# Patient Record
Sex: Male | Born: 1945
Health system: Southern US, Community
[De-identification: ages and names within clinical notes are randomized; demographics above are authoritative.]

## PROBLEM LIST (undated history)

## (undated) DIAGNOSIS — J449 Chronic obstructive pulmonary disease, unspecified: Secondary | ICD-10-CM

## (undated) DIAGNOSIS — E119 Type 2 diabetes mellitus without complications: Secondary | ICD-10-CM

## (undated) DIAGNOSIS — J439 Emphysema, unspecified: Secondary | ICD-10-CM

## (undated) DIAGNOSIS — N400 Enlarged prostate without lower urinary tract symptoms: Secondary | ICD-10-CM

## (undated) DIAGNOSIS — L570 Actinic keratosis: Secondary | ICD-10-CM

## (undated) DIAGNOSIS — D509 Iron deficiency anemia, unspecified: Secondary | ICD-10-CM

## (undated) DIAGNOSIS — E1121 Type 2 diabetes mellitus with diabetic nephropathy: Secondary | ICD-10-CM

## (undated) DIAGNOSIS — J9611 Chronic respiratory failure with hypoxia: Secondary | ICD-10-CM

## (undated) DIAGNOSIS — F419 Anxiety disorder, unspecified: Secondary | ICD-10-CM

## (undated) DIAGNOSIS — Z8489 Family history of other specified conditions: Secondary | ICD-10-CM

## (undated) DIAGNOSIS — J42 Unspecified chronic bronchitis: Secondary | ICD-10-CM

## (undated) DIAGNOSIS — I10 Essential (primary) hypertension: Secondary | ICD-10-CM

## (undated) DIAGNOSIS — K219 Gastro-esophageal reflux disease without esophagitis: Secondary | ICD-10-CM

## (undated) DIAGNOSIS — G4700 Insomnia, unspecified: Secondary | ICD-10-CM

## (undated) DIAGNOSIS — R0902 Hypoxemia: Secondary | ICD-10-CM

## (undated) DIAGNOSIS — E782 Mixed hyperlipidemia: Secondary | ICD-10-CM

## (undated) HISTORY — DX: Hypoxemia: R09.02

## (undated) HISTORY — PX: CHOLECYSTECTOMY: SHX55

## (undated) HISTORY — PX: CARDIAC CATHETERIZATION: SHX172

## (undated) HISTORY — PX: HERNIA REPAIR: SHX51

---

## 2014-03-25 ENCOUNTER — Emergency Department: Payer: Self-pay | Admitting: Emergency Medicine

## 2014-03-25 LAB — COMPREHENSIVE METABOLIC PANEL
ALK PHOS: 87 U/L
ANION GAP: 7 (ref 7–16)
AST: 39 U/L — AB (ref 15–37)
Albumin: 3.7 g/dL (ref 3.4–5.0)
BUN: 10 mg/dL (ref 7–18)
Bilirubin,Total: 0.6 mg/dL (ref 0.2–1.0)
CREATININE: 1.24 mg/dL (ref 0.60–1.30)
Calcium, Total: 9.1 mg/dL (ref 8.5–10.1)
Chloride: 98 mmol/L (ref 98–107)
Co2: 26 mmol/L (ref 21–32)
Glucose: 173 mg/dL — ABNORMAL HIGH (ref 65–99)
Osmolality: 266 (ref 275–301)
POTASSIUM: 4.3 mmol/L (ref 3.5–5.1)
SGPT (ALT): 33 U/L (ref 12–78)
SODIUM: 131 mmol/L — AB (ref 136–145)
Total Protein: 8.1 g/dL (ref 6.4–8.2)

## 2014-03-25 LAB — CBC WITH DIFFERENTIAL/PLATELET
Basophil #: 0 10*3/uL (ref 0.0–0.1)
Basophil %: 0.4 %
EOS ABS: 0 10*3/uL (ref 0.0–0.7)
Eosinophil %: 0.3 %
HCT: 47.1 % (ref 40.0–52.0)
HGB: 15.6 g/dL (ref 13.0–18.0)
LYMPHS ABS: 0.8 10*3/uL — AB (ref 1.0–3.6)
Lymphocyte %: 9.2 %
MCH: 27.8 pg (ref 26.0–34.0)
MCHC: 33 g/dL (ref 32.0–36.0)
MCV: 84 fL (ref 80–100)
MONOS PCT: 10.5 %
Monocyte #: 1 x10 3/mm (ref 0.2–1.0)
NEUTROS ABS: 7.3 10*3/uL — AB (ref 1.4–6.5)
NEUTROS PCT: 79.6 %
PLATELETS: 202 10*3/uL (ref 150–440)
RBC: 5.59 10*6/uL (ref 4.40–5.90)
RDW: 14.4 % (ref 11.5–14.5)
WBC: 9.2 10*3/uL (ref 3.8–10.6)

## 2014-03-25 LAB — TROPONIN I: Troponin-I: <0.02 ng/mL

## 2014-03-26 LAB — URINALYSIS, COMPLETE
BACTERIA: NONE SEEN
Bilirubin,UR: NEGATIVE
Blood: NEGATIVE
Glucose,UR: NEGATIVE mg/dL (ref 0–75)
KETONE: NEGATIVE
Leukocyte Esterase: NEGATIVE
NITRITE: NEGATIVE
PH: 6 (ref 4.5–8.0)
Protein: 100
RBC,UR: <1 /HPF (ref 0–5)
Specific Gravity: 1.017 (ref 1.003–1.030)
Squamous Epithelial: NONE SEEN
WBC UR: <1 /HPF (ref 0–5)

## 2014-03-29 ENCOUNTER — Emergency Department: Payer: Self-pay | Admitting: Emergency Medicine

## 2014-03-29 LAB — CBC WITH DIFFERENTIAL/PLATELET
BASOS ABS: 0 10*3/uL (ref 0.0–0.1)
BASOS PCT: 1 %
EOS ABS: 0.1 10*3/uL (ref 0.0–0.7)
Eosinophil %: 1.3 %
HCT: 45.9 % (ref 40.0–52.0)
HGB: 15.7 g/dL (ref 13.0–18.0)
LYMPHS ABS: 1.2 10*3/uL (ref 1.0–3.6)
Lymphocyte %: 23.6 %
MCH: 28.6 pg (ref 26.0–34.0)
MCHC: 34.1 g/dL (ref 32.0–36.0)
MCV: 84 fL (ref 80–100)
Monocyte #: 0.6 x10 3/mm (ref 0.2–1.0)
Monocyte %: 11.6 %
NEUTROS ABS: 3.2 10*3/uL (ref 1.4–6.5)
Neutrophil %: 62.5 %
PLATELETS: 182 10*3/uL (ref 150–440)
RBC: 5.48 10*6/uL (ref 4.40–5.90)
RDW: 14.2 % (ref 11.5–14.5)
WBC: 5.1 10*3/uL (ref 3.8–10.6)

## 2014-03-29 LAB — COMPREHENSIVE METABOLIC PANEL
Albumin: 3.4 g/dL (ref 3.4–5.0)
Alkaline Phosphatase: 77 U/L
Anion Gap: 6 — ABNORMAL LOW (ref 7–16)
BILIRUBIN TOTAL: 0.3 mg/dL (ref 0.2–1.0)
BUN: 23 mg/dL — ABNORMAL HIGH (ref 7–18)
CHLORIDE: 95 mmol/L — AB (ref 98–107)
CREATININE: 2.2 mg/dL — AB (ref 0.60–1.30)
Calcium, Total: 9.2 mg/dL (ref 8.5–10.1)
Co2: 27 mmol/L (ref 21–32)
EGFR (African American): 35 — ABNORMAL LOW
GFR CALC NON AF AMER: 30 — AB
GLUCOSE: 92 mg/dL (ref 65–99)
OSMOLALITY: 260 (ref 275–301)
Potassium: 3.5 mmol/L (ref 3.5–5.1)
SGOT(AST): 40 U/L — ABNORMAL HIGH (ref 15–37)
SGPT (ALT): 32 U/L (ref 12–78)
SODIUM: 128 mmol/L — AB (ref 136–145)
TOTAL PROTEIN: 8.5 g/dL — AB (ref 6.4–8.2)

## 2014-03-29 LAB — URINALYSIS, COMPLETE
Bilirubin,UR: NEGATIVE
Blood: NEGATIVE
GLUCOSE, UR: NEGATIVE mg/dL (ref 0–75)
Hyaline Cast: 2
KETONE: NEGATIVE
Leukocyte Esterase: NEGATIVE
Nitrite: NEGATIVE
Ph: 5 (ref 4.5–8.0)
SPECIFIC GRAVITY: 1.028 (ref 1.003–1.030)
SQUAMOUS EPITHELIAL: NONE SEEN
WBC UR: 4 /HPF (ref 0–5)

## 2014-03-29 LAB — TROPONIN I: Troponin-I: <0.02 ng/mL

## 2014-04-24 DIAGNOSIS — N181 Chronic kidney disease, stage 1: Secondary | ICD-10-CM | POA: Insufficient documentation

## 2014-08-30 ENCOUNTER — Ambulatory Visit: Payer: Self-pay

## 2014-09-18 ENCOUNTER — Ambulatory Visit: Payer: Self-pay

## 2014-10-19 ENCOUNTER — Ambulatory Visit: Payer: Self-pay

## 2014-11-19 ENCOUNTER — Ambulatory Visit: Payer: Self-pay

## 2014-12-18 ENCOUNTER — Ambulatory Visit: Admit: 2014-12-18 | Disposition: A | Discharge status: Home / Self Care | Payer: Self-pay

## 2015-07-26 ENCOUNTER — Encounter: Payer: Self-pay | Admitting: *Deleted

## 2015-12-16 DIAGNOSIS — N401 Enlarged prostate with lower urinary tract symptoms: Secondary | ICD-10-CM

## 2015-12-16 DIAGNOSIS — N138 Other obstructive and reflux uropathy: Secondary | ICD-10-CM | POA: Insufficient documentation

## 2016-03-01 ENCOUNTER — Emergency Department
Admission: EM | Admit: 2016-03-01 | Discharge: 2016-03-01 | Disposition: A | Discharge status: Home / Self Care | Payer: Medicare Other | Attending: Emergency Medicine | Admitting: Emergency Medicine

## 2016-03-01 ENCOUNTER — Encounter: Payer: Self-pay | Admitting: Emergency Medicine

## 2016-03-01 ENCOUNTER — Emergency Department: Payer: Medicare Other

## 2016-03-01 DIAGNOSIS — R05 Cough: Secondary | ICD-10-CM | POA: Diagnosis present

## 2016-03-01 DIAGNOSIS — J449 Chronic obstructive pulmonary disease, unspecified: Secondary | ICD-10-CM | POA: Diagnosis not present

## 2016-03-01 DIAGNOSIS — J189 Pneumonia, unspecified organism: Secondary | ICD-10-CM | POA: Diagnosis not present

## 2016-03-01 DIAGNOSIS — E119 Type 2 diabetes mellitus without complications: Secondary | ICD-10-CM | POA: Insufficient documentation

## 2016-03-01 HISTORY — DX: Type 2 diabetes mellitus without complications: E11.9

## 2016-03-01 HISTORY — DX: Chronic obstructive pulmonary disease, unspecified: J44.9

## 2016-03-01 LAB — BASIC METABOLIC PANEL
Anion gap: 11 (ref 5–15)
BUN: 11 mg/dL (ref 6–20)
CALCIUM: 8.9 mg/dL (ref 8.9–10.3)
CO2: 26 mmol/L (ref 22–32)
CREATININE: 1.03 mg/dL (ref 0.61–1.24)
Chloride: 92 mmol/L — ABNORMAL LOW (ref 101–111)
GFR calc Af Amer: >60 mL/min (ref >60)
GFR calc non Af Amer: >60 mL/min (ref >60)
GLUCOSE: 235 mg/dL — AB (ref 65–99)
Potassium: 4 mmol/L (ref 3.5–5.1)
Sodium: 129 mmol/L — ABNORMAL LOW (ref 135–145)

## 2016-03-01 LAB — TROPONIN I

## 2016-03-01 LAB — CBC
HCT: 43.2 % (ref 40.0–52.0)
HEMOGLOBIN: 14.6 g/dL (ref 13.0–18.0)
MCH: 26.9 pg (ref 26.0–34.0)
MCHC: 33.8 g/dL (ref 32.0–36.0)
MCV: 79.5 fL — ABNORMAL LOW (ref 80.0–100.0)
PLATELETS: 204 10*3/uL (ref 150–440)
RBC: 5.43 MIL/uL (ref 4.40–5.90)
RDW: 14.4 % (ref 11.5–14.5)
WBC: 11.4 10*3/uL — ABNORMAL HIGH (ref 3.8–10.6)

## 2016-03-01 MED ORDER — AZITHROMYCIN 250 MG PO TABS: ORAL_TABLET | ORAL | Status: AC

## 2016-03-01 MED ORDER — AZITHROMYCIN 500 MG PO TABS
500.0000 mg | ORAL_TABLET | Freq: Once | ORAL | Status: AC
  Administered 2016-03-01: 500 mg via ORAL
  Filled 2016-03-01: qty 1

## 2016-03-01 MED ORDER — ALBUTEROL SULFATE (2.5 MG/3ML) 0.083% IN NEBU: 2.5000 mg | INHALATION_SOLUTION | RESPIRATORY_TRACT | Status: DC | PRN

## 2016-03-01 NOTE — ED Notes (Signed)
Patient arrives to [facility] ED with complaint of chest pain/pressure with shortness of breath. Patient c/o nasal congestion with productive green sputum cough. Patient actively expectorating in triage

## 2016-03-01 NOTE — ED Notes (Signed)
Pt to xray

## 2016-03-01 NOTE — Discharge Instructions (Signed)

## 2016-03-01 NOTE — ED Provider Notes (Signed)
[facility] Emergency Department Provider Note   ____________________________________________  Time seen: Approximately 445 PM  I have reviewed the triage vital signs and the nursing notes.   HISTORY  Chief Complaint Chest Pain; Cough; and Nasal Congestion   HPI Billy Wise is a 70 y.o. male with a history of COPD and diabetes who is presenting to the emergency department today with 2 days of worsening cough, nasal congestion and a fever yesterday. He says he has not had any fevers today. Denies any shortness of breath. Says that he has no longer smoking. Has had a pneumonia in the past which she took in about [facility] which resolved. He says that his wife is also sick with upper respiratory infection about one week ago. He says he is also having central chest pain which feels like is a pressure type pain and is moderate and constant. He does not report any radiation of the pain. Says that he has also had several episodes of posttussive emesis.Denies being admitted to the hospital over the past 3 months.   Past Medical History  Diagnosis Date  . COPD (chronic obstructive pulmonary disease) (HCC)   . Diabetes mellitus without complication (HCC)     There are no active problems to display for this patient.   History reviewed. No pertinent past surgical history.  No current outpatient prescriptions on file.  Allergies Review of patient's allergies indicates no known allergies.  History reviewed. No pertinent family history.  Social History Social History  Substance Use Topics  . Smoking status: Never Smoker   . Smokeless tobacco: None  . Alcohol Use: None    Review of Systems Constitutional: Fever yesterday Eyes: No visual changes. ENT: No sore throat. Cardiovascular: Denies chest pain. Respiratory: Cough and nasal congestion Gastrointestinal: No diarrhea.  No constipation. Genitourinary: Negative for dysuria. Musculoskeletal:  Negative for back pain. Skin: Negative for rash. Neurological: Negative for headaches, focal weakness or numbness.  10-point ROS otherwise negative.  ____________________________________________   PHYSICAL EXAM:  VITAL SIGNS: ED Triage Vitals  Enc Vitals Group     BP 03/01/16 1553 118/72 mmHg     Pulse Rate 03/01/16 1553 94     Resp --      Temp 03/01/16 1553 98.9 F (37.2 C)     Temp Source 03/01/16 1553 Oral     SpO2 03/01/16 1553 97 %     Weight 03/01/16 1553 180 lb (81.647 kg)     Height 03/01/16 1553 5\' 6"  (1.676 m)     Head Cir --      Peak Flow --      Pain Score 03/01/16 1554 9     Pain Loc --      Pain Edu? --      Excl. in GC? --     Constitutional: Alert and oriented. Well appearing and in no acute distress. Eyes: Conjunctivae are normal. PERRL. EOMI. Head: Atraumatic. Nose: No congestion/rhinnorhea. Mouth/Throat: Mucous membranes are moist.  Oropharynx non-erythematous. Neck: No stridor.   Cardiovascular: Normal rate, regular rhythm. Grossly normal heart sounds.  Good peripheral circulation.Chest pain is reproducible over the sternum. Respiratory: Normal respiratory effort.  No retractions. Mild rales to the left mid field. Gastrointestinal: Soft with mild tenderness diffusely. no CVA tenderness. Musculoskeletal: No lower extremity tenderness nor edema.  No joint effusions. Neurologic:  Normal speech and language. No gross focal neurologic deficits are appreciated.  Skin:  Skin is warm, dry and intact. No rash noted.  Psychiatric: Mood and affect are normal. Speech and behavior are normal.  ____________________________________________   LABS (all labs ordered are listed, but only abnormal results are displayed)  Labs Reviewed  BASIC METABOLIC PANEL - Abnormal; Notable for the following:    Sodium 129 (*)    Chloride 92 (*)    Glucose, Bld 235 (*)    All other components within normal limits  CBC - Abnormal; Notable for the following:    WBC 11.4 (*)     MCV 79.5 (*)    All other components within normal limits  TROPONIN I   ____________________________________________  EKG  ED ECG REPORT I, Arelia Longest, the attending physician, personally viewed and interpreted this ECG.   Date: 03/01/2016  EKG Time: 1545  Rate: 94  Rhythm: normal sinus rhythm  Axis: Left axis deviation  Intervals:none  ST&T Change: No ST segment elevation or depression. No abnormal T-wave inversion.  ____________________________________________  RADIOLOGY  DG Chest 2 View (Final result) Result time: 03/01/16 16:16:40   Final result by Rad Results In Interface (03/01/16 16:16:40)   Narrative:   CLINICAL DATA: Productive cough, nonsmoker, fever  EXAM: CHEST 2 VIEW  COMPARISON: None.  FINDINGS: There is hazy lingular airspace disease which may reflect atelectasis versus pneumonia. There is no focal parenchymal opacity. There is no pleural effusion or pneumothorax. The heart and mediastinal contours are unremarkable.  The osseous structures are unremarkable.  IMPRESSION: Hazy lingular airspace disease which may reflect atelectasis versus pneumonia.   Electronically Signed By: Elige Ko    ____________________________________________   PROCEDURES   ____________________________________________   INITIAL IMPRESSION / ASSESSMENT AND PLAN / ED COURSE  Pertinent labs & imaging results that were available during my care of the patient were reviewed by me and considered in my medical decision making (see chart for details).  We'll give patient antibiotics for his pneumonia. Patient says he has nebulizer machine at home but he says his Nebules may be out of date. We'll prescribe albuterol Nebules for home use as needed. Also discussed return precautions including any worsening symptoms such as return of fever or shortness of breath. The patient understands these return precautions and is willing to comply. Will be discharged  home. ____________________________________________   FINAL CLINICAL IMPRESSION(S) / ED DIAGNOSES  Community acquired pneumonia.    NEW MEDICATIONS STARTED DURING THIS VISIT:  New Prescriptions   No medications on file     Note:  This document was prepared using Dragon voice recognition software and may include unintentional dictation errors.    Myrna Blazer, MD 03/01/16 [phone]

## 2016-03-23 LAB — HM HEPATITIS C SCREENING LAB: HM HEPATITIS C SCREENING: NEGATIVE

## 2016-09-03 DIAGNOSIS — G4700 Insomnia, unspecified: Secondary | ICD-10-CM | POA: Insufficient documentation

## 2017-10-28 ENCOUNTER — Other Ambulatory Visit: Payer: Self-pay

## 2017-10-28 ENCOUNTER — Encounter: Payer: Self-pay | Admitting: Emergency Medicine

## 2017-10-28 ENCOUNTER — Inpatient Hospital Stay
Admission: EM | Admit: 2017-10-28 | Discharge: 2017-11-01 | DRG: 871 | Disposition: A | Discharge status: Home / Self Care | Payer: Medicare Other | Attending: Internal Medicine | Admitting: Internal Medicine

## 2017-10-28 ENCOUNTER — Emergency Department: Payer: Medicare Other

## 2017-10-28 DIAGNOSIS — N181 Chronic kidney disease, stage 1: Secondary | ICD-10-CM | POA: Diagnosis present

## 2017-10-28 DIAGNOSIS — G4733 Obstructive sleep apnea (adult) (pediatric): Secondary | ICD-10-CM | POA: Diagnosis present

## 2017-10-28 DIAGNOSIS — J101 Influenza due to other identified influenza virus with other respiratory manifestations: Secondary | ICD-10-CM

## 2017-10-28 DIAGNOSIS — I152 Hypertension secondary to endocrine disorders: Secondary | ICD-10-CM | POA: Diagnosis present

## 2017-10-28 DIAGNOSIS — J1008 Influenza due to other identified influenza virus with other specified pneumonia: Secondary | ICD-10-CM | POA: Diagnosis present

## 2017-10-28 DIAGNOSIS — Z8249 Family history of ischemic heart disease and other diseases of the circulatory system: Secondary | ICD-10-CM | POA: Diagnosis not present

## 2017-10-28 DIAGNOSIS — E871 Hypo-osmolality and hyponatremia: Secondary | ICD-10-CM | POA: Diagnosis present

## 2017-10-28 DIAGNOSIS — Z7951 Long term (current) use of inhaled steroids: Secondary | ICD-10-CM

## 2017-10-28 DIAGNOSIS — J441 Chronic obstructive pulmonary disease with (acute) exacerbation: Secondary | ICD-10-CM | POA: Diagnosis present

## 2017-10-28 DIAGNOSIS — I129 Hypertensive chronic kidney disease with stage 1 through stage 4 chronic kidney disease, or unspecified chronic kidney disease: Secondary | ICD-10-CM | POA: Diagnosis present

## 2017-10-28 DIAGNOSIS — J159 Unspecified bacterial pneumonia: Secondary | ICD-10-CM | POA: Diagnosis present

## 2017-10-28 DIAGNOSIS — E1159 Type 2 diabetes mellitus with other circulatory complications: Secondary | ICD-10-CM | POA: Diagnosis present

## 2017-10-28 DIAGNOSIS — R0902 Hypoxemia: Secondary | ICD-10-CM

## 2017-10-28 DIAGNOSIS — A4189 Other specified sepsis: Principal | ICD-10-CM | POA: Diagnosis present

## 2017-10-28 DIAGNOSIS — K219 Gastro-esophageal reflux disease without esophagitis: Secondary | ICD-10-CM | POA: Diagnosis present

## 2017-10-28 DIAGNOSIS — N400 Enlarged prostate without lower urinary tract symptoms: Secondary | ICD-10-CM | POA: Diagnosis present

## 2017-10-28 DIAGNOSIS — I10 Essential (primary) hypertension: Secondary | ICD-10-CM | POA: Diagnosis present

## 2017-10-28 DIAGNOSIS — Z794 Long term (current) use of insulin: Secondary | ICD-10-CM | POA: Diagnosis not present

## 2017-10-28 DIAGNOSIS — Z79899 Other long term (current) drug therapy: Secondary | ICD-10-CM | POA: Diagnosis not present

## 2017-10-28 DIAGNOSIS — E1165 Type 2 diabetes mellitus with hyperglycemia: Secondary | ICD-10-CM | POA: Diagnosis present

## 2017-10-28 DIAGNOSIS — J129 Viral pneumonia, unspecified: Secondary | ICD-10-CM | POA: Diagnosis present

## 2017-10-28 DIAGNOSIS — E1122 Type 2 diabetes mellitus with diabetic chronic kidney disease: Secondary | ICD-10-CM | POA: Diagnosis present

## 2017-10-28 DIAGNOSIS — J440 Chronic obstructive pulmonary disease with acute lower respiratory infection: Secondary | ICD-10-CM | POA: Diagnosis present

## 2017-10-28 DIAGNOSIS — A419 Sepsis, unspecified organism: Secondary | ICD-10-CM | POA: Diagnosis present

## 2017-10-28 DIAGNOSIS — E782 Mixed hyperlipidemia: Secondary | ICD-10-CM | POA: Diagnosis present

## 2017-10-28 DIAGNOSIS — J449 Chronic obstructive pulmonary disease, unspecified: Secondary | ICD-10-CM

## 2017-10-28 DIAGNOSIS — E119 Type 2 diabetes mellitus without complications: Secondary | ICD-10-CM

## 2017-10-28 DIAGNOSIS — J9601 Acute respiratory failure with hypoxia: Secondary | ICD-10-CM | POA: Diagnosis present

## 2017-10-28 DIAGNOSIS — R05 Cough: Secondary | ICD-10-CM

## 2017-10-28 DIAGNOSIS — R059 Cough, unspecified: Secondary | ICD-10-CM

## 2017-10-28 DIAGNOSIS — G4700 Insomnia, unspecified: Secondary | ICD-10-CM | POA: Diagnosis present

## 2017-10-28 DIAGNOSIS — D509 Iron deficiency anemia, unspecified: Secondary | ICD-10-CM | POA: Diagnosis present

## 2017-10-28 HISTORY — DX: Actinic keratosis: L57.0

## 2017-10-28 HISTORY — DX: Benign prostatic hyperplasia without lower urinary tract symptoms: N40.0

## 2017-10-28 HISTORY — DX: Essential (primary) hypertension: I10

## 2017-10-28 HISTORY — DX: Gastro-esophageal reflux disease without esophagitis: K21.9

## 2017-10-28 HISTORY — DX: Mixed hyperlipidemia: E78.2

## 2017-10-28 HISTORY — DX: Iron deficiency anemia, unspecified: D50.9

## 2017-10-28 HISTORY — DX: Insomnia, unspecified: G47.00

## 2017-10-28 LAB — CBC WITH DIFFERENTIAL/PLATELET
BASOS ABS: 0.1 10*3/uL (ref 0–0.1)
BASOS PCT: 1 %
Eosinophils Absolute: 0 10*3/uL (ref 0–0.7)
Eosinophils Relative: 0 %
HCT: 36.7 % — ABNORMAL LOW (ref 40.0–52.0)
HEMOGLOBIN: 11.8 g/dL — AB (ref 13.0–18.0)
LYMPHS PCT: 5 %
Lymphs Abs: 0.6 10*3/uL — ABNORMAL LOW (ref 1.0–3.6)
MCH: 25.1 pg — ABNORMAL LOW (ref 26.0–34.0)
MCHC: 32.2 g/dL (ref 32.0–36.0)
MCV: 78.1 fL — ABNORMAL LOW (ref 80.0–100.0)
Monocytes Absolute: 0.4 10*3/uL (ref 0.2–1.0)
Monocytes Relative: 3 %
Neutro Abs: 11 10*3/uL — ABNORMAL HIGH (ref 1.4–6.5)
Neutrophils Relative %: 91 %
Platelets: 338 10*3/uL (ref 150–440)
RBC: 4.7 MIL/uL (ref 4.40–5.90)
RDW: 15.8 % — ABNORMAL HIGH (ref 11.5–14.5)
WBC: 12.2 10*3/uL — AB (ref 3.8–10.6)

## 2017-10-28 LAB — LACTIC ACID, PLASMA: Lactic Acid, Venous: 1.8 mmol/L (ref 0.5–1.9)

## 2017-10-28 LAB — COMPREHENSIVE METABOLIC PANEL
ALBUMIN: 3.3 g/dL — AB (ref 3.5–5.0)
ALK PHOS: 68 U/L (ref 38–126)
ALT: 14 U/L — AB (ref 17–63)
AST: 23 U/L (ref 15–41)
Anion gap: 10 (ref 5–15)
BILIRUBIN TOTAL: 0.6 mg/dL (ref 0.3–1.2)
BUN: 15 mg/dL (ref 6–20)
CO2: 22 mmol/L (ref 22–32)
CREATININE: 0.96 mg/dL (ref 0.61–1.24)
Calcium: 8.1 mg/dL — ABNORMAL LOW (ref 8.9–10.3)
Chloride: 96 mmol/L — ABNORMAL LOW (ref 101–111)
GFR calc Af Amer: >60 mL/min (ref >60)
GFR calc non Af Amer: >60 mL/min (ref >60)
GLUCOSE: 301 mg/dL — AB (ref 65–99)
POTASSIUM: 5.1 mmol/L (ref 3.5–5.1)
Sodium: 128 mmol/L — ABNORMAL LOW (ref 135–145)
TOTAL PROTEIN: 7.9 g/dL (ref 6.5–8.1)

## 2017-10-28 LAB — INFLUENZA PANEL BY PCR (TYPE A & B)
Influenza A By PCR: POSITIVE — AB
Influenza B By PCR: NEGATIVE

## 2017-10-28 LAB — GLUCOSE, CAPILLARY
GLUCOSE-CAPILLARY: 523 mg/dL — AB (ref 65–99)
Glucose-Capillary: 533 mg/dL (ref 65–99)

## 2017-10-28 LAB — TROPONIN I: Troponin I: 0.03 ng/mL (ref <0.03)

## 2017-10-28 MED ORDER — SODIUM CHLORIDE 0.9 % IV BOLUS (SEPSIS)
1000.0000 mL | Freq: Once | INTRAVENOUS | Status: AC
  Administered 2017-10-28: 1000 mL via INTRAVENOUS

## 2017-10-28 MED ORDER — INSULIN DETEMIR 100 UNIT/ML FLEXPEN: 77.0000 [IU] | PEN_INJECTOR | Freq: Every day | SUBCUTANEOUS | Status: DC

## 2017-10-28 MED ORDER — IPRATROPIUM-ALBUTEROL 0.5-2.5 (3) MG/3ML IN SOLN
RESPIRATORY_TRACT | Status: AC
  Administered 2017-10-28: 3 mL via RESPIRATORY_TRACT
  Filled 2017-10-28: qty 9

## 2017-10-28 MED ORDER — INSULIN REGULAR HUMAN 100 UNIT/ML IJ SOLN
10.0000 [IU] | Freq: Once | INTRAMUSCULAR | Status: AC
  Administered 2017-10-29: 10 [IU] via INTRAVENOUS
  Filled 2017-10-28: qty 0.1

## 2017-10-28 MED ORDER — IPRATROPIUM-ALBUTEROL 0.5-2.5 (3) MG/3ML IN SOLN
3.0000 mL | Freq: Once | RESPIRATORY_TRACT | Status: AC
  Administered 2017-10-28: 3 mL via RESPIRATORY_TRACT

## 2017-10-28 MED ORDER — AMLODIPINE BESYLATE 10 MG PO TABS
10.0000 mg | ORAL_TABLET | Freq: Every day | ORAL | Status: DC
  Administered 2017-10-29 – 2017-11-01 (×4): 10 mg via ORAL
  Filled 2017-10-28 (×4): qty 1

## 2017-10-28 MED ORDER — DEXTROSE 5 % IV SOLN
500.0000 mg | INTRAVENOUS | Status: DC
  Filled 2017-10-28: qty 500

## 2017-10-28 MED ORDER — ONDANSETRON HCL 4 MG/2ML IJ SOLN: 4.0000 mg | Freq: Four times a day (QID) | INTRAMUSCULAR | Status: DC | PRN

## 2017-10-28 MED ORDER — IPRATROPIUM-ALBUTEROL 0.5-2.5 (3) MG/3ML IN SOLN
3.0000 mL | Freq: Four times a day (QID) | RESPIRATORY_TRACT | Status: DC | PRN
  Administered 2017-10-28: 3 mL via RESPIRATORY_TRACT
  Filled 2017-10-28: qty 3

## 2017-10-28 MED ORDER — SIMVASTATIN 20 MG PO TABS
20.0000 mg | ORAL_TABLET | Freq: Every evening | ORAL | Status: DC
  Administered 2017-10-29 – 2017-10-31 (×3): 20 mg via ORAL
  Filled 2017-10-28 (×4): qty 1

## 2017-10-28 MED ORDER — IPRATROPIUM-ALBUTEROL 0.5-2.5 (3) MG/3ML IN SOLN
3.0000 mL | Freq: Four times a day (QID) | RESPIRATORY_TRACT | Status: DC
  Administered 2017-10-29 – 2017-11-01 (×13): 3 mL via RESPIRATORY_TRACT
  Filled 2017-10-28 (×14): qty 3

## 2017-10-28 MED ORDER — TAMSULOSIN HCL 0.4 MG PO CAPS
0.4000 mg | ORAL_CAPSULE | Freq: Every day | ORAL | Status: DC
  Administered 2017-10-29 – 2017-11-01 (×4): 0.4 mg via ORAL
  Filled 2017-10-28 (×4): qty 1

## 2017-10-28 MED ORDER — ENOXAPARIN SODIUM 40 MG/0.4ML ~~LOC~~ SOLN
40.0000 mg | SUBCUTANEOUS | Status: DC
  Administered 2017-10-29 – 2017-10-31 (×3): 40 mg via SUBCUTANEOUS
  Filled 2017-10-28 (×3): qty 0.4

## 2017-10-28 MED ORDER — DEXTROSE 5 % IV SOLN
1.0000 g | Freq: Once | INTRAVENOUS | Status: AC
  Administered 2017-10-29: 1 g via INTRAVENOUS
  Filled 2017-10-28: qty 10

## 2017-10-28 MED ORDER — METHYLPREDNISOLONE SODIUM SUCC 125 MG IJ SOLR
60.0000 mg | Freq: Four times a day (QID) | INTRAMUSCULAR | Status: DC
  Administered 2017-10-29 (×2): 60 mg via INTRAVENOUS
  Filled 2017-10-28 (×2): qty 2

## 2017-10-28 MED ORDER — INSULIN ASPART 100 UNIT/ML ~~LOC~~ SOLN
0.0000 [IU] | Freq: Three times a day (TID) | SUBCUTANEOUS | Status: DC
  Administered 2017-10-29 (×3): 7 [IU] via SUBCUTANEOUS
  Administered 2017-10-29: 5 [IU] via SUBCUTANEOUS
  Administered 2017-10-30: 9 [IU] via SUBCUTANEOUS
  Administered 2017-10-30: 3 [IU] via SUBCUTANEOUS
  Administered 2017-10-30: 9 [IU] via SUBCUTANEOUS
  Administered 2017-10-30: 3 [IU] via SUBCUTANEOUS
  Administered 2017-10-31: 9 [IU] via SUBCUTANEOUS
  Administered 2017-10-31: 7 [IU] via SUBCUTANEOUS
  Administered 2017-10-31: 9 [IU] via SUBCUTANEOUS
  Filled 2017-10-28 (×10): qty 1

## 2017-10-28 MED ORDER — METHYLPREDNISOLONE SODIUM SUCC 125 MG IJ SOLR
INTRAMUSCULAR | Status: AC
  Administered 2017-10-28: 125 mg via INTRAVENOUS
  Filled 2017-10-28: qty 2

## 2017-10-28 MED ORDER — MOMETASONE FURO-FORMOTEROL FUM 100-5 MCG/ACT IN AERO
2.0000 | INHALATION_SPRAY | Freq: Two times a day (BID) | RESPIRATORY_TRACT | Status: DC
  Administered 2017-10-29 – 2017-10-30 (×4): 2 via RESPIRATORY_TRACT
  Filled 2017-10-28: qty 8.8

## 2017-10-28 MED ORDER — INSULIN DETEMIR 100 UNIT/ML ~~LOC~~ SOLN
77.0000 [IU] | Freq: Every day | SUBCUTANEOUS | Status: DC
  Administered 2017-10-29 (×2): 77 [IU] via SUBCUTANEOUS
  Filled 2017-10-28 (×3): qty 0.77

## 2017-10-28 MED ORDER — AZITHROMYCIN 500 MG IV SOLR
500.0000 mg | Freq: Once | INTRAVENOUS | Status: AC
  Administered 2017-10-28: 500 mg via INTRAVENOUS

## 2017-10-28 MED ORDER — DEXTROSE 5 % IV SOLN
1.0000 g | INTRAVENOUS | Status: DC
  Filled 2017-10-28: qty 10

## 2017-10-28 MED ORDER — FINASTERIDE 5 MG PO TABS
5.0000 mg | ORAL_TABLET | Freq: Every day | ORAL | Status: DC
  Administered 2017-10-29 – 2017-11-01 (×4): 5 mg via ORAL
  Filled 2017-10-28 (×4): qty 1

## 2017-10-28 MED ORDER — ACETAMINOPHEN 325 MG PO TABS: 650.0000 mg | ORAL_TABLET | Freq: Four times a day (QID) | ORAL | Status: DC | PRN

## 2017-10-28 MED ORDER — DEXTROSE 5 % IV SOLN
INTRAVENOUS | Status: AC
  Administered 2017-10-28: 500 mg via INTRAVENOUS
  Filled 2017-10-28: qty 500

## 2017-10-28 MED ORDER — ACETAMINOPHEN 650 MG RE SUPP: 650.0000 mg | Freq: Four times a day (QID) | RECTAL | Status: DC | PRN

## 2017-10-28 MED ORDER — QUINAPRIL HCL 10 MG PO TABS: 40.0000 mg | ORAL_TABLET | Freq: Every day | ORAL | Status: DC

## 2017-10-28 MED ORDER — ONDANSETRON HCL 4 MG PO TABS: 4.0000 mg | ORAL_TABLET | Freq: Four times a day (QID) | ORAL | Status: DC | PRN

## 2017-10-28 MED ORDER — LISINOPRIL 20 MG PO TABS
40.0000 mg | ORAL_TABLET | Freq: Every day | ORAL | Status: DC
  Administered 2017-10-29 – 2017-11-01 (×4): 40 mg via ORAL
  Filled 2017-10-28 (×4): qty 2

## 2017-10-28 MED ORDER — ACETAMINOPHEN 500 MG PO TABS
1000.0000 mg | ORAL_TABLET | Freq: Once | ORAL | Status: AC
  Administered 2017-10-28: 1000 mg via ORAL

## 2017-10-28 MED ORDER — TRAZODONE HCL 50 MG PO TABS
50.0000 mg | ORAL_TABLET | Freq: Every day | ORAL | Status: DC
  Administered 2017-10-29 – 2017-10-31 (×4): 50 mg via ORAL
  Filled 2017-10-28 (×4): qty 1

## 2017-10-28 MED ORDER — PANTOPRAZOLE SODIUM 40 MG PO TBEC
40.0000 mg | DELAYED_RELEASE_TABLET | Freq: Every day | ORAL | Status: DC
  Administered 2017-10-29 – 2017-11-01 (×4): 40 mg via ORAL
  Filled 2017-10-28 (×4): qty 1

## 2017-10-28 MED ORDER — GUAIFENESIN-DM 100-10 MG/5ML PO SYRP
5.0000 mL | ORAL_SOLUTION | ORAL | Status: DC | PRN
  Administered 2017-10-29 (×2): 5 mL via ORAL
  Filled 2017-10-28 (×2): qty 5

## 2017-10-28 MED ORDER — TIOTROPIUM BROMIDE MONOHYDRATE 18 MCG IN CAPS
18.0000 ug | ORAL_CAPSULE | Freq: Every day | RESPIRATORY_TRACT | Status: DC
  Administered 2017-10-29 – 2017-11-01 (×4): 18 ug via RESPIRATORY_TRACT
  Filled 2017-10-28: qty 5

## 2017-10-28 MED ORDER — SODIUM CHLORIDE 0.9 % IV SOLN
INTRAVENOUS | Status: AC
  Administered 2017-10-29: via INTRAVENOUS

## 2017-10-28 MED ORDER — ACETAMINOPHEN 500 MG PO TABS
ORAL_TABLET | ORAL | Status: AC
  Administered 2017-10-28: 1000 mg via ORAL
  Filled 2017-10-28: qty 2

## 2017-10-28 MED ORDER — OXYBUTYNIN CHLORIDE ER 5 MG PO TB24
10.0000 mg | ORAL_TABLET | Freq: Every day | ORAL | Status: DC
  Administered 2017-10-29 – 2017-11-01 (×4): 10 mg via ORAL
  Filled 2017-10-28 (×4): qty 2

## 2017-10-28 MED ORDER — METHYLPREDNISOLONE SODIUM SUCC 125 MG IJ SOLR
125.0000 mg | Freq: Once | INTRAMUSCULAR | Status: AC
  Administered 2017-10-28: 125 mg via INTRAVENOUS

## 2017-10-28 NOTE — ED Notes (Signed)
Pt requesting food. Admitting doctor ordering Carb modified diet for pt. Daughter is currently getting pt food

## 2017-10-28 NOTE — ED Provider Notes (Signed)
[facility] Emergency Department Provider Note  ____________________________________________   I have reviewed the triage vital signs and the nursing notes.   HISTORY  Chief Complaint Cough   History limited by: Not Limited   HPI Billy Wise is a 72 y.o. male who presents to the emergency department today because of continued cough and shortness of breath.  DURATION:roughly 10 days TIMING: constant, worsening SEVERITY: severe QUALITY: intermittently productive cough CONTEXT: patient states he was diagnosed with influenza a roughly 8 days ago. States that he has not felt great since then. For the past four days he has been feeling increasingly worse. He has had frequent cough. Has not been sleeping. MODIFYING FACTORS: symptoms worse with cough ASSOCIATED SYMPTOMS: chest pain with the cough  Per medical record review patient has a history of COPD.  Past Medical History:  Diagnosis Date  . Actinic keratoses   . BPH (benign prostatic hyperplasia)   . CKD stage 1 due to type 2 diabetes mellitus (HCC)   . COPD (chronic obstructive pulmonary disease) (HCC)   . Diabetes mellitus without complication (HCC)   . GERD (gastroesophageal reflux disease)   . Hypertension   . Insomnia   . Iron deficiency anemia   . Mixed hyperlipidemia   . OSA (obstructive sleep apnea)     There are no active problems to display for this patient.   Past Surgical History:  Procedure Laterality Date  . CARDIAC CATHETERIZATION      Prior to Admission medications   Medication Sig Start Date End Date Taking? Authorizing Provider  albuterol (PROVENTIL) (2.5 MG/3ML) 0.083% nebulizer solution Take 3 mLs (2.5 mg total) by nebulization every 4 (four) hours as needed for wheezing or shortness of breath. 03/01/16   Schaevitz, Myra Rude, MD    Allergies Patient has no known allergies.  History reviewed. No pertinent family history.  Social History Social History    Tobacco Use  . Smoking status: Never Smoker  . Smokeless tobacco: Never Used  Substance Use Topics  . Alcohol use: No    Frequency: Never  . Drug use: No    Review of Systems Constitutional: Positive for fever. Eyes: No visual changes. ENT: No sore throat. Cardiovascular: Positive for chest pain. Respiratory: Positive for shortness of breath and cough. Gastrointestinal: No abdominal pain.  No nausea, no vomiting.  No diarrhea.   Genitourinary: Negative for dysuria. Musculoskeletal: Negative for back pain. Skin: Negative for rash. Neurological: Negative for headaches, focal weakness or numbness.  ____________________________________________   PHYSICAL EXAM:  VITAL SIGNS: ED Triage Vitals  Enc Vitals Group     BP 10/28/17 1608 (!) 155/75     Pulse Rate 10/28/17 1608 (!) 109     Resp 10/28/17 1608 (!) 24     Temp 10/28/17 1608 98.9 F (37.2 C)     Temp Source 10/28/17 1608 Oral     SpO2 10/28/17 1608 90 %     Weight 10/28/17 1608 175 lb (79.4 kg)     Height 10/28/17 1608 5\' 5"  (1.651 m)     Head Circumference --      Peak Flow --      Pain Score 10/28/17 1717 0   Constitutional: Alert and oriented. Well appearing and in no distress. Eyes: Conjunctivae are normal.  ENT   Head: Normocephalic and atraumatic.   Nose: No congestion/rhinnorhea.   Mouth/Throat: Mucous membranes are moist.   Neck: No stridor. Hematological/Lymphatic/Immunilogical: No cervical lymphadenopathy. Cardiovascular: Tachycardic, regular rhythm.  No murmurs,  rubs, or gallops.  Respiratory: Increased respiratory effort. Diffuse expiratory wheezing. Gastrointestinal: Soft and non tender. No rebound. No guarding.  Genitourinary: Deferred Musculoskeletal: Normal range of motion in all extremities. No lower extremity edema. Neurologic:  Normal speech and language. No gross focal neurologic deficits are appreciated.  Skin:  Skin is warm, dry and intact. No rash noted. Psychiatric: Mood  and affect are normal. Speech and behavior are normal. Patient exhibits appropriate insight and judgment.  ____________________________________________    LABS (pertinent positives/negatives)  CBC wbc 12.2, hgb 11.8, plt 338 CMP na 128, glu 301, cr 0.96 Trop 0.03 Influenza A positive  ____________________________________________   EKG  I, Marybeth Dandy GoodmPhineas Semenan, attending physician, personally viewed and interpreted this EKG  EKG Time: 1601 Rate: 117 Rhythm: sinus tachycardia Axis: left axis deviation Intervals: qtc 398 QRS: narrow ST changes: no st elevation Impression: abnormal ekg   ____________________________________________    RADIOLOGY  CXR Bilateral lower lung bronchial thickening  ____________________________________________   PROCEDURES  Procedures  ____________________________________________   INITIAL IMPRESSION / ASSESSMENT AND PLAN / ED COURSE  Pertinent labs & imaging results that were available during my care of the patient were reviewed by me and considered in my medical decision making (see chart for details).  Patient presented to the emergency department today with concerns for continued cough, shortness of breath.  Patient was recently diagnosed with influenza.  Did have somewhat diffuse wheezing on initial auscultation.  Do have concerns for a COPD exacerbation in the setting of influenza.  After breathing treatments patient continued to have difficulty and respiratory distress.  Will plan on admission.  Discussed findings and plan with patient. _________________________________________   FINAL CLINICAL IMPRESSION(S) / ED DIAGNOSES  Final diagnoses:  Cough  Chronic obstructive pulmonary disease, unspecified COPD type (HCC)  Influenza A  Hypoxia     Note: This dictation was prepared with Dragon dictation. Any transcriptional errors that result from this process are unintentional     Phineas SemenGoodman, Takari Duncombe, MD 10/28/17 2126

## 2017-10-28 NOTE — Progress Notes (Signed)
Pharmacy Antibiotic Note  Billy Wise is a 72 y.o. male admitted on 10/28/2017 with pneumonia.  Pharmacy has been consulted for ceftriaxone dosing.  Plan: Ceftriaxone 1 gram q 24 hours ordered.  Height: 5\' 5"  (165.1 cm) Weight: 175 lb (79.4 kg) IBW/kg (Calculated) : 61.5  Temp (24hrs), Avg:99 F (37.2 C), Min:97.4 F (36.3 C), Max:101.7 F (38.7 C)  Recent Labs  Lab 10/28/17 1610 10/28/17 1700  WBC 12.2*  --   CREATININE 0.96  --   LATICACIDVEN  --  1.8    Estimated Creatinine Clearance: 70.6 mL/min (by C-G formula based on SCr of 0.96 mg/dL).    No Known Allergies  Antimicrobials this admission: Azithromycin, ceftriaxone 1/10  >>   >>   Dose adjustments this admission:   Microbiology results: 1/10 BCx: pending    Thank you for allowing pharmacy to be a part of this patient's care.  Naoma Boxell S 10/28/2017 11:32 PM

## 2017-10-28 NOTE — H&P (Signed)
[facility] [place] at [facility]   PATIENT NAME: Billy Wise    MR#:  [id number]  DATE OF BIRTH:  July 24, 1946  DATE OF ADMISSION:  10/28/2017  PRIMARY CARE PHYSICIAN: Rosalio Macadamia, MD   REQUESTING/REFERRING PHYSICIAN: Derrill Kay, MD  CHIEF COMPLAINT:   Chief Complaint  Patient presents with  . Cough    HISTORY OF PRESENT ILLNESS:  Billy Wise  is a 72 y.o. male who presents with cough, fever, shortness of breath.  Patient states that he was diagnosed with influenza about a week ago.  He had already had symptoms for greater than 48 hours and so was not given Tamiflu.  He was given some p.o. steroids given that he has COPD.  He finished his course of steroids and felt better for a day or 2, and then began feeling worse again.  Over the last 3 days his shortness of breath has increased significantly, and his cough is become much more productive and intense.  Here in the ED today he is febrile, meet sepsis criteria, has an x-ray which looks consistent with an interstitial or bronchiolar pneumonia.  Hospitalist were called for admission  PAST MEDICAL HISTORY:   Past Medical History:  Diagnosis Date  . Actinic keratoses   . BPH (benign prostatic hyperplasia)   . CKD stage 1 due to type 2 diabetes mellitus (HCC)   . COPD (chronic obstructive pulmonary disease) (HCC)   . Diabetes mellitus without complication (HCC)   . GERD (gastroesophageal reflux disease)   . Hypertension   . Insomnia   . Iron deficiency anemia   . Mixed hyperlipidemia   . OSA (obstructive sleep apnea)     PAST SURGICAL HISTORY:   Past Surgical History:  Procedure Laterality Date  . CARDIAC CATHETERIZATION      SOCIAL HISTORY:   Social History   Tobacco Use  . Smoking status: Never Smoker  . Smokeless tobacco: Never Used  Substance Use Topics  . Alcohol use: No    Frequency: Never    FAMILY HISTORY:   Family History  Problem Relation Age of Onset  . Heart attack  Sister   . Hypertension Sister     DRUG ALLERGIES:  No Known Allergies  MEDICATIONS AT HOME:   Prior to Admission medications   Medication Sig Start Date End Date Taking? Authorizing Provider  albuterol (PROVENTIL) (2.5 MG/3ML) 0.083% nebulizer solution Take 3 mLs (2.5 mg total) by nebulization every 4 (four) hours as needed for wheezing or shortness of breath. 03/01/16   Myrna Blazer, MD  amLODipine (NORVASC) 10 MG tablet Take 10 mg by mouth daily. 09/28/17   [provider]  BYETTA 10 MCG PEN 10 MCG/0.04ML SOPN injection Inject 10 mcg into the skin once a week. 09/29/17   [provider]  finasteride (PROSCAR) 5 MG tablet Take 5 mg by mouth daily. 08/06/17   [provider]  glimepiride (AMARYL) 4 MG tablet Take 4 mg by mouth 2 (two) times daily. 09/28/17   [provider]  LEVEMIR FLEXTOUCH 100 UNIT/ML Pen Inject 80 Units into the skin daily. 09/29/17   [provider]  metFORMIN (GLUCOPHAGE) 1000 MG tablet Take 1 tablet by mouth 2 (two) times daily. 09/24/17   [provider]  omeprazole (PRILOSEC) 20 MG capsule Take 20 mg by mouth daily. 09/24/17   [provider]  oxybutynin (DITROPAN-XL) 10 MG 24 hr tablet Take 10 mg by mouth daily. 09/28/17   [provider]  Rennis Golden  HFA 108 (90 Base) MCG/ACT inhaler Take 2 puffs by mouth every 4 (four) hours as needed. 10/15/17   [provider]  quinapril (ACCUPRIL) 40 MG tablet Take 40 mg by mouth daily. 09/28/17   [provider]  simvastatin (ZOCOR) 20 MG tablet Take 20 mg by mouth every evening. 09/28/17   [provider]  SPIRIVA HANDIHALER 18 MCG inhalation capsule Take 1 Dose by mouth daily. 10/07/17   [provider]  SYMBICORT 80-4.5 MCG/ACT inhaler Take 2 puffs by mouth 2 (two) times daily. 09/29/17   [provider]  tamsulosin (FLOMAX) 0.4 MG CAPS capsule Take 1 capsule by mouth daily. 08/06/17   [provider]  traZODone (DESYREL) 50 MG tablet Take 50 mg by mouth at bedtime. 10/06/17   [provider]    REVIEW OF SYSTEMS:  Review of Systems  Constitutional: Positive for fever and malaise/fatigue. Negative for chills and weight loss.  HENT: Negative for ear pain, hearing loss and tinnitus.   Eyes: Negative for blurred vision, double vision, pain and redness.  Respiratory: Positive for cough, shortness of breath and wheezing. Negative for hemoptysis.   Cardiovascular: Negative for chest pain, palpitations, orthopnea and leg swelling.  Gastrointestinal: Negative for abdominal pain, constipation, diarrhea, nausea and vomiting.  Genitourinary: Negative for dysuria, frequency and hematuria.  Musculoskeletal: Negative for back pain, joint pain and neck pain.  Skin:       No acne, rash, or lesions  Neurological: Positive for weakness. Negative for dizziness, tremors and focal weakness.  Endo/Heme/Allergies: Negative for polydipsia. Does not bruise/bleed easily.  Psychiatric/Behavioral: Negative for depression. The patient is not nervous/anxious and does not have insomnia.      VITAL SIGNS:   Vitals:   10/28/17 1608 10/28/17 1715 10/28/17 1800  BP: (!) 155/75    Pulse: (!) 109 (!) 110 (!) 107  Resp: (!) 24 (!) 23 (!) 22  Temp: 98.9 F (37.2 C) (!) 101.7 F (38.7 C)   TempSrc: Oral Oral   SpO2: 90% 97% 96%  Weight: 79.4 kg (175 lb)    Height: 5\' 5"  (1.651 m)     Wt Readings from Last 3 Encounters:  10/28/17 79.4 kg (175 lb)  03/01/16 81.6 kg (180 lb)    PHYSICAL EXAMINATION:  Physical Exam  Vitals reviewed. Constitutional: He is oriented to person, place, and time. He appears well-developed and well-nourished. No distress.  HENT:  Head: Normocephalic and atraumatic.  Mouth/Throat: Oropharynx is clear and moist.  Eyes: Conjunctivae and EOM are normal. Pupils are equal, round, and reactive to light. No scleral icterus.  Neck: Normal range of motion. Neck  supple. No JVD present. No thyromegaly present.  Cardiovascular: Normal rate, regular rhythm and intact distal pulses. Exam reveals no gallop and no friction rub.  No murmur heard. Respiratory: Effort normal. No respiratory distress. He has wheezes. He has no rales.  Rhonchi  GI: Soft. Bowel sounds are normal. He exhibits no distension. There is no tenderness.  Musculoskeletal: Normal range of motion. He exhibits no edema.  No arthritis, no gout  Lymphadenopathy:    He has no cervical adenopathy.  Neurological: He is alert and oriented to person, place, and time. No cranial nerve deficit.  No dysarthria, no aphasia  Skin: Skin is warm and dry. No rash noted. No erythema.  Psychiatric: He has a normal mood and affect. His behavior is normal. Judgment and thought content normal.    LABORATORY PANEL:   CBC Recent Labs  Lab 10/28/17  1610  WBC 12.2*  HGB 11.8*  HCT 36.7*  PLT 338   ------------------------------------------------------------------------------------------------------------------  Chemistries  Recent Labs  Lab 10/28/17 1610  NA 128*  K 5.1  CL 96*  CO2 22  GLUCOSE 301*  BUN 15  CREATININE 0.96  CALCIUM 8.1*  AST 23  ALT 14*  ALKPHOS 68  BILITOT 0.6   ------------------------------------------------------------------------------------------------------------------  Cardiac Enzymes Recent Labs  Lab 10/28/17 1610  TROPONINI 0.03*   ------------------------------------------------------------------------------------------------------------------  RADIOLOGY:  Dg Chest 2 View  Result Date: 10/28/2017 CLINICAL DATA:  Cough, shortness of breath and fever. EXAM: CHEST  2 VIEW COMPARISON:  03/01/2016 FINDINGS: The heart size and mediastinal contours are within normal limits. Bronchial thickening present, especially in the lower lung zones bilaterally. This may be consistent with acute bronchitis. No edema, focal airspace consolidation, pneumothorax, nodule  or pleural fluid identified. The visualized skeletal structures are unremarkable. IMPRESSION: Bilateral lower lung zone bronchial thickening which may be consistent with acute bronchitis. Electronically Signed   By: Irish Lack M.D.   On: 10/28/2017 16:48    EKG:   Orders placed or performed during the hospital encounter of 10/28/17  . EKG 12-Lead  . EKG 12-Lead  . ED EKG  . ED EKG    IMPRESSION AND PLAN:  Principal Problem:   Sepsis (HCC) -IV antibiotics, lactic acid within normal limits, blood pressure stable, secondary bacterial pneumonia is the cause of the sepsis, cultures sent Active Problems:   Secondary bacterial pneumonia -IV antibiotics, nebs and other COPD treatment and supportive treatment as below   Influenza A -much too far out to start Tamiflu course with any expected efficacy, supportive treatment   COPD with acute exacerbation (HCC) -IV steroids, antibiotics as above, scheduled and PRN duo nebs, PRN antitussive   Diabetes (HCC) -sliding scale insulin with corresponding glucose checks, continue home dose long-acting insulin   HTN (hypertension) -continue home meds  All the records are reviewed and case discussed with ED provider. Management plans discussed with the patient and/or family.  DVT PROPHYLAXIS: SubQ lovenox  GI PROPHYLAXIS: PPI at home dose  ADMISSION STATUS: Inpatient  CODE STATUS: Full Code Status History    This patient does not have a recorded code status. Please follow your organizational policy for patients in this situation.      TOTAL TIME TAKING CARE OF THIS PATIENT: 45 minutes.   Destyne Goodreau FIELDING 10/28/2017, 8:59 PM  [employer]  [phone]  CC: Primary care physician; Rosalio Macadamia, MD  Note:  This document was prepared using Dragon voice recognition software and may include unintentional dictation errors.

## 2017-10-28 NOTE — ED Triage Notes (Signed)
Pt states was dx with the flu on on 10/20/2017. Pt states then has had increased coughing and shortness of breath and fever. Pt reports fever of 101 at home, no fever on arrival. Pt noted to be dyspneic on exertion. RA sats 90% on arrival to hospital.

## 2017-10-28 NOTE — ED Notes (Signed)
Pt given sandwich tray at this time  

## 2017-10-29 LAB — CBC
HCT: 33.3 % — ABNORMAL LOW (ref 40.0–52.0)
Hemoglobin: 11 g/dL — ABNORMAL LOW (ref 13.0–18.0)
MCH: 25.4 pg — AB (ref 26.0–34.0)
MCHC: 33 g/dL (ref 32.0–36.0)
MCV: 77 fL — ABNORMAL LOW (ref 80.0–100.0)
PLATELETS: 291 10*3/uL (ref 150–440)
RBC: 4.33 MIL/uL — AB (ref 4.40–5.90)
RDW: 15.7 % — ABNORMAL HIGH (ref 11.5–14.5)
WBC: 10.3 10*3/uL (ref 3.8–10.6)

## 2017-10-29 LAB — BASIC METABOLIC PANEL
Anion gap: 10 (ref 5–15)
BUN: 19 mg/dL (ref 6–20)
CALCIUM: 7.5 mg/dL — AB (ref 8.9–10.3)
CO2: 20 mmol/L — AB (ref 22–32)
CREATININE: 1.07 mg/dL (ref 0.61–1.24)
Chloride: 97 mmol/L — ABNORMAL LOW (ref 101–111)
GFR calc Af Amer: >60 mL/min (ref >60)
Glucose, Bld: 464 mg/dL — ABNORMAL HIGH (ref 65–99)
Potassium: 4.8 mmol/L (ref 3.5–5.1)
Sodium: 127 mmol/L — ABNORMAL LOW (ref 135–145)

## 2017-10-29 LAB — PROCALCITONIN: Procalcitonin: <0.1 ng/mL

## 2017-10-29 LAB — GLUCOSE, CAPILLARY
GLUCOSE-CAPILLARY: 468 mg/dL — AB (ref 65–99)
GLUCOSE-CAPILLARY: 300 mg/dL — AB (ref 65–99)
GLUCOSE-CAPILLARY: 269 mg/dL — AB (ref 65–99)
GLUCOSE-CAPILLARY: 297 mg/dL — AB (ref 65–99)
GLUCOSE-CAPILLARY: 312 mg/dL — AB (ref 65–99)
Glucose-Capillary: 468 mg/dL — ABNORMAL HIGH (ref 65–99)
Glucose-Capillary: 487 mg/dL — ABNORMAL HIGH (ref 65–99)
Glucose-Capillary: 334 mg/dL — ABNORMAL HIGH (ref 65–99)
Glucose-Capillary: 338 mg/dL — ABNORMAL HIGH (ref 65–99)

## 2017-10-29 LAB — HEMOGLOBIN A1C
Hgb A1c MFr Bld: 7.2 % — ABNORMAL HIGH (ref 4.8–5.6)
Mean Plasma Glucose: 159.94 mg/dL

## 2017-10-29 MED ORDER — PREDNISONE 50 MG PO TABS
50.0000 mg | ORAL_TABLET | Freq: Every day | ORAL | Status: DC
  Administered 2017-10-30: 50 mg via ORAL
  Filled 2017-10-29: qty 1

## 2017-10-29 MED ORDER — BENZONATATE 100 MG PO CAPS
200.0000 mg | ORAL_CAPSULE | Freq: Three times a day (TID) | ORAL | Status: DC | PRN
  Administered 2017-10-29 – 2017-10-31 (×5): 200 mg via ORAL
  Filled 2017-10-29 (×5): qty 2

## 2017-10-29 MED ORDER — HYDROCOD POLST-CPM POLST ER 10-8 MG/5ML PO SUER
5.0000 mL | Freq: Two times a day (BID) | ORAL | Status: DC | PRN
  Administered 2017-10-29: 5 mL via ORAL
  Filled 2017-10-29: qty 5

## 2017-10-29 MED ORDER — INSULIN ASPART 100 UNIT/ML ~~LOC~~ SOLN
12.0000 [IU] | Freq: Once | SUBCUTANEOUS | Status: AC
  Administered 2017-10-29: 12 [IU] via SUBCUTANEOUS
  Filled 2017-10-29: qty 1

## 2017-10-29 MED ORDER — INSULIN REGULAR HUMAN 100 UNIT/ML IJ SOLN
15.0000 [IU] | Freq: Once | INTRAMUSCULAR | Status: AC
  Administered 2017-10-29: 15 [IU] via INTRAVENOUS
  Filled 2017-10-29: qty 0.15

## 2017-10-29 MED ORDER — SODIUM CHLORIDE 0.9 % IV SOLN
INTRAVENOUS | Status: AC
  Administered 2017-10-29 (×2): via INTRAVENOUS

## 2017-10-29 MED ORDER — KETOROLAC TROMETHAMINE 15 MG/ML IJ SOLN
15.0000 mg | Freq: Once | INTRAMUSCULAR | Status: AC
  Administered 2017-10-29: 15 mg via INTRAVENOUS
  Filled 2017-10-29: qty 1

## 2017-10-29 MED ORDER — METHYLPREDNISOLONE SODIUM SUCC 125 MG IJ SOLR: 60.0000 mg | Freq: Two times a day (BID) | INTRAMUSCULAR | Status: DC

## 2017-10-29 MED ORDER — TRAMADOL HCL 50 MG PO TABS
50.0000 mg | ORAL_TABLET | Freq: Four times a day (QID) | ORAL | Status: DC | PRN
  Administered 2017-10-29: 50 mg via ORAL
  Filled 2017-10-29: qty 1

## 2017-10-29 MED ORDER — INSULIN ASPART 100 UNIT/ML ~~LOC~~ SOLN
5.0000 [IU] | Freq: Three times a day (TID) | SUBCUTANEOUS | Status: DC
  Administered 2017-10-29 – 2017-10-30 (×4): 5 [IU] via SUBCUTANEOUS
  Filled 2017-10-29 (×4): qty 1

## 2017-10-29 NOTE — Progress Notes (Addendum)
[facility] - [place] at [facility]   PATIENT NAME: Billy Wise    MR#:  [id number]  DATE OF BIRTH:  08/31/1946  SUBJECTIVE:   Patient feeling much better this morning. Concern about blood sugars being high on steroids  REVIEW OF SYSTEMS:    Review of Systems  Constitutional: Negative for fever, chills weight loss HENT: Negative for ear pain, nosebleeds, congestion, facial swelling, rhinorrhea, neck pain, neck stiffness and ear discharge.   Respiratory: ++for cough, shortness of breath, NO wheezing  Cardiovascular: Negative for chest pain, palpitations and leg swelling.  Gastrointestinal: Negative for heartburn, abdominal pain, vomiting, diarrhea or consitpation Genitourinary: Negative for dysuria, urgency, frequency, hematuria Musculoskeletal: Negative for back pain or joint pain Neurological: Negative for dizziness, seizures, syncope, focal weakness,  numbness and headaches.  Hematological: Does not bruise/bleed easily.  Psychiatric/Behavioral: Negative for hallucinations, confusion, dysphoric mood    Tolerating Diet: yes      DRUG ALLERGIES:  No Known Allergies  VITALS:  Blood pressure 125/67, pulse 81, temperature (!) 97.4 F (36.3 C), temperature source Oral, resp. rate 18, height 5\' 5"  (1.651 m), weight 79.4 kg (175 lb), SpO2 93 %.  PHYSICAL EXAMINATION:  Constitutional: Appears well-developed and well-nourished. No distress. HENT: Normocephalic. Marland Kitchen Oropharynx is clear and moist.  Eyes: Conjunctivae and EOM are normal. PERRLA, no scleral icterus.  Neck: Normal ROM. Neck supple. No JVD. No tracheal deviation. CVS: RRR, S1/S2 +, no murmurs, no gallops, no carotid bruit.  Pulmonary: RLL crackles no wheezing  Abdominal: Soft. BS +,  no distension, tenderness, rebound or guarding.  Musculoskeletal: Normal range of motion. No edema and no tenderness.  Neuro: Alert. CN 2-12 grossly intact. No focal deficits. Skin: Skin is warm and dry. No rash  noted. Psychiatric: Normal mood and affect.      LABORATORY PANEL:   CBC Recent Labs  Lab 10/29/17 0330  WBC 10.3  HGB 11.0*  HCT 33.3*  PLT 291   ------------------------------------------------------------------------------------------------------------------  Chemistries  Recent Labs  Lab 10/28/17 1610 10/29/17 0330  NA 128* 127*  K 5.1 4.8  CL 96* 97*  CO2 22 20*  GLUCOSE 301* 464*  BUN 15 19  CREATININE 0.96 1.07  CALCIUM 8.1* 7.5*  AST 23  --   ALT 14*  --   ALKPHOS 68  --   BILITOT 0.6  --    ------------------------------------------------------------------------------------------------------------------  Cardiac Enzymes Recent Labs  Lab 10/28/17 1610  TROPONINI 0.03*   ------------------------------------------------------------------------------------------------------------------  RADIOLOGY:  Dg Chest 2 View  Result Date: 10/28/2017 CLINICAL DATA:  Cough, shortness of breath and fever. EXAM: CHEST  2 VIEW COMPARISON:  03/01/2016 FINDINGS: The heart size and mediastinal contours are within normal limits. Bronchial thickening present, especially in the lower lung zones bilaterally. This may be consistent with acute bronchitis. No edema, focal airspace consolidation, pneumothorax, nodule or pleural fluid identified. The visualized skeletal structures are unremarkable. IMPRESSION: Bilateral lower lung zone bronchial thickening which may be consistent with acute bronchitis. Electronically Signed   By: Irish Lack M.D.   On: 10/28/2017 16:48     ASSESSMENT AND PLAN:   72 year old male with diagnosis of influenza about a week ago who presents with cough and shortness of breath.  1.Sepsis with fever, tachycardia and tachypnea with Acute hypoxic respiratory failure due to pneumonia. With normal pro calcitonin level I suspect this is viral pneumonia Will discontinue antibiotics Wean oxygen 2. Influenza A: Patient is out of timeline for Tamiflu. His  symptoms started about 2 weeks ago  3. Hyponatremia from poor by mouth intake and pneumonia as well as elevated blood sugar Continue IV fluids and repeat in a.m.   4. Mild COPD acute exacerbation: Patient has no wheezing on exam. I will discontinue IV steroids. Patient will need prednisone 50 mg for 3 days.  5. Diabetes: Continue management as per recommendations by diabetes consultant.  6. Essential hypertension: Continue lisinopril and Norvasc.  7. Hyperlipidemia: Continue statin  8. BPH: Continue tamsulosin Management plans discussed with the patient and he is in agreement.  CODE STATUS: full  TOTAL TIME TAKING CARE OF THIS PATIENT: 30 minutes.     POSSIBLE D/C tomorrow, DEPENDING ON CLINICAL CONDITION.   Cana Mignano M.D on 10/29/2017 at 10:22 AM  Between 7am to 6pm - Pager - [phone] After 6pm go to [email or web address] - password [employer] [email or web address]  [facility] [place] [facility]  Office  [phone]  CC: Primary care physician; Rosalio MacadamiaHelman, Joaquim, MD  Note: This dictation was prepared with Dragon dictation along with smaller phrase technology. Any transcriptional errors that result from this process are unintentional.

## 2017-10-29 NOTE — Progress Notes (Signed)
Pt FSBS 523. Primary nurse paged and spoke to Dr. Anne HahnWillis. Orders received for Insulin. MD to place order. Primary nurse to continue to monitor.

## 2017-10-29 NOTE — Progress Notes (Signed)
Pt FSBS 487 on recheck. Primary nurse paged and spoke to Dr. Sheryle Hailiamond. Dr. Sheryle Hailiamond to place orders for additional insulin. Primary nurse to continue to monitor.

## 2017-10-29 NOTE — Care Management Important Message (Signed)
Important Message  Patient Details  Name: Billy Wise MRN: [id number] Date of Birth: Feb 20, 1946   Medicare Important Message Given:  Yes    Chapman FitchBOWEN, Dorthula Bier T, RN 10/29/2017, 1:59 PM

## 2017-10-29 NOTE — Plan of Care (Signed)
Pt with improving SOB. Persistent cough that causes SOB and pain. Maintained on 2L via [place]. Multiple PRn medications given for cough.

## 2017-10-29 NOTE — Progress Notes (Signed)
Pt complaining of chest and abdominal pain from coughing. Primary nurse paged and spoke to DR. Willis. Orders received for pain and cough medication. MD to place orders. Primary nurse to continue to monitor.

## 2017-10-29 NOTE — Progress Notes (Signed)
Pt FSBS 468. Prime nurse paged and spoke to HooppoleWillis. Orders received for additional 15 units IV insulin. Primary nurse to continue to monitor.

## 2017-10-29 NOTE — Progress Notes (Signed)
Inpatient Diabetes Program Recommendations  AACE/ADA: New Consensus Statement on Inpatient Glycemic Control (2015)  Target Ranges:  Prepandial:   less than 140 mg/dL      Peak postprandial:   less than 180 mg/dL (1-2 hours)      Critically ill patients:  140 - 180 mg/dL   Lab Results  Component Value Date   GLUCAP 338 (H) 10/29/2017     Spoke to patient today regarding home diabetes medications.  He confirms he has been taking insulin as ordered. I have instructed the patient to give his Levemir 77 units in a divided dose of 35 and 42units for better absorption.  He should rotate insulin around his entire abdomen to improve absorption- patient admits he has some solid lumps under the skin where he gives insulin often.  Encouraged him to visualize his abdomen like a dart board and to rotate insulin completely around it (avoiding the belly button) to improve absorption.   Susette RacerJulie Loren Sawaya, RN, BA, MHA, CDE Diabetes Coordinator Inpatient Diabetes Program  [phone] (Team Pager) [phone] [facility] Office) 10/29/2017 1:15 PM

## 2017-10-29 NOTE — Progress Notes (Signed)
Inpatient Diabetes Program Recommendations  AACE/ADA: New Consensus Statement on Inpatient Glycemic Control (2015)  Target Ranges:  Prepandial:   less than 140 mg/dL      Peak postprandial:   less than 180 mg/dL (1-2 hours)      Critically ill patients:  140 - 180 mg/dL   Lab Results  Component Value Date   GLUCAP 269 (H) 10/29/2017    Review of Glycemic Control  Results for Billy Wise, [name] (MRN [id number]) as of 10/29/2017 08:50  Ref. Range 10/29/2017 01:15 10/29/2017 03:09 10/29/2017 03:10 10/29/2017 06:00 10/29/2017 07:34  Glucose-Capillary Latest Ref Range: 65 - 99 mg/dL 782468 (H) 956468 (H) 213487 (H) 300 (H) 269 (H)    Diabetes history: Type 2- a1C pending Outpatient Diabetes medications: Byetta 10mcg bid, Amaryl 4mg  qam, Levemir 77 units qhs, Metformin 1000mg  bid  Current orders for Inpatient glycemic control: Levemir 77 units qhs, Novolog 0-9 units tid and hs  Inpatient Diabetes Program Recommendations: Consider adding Novolog 5 units tid with meals (hold if he eats less than 50%) .  Consider increasing Novolog correction to 0-20 units tid while on steroids.   Susette RacerJulie Azrael Maddix, RN, BA, MHA, CDE Diabetes Coordinator Inpatient Diabetes Program  [phone] (Team Pager) [phone] [facility] Office) 10/29/2017 8:56 AM

## 2017-10-30 LAB — PROCALCITONIN: Procalcitonin: <0.1 ng/mL

## 2017-10-30 LAB — GLUCOSE, CAPILLARY
GLUCOSE-CAPILLARY: 221 mg/dL — AB (ref 65–99)
GLUCOSE-CAPILLARY: 400 mg/dL — AB (ref 65–99)
GLUCOSE-CAPILLARY: 358 mg/dL — AB (ref 65–99)
Glucose-Capillary: 237 mg/dL — ABNORMAL HIGH (ref 65–99)

## 2017-10-30 LAB — BASIC METABOLIC PANEL
Anion gap: 7 (ref 5–15)
BUN: 22 mg/dL — AB (ref 6–20)
CALCIUM: 7.9 mg/dL — AB (ref 8.9–10.3)
CO2: 23 mmol/L (ref 22–32)
CREATININE: 0.88 mg/dL (ref 0.61–1.24)
Chloride: 103 mmol/L (ref 101–111)
GFR calc Af Amer: >60 mL/min (ref >60)
GFR calc non Af Amer: >60 mL/min (ref >60)
GLUCOSE: 285 mg/dL — AB (ref 65–99)
POTASSIUM: 4.7 mmol/L (ref 3.5–5.1)
Sodium: 133 mmol/L — ABNORMAL LOW (ref 135–145)

## 2017-10-30 MED ORDER — INSULIN DETEMIR 100 UNIT/ML ~~LOC~~ SOLN
82.0000 [IU] | Freq: Every day | SUBCUTANEOUS | Status: DC
  Administered 2017-10-30 – 2017-10-31 (×2): 82 [IU] via SUBCUTANEOUS
  Filled 2017-10-30 (×3): qty 0.82

## 2017-10-30 MED ORDER — GUAIFENESIN ER 600 MG PO TB12
600.0000 mg | ORAL_TABLET | Freq: Two times a day (BID) | ORAL | Status: DC
  Administered 2017-10-30 – 2017-11-01 (×5): 600 mg via ORAL
  Filled 2017-10-30 (×5): qty 1

## 2017-10-30 MED ORDER — BUDESONIDE 0.25 MG/2ML IN SUSP
0.2500 mg | Freq: Two times a day (BID) | RESPIRATORY_TRACT | Status: DC
  Administered 2017-10-30 – 2017-11-01 (×4): 0.25 mg via RESPIRATORY_TRACT
  Filled 2017-10-30 (×4): qty 2

## 2017-10-30 MED ORDER — INSULIN ASPART 100 UNIT/ML ~~LOC~~ SOLN
8.0000 [IU] | Freq: Three times a day (TID) | SUBCUTANEOUS | Status: DC
  Administered 2017-10-30 – 2017-11-01 (×6): 8 [IU] via SUBCUTANEOUS
  Filled 2017-10-30 (×6): qty 1

## 2017-10-30 MED ORDER — LEVOFLOXACIN 500 MG PO TABS
500.0000 mg | ORAL_TABLET | Freq: Every day | ORAL | Status: DC
  Administered 2017-10-30 – 2017-11-01 (×3): 500 mg via ORAL
  Filled 2017-10-30 (×3): qty 1

## 2017-10-30 MED ORDER — GUAIFENESIN-CODEINE 100-10 MG/5ML PO SOLN
15.0000 mL | ORAL | Status: DC | PRN
  Administered 2017-10-31: 15 mL via ORAL
  Filled 2017-10-30: qty 15

## 2017-10-30 MED ORDER — METHYLPREDNISOLONE SODIUM SUCC 125 MG IJ SOLR
60.0000 mg | Freq: Three times a day (TID) | INTRAMUSCULAR | Status: DC
  Administered 2017-10-30 – 2017-11-01 (×7): 60 mg via INTRAVENOUS
  Filled 2017-10-30 (×7): qty 2

## 2017-10-30 NOTE — Progress Notes (Signed)
[facility] - [place] at [facility]   PATIENT NAME: Billy GrillSteven Wise    MR#:  [id number]  DATE OF BIRTH:  Jun 16, 1946  SUBJECTIVE:   Patient still complains of shortness of breath and cough with any activity   REVIEW OF SYSTEMS:    Review of Systems  Constitutional: Negative for fever, chills weight loss HENT: Negative for ear pain, nosebleeds, congestion, facial swelling, rhinorrhea, neck pain, neck stiffness and ear discharge.   Respiratory: ++for cough, shortness of breath, NO wheezing  Cardiovascular: Negative for chest pain, palpitations and leg swelling.  Gastrointestinal: Negative for heartburn, abdominal pain, vomiting, diarrhea or consitpation Genitourinary: Negative for dysuria, urgency, frequency, hematuria Musculoskeletal: Negative for back pain or joint pain Neurological: Negative for dizziness, seizures, syncope, focal weakness,  numbness and headaches.  Hematological: Does not bruise/bleed easily.  Psychiatric/Behavioral: Negative for hallucinations, confusion, dysphoric mood    Tolerating Diet: yes      DRUG ALLERGIES:  No Known Allergies  VITALS:  Blood pressure 140/88, pulse (!) 123, temperature 97.8 F (36.6 C), temperature source Oral, resp. rate 20, height 5\' 5"  (1.651 m), weight 175 lb (79.4 kg), SpO2 96 %.  PHYSICAL EXAMINATION:  Constitutional: Appears well-developed and well-nourished. No distress. HENT: Normocephalic. Marland Kitchen. Oropharynx is clear and moist.  Eyes: Conjunctivae and EOM are normal. PERRLA, no scleral icterus.  Neck: Normal ROM. Neck supple. No JVD. No tracheal deviation. CVS: RRR, S1/S2 +, no murmurs, no gallops, no carotid bruit.  Pulmonary: RLL crackles positive wheezing Abdominal: Soft. BS +,  no distension, tenderness, rebound or guarding.  Musculoskeletal: Normal range of motion. No edema and no tenderness.  Neuro: Alert. CN 2-12 grossly intact. No focal deficits. Skin: Skin is warm and dry. No rash  noted. Psychiatric: Normal mood and affect.      LABORATORY PANEL:   CBC Recent Labs  Lab 10/29/17 0330  WBC 10.3  HGB 11.0*  HCT 33.3*  PLT 291   ------------------------------------------------------------------------------------------------------------------  Chemistries  Recent Labs  Lab 10/28/17 1610  10/30/17 0648  NA 128*   < > 133*  K 5.1   < > 4.7  CL 96*   < > 103  CO2 22   < > 23  GLUCOSE 301*   < > 285*  BUN 15   < > 22*  CREATININE 0.96   < > 0.88  CALCIUM 8.1*   < > 7.9*  AST 23  --   --   ALT 14*  --   --   ALKPHOS 68  --   --   BILITOT 0.6  --   --    < > = values in this interval not displayed.   ------------------------------------------------------------------------------------------------------------------  Cardiac Enzymes Recent Labs  Lab 10/28/17 1610  TROPONINI 0.03*   ------------------------------------------------------------------------------------------------------------------  RADIOLOGY:  Dg Chest 2 View  Result Date: 10/28/2017 CLINICAL DATA:  Cough, shortness of breath and fever. EXAM: CHEST  2 VIEW COMPARISON:  03/01/2016 FINDINGS: The heart size and mediastinal contours are within normal limits. Bronchial thickening present, especially in the lower lung zones bilaterally. This may be consistent with acute bronchitis. No edema, focal airspace consolidation, pneumothorax, nodule or pleural fluid identified. The visualized skeletal structures are unremarkable. IMPRESSION: Bilateral lower lung zone bronchial thickening which may be consistent with acute bronchitis. Electronically Signed   By: Irish LackGlenn  Yamagata M.D.   On: 10/28/2017 16:48     ASSESSMENT AND PLAN:   72 year old male with diagnosis of influenza about a week ago who presents with  cough and shortness of breath.  1.Sepsis with fever, tachycardia and tachypnea with Acute hypoxic respiratory failure due to pneumonia. With normal pro calcitonin level, concern for possible  viral pneumonia Patient still very symptomatic I will place him on oral Levaquin Wean oxygen  2. Acute on chronic COPD acute exacerbation:  We will treat patient with IV Solu-Medrol discontinue prednisone Place him on Pulmicort neb Add Mucinex   3. Influenza A: Patient is out of timeline for Tamiflu. His symptoms started about 2 weeks ago  4. Hyponatremia from poor by mouth intake and pneumonia as well as elevated blood sugar Improved with IV hydration  5. Diabetes: Continue management as per recommendations by diabetes consultant.  6. Essential hypertension: Continue lisinopril and Norvasc.  7. Hyperlipidemia: Continue statin  8. BPH: Continue tamsulosin Management plans discussed with the patient and he is in agreement.  CODE STATUS: full  TOTAL TIME TAKING CARE OF THIS PATIENT: 30 minutes.     POSSIBLE D/C tomorrow, DEPENDING ON CLINICAL CONDITION.   [name] M.D on 10/30/2017 at 3:29 PM  Between 7am to 6pm - Pager - [phone] After 6pm go to [email or web address] - password [employer]  [facility] [place] [facility]  Office  [phone]  CC: Primary care physician; Rosalio Macadamia, MD  Note: This dictation was prepared with Dragon dictation along with smaller phrase technology. Any transcriptional errors that result from this process are unintentional.

## 2017-10-31 LAB — GLUCOSE, CAPILLARY
GLUCOSE-CAPILLARY: 255 mg/dL — AB (ref 65–99)
GLUCOSE-CAPILLARY: 340 mg/dL — AB (ref 65–99)
GLUCOSE-CAPILLARY: 403 mg/dL — AB (ref 65–99)
Glucose-Capillary: 325 mg/dL — ABNORMAL HIGH (ref 65–99)
Glucose-Capillary: 433 mg/dL — ABNORMAL HIGH (ref 65–99)
Glucose-Capillary: 390 mg/dL — ABNORMAL HIGH (ref 65–99)

## 2017-10-31 LAB — HEMOGLOBIN A1C
Hgb A1c MFr Bld: 7.2 % — ABNORMAL HIGH (ref 4.8–5.6)
Mean Plasma Glucose: 160 mg/dL

## 2017-10-31 LAB — PROCALCITONIN

## 2017-10-31 MED ORDER — INSULIN ASPART 100 UNIT/ML ~~LOC~~ SOLN
0.0000 [IU] | Freq: Three times a day (TID) | SUBCUTANEOUS | Status: DC
  Administered 2017-11-01: 8 [IU] via SUBCUTANEOUS
  Administered 2017-11-01: 5 [IU] via SUBCUTANEOUS
  Filled 2017-10-31 (×2): qty 1

## 2017-10-31 MED ORDER — INSULIN REGULAR HUMAN 100 UNIT/ML IJ SOLN
15.0000 [IU] | Freq: Once | INTRAMUSCULAR | Status: AC
  Administered 2017-10-31: 15 [IU] via INTRAVENOUS
  Filled 2017-10-31: qty 0.15

## 2017-10-31 NOTE — Progress Notes (Signed)
Inpatient Diabetes Program Recommendations  AACE/ADA: New Consensus Statement on Inpatient Glycemic Control (2015)  Target Ranges:  Prepandial:   less than 140 mg/dL      Peak postprandial:   less than 180 mg/dL (1-2 hours)      Critically ill patients:  140 - 180 mg/dL   Lab Results  Component Value Date   GLUCAP 390 (H) 10/31/2017   HGBA1C 7.2 (H) 10/29/2017    Review of Glycemic Control  CBGs still elevated, likely d/t steroids.  Inpatient Diabetes Program Recommendations:    Increase Novolog to 0-20 units tidwc and hs while on steroids.  Will continue to follow.  Thank you. Ailene Ardshonda Rhetta Cleek, RD, LDN, CDE Inpatient Diabetes Coordinator [phone]

## 2017-10-31 NOTE — Progress Notes (Signed)
Med twice this shift four non-prod. Cough.  SOB on exertion.  Blood sugars high today (solumeddrol)-see labs. Says he feels a lot better today than yesterday.

## 2017-10-31 NOTE — Progress Notes (Signed)
[facility] - [place] at [facility]   PATIENT NAME: Billy Wise    MR#:  [id number]  DATE OF BIRTH:  1946/08/05  SUBJECTIVE:   Patient still has cough and shortness of breath denies any chest pains Feeling better  REVIEW OF SYSTEMS:    Review of Systems  Constitutional: Negative for fever, chills weight loss HENT: Negative for ear pain, nosebleeds, congestion, facial swelling, rhinorrhea, neck pain, neck stiffness and ear discharge.   Respiratory: ++for cough, shortness of breath, NO wheezing  Cardiovascular: Negative for chest pain, palpitations and leg swelling.  Gastrointestinal: Negative for heartburn, abdominal pain, vomiting, diarrhea or consitpation Genitourinary: Negative for dysuria, urgency, frequency, hematuria Musculoskeletal: Negative for back pain or joint pain Neurological: Negative for dizziness, seizures, syncope, focal weakness,  numbness and headaches.  Hematological: Does not bruise/bleed easily.  Psychiatric/Behavioral: Negative for hallucinations, confusion, dysphoric mood    Tolerating Diet: yes      DRUG ALLERGIES:  No Known Allergies  VITALS:  Blood pressure 126/72, pulse 81, temperature 97.8 F (36.6 C), temperature source Oral, resp. rate 20, height 5\' 5"  (1.651 m), weight 175 lb (79.4 kg), SpO2 94 %.  PHYSICAL EXAMINATION:  Constitutional: Appears well-developed and well-nourished. No distress. HENT: Normocephalic. Marland Kitchen Oropharynx is clear and moist.  Eyes: Conjunctivae and EOM are normal. PERRLA, no scleral icterus.  Neck: Normal ROM. Neck supple. No JVD. No tracheal deviation. CVS: RRR, S1/S2 +, no murmurs, no gallops, no carotid bruit.  Pulmonary: RLL crackles positive wheezing Abdominal: Soft. BS +,  no distension, tenderness, rebound or guarding.  Musculoskeletal: Normal range of motion. No edema and no tenderness.  Neuro: Alert. CN 2-12 grossly intact. No focal deficits. Skin: Skin is warm and dry. No rash  noted. Psychiatric: Normal mood and affect.      LABORATORY PANEL:   CBC Recent Labs  Lab 10/29/17 0330  WBC 10.3  HGB 11.0*  HCT 33.3*  PLT 291   ------------------------------------------------------------------------------------------------------------------  Chemistries  Recent Labs  Lab 10/28/17 1610  10/30/17 0648  NA 128*   < > 133*  K 5.1   < > 4.7  CL 96*   < > 103  CO2 22   < > 23  GLUCOSE 301*   < > 285*  BUN 15   < > 22*  CREATININE 0.96   < > 0.88  CALCIUM 8.1*   < > 7.9*  AST 23  --   --   ALT 14*  --   --   ALKPHOS 68  --   --   BILITOT 0.6  --   --    < > = values in this interval not displayed.   ------------------------------------------------------------------------------------------------------------------  Cardiac Enzymes Recent Labs  Lab 10/28/17 1610  TROPONINI 0.03*   ------------------------------------------------------------------------------------------------------------------  RADIOLOGY:  No results found.   ASSESSMENT AND PLAN:   72 year old male with diagnosis of influenza about a week ago who presents with cough and shortness of breath.  1.Sepsis with fever, tachycardia and tachypnea with Acute hypoxic respiratory failure due to pneumonia. With normal pro calcitonin level, concern for possible viral pneumonia Continue Levaquin Wean oxygen  2. Acute on chronic COPD acute exacerbation:  Continue IV Solu-Medrol  Continue Pulmicort neb Add Mucinex   3. Influenza A: Patient is out of timeline for Tamiflu. His symptoms started about 2 weeks ago  4. Hyponatremia from poor by mouth intake and pneumonia as well as elevated blood sugar Improved with IV hydration  5. Diabetes: Continue management as  per recommendations by diabetes consultant.  6. Essential hypertension: Continue lisinopril and Norvasc.  7. Hyperlipidemia: Continue statin  8. BPH: Continue tamsulosin Management plans discussed with the patient and he  is in agreement.  CODE STATUS: full  TOTAL TIME TAKING CARE OF THIS PATIENT: 30 minutes.     POSSIBLE D/C tomorrow, DEPENDING ON CLINICAL CONDITION.   Auburn BilberryPATEL, [name] M.D on 10/31/2017 at 12:53 PM  Between 7am to 6pm - Pager - [phone] After 6pm go to [email or web address] - password [employer] [facility]  [facility] [place] [facility]  Office  [phone]  CC: Primary care physician; Rosalio MacadamiaHelman, Niklaus, MD  Note: This dictation was prepared with Dragon dictation along with smaller phrase technology. Any transcriptional errors that result from this process are unintentional.

## 2017-11-01 LAB — GLUCOSE, CAPILLARY
Glucose-Capillary: 216 mg/dL — ABNORMAL HIGH (ref 65–99)
Glucose-Capillary: 216 mg/dL — ABNORMAL HIGH (ref 65–99)
Glucose-Capillary: 296 mg/dL — ABNORMAL HIGH (ref 65–99)

## 2017-11-01 LAB — BASIC METABOLIC PANEL
Anion gap: 12 (ref 5–15)
BUN: 25 mg/dL — ABNORMAL HIGH (ref 6–20)
CALCIUM: 8.3 mg/dL — AB (ref 8.9–10.3)
CO2: 21 mmol/L — AB (ref 22–32)
CREATININE: 0.92 mg/dL (ref 0.61–1.24)
Chloride: 100 mmol/L — ABNORMAL LOW (ref 101–111)
GFR calc Af Amer: >60 mL/min (ref >60)
GFR calc non Af Amer: >60 mL/min (ref >60)
GLUCOSE: 243 mg/dL — AB (ref 65–99)
Potassium: 3.9 mmol/L (ref 3.5–5.1)
Sodium: 133 mmol/L — ABNORMAL LOW (ref 135–145)

## 2017-11-01 MED ORDER — LEVOFLOXACIN 500 MG PO TABS: 500.0000 mg | ORAL_TABLET | Freq: Every day | ORAL | 0 refills | Status: AC

## 2017-11-01 MED ORDER — PREDNISONE 10 MG (21) PO TBPK: ORAL_TABLET | ORAL | 0 refills | Status: DC

## 2017-11-01 MED ORDER — INSULIN REGULAR HUMAN 100 UNIT/ML IJ SOLN
15.0000 [IU] | Freq: Once | INTRAMUSCULAR | Status: AC
  Administered 2017-11-01: 15 [IU] via INTRAVENOUS
  Filled 2017-11-01: qty 0.15

## 2017-11-01 MED ORDER — GUAIFENESIN-CODEINE 100-10 MG/5ML PO SOLN: 15.0000 mL | ORAL | 0 refills | Status: DC | PRN

## 2017-11-01 MED ORDER — IPRATROPIUM-ALBUTEROL 0.5-2.5 (3) MG/3ML IN SOLN: 3.0000 mL | Freq: Four times a day (QID) | RESPIRATORY_TRACT | 2 refills | Status: DC

## 2017-11-01 MED ORDER — GUAIFENESIN ER 600 MG PO TB12: 600.0000 mg | ORAL_TABLET | Freq: Two times a day (BID) | ORAL | 0 refills | Status: AC

## 2017-11-01 NOTE — Progress Notes (Signed)
Inpatient Diabetes Program Recommendations  AACE/ADA: New Consensus Statement on Inpatient Glycemic Control (2015)  Target Ranges:  Prepandial:   less than 140 mg/dL      Peak postprandial:   less than 180 mg/dL (1-2 hours)      Critically ill patients:  140 - 180 mg/dL   Results for Billy Wise, Billy Wise (MRN [id number]) as of 11/01/2017 08:18  Ref. Range 10/31/2017 07:42 10/31/2017 11:36 10/31/2017 16:25 10/31/2017 21:26 10/31/2017 23:49 11/01/2017 02:52 11/01/2017 07:34  Glucose-Capillary Latest Ref Range: 65 - 99 mg/dL 409325 (H) 811433 (H) 914390 (H) 403 (H) 340 (H) 216 (H) 216 (H)   Review of Glycemic Control  Diabetes history: DM2 Outpatient Diabetes medications: Levemir 77 units QHS, Metformin 1000 mg BID, Amaryl 4 mg QAM, Byetta 10 mcg BID Current orders for Inpatient glycemic control: Levemir 82 units QHS, Novolog 0-15 units ACHS, Novolog 8 units TID with meals; Solumedrol 60 mg Q8H  Inpatient Diabetes Program Recommendations:  Insulin - Basal: If steroids continued as ordered, please consider increasing Levemir to 85 units QHS. Insulin - Meal Coverage: If steroids continued as ordered, please consider increasing meal coverage to Novolog 14 units TID with meals.  Thanks, Orlando PennerMarie Bennetta Rudden, RN, MSN, CDE Diabetes Coordinator Inpatient Diabetes Program [phone] (Team Pager from 8am to 5pm)

## 2017-11-01 NOTE — Discharge Summary (Signed)
[facility] [place] [facility]  Billy DeisSteven J Wise, 72 y.o., DOB [birth year], MRN [id number]. Admission date: 10/28/2017 Discharge Date 11/01/2017 Primary MD Rosalio MacadamiaHelman, Veronica, MD Admitting Physician Oralia Manisavid Willis, MD  Admission Diagnosis  Cough [R05] Influenza A [J10.1] Hypoxia [R09.02] Chronic obstructive pulmonary disease, unspecified COPD type [facility](HCC) [J44.9]  Discharge Diagnosis   Principal Problem:   Sepsis (HCC) likely due to viral pneumonia    Influenza A   Acute bronchitis   Acute on chronic COPD exasperation   Diabetes (HCC)   HTN (hypertension)   OSA (obstructive sleep apnea)          Hospital Course  Patient 72 year old with history of COPD presented with complaint of cough shortness of breath.  Patient was admitted for sepsis.  His chest x-ray suggestive of pneumonia.  However his pro calcitonin level was normal therefore he was thought to have viral pneumonia.  Patient continued to be very symptomatic therefore I started him on Levaquin.  He was also treated for acute on chronic COPD exasperation with nebulizer steroids with significant improvement in his symptoms.  Patient will need outpatient pulmonary follow-up for PFTs.  Patient is oxygen remains low with activity so home oxygen has been started.         Consults  pulmonary/intensive care  Significant Tests:  See full reports for all details     Dg Chest 2 View  Result Date: 10/28/2017 CLINICAL DATA:  Cough, shortness of breath and fever. EXAM: CHEST  2 VIEW COMPARISON:  03/01/2016 FINDINGS: The heart size and mediastinal contours are within normal limits. Bronchial thickening present, especially in the lower lung zones bilaterally. This may be consistent with acute bronchitis. No edema, focal airspace consolidation, pneumothorax, nodule or pleural fluid identified. The visualized skeletal structures are unremarkable. IMPRESSION: Bilateral lower lung zone bronchial thickening which may be  consistent with acute bronchitis. Electronically Signed   By: Irish LackGlenn  Yamagata M.D.   On: 10/28/2017 16:48       Today   Subjective:   Billy GrillSteven Wise   patient's breathing is much improved  Objective:   Blood pressure (!) 146/82, pulse 91, temperature 97.8 F (36.6 C), temperature source Oral, resp. rate 18, height 5\' 5"  (1.651 m), weight 175 lb (79.4 kg), SpO2 94 %.  .  Intake/Output Summary (Last 24 hours) at 11/01/2017 1401 Last data filed at 11/01/2017 1313 Gross per 24 hour  Intake 550 ml  Output 2326 ml  Net -1776 ml    Exam VITAL SIGNS: Blood pressure (!) 146/82, pulse 91, temperature 97.8 F (36.6 C), temperature source Oral, resp. rate 18, height 5\' 5"  (1.651 m), weight 175 lb (79.4 kg), SpO2 94 %.  GENERAL:  72 y.o.-year-old patient lying in the bed with no acute distress.  EYES: Pupils equal, round, reactive to light and accommodation. No scleral icterus. Extraocular muscles intact.  HEENT: Head atraumatic, normocephalic. Oropharynx and nasopharynx clear.  NECK:  Supple, no jugular venous distention. No thyroid enlargement, no tenderness.  LUNGS: Normal breath sounds bilaterally, no wheezing, rales,rhonchi or crepitation. No use of accessory muscles of respiration.  CARDIOVASCULAR: S1, S2 normal. No murmurs, rubs, or gallops.  ABDOMEN: Soft, nontender, nondistended. Bowel sounds present. No organomegaly or mass.  EXTREMITIES: No pedal edema, cyanosis, or clubbing.  NEUROLOGIC: Cranial nerves II through XII are intact. Muscle strength 5/5 in all extremities. Sensation intact. Gait not checked.  PSYCHIATRIC: The patient is alert and oriented x 3.  SKIN: No obvious rash, lesion, or ulcer.  Data Review     CBC w Diff:  Lab Results  Component Value Date   WBC 10.3 10/29/2017   HGB 11.0 (L) 10/29/2017   HGB 15.7 03/29/2014   HCT 33.3 (L) 10/29/2017   HCT 45.9 03/29/2014   PLT 291 10/29/2017   PLT 182 03/29/2014   LYMPHOPCT 5 10/28/2017   LYMPHOPCT 23.6  03/29/2014   MONOPCT 3 10/28/2017   MONOPCT 11.6 03/29/2014   EOSPCT 0 10/28/2017   EOSPCT 1.3 03/29/2014   BASOPCT 1 10/28/2017   BASOPCT 1.0 03/29/2014   CMP:  Lab Results  Component Value Date   NA 133 (L) 11/01/2017   NA 128 (L) 03/29/2014   K 3.9 11/01/2017   K 3.5 03/29/2014   CL 100 (L) 11/01/2017   CL 95 (L) 03/29/2014   CO2 21 (L) 11/01/2017   CO2 27 03/29/2014   BUN 25 (H) 11/01/2017   BUN 23 (H) 03/29/2014   CREATININE 0.92 11/01/2017   CREATININE 2.20 (H) 03/29/2014   PROT 7.9 10/28/2017   PROT 8.5 (H) 03/29/2014   ALBUMIN 3.3 (L) 10/28/2017   ALBUMIN 3.4 03/29/2014   BILITOT 0.6 10/28/2017   BILITOT 0.3 03/29/2014   ALKPHOS 68 10/28/2017   ALKPHOS 77 03/29/2014   AST 23 10/28/2017   AST 40 (H) 03/29/2014   ALT 14 (L) 10/28/2017   ALT 32 03/29/2014  .  Micro Results Recent Results (from the past 240 hour(s))  Blood culture (routine x 2)     Status: None (Preliminary result)   Collection Time: 10/28/17  5:13 PM  Result Value Ref Range Status   Specimen Description BLOOD RIGHT ANTECUBITAL  Final   Special Requests   Final    BOTTLES DRAWN AEROBIC AND ANAEROBIC Blood Culture adequate volume   Culture   Final    NO GROWTH 4 DAYS Performed at [facility], [address], [place], [place] [address]    Report Status PENDING  Incomplete  Blood culture (routine x 2)     Status: None (Preliminary result)   Collection Time: 10/28/17  5:15 PM  Result Value Ref Range Status   Specimen Description BLOOD RIGHT ANTECUBITAL  Final   Special Requests   Final    BOTTLES DRAWN AEROBIC AND ANAEROBIC Blood Culture results may not be optimal due to an excessive volume of blood received in culture bottles   Culture   Final    NO GROWTH 4 DAYS Performed at [facility], [address], [place], [place] [address]    Report Status PENDING  Incomplete        Code Status Orders  (From admission, onward)        Start     Ordered   10/28/17  2305  Full code  Continuous     10/28/17 2304    Code Status History    Date Active Date Inactive Code Status Order ID Comments User Context   This patient has a current code status but no historical code status.          Follow-up Information    Rosalio Macadamia, MD Follow up in 1 week(s).   Specialty:  Internal Medicine Why:  hosp f/u Contact information: [address] [facility] [address] [place] [address] [phone]        Shane Crutch, MD Follow up in 2 week(s).   Specialty:  Pulmonary Disease Why:  copd Contact information: [address] [place] [address] [phone]  Discharge Medications   Allergies as of 11/01/2017   No Known Allergies     Medication List    TAKE these medications   acidophilus Caps capsule Take 1 capsule by mouth daily.   amLODipine 10 MG tablet Commonly known as:  NORVASC Take 10 mg by mouth daily.   aspirin EC 81 MG tablet Take 81 mg by mouth daily.   BYETTA 10 MCG PEN 10 MCG/0.04ML Sopn injection Generic drug:  exenatide Inject 10 mcg into the skin 2 (two) times daily with a meal.   finasteride 5 MG tablet Commonly known as:  PROSCAR Take 5 mg by mouth daily.   glimepiride 4 MG tablet Commonly known as:  AMARYL Take 4 mg by mouth daily with breakfast.   guaiFENesin 600 MG 12 hr tablet Commonly known as:  MUCINEX Take 1 tablet (600 mg total) by mouth 2 (two) times daily for 5 days.   guaiFENesin-codeine 100-10 MG/5ML syrup Take 15 mLs by mouth every 4 (four) hours as needed for cough.   ipratropium-albuterol 0.5-2.5 (3) MG/3ML Soln Commonly known as:  DUONEB Take 3 mLs by nebulization every 6 (six) hours.   LEVEMIR FLEXTOUCH 100 UNIT/ML Pen Generic drug:  Insulin Detemir Inject 77 Units into the skin at bedtime.   levofloxacin 500 MG tablet Commonly known as:  LEVAQUIN Take 1 tablet (500 mg total) by mouth daily for 3 days. Start taking on:  11/02/2017    metFORMIN 1000 MG tablet Commonly known as:  GLUCOPHAGE Take 1 tablet by mouth 2 (two) times daily.   naproxen 500 MG tablet Commonly known as:  NAPROSYN Take 500 mg by mouth 2 (two) times daily with a meal.   omeprazole 20 MG capsule Commonly known as:  PRILOSEC Take 20 mg by mouth daily.   oxybutynin 10 MG 24 hr tablet Commonly known as:  DITROPAN-XL Take 10 mg by mouth daily.   polycarbophil 625 MG tablet Commonly known as:  FIBERCON Take 625 mg by mouth every evening.   predniSONE 10 MG (21) Tbpk tablet Commonly known as:  STERAPRED UNI-PAK 21 TAB Start at 60mg  taper by 10mg  until complete   PROAIR HFA 108 (90 Base) MCG/ACT inhaler Generic drug:  albuterol Take 2 puffs by mouth every 4 (four) hours as needed for wheezing.   quinapril 40 MG tablet Commonly known as:  ACCUPRIL Take 40 mg by mouth daily.   simvastatin 20 MG tablet Commonly known as:  ZOCOR Take 20 mg by mouth every evening.   SPIRIVA HANDIHALER 18 MCG inhalation capsule Generic drug:  tiotropium Take 18 mcg by mouth daily.   SYMBICORT 80-4.5 MCG/ACT inhaler Generic drug:  budesonide-formoterol Take 2 puffs by mouth 2 (two) times daily.   tamsulosin 0.4 MG Caps capsule Commonly known as:  FLOMAX Take 1 capsule by mouth at bedtime.   traZODone 50 MG tablet Commonly known as:  DESYREL Take 50 mg by mouth at bedtime.            Durable Medical Equipment  (From admission, onward)        Start     Ordered   11/01/17 1033  For home use only DME Nebulizer machine  Once    Question:  Patient needs a nebulizer to treat with the following condition  Answer:  COPD (chronic obstructive pulmonary disease) (HCC)   11/01/17 1032   11/01/17 1033  For home use only DME oxygen  Once    Question Answer Comment  Mode or (Route) Nasal cannula  Liters per Minute 2   Frequency Continuous (stationary and portable oxygen unit needed)   Oxygen delivery system Gas      11/01/17 1032          Total Time in preparing paper work, data evaluation and todays exam - 35 minutes  [name] M.D on 11/01/2017 at 2:01 [facility]  [facility]   Office  [phone]

## 2017-11-01 NOTE — Care Management Important Message (Signed)
Important Message  Patient Details  Name: Billy Wise MRN: [id number] Date of Birth: 1945/12/26   Medicare Important Message Given:  Yes    Chapman FitchBOWEN, Houda Brau T, RN 11/01/2017, 1:50 PM

## 2017-11-01 NOTE — Progress Notes (Signed)
Spoke with Dr.Willis regarding elevated blood sugar of 403. Orders placed for IV insulin. Ordered to recheck blood glucose at midnight and call MD if greater than 300. Billy Wise,[name] M

## 2017-11-01 NOTE — Discharge Instructions (Signed)
[facility] [place] [facility] ° °DIET:  °Diabetic diet, cardiac diet ° °DISCHARGE CONDITION:  °Stable ° °ACTIVITY:  °Activity as tolerated ° °OXYGEN:  °Home Oxygen: Yes.   °  °Oxygen Delivery: 2 liters/min via Patient connected to nasal cannula oxygen ° °DISCHARGE LOCATION:  °home  ° ° °ADDITIONAL DISCHARGE INSTRUCTION: ° ° °If you experience worsening of your admission symptoms, develop shortness of breath, life threatening emergency, suicidal or homicidal thoughts you must seek medical attention immediately by calling 911 or calling your MD immediately  if symptoms less severe. ° °You Must read complete instructions/literature along with all the possible adverse reactions/side effects for all the Medicines you take and that have been prescribed to you. Take any new Medicines after you have completely understood and accpet all the possible adverse reactions/side effects.  ° °Please note ° °You were cared for by a hospitalist during your hospital stay. If you have any questions about your discharge medications or the care you received while you were in the hospital after you are discharged, you can call the unit and asked to speak with the hospitalist on call if the hospitalist that took care of you is not available. Once you are discharged, your primary care physician will handle any further medical issues. Please note that NO REFILLS for any discharge medications will be authorized once you are discharged, as it is imperative that you return to your primary care physician (or establish a relationship with a primary care physician if you do not have one) for your aftercare needs so that they can reassess your need for medications and monitor your lab values. ° ° °

## 2017-11-01 NOTE — Progress Notes (Signed)
SATURATION QUALIFICATIONS: (This note is used to comply with regulatory documentation for home oxygen)  Patient Saturations on Room Air at Rest = 94%  Patient Saturations on Room Air while Ambulating = 87%  Patient Saturations on 2 Liters of oxygen while Ambulating =90 %

## 2017-11-01 NOTE — Progress Notes (Signed)
Spoke with Dr.Willis about midnight blood glucose check of 340. New orders placed for IV insulin and to recheck blood glucose at 3am. Anselm Junglingonyers,Kateryna Grantham M

## 2017-11-01 NOTE — Care Management (Signed)
Patient admitted with flu, PNA, and COPD exacerbation.  Patient to discharge home today.  Patient with qualifying O2 sats for home O2. Order placed for O2 and nebulizer.  Delivered to room [facility]owerJason [facility]. PCP HELMAN, Kyran .  Per MD patient does not meet home bound criteria for home health.  Patient is independent of ADL, drives, and is still employed.  RNCM signing off.

## 2017-11-01 NOTE — Progress Notes (Signed)
IV was removed. Discharge instructions, follow-up appointments, and prescriptions were provided to the pt. All questions answered. The pt was taken downstairs via wheelchair by RN.  

## 2017-11-02 LAB — CULTURE, BLOOD (ROUTINE X 2)
CULTURE: NO GROWTH
Culture: NO GROWTH
SPECIAL REQUESTS: ADEQUATE

## 2017-11-12 NOTE — Progress Notes (Signed)
[facility] Pulmonary Medicine Consultation      Assessment and Plan:  COPD with dyspnea on exertion and chronic hypoxic respiratory failure -Symptoms of COPD/emphysema. -We will send for pulmonary function test, will refer to pulmonary rehab.  ILD.  -Bibasilar crackles on auscultation today.  No interstitial changes seen on chest x-ray, will send for CT high-resolution.  OSA. -Known obstructive sleep apnea, intolerant of CPAP due to full size mask over nose and mouth. - Discussed the possible ways of sleep apnea can affect his health, will send for new sleep study and treat as indicated.  Orders Placed This Encounter  Procedures  . CT Chest High Resolution  . AMB referral to pulmonary rehabilitation  . Pulmonary Function Test [facility] Only  . Split night study   No Follow-up on file.   Date: 11/15/2017  MRN# [id number] Billy Wise November 05, 1945  Referring Physician: hospitalist doctor.   Billy Wise is a 72 y.o. old male seen in consultation for chief complaint of:    Chief Complaint  Patient presents with  . Hospitalization Follow-up    Pt was admitted to hospital 1/10-1/14/2018. He c/o sob with exertion.    HPI:   The patient is a 72 year old male, he was recently admitted to the hospital on 10/28/17 for 4 days for COPD exacerbation, with influenza A.  He was discharged on home oxygen.  He was diagnosed with COPD about 10 years ago, he lived in [place] and had pulmonary doc there, and then moved here. He noticed that he was having dyspnea with mild or moderate exertion. He underwent a cardiac eval and cath which was negative. His dyspnea has been progressive since that time, and then admitted to the hospital. He was sent home with 2L.   He last smoked about 8 years ago, he smoked about a ppd or more. He is currently on symbicort 2 puff bid and rinses mouth, and spiriva once per day. He does not feel that they are helping much. He uses the oxygen at night not during  the day.  He notes that he was very active, he has trouble walking to his detached garage.   He was started on CPAP for OSA, but could not get used to the mask. He used a full facial mask.   CBC 10/28/17; eosinophils equals 0. Imaging personally reviewed, chest x-ray 10/28/17; lungs are normal  **Desat walk 11/15/17; baseline sat on RA at rest was 91% and HR 89. Walked 360 feet at a very brisk pace, was conversational. Sat was 88% and HR 112.  Moderate dyspnea.   PMHX:   Past Medical History:  Diagnosis Date  . Actinic keratoses   . BPH (benign prostatic hyperplasia)   . CKD stage 1 due to type 2 diabetes mellitus (HCC)   . COPD (chronic obstructive pulmonary disease) (HCC)   . Diabetes mellitus without complication (HCC)   . GERD (gastroesophageal reflux disease)   . Hypertension   . Insomnia   . Iron deficiency anemia   . Mixed hyperlipidemia   . OSA (obstructive sleep apnea)    Surgical Hx:  Past Surgical History:  Procedure Laterality Date  . CARDIAC CATHETERIZATION     Family Hx:  Family History  Problem Relation Age of Onset  . Heart attack Sister   . Hypertension Sister    Social Hx:   Social History   Tobacco Use  . Smoking status: Never Smoker  . Smokeless tobacco: Never Used  Substance Use Topics  .  Alcohol use: No    Frequency: Never  . Drug use: No   Medication:    Current Outpatient Medications:  .  acidophilus (RISAQUAD) CAPS capsule, Take 1 capsule by mouth daily., Disp: , Rfl:  .  amLODipine (NORVASC) 10 MG tablet, Take 10 mg by mouth daily., Disp: , Rfl: 0 .  aspirin EC 81 MG tablet, Take 81 mg by mouth daily., Disp: , Rfl:  .  BYETTA 10 MCG PEN 10 MCG/0.04ML SOPN injection, Inject 10 mcg into the skin 2 (two) times daily with a meal. , Disp: , Rfl: 0 .  finasteride (PROSCAR) 5 MG tablet, Take 5 mg by mouth daily., Disp: , Rfl: 0 .  glimepiride (AMARYL) 4 MG tablet, Take 4 mg by mouth daily with breakfast. , Disp: , Rfl: 0 .  guaiFENesin-codeine  100-10 MG/5ML syrup, Take 15 mLs by mouth every 4 (four) hours as needed for cough., Disp: 120 mL, Rfl: 0 .  ipratropium-albuterol (DUONEB) 0.5-2.5 (3) MG/3ML SOLN, Take 3 mLs by nebulization every 6 (six) hours., Disp: 360 mL, Rfl: 2 .  LEVEMIR FLEXTOUCH 100 UNIT/ML Pen, Inject 77 Units into the skin at bedtime. , Disp: , Rfl: 0 .  metFORMIN (GLUCOPHAGE) 1000 MG tablet, Take 1 tablet by mouth 2 (two) times daily., Disp: , Rfl: 0 .  naproxen (NAPROSYN) 500 MG tablet, Take 500 mg by mouth 2 (two) times daily with a meal., Disp: , Rfl:  .  omeprazole (PRILOSEC) 20 MG capsule, Take 20 mg by mouth daily., Disp: , Rfl: 0 .  oxybutynin (DITROPAN-XL) 10 MG 24 hr tablet, Take 10 mg by mouth daily., Disp: , Rfl: 0 .  polycarbophil (FIBERCON) 625 MG tablet, Take 625 mg by mouth every evening., Disp: , Rfl:  .  PROAIR HFA 108 (90 Base) MCG/ACT inhaler, Take 2 puffs by mouth every 4 (four) hours as needed for wheezing. , Disp: , Rfl: 0 .  quinapril (ACCUPRIL) 40 MG tablet, Take 40 mg by mouth daily., Disp: , Rfl: 0 .  simvastatin (ZOCOR) 20 MG tablet, Take 20 mg by mouth every evening., Disp: , Rfl: 0 .  SPIRIVA HANDIHALER 18 MCG inhalation capsule, Take 18 mcg by mouth daily. , Disp: , Rfl: 0 .  SYMBICORT 80-4.5 MCG/ACT inhaler, Take 2 puffs by mouth 2 (two) times daily., Disp: , Rfl: 0 .  tamsulosin (FLOMAX) 0.4 MG CAPS capsule, Take 1 capsule by mouth at bedtime. , Disp: , Rfl: 0 .  traZODone (DESYREL) 50 MG tablet, Take 50 mg by mouth at bedtime., Disp: , Rfl: 0   Allergies:  Patient has no known allergies.  Review of Systems: Gen:  Denies  fever, sweats, chills HEENT: Denies blurred vision, double vision. bleeds, sore throat Cvc:  No dizziness, chest pain. Resp:   Denies cough or sputum production, shortness of breath Gi: Denies swallowing difficulty, stomach pain. Gu:  Denies bladder incontinence, burning urine Ext:   No Joint pain, stiffness. Skin: No skin rash,  hives  Endoc:  No polyuria,  polydipsia. Psych: No depression, insomnia. Other:  All other systems were reviewed with the patient and were negative other that what is mentioned in the HPI.   Physical Examination:   VS: BP (!) 98/54 (BP Location: Left Arm, Cuff Size: Normal)   Pulse 82   SpO2 94%   General Appearance: No distress  Neuro:without focal findings,  speech normal,  HEENT: PERRLA, EOM intact.  Mallampati 3 Pulmonary: normal breath sounds, No wheezing.  Bibasilar fine inspiratory  crackles. CardiovascularNormal S1,S2.  No m/r/g.   Abdomen: Benign, Soft, non-tender. Renal:  No costovertebral tenderness  GU:  No performed at this time. Endoc: No evident thyromegaly, no signs of acromegaly. Skin:   warm, no rashes, no ecchymosis  Extremities: normal, no cyanosis, clubbing.  Other findings:    LABORATORY PANEL:   CBC No results for input(s): WBC, HGB, HCT, PLT in the last 168 hours. ------------------------------------------------------------------------------------------------------------------  Chemistries  No results for input(s): NA, K, CL, CO2, GLUCOSE, BUN, CREATININE, CALCIUM, MG, AST, ALT, ALKPHOS, BILITOT in the last 168 hours.  Invalid input(s): GFRCGP ------------------------------------------------------------------------------------------------------------------  Cardiac Enzymes No results for input(s): TROPONINI in the last 168 hours. ------------------------------------------------------------  RADIOLOGY:  No results found.     Thank  you for the consultation and for allowing [facility] to assist in the care of your patient. Our recommendations are noted above.  Please contact us if we can be of further service.   Wells Guiles, MD.  Board Certified in Internal Medicine, Pulmonary Medicine, Critical Care Medicine, and Sleep Medicine.  [facility] Office Number: [phone]  Santiago Glad, M.D.  Billy Fischer,  M.D  11/15/2017

## 2017-11-15 ENCOUNTER — Ambulatory Visit: Payer: Medicare Other | Admitting: Internal Medicine

## 2017-11-15 ENCOUNTER — Encounter: Payer: Self-pay | Admitting: Internal Medicine

## 2017-11-15 VITALS — BP 98/54 | HR 82

## 2017-11-15 DIAGNOSIS — J849 Interstitial pulmonary disease, unspecified: Secondary | ICD-10-CM | POA: Diagnosis not present

## 2017-11-15 DIAGNOSIS — J449 Chronic obstructive pulmonary disease, unspecified: Secondary | ICD-10-CM

## 2017-11-15 DIAGNOSIS — G4719 Other hypersomnia: Secondary | ICD-10-CM | POA: Diagnosis not present

## 2017-11-15 NOTE — Patient Instructions (Addendum)
Will send for CT scan and pulmonary function test of your lungs. Will refer you to pulmonary rehab.   Continue inhalers.   Try to increase your physical activity level.   Will send for sleep study.

## 2017-11-16 NOTE — Addendum Note (Signed)
Addended by: Shane CrutchAMACHANDRAN, Dmarco Baldus on: 11/16/2017 10:44 AM   Modules accepted: Orders

## 2017-11-24 ENCOUNTER — Ambulatory Visit: Payer: Medicare Other | Attending: Internal Medicine

## 2017-11-24 DIAGNOSIS — G4761 Periodic limb movement disorder: Secondary | ICD-10-CM | POA: Diagnosis not present

## 2017-11-24 DIAGNOSIS — G4736 Sleep related hypoventilation in conditions classified elsewhere: Secondary | ICD-10-CM | POA: Insufficient documentation

## 2017-11-24 DIAGNOSIS — G4710 Hypersomnia, unspecified: Secondary | ICD-10-CM | POA: Diagnosis present

## 2017-11-26 DIAGNOSIS — G4733 Obstructive sleep apnea (adult) (pediatric): Secondary | ICD-10-CM | POA: Diagnosis not present

## 2017-11-29 ENCOUNTER — Telehealth: Payer: Self-pay | Admitting: *Deleted

## 2017-11-29 ENCOUNTER — Telehealth: Payer: Self-pay | Admitting: Internal Medicine

## 2017-11-29 DIAGNOSIS — IMO0002 Reserved for concepts with insufficient information to code with codable children: Secondary | ICD-10-CM

## 2017-11-29 DIAGNOSIS — G4736 Sleep related hypoventilation in conditions classified elsewhere: Secondary | ICD-10-CM

## 2017-11-29 NOTE — Telephone Encounter (Signed)
Patient aware sleep study neg. Patient aware he need 2L 02 qhs. Orders placed for 02.

## 2017-11-29 NOTE — Telephone Encounter (Signed)
Patient calling to talk about breathing test he was supposed to have prior to starting pulm rehab please call

## 2017-11-29 NOTE — Telephone Encounter (Signed)
LMOAM for pt to return my call.  There is an opening tomorrow 11/30/17 at 7:30 or an 8:30 on 2/19. Waiting on patient to return call. Rhonda J Cobb

## 2017-11-29 NOTE — Telephone Encounter (Signed)
Pt returned call and scheduled [facility], [facility].  Pt given instruction sheet and is aware of appointment date, time, location and instructions. Nothing else needed at this time. Rhonda J Cobb

## 2017-11-30 ENCOUNTER — Ambulatory Visit: Payer: Medicare Other | Attending: Internal Medicine

## 2017-11-30 ENCOUNTER — Encounter: Payer: Medicare Other | Attending: Internal Medicine

## 2017-11-30 ENCOUNTER — Other Ambulatory Visit: Payer: Self-pay

## 2017-11-30 VITALS — Ht 65.25 in | Wt 179.4 lb

## 2017-11-30 DIAGNOSIS — J449 Chronic obstructive pulmonary disease, unspecified: Secondary | ICD-10-CM | POA: Diagnosis not present

## 2017-11-30 DIAGNOSIS — J849 Interstitial pulmonary disease, unspecified: Secondary | ICD-10-CM | POA: Insufficient documentation

## 2017-11-30 MED ORDER — ALBUTEROL SULFATE (2.5 MG/3ML) 0.083% IN NEBU
2.5000 mg | INHALATION_SOLUTION | Freq: Once | RESPIRATORY_TRACT | Status: AC
  Administered 2017-11-30: 2.5 mg via RESPIRATORY_TRACT
  Filled 2017-11-30: qty 3

## 2017-11-30 NOTE — Patient Instructions (Signed)
Patient Instructions  Patient Details  Name: Billy Wise MRN: [id number] Date of Birth: August 10, 1946 Referring Provider:  Shane Crutch, *  Below are your personal goals for exercise, nutrition, and risk factors. Our goal is to help you stay on track towards obtaining and maintaining these goals. We will be discussing your progress on these goals with you throughout the program.  Initial Exercise Prescription: Initial Exercise Prescription - 11/30/17 1100      Date of Initial Exercise RX and Referring Provider   Date  11/30/17    Referring Provider  Nicholos Johns      Treadmill   MPH  2.5    Grade  0.5    Minutes  15    METs  3      Recumbant Bike   Level  3    RPM  60    Watts  28    Minutes  15    METs  3      Arm Ergometer   Level  2    Watts  44    RPM  40    Minutes  15    METs  3      Recumbant Elliptical   Level  2    RPM  60    Minutes  15    METs  3      REL-XR   Level  2    Speed  50    Minutes  15    METs  3      Prescription Details   Frequency (times per week)  3    Duration  Progress to 45 minutes of aerobic exercise without signs/symptoms of physical distress      Intensity   THRR 40-80% of Max Heartrate  118-140    Ratings of Perceived Exertion  11-15    Perceived Dyspnea  0-4      Resistance Training   Training Prescription  Yes    Weight  3 lb    Reps  10-15       Exercise Goals: Frequency: Be able to perform aerobic exercise two to three times per week in program working toward 2-5 days per week of home exercise.  Intensity: Work with a perceived exertion of 11 (fairly light) - 15 (hard) while following your exercise prescription.  We will make changes to your prescription with you as you progress through the program.   Duration: Be able to do 30 to 45 minutes of continuous aerobic exercise in addition to a 5 minute warm-up and a 5 minute cool-down routine.   Nutrition Goals: Your personal nutrition goals will be  established when you do your nutrition analysis with the dietician.  The following are general nutrition guidelines to follow: Cholesterol < /day Sodium < /day Fiber: Men over 50 yrs - 30 grams per day  Personal Goals: Personal Goals and Risk Factors at Admission - 11/30/17 1047      Core Components/Risk Factors/Patient Goals on Admission    Weight Management  Yes;Weight Loss    Intervention  Weight Management: Develop a combined nutrition and exercise program designed to reach desired caloric intake, while maintaining appropriate intake of nutrient and fiber, sodium and fats, and appropriate energy expenditure required for the weight goal.;Weight Management: Provide education and appropriate resources to help participant work on and attain dietary goals.;Weight Management/Obesity: Establish reasonable short term and long term weight goals.    Admit Weight  179 lb 6.4 oz (81.4 kg)  Goal Weight: Short Term  174 lb (78.9 kg)    Goal Weight: Long Term  170 lb (77.1 kg)    Expected Outcomes  Short Term: Continue to assess and modify interventions until short term weight is achieved;Long Term: Adherence to nutrition and physical activity/exercise program aimed toward attainment of established weight goal;Weight Maintenance: Understanding of the daily nutrition guidelines, which includes 25-35% calories from fat, 7% or less cal from saturated fats, less than  cholesterol, less than 1.5gm of sodium, & 5 or more servings of fruits and vegetables daily;Weight Loss: Understanding of general recommendations for a balanced deficit meal plan, which promotes 1-2 lb weight loss per week and includes a negative energy balance of [phone] kcal/d;Understanding recommendations for meals to include 15-35% energy as protein, 25-35% energy from fat, 35-60% energy from carbohydrates, less than  of dietary cholesterol, 20-35 gm of total fiber daily;Understanding of distribution of calorie intake  throughout the day with the consumption of 4-5 meals/snacks    Improve shortness of breath with ADL's  Yes    Intervention  Provide education, individualized exercise plan and daily activity instruction to help decrease symptoms of SOB with activities of daily living.    Expected Outcomes  Short Term: Improve cardiorespiratory fitness to achieve a reduction of symptoms when performing ADLs;Long Term: Be able to perform more ADLs without symptoms or delay the onset of symptoms    Diabetes  Yes    Intervention  Provide education about signs/symptoms and action to take for hypo/hyperglycemia.;Provide education about proper nutrition, including hydration, and aerobic/resistive exercise prescription along with prescribed medications to achieve blood glucose in normal ranges: Fasting glucose 65-99 mg/dL    Expected Outcomes  Short Term: Participant verbalizes understanding of the signs/symptoms and immediate care of hyper/hypoglycemia, proper foot care and importance of medication, aerobic/resistive exercise and nutrition plan for blood glucose control.;Long Term: Attainment of HbA1C < 7%.    Hypertension  Yes    Intervention  Provide education on lifestyle modifcations including regular physical activity/exercise, weight management, moderate sodium restriction and increased consumption of fresh fruit, vegetables, and low fat dairy, alcohol moderation, and smoking cessation.;Monitor prescription use compliance.    Expected Outcomes  Long Term: Maintenance of blood pressure at goal levels.;Short Term: Continued assessment and intervention until BP is < 140/83mm HG in hypertensive participants. < 130/32mm HG in hypertensive participants with diabetes, heart failure or chronic kidney disease.    Lipids  Yes    Intervention  Provide education and support for participant on nutrition & aerobic/resistive exercise along with prescribed medications to achieve LDL 70mg , HDL >40mg .    Expected Outcomes  Short Term:  Participant states understanding of desired cholesterol values and is compliant with medications prescribed. Participant is following exercise prescription and nutrition guidelines.;Long Term: Cholesterol controlled with medications as prescribed, with individualized exercise RX and with personalized nutrition plan. Value goals: LDL < , HDL > 40 mg.       Tobacco Use Initial Evaluation: Social History   Tobacco Use  Smoking Status Former Smoker  . Packs/day: 1.50  . Years: 50.00  . Pack years: 75.00  . Types: Cigarettes  . Last attempt to quit: 09/29/2008  . Years since quitting: 9.1  Smokeless Tobacco Never Used    Exercise Goals and Review: Exercise Goals    Row Name 11/30/17 1158             Exercise Goals   Increase Physical Activity  Yes       Intervention  Provide advice, education, support and counseling about physical activity/exercise needs.;Develop an individualized exercise prescription for aerobic and resistive training based on initial evaluation findings, risk stratification, comorbidities and participant's personal goals.       Expected Outcomes  Short Term: Attend rehab on a regular basis to increase amount of physical activity.;Long Term: Add in home exercise to make exercise part of routine and to increase amount of physical activity.;Long Term: Exercising regularly at least 3-5 days a week.       Increase Strength and Stamina  Yes       Intervention  Provide advice, education, support and counseling about physical activity/exercise needs.;Develop an individualized exercise prescription for aerobic and resistive training based on initial evaluation findings, risk stratification, comorbidities and participant's personal goals.       Expected Outcomes  Short Term: Increase workloads from initial exercise prescription for resistance, speed, and METs.;Short Term: Perform resistance training exercises routinely during rehab and add in resistance training at home;Long  Term: Improve cardiorespiratory fitness, muscular endurance and strength as measured by increased METs and functional capacity ( )       Able to understand and use rate of perceived exertion (RPE) scale  Yes       Intervention  Provide education and explanation on how to use RPE scale       Expected Outcomes  Short Term: Able to use RPE daily in rehab to express subjective intensity level;Long Term:  Able to use RPE to guide intensity level when exercising independently       Able to understand and use Dyspnea scale  Yes       Intervention  Provide education and explanation on how to use Dyspnea scale       Expected Outcomes  Short Term: Able to use Dyspnea scale daily in rehab to express subjective sense of shortness of breath during exertion;Long Term: Able to use Dyspnea scale to guide intensity level when exercising independently       Knowledge and understanding of Target Heart Rate Range (THRR)  Yes       Intervention  Provide education and explanation of THRR including how the numbers were predicted and where they are located for reference       Expected Outcomes  Short Term: Able to state/look up THRR;Long Term: Able to use THRR to govern intensity when exercising independently;Short Term: Able to use daily as guideline for intensity in rehab       Able to check pulse independently  Yes       Intervention  Provide education and demonstration on how to check pulse in carotid and radial arteries.;Review the importance of being able to check your own pulse for safety during independent exercise       Expected Outcomes  Short Term: Able to explain why pulse checking is important during independent exercise;Long Term: Able to check pulse independently and accurately       Understanding of Exercise Prescription  Yes       Intervention  Provide education, explanation, and written materials on patient's individual exercise prescription       Expected Outcomes  Short Term: Able to explain program  exercise prescription;Long Term: Able to explain home exercise prescription to exercise independently          Copy of goals given to participant.

## 2017-11-30 NOTE — Progress Notes (Signed)
Pulmonary Individual Treatment Plan  Patient Details  Name: Billy Wise MRN: [id number] Date of Birth: 06-Aug-1946 Referring Provider:     Pulmonary Rehab from 11/30/2017 in [facility] Cardiac and Pulmonary Rehab  Referring Provider  Ramachandran      Initial Encounter Date:    Pulmonary Rehab from 11/30/2017 in [facility] Cardiac and Pulmonary Rehab  Date  11/30/17  Referring Provider  Ashby Dawes      Visit Diagnosis: COPD with chronic bronchitis and emphysema ([place])  Patient's Home Medications on Admission:  Current Outpatient Medications:  .  acidophilus (RISAQUAD) CAPS capsule, Take 1 capsule by mouth daily., Disp: , Rfl:  .  amLODipine (NORVASC) 10 MG tablet, Take 10 mg by mouth daily., Disp: , Rfl: 0 .  aspirin EC 81 MG tablet, Take 81 mg by mouth daily., Disp: , Rfl:  .  BYETTA 10 MCG PEN 10 MCG/0.04ML SOPN injection, Inject 10 mcg into the skin 2 (two) times daily with a meal. , Disp: , Rfl: 0 .  finasteride (PROSCAR) 5 MG tablet, Take 5 mg by mouth daily., Disp: , Rfl: 0 .  glimepiride (AMARYL) 4 MG tablet, Take 4 mg by mouth daily with breakfast. , Disp: , Rfl: 0 .  guaiFENesin-codeine 100-10 MG/5ML syrup, Take 15 mLs by mouth every 4 (four) hours as needed for cough., Disp: 120 mL, Rfl: 0 .  ipratropium-albuterol (DUONEB) 0.5-2.5 (3) MG/3ML SOLN, Take 3 mLs by nebulization every 6 (six) hours., Disp: 360 mL, Rfl: 2 .  LEVEMIR FLEXTOUCH 100 UNIT/ML Pen, Inject 77 Units into the skin at bedtime. , Disp: , Rfl: 0 .  metFORMIN (GLUCOPHAGE) 1000 MG tablet, Take 1 tablet by mouth 2 (two) times daily., Disp: , Rfl: 0 .  naproxen (NAPROSYN) 500 MG tablet, Take 500 mg by mouth 2 (two) times daily with a meal., Disp: , Rfl:  .  omeprazole (PRILOSEC) 20 MG capsule, Take 20 mg by mouth daily., Disp: , Rfl: 0 .  oxybutynin (DITROPAN-XL) 10 MG 24 hr tablet, Take 10 mg by mouth daily., Disp: , Rfl: 0 .  polycarbophil (FIBERCON) 625 MG tablet, Take 625 mg by mouth every evening., Disp: , Rfl:   .  PROAIR HFA 108 (90 Base) MCG/ACT inhaler, Take 2 puffs by mouth every 4 (four) hours as needed for wheezing. , Disp: , Rfl: 0 .  quinapril (ACCUPRIL) 40 MG tablet, Take 40 mg by mouth daily., Disp: , Rfl: 0 .  simvastatin (ZOCOR) 20 MG tablet, Take 20 mg by mouth every evening., Disp: , Rfl: 0 .  SPIRIVA HANDIHALER 18 MCG inhalation capsule, Take 18 mcg by mouth daily. , Disp: , Rfl: 0 .  SYMBICORT 80-4.5 MCG/ACT inhaler, Take 2 puffs by mouth 2 (two) times daily., Disp: , Rfl: 0 .  tamsulosin (FLOMAX) 0.4 MG CAPS capsule, Take 1 capsule by mouth at bedtime. , Disp: , Rfl: 0 .  traZODone (DESYREL) 50 MG tablet, Take 50 mg by mouth at bedtime., Disp: , Rfl: 0  Past Medical History: Past Medical History:  Diagnosis Date  . Actinic keratoses   . BPH (benign prostatic hyperplasia)   . CKD stage 1 due to type 2 diabetes mellitus ([place])   . COPD (chronic obstructive pulmonary disease) ([place])   . Diabetes mellitus without complication ([place])   . GERD (gastroesophageal reflux disease)   . Hypertension   . Insomnia   . Iron deficiency anemia   . Mixed hyperlipidemia   . OSA (obstructive sleep apnea)     Tobacco  Use: Social History   Tobacco Use  Smoking Status Former Smoker  . Packs/day: 1.50  . Years: 50.00  . Pack years: 75.00  . Types: Cigarettes  . Last attempt to quit: 09/29/2008  . Years since quitting: 9.1  Smokeless Tobacco Never Used    Labs: Recent Review Scientist, physiological    Labs for ITP Cardiac and Pulmonary Rehab Latest Ref Rng & Units 10/29/2017 10/29/2017   Hemoglobin A1c 4.8 - 5.6 % 7.2(H) 7.2(H)       Pulmonary Assessment Scores: Pulmonary Assessment Scores    Row Name 11/30/17 1147         ADL UCSD   ADL Phase  Entry     SOB Score total  79     Rest  1     Walk  1     Stairs  5     Bath  2     Dress  3     Shop  4       CAT Score   CAT Score  26       mMRC Score   mMRC Score  1        Pulmonary Function Assessment: Pulmonary Function  Assessment - 11/30/17 1038      Pulmonary Function Tests   FVC%  88 % Date performed 11/30/17 [facility]    FEV1%  48 %    FEV1/FVC Ratio  43      Breath   Bilateral Breath Sounds  Clear    Shortness of Breath  Yes;Fear of Shortness of Breath;Limiting activity;Panic with Shortness of Breath       Exercise Target Goals: Date: 11/30/17  Exercise Program Goal: Individual exercise prescription set using results from initial 6 min walk test and THRR while considering  patient's activity barriers and safety.    Exercise Prescription Goal: Initial exercise prescription builds to 30-45 minutes a day of aerobic activity, 2-3 days per week.  Home exercise guidelines will be given to patient during program as part of exercise prescription that the participant will acknowledge.  Activity Barriers & Risk Stratification:   6 Minute Walk: 6 Minute Walk    Row Name 11/30/17 1141         6 Minute Walk   Phase  Initial     Distance  1350 feet     Walk Time  6 minutes     # of Rest Breaks  0     MPH  2.55     METS  3.09     RPE  11     Perceived Dyspnea   2     VO2 Peak  10.84     Symptoms  No     Resting HR  96 bpm     Resting BP  100/56     Resting Oxygen Saturation   90 %     Exercise Oxygen Saturation  during 6 min walk  85 %     Max Ex. HR  126 bpm     Max Ex. BP  126/54     2 Minute Post BP  110/58       Interval HR   1 Minute HR  113     2 Minute HR  115     3 Minute HR  117     4 Minute HR  117     5 Minute HR  117     6 Minute HR  126  2 Minute Post HR  97     Interval Heart Rate?  Yes       Interval Oxygen   Interval Oxygen?  Yes     Baseline Oxygen Saturation %  90 %     1 Minute Oxygen Saturation %  87 %     1 Minute Liters of Oxygen  0 L     2 Minute Oxygen Saturation %  85 %     2 Minute Liters of Oxygen  0 L     3 Minute Oxygen Saturation %  85 %     3 Minute Liters of Oxygen  0 L     4 Minute Oxygen Saturation %  86 %     4 Minute Liters of Oxygen  0 L      5 Minute Oxygen Saturation %  87 %     5 Minute Liters of Oxygen  0 L     6 Minute Oxygen Saturation %  86 %     6 Minute Liters of Oxygen  0 L     2 Minute Post Oxygen Saturation %  93 %     2 Minute Post Liters of Oxygen  0 L       Oxygen Initial Assessment: Oxygen Initial Assessment - 11/30/17 1046      Home Oxygen   Home Oxygen Device  Home Concentrator;E-Tanks    Sleep Oxygen Prescription  Continuous    Liters per minute  2    Home Exercise Oxygen Prescription  None    Home at Rest Exercise Oxygen Prescription  None    Compliance with Home Oxygen Use  Yes      Initial 6 min Walk   Oxygen Used  None      Program Oxygen Prescription   Program Oxygen Prescription  None      Intervention   Short Term Goals  To learn and exhibit compliance with exercise, home and travel O2 prescription;To learn and understand importance of maintaining oxygen saturations>88%;To learn and demonstrate proper use of respiratory medications;To learn and understand importance of monitoring SPO2 with pulse oximeter and demonstrate accurate use of the pulse oximeter.;To learn and demonstrate proper pursed lip breathing techniques or other breathing techniques.    Long  Term Goals  Exhibits compliance with exercise, home and travel O2 prescription;Verbalizes importance of monitoring SPO2 with pulse oximeter and return demonstration;Maintenance of O2 saturations>88%;Exhibits proper breathing techniques, such as pursed lip breathing or other method taught during program session;Compliance with respiratory medication;Demonstrates proper use of MDI's       Oxygen Re-Evaluation:   Oxygen Discharge (Final Oxygen Re-Evaluation):   Initial Exercise Prescription: Initial Exercise Prescription - 11/30/17 1100      Date of Initial Exercise RX and Referring Provider   Date  11/30/17    Referring Provider  Ramachandran      Treadmill   MPH  2.5    Grade  0.5    Minutes  15    METs  3      Recumbant  Bike   Level  3    RPM  60    Watts  28    Minutes  15    METs  3      Arm Ergometer   Level  2    Watts  44    RPM  40    Minutes  15    METs  3      Recumbant Elliptical  Level  2    RPM  60    Minutes  15    METs  3      REL-XR   Level  2    Speed  50    Minutes  15    METs  3      Prescription Details   Frequency (times per week)  3    Duration  Progress to 45 minutes of aerobic exercise without signs/symptoms of physical distress      Intensity   THRR 40-80% of Max Heartrate  118-140    Ratings of Perceived Exertion  11-15    Perceived Dyspnea  0-4      Resistance Training   Training Prescription  Yes    Weight  3 lb    Reps  10-15       Perform Capillary Blood Glucose checks as needed.  Exercise Prescription Changes:   Exercise Comments:   Exercise Goals and Review:  Exercise Goals    Row Name 11/30/17 1158             Exercise Goals   Increase Physical Activity  Yes       Intervention  Provide advice, education, support and counseling about physical activity/exercise needs.;Develop an individualized exercise prescription for aerobic and resistive training based on initial evaluation findings, risk stratification, comorbidities and participant's personal goals.       Expected Outcomes  Short Term: Attend rehab on a regular basis to increase amount of physical activity.;Long Term: Add in home exercise to make exercise part of routine and to increase amount of physical activity.;Long Term: Exercising regularly at least 3-5 days a week.       Increase Strength and Stamina  Yes       Intervention  Provide advice, education, support and counseling about physical activity/exercise needs.;Develop an individualized exercise prescription for aerobic and resistive training based on initial evaluation findings, risk stratification, comorbidities and participant's personal goals.       Expected Outcomes  Short Term: Increase workloads from initial exercise  prescription for resistance, speed, and METs.;Short Term: Perform resistance training exercises routinely during rehab and add in resistance training at home;Long Term: Improve cardiorespiratory fitness, muscular endurance and strength as measured by increased METs and functional capacity (6MWT)       Able to understand and use rate of perceived exertion (RPE) scale  Yes       Intervention  Provide education and explanation on how to use RPE scale       Expected Outcomes  Short Term: Able to use RPE daily in rehab to express subjective intensity level;Long Term:  Able to use RPE to guide intensity level when exercising independently       Able to understand and use Dyspnea scale  Yes       Intervention  Provide education and explanation on how to use Dyspnea scale       Expected Outcomes  Short Term: Able to use Dyspnea scale daily in rehab to express subjective sense of shortness of breath during exertion;Long Term: Able to use Dyspnea scale to guide intensity level when exercising independently       Knowledge and understanding of Target Heart Rate Range (THRR)  Yes       Intervention  Provide education and explanation of THRR including how the numbers were predicted and where they are located for reference       Expected Outcomes  Short Term: Able to state/look up THRR;Long  Term: Able to use THRR to govern intensity when exercising independently;Short Term: Able to use daily as guideline for intensity in rehab       Able to check pulse independently  Yes       Intervention  Provide education and demonstration on how to check pulse in carotid and radial arteries.;Review the importance of being able to check your own pulse for safety during independent exercise       Expected Outcomes  Short Term: Able to explain why pulse checking is important during independent exercise;Long Term: Able to check pulse independently and accurately       Understanding of Exercise Prescription  Yes       Intervention   Provide education, explanation, and written materials on patient's individual exercise prescription       Expected Outcomes  Short Term: Able to explain program exercise prescription;Long Term: Able to explain home exercise prescription to exercise independently          Exercise Goals Re-Evaluation :   Discharge Exercise Prescription (Final Exercise Prescription Changes):   Nutrition:  Target Goals: Understanding of nutrition guidelines, daily intake of sodium '1500mg'$ , cholesterol '200mg'$ , calories 30% from fat and 7% or less from saturated fats, daily to have 5 or more servings of fruits and vegetables.  Biometrics: Pre Biometrics - 11/30/17 1144      Pre Biometrics   Height  5' 5.25" (1.657 m)    Weight  179 lb 6.4 oz (81.4 kg)    Waist Circumference  41.5 inches    Hip Circumference  42 inches    Waist to Hip Ratio  0.99 %    BMI (Calculated)  29.64        Nutrition Therapy Plan and Nutrition Goals: Nutrition Therapy & Goals - 11/30/17 1037      Personal Nutrition Goals   Comments  eat healthier and lose some wieght.      Intervention Plan   Intervention  Prescribe, educate and counsel regarding individualized specific dietary modifications aiming towards targeted core components such as weight, hypertension, lipid management, diabetes, heart failure and other comorbidities.;Nutrition handout(s) given to patient.    Expected Outcomes  Short Term Goal: Understand basic principles of dietary content, such as calories, fat, sodium, cholesterol and nutrients.;Short Term Goal: A plan has been developed with personal nutrition goals set during dietitian appointment.;Long Term Goal: Adherence to prescribed nutrition plan.       Nutrition Assessments: Nutrition Assessments - 11/30/17 1035      MEDFICTS Scores   Pre Score  108       Nutrition Goals Re-Evaluation:   Nutrition Goals Discharge (Final Nutrition Goals Re-Evaluation):   Psychosocial: Target Goals:  Acknowledge presence or absence of significant depression and/or stress, maximize coping skills, provide positive support system. Participant is able to verbalize types and ability to use techniques and skills needed for reducing stress and depression.   Initial Review & Psychosocial Screening: Initial Psych Review & Screening - 11/30/17 1032      Initial Review   Current issues with  Current Sleep Concerns;Current Stress Concerns    Source of Stress Concerns  Chronic Illness;Unable to perform yard/household activities;Unable to participate in former interests or hobbies    Comments  His yard work got to be too much for him, he cant swim like he wants to. He lost 10 pounds recently and has not exercised in many years.      Family Dynamics   Good Support System?  Yes  Comments  He looks to his wife and his daughter inlaw for support      Barriers   Psychosocial barriers to participate in program  The patient should benefit from training in stress management and relaxation.      Screening Interventions   Interventions  Program counselor consult;Encouraged to exercise;Provide feedback about the scores to participant;To provide support and resources with identified psychosocial needs    Expected Outcomes  Short Term goal: Utilizing psychosocial counselor, staff and physician to assist with identification of specific Stressors or current issues interfering with healing process. Setting desired goal for each stressor or current issue identified.;Long Term goal: The participant improves quality of Life and PHQ9 Scores as seen by post scores and/or verbalization of changes;Short Term goal: Identification and review with participant of any Quality of Life or Depression concerns found by scoring the questionnaire.;Long Term Goal: Stressors or current issues are controlled or eliminated.       Quality of Life Scores:  Scores of 19 and below usually indicate a poorer quality of life in these areas.  A  difference of  2-3 points is a clinically meaningful difference.  A difference of 2-3 points in the total score of the Quality of Life Index has been associated with significant improvement in overall quality of life, self-image, physical symptoms, and general health in studies assessing change in quality of life.  PHQ-9: Recent Review Flowsheet Data    Depression screen [facility] 2/9 11/30/2017   Decreased Interest 3   Down, Depressed, Hopeless 0   PHQ - 2 Score 3   Altered sleeping 3   Tired, decreased energy 3   Change in appetite 0   Feeling bad or failure about yourself  0   Trouble concentrating 0   Moving slowly or fidgety/restless 0   Suicidal thoughts 0   PHQ-9 Score 9   Difficult doing work/chores Not difficult at all     Interpretation of Total Score  Total Score Depression Severity:  1-4 = Minimal depression, 5-9 = Mild depression, 10-14 = Moderate depression, 15-19 = Moderately severe depression, 20-27 = Severe depression   Psychosocial Evaluation and Intervention:   Psychosocial Re-Evaluation:   Psychosocial Discharge (Final Psychosocial Re-Evaluation):   Education: Education Goals: Education classes will be provided on a weekly basis, covering required topics. Participant will state understanding/return demonstration of topics presented.  Learning Barriers/Preferences: Learning Barriers/Preferences - 11/30/17 1039      Learning Barriers/Preferences   Learning Barriers  Sight wears glasses    Learning Preferences  None       Education Topics:  Initial Evaluation Education: - Verbal, written and demonstration of respiratory meds, oximetry and breathing techniques. Instruction on use of nebulizers and MDIs and importance of monitoring MDI activations.   Pulmonary Rehab from 11/30/2017 in [facility] Cardiac and Pulmonary Rehab  Date  11/30/17  Educator  [facility]  Instruction Review Code  1- Verbalizes Understanding      General Nutrition Guidelines/Fats and  Fiber: -Group instruction provided by verbal, written material, models and posters to present the general guidelines for heart healthy nutrition. Gives an explanation and review of dietary fats and fiber.   Controlling Sodium/Reading Food Labels: -Group verbal and written material supporting the discussion of sodium use in heart healthy nutrition. Review and explanation with models, verbal and written materials for utilization of the food label.   Exercise Physiology & General Exercise Guidelines: - Group verbal and written instruction with models to review the exercise physiology of the cardiovascular  system and associated critical values. Provides general exercise guidelines with specific guidelines to those with heart or lung disease.    Aerobic Exercise & Resistance Training: - Gives group verbal and written instruction on the various components of exercise. Focuses on aerobic and resistive training programs and the benefits of this training and how to safely progress through these programs.   Flexibility, Balance, Mind/Body Relaxation: Provides group verbal/written instruction on the benefits of flexibility and balance training, including mind/body exercise modes such as yoga, pilates and tai chi.  Demonstration and skill practice provided.   Stress and Anxiety: - Provides group verbal and written instruction about the health risks of elevated stress and causes of high stress.  Discuss the correlation between heart/lung disease and anxiety and treatment options. Review healthy ways to manage with stress and anxiety.   Depression: - Provides group verbal and written instruction on the correlation between heart/lung disease and depressed mood, treatment options, and the stigmas associated with seeking treatment.   Exercise & Equipment Safety: - Individual verbal instruction and demonstration of equipment use and safety with use of the equipment.   Pulmonary Rehab from 11/30/2017 in  [facility] Cardiac and Pulmonary Rehab  Date  11/30/17  Educator  [facility]  Instruction Review Code  1- Verbalizes Understanding      Infection Prevention: - Provides verbal and written material to individual with discussion of infection control including proper hand washing and proper equipment cleaning during exercise session.   Pulmonary Rehab from 11/30/2017 in [facility] Cardiac and Pulmonary Rehab  Date  11/30/17  Educator  [facility]  Instruction Review Code  1- Verbalizes Understanding      Falls Prevention: - Provides verbal and written material to individual with discussion of falls prevention and safety.   Pulmonary Rehab from 11/30/2017 in [facility] Cardiac and Pulmonary Rehab  Date  11/30/17  Educator  [facility]  Instruction Review Code  1- Verbalizes Understanding      Diabetes: - Individual verbal and written instruction to review signs/symptoms of diabetes, desired ranges of glucose level fasting, after meals and with exercise. Advice that pre and post exercise glucose checks will be done for 3 sessions at entry of program.   Chronic Lung Diseases: - Group verbal and written instruction to review updates, respiratory medications, advancements in procedures and treatments. Discuss use of supplemental oxygen including available portable oxygen systems, continuous and intermittent flow rates, concentrators, personal use and safety guidelines. Review proper use of inhaler and spacers. Provide informative websites for self-education.    Energy Conservation: - Provide group verbal and written instruction for methods to conserve energy, plan and organize activities. Instruct on pacing techniques, use of adaptive equipment and posture/positioning to relieve shortness of breath.   Triggers and Exacerbations: - Group verbal and written instruction to review types of environmental triggers and ways to prevent exacerbations. Discuss weather changes, air quality and the benefits of nasal washing. Review warning  signs and symptoms to help prevent infections. Discuss techniques for effective airway clearance, coughing, and vibrations.   AED/CPR: - Group verbal and written instruction with the use of models to demonstrate the basic use of the AED with the basic ABC's of resuscitation.   Anatomy and Physiology of the Lungs: - Group verbal and written instruction with the use of models to provide basic lung anatomy and physiology related to function, structure and complications of lung disease.   Anatomy & Physiology of the Heart: - Group verbal and written instruction and models provide basic cardiac anatomy  and physiology, with the coronary electrical and arterial systems. Review of Valvular disease and Heart Failure   Cardiac Medications: - Group verbal and written instruction to review commonly prescribed medications for heart disease. Reviews the medication, class of the drug, and side effects.   Know Your Numbers and Risk Factors: -Group verbal and written instruction about important numbers in your health.  Discussion of what are risk factors and how they play a role in the disease process.  Review of Cholesterol, Blood Pressure, Diabetes, and BMI and the role they play in your overall health.   Sleep Hygiene: -Provides group verbal and written instruction about how sleep can affect your health.  Define sleep hygiene, discuss sleep cycles and impact of sleep habits. Review good sleep hygiene tips.    Other: -Provides group and verbal instruction on various topics (see comments)    Knowledge Questionnaire Score: Knowledge Questionnaire Score - 11/30/17 1039      Knowledge Questionnaire Score   Pre Score  15/18 reviewed with patient        Core Components/Risk Factors/Patient Goals at Admission: Personal Goals and Risk Factors at Admission - 11/30/17 1047      Core Components/Risk Factors/Patient Goals on Admission    Weight Management  Yes;Weight Loss    Intervention  Weight  Management: Develop a combined nutrition and exercise program designed to reach desired caloric intake, while maintaining appropriate intake of nutrient and fiber, sodium and fats, and appropriate energy expenditure required for the weight goal.;Weight Management: Provide education and appropriate resources to help participant work on and attain dietary goals.;Weight Management/Obesity: Establish reasonable short term and long term weight goals.    Admit Weight  179 lb 6.4 oz (81.4 kg)    Goal Weight: Short Term  174 lb (78.9 kg)    Goal Weight: Long Term  170 lb (77.1 kg)    Expected Outcomes  Short Term: Continue to assess and modify interventions until short term weight is achieved;Long Term: Adherence to nutrition and physical activity/exercise program aimed toward attainment of established weight goal;Weight Maintenance: Understanding of the daily nutrition guidelines, which includes 25-35% calories from fat, 7% or less cal from saturated fats, less than '200mg'$  cholesterol, less than 1.5gm of sodium, & 5 or more servings of fruits and vegetables daily;Weight Loss: Understanding of general recommendations for a balanced deficit meal plan, which promotes 1-2 lb weight loss per week and includes a negative energy balance of [phone] kcal/d;Understanding recommendations for meals to include 15-35% energy as protein, 25-35% energy from fat, 35-60% energy from carbohydrates, less than '200mg'$  of dietary cholesterol, 20-35 gm of total fiber daily;Understanding of distribution of calorie intake throughout the day with the consumption of 4-5 meals/snacks    Improve shortness of breath with ADL's  Yes    Intervention  Provide education, individualized exercise plan and daily activity instruction to help decrease symptoms of SOB with activities of daily living.    Expected Outcomes  Short Term: Improve cardiorespiratory fitness to achieve a reduction of symptoms when performing ADLs;Long Term: Be able to perform more  ADLs without symptoms or delay the onset of symptoms    Diabetes  Yes    Intervention  Provide education about signs/symptoms and action to take for hypo/hyperglycemia.;Provide education about proper nutrition, including hydration, and aerobic/resistive exercise prescription along with prescribed medications to achieve blood glucose in normal ranges: Fasting glucose 65-99 mg/dL    Expected Outcomes  Short Term: Participant verbalizes understanding of the signs/symptoms and immediate care of  hyper/hypoglycemia, proper foot care and importance of medication, aerobic/resistive exercise and nutrition plan for blood glucose control.;Long Term: Attainment of HbA1C < 7%.    Hypertension  Yes    Intervention  Provide education on lifestyle modifcations including regular physical activity/exercise, weight management, moderate sodium restriction and increased consumption of fresh fruit, vegetables, and low fat dairy, alcohol moderation, and smoking cessation.;Monitor prescription use compliance.    Expected Outcomes  Long Term: Maintenance of blood pressure at goal levels.;Short Term: Continued assessment and intervention until BP is < 140/27m HG in hypertensive participants. < 130/846mHG in hypertensive participants with diabetes, heart failure or chronic kidney disease.    Lipids  Yes    Intervention  Provide education and support for participant on nutrition & aerobic/resistive exercise along with prescribed medications to achieve LDL '70mg'$ , HDL >'40mg'$ .    Expected Outcomes  Short Term: Participant states understanding of desired cholesterol values and is compliant with medications prescribed. Participant is following exercise prescription and nutrition guidelines.;Long Term: Cholesterol controlled with medications as prescribed, with individualized exercise RX and with personalized nutrition plan. Value goals: LDL < '70mg'$ , HDL > 40 mg.       Core Components/Risk Factors/Patient Goals Review:    Core  Components/Risk Factors/Patient Goals at Discharge (Final Review):    ITP Comments: ITP Comments    Row Name 11/30/17 1138           ITP Comments  Medical Evaluation completed. Chart sent for review and changes to Dr. MaEmily Filbertirector of [facility]          Comments: Initial ITP

## 2017-12-01 ENCOUNTER — Telehealth: Payer: Self-pay | Admitting: Internal Medicine

## 2017-12-01 NOTE — Telephone Encounter (Signed)
Any advise for this pt other than to continue to try and use the oxygen more nights since he just received it.

## 2017-12-01 NOTE — Telephone Encounter (Signed)
Any reason he is having trouble with it? If it is causing dryness he could have humidity added and use saline nasal spray.

## 2017-12-01 NOTE — Telephone Encounter (Signed)
Pt states that he is having difficulty sleeping with his oxygen, and would like to discuss this with a nurse. Please call.

## 2017-12-02 NOTE — Telephone Encounter (Signed)
Pt states he slept better last night and he thinks he has the issue solved. The tubing was too long and it was drying him out. He shortened the hose and is doing better. Informed to call back with any other issues. Nothing further needed.

## 2017-12-10 DIAGNOSIS — J849 Interstitial pulmonary disease, unspecified: Secondary | ICD-10-CM | POA: Diagnosis not present

## 2017-12-10 DIAGNOSIS — J449 Chronic obstructive pulmonary disease, unspecified: Secondary | ICD-10-CM

## 2017-12-10 LAB — GLUCOSE, CAPILLARY
GLUCOSE-CAPILLARY: 159 mg/dL — AB (ref 65–99)
Glucose-Capillary: 117 mg/dL — ABNORMAL HIGH (ref 65–99)

## 2017-12-10 NOTE — Progress Notes (Signed)
Daily Session Note  Patient Details  Name: Billy Wise MRN: [id number] Date of Birth: November 27, 1945 Referring Provider:     Pulmonary Rehab from 11/30/2017 in [facility] Cardiac and Pulmonary Rehab  Referring Provider  Ramachandran      Encounter Date: 12/10/2017  Check In: Session Check In - 12/10/17 1037      Check-In   Location  [facility]-Cardiac & Pulmonary Rehab    Staff Present  Renita Papa, RN BSN;Mandi Pattison, BS, PEC;Demarious Kapur [place]    Supervising physician immediately available to respond to emergencies  [facility] immediately available ER MD    Physician(s)  Dr. Jacqualine Code and [facility]    Medication changes reported      No    Fall or balance concerns reported     No    Warm-up and Cool-down  Performed as group-led instruction    Resistance Training Performed  Yes    VAD Patient?  No      Pain Assessment   Currently in Pain?  No/denies          Social History   Tobacco Use  Smoking Status Former Smoker  . Packs/day: 1.50  . Years: 50.00  . Pack years: 75.00  . Types: Cigarettes  . Last attempt to quit: 09/29/2008  . Years since quitting: 9.2  Smokeless Tobacco Never Used    Goals Met:  Exercise tolerated well Personal goals reviewed Queuing for purse lip breathing No report of cardiac concerns or symptoms Strength training completed today  Goals Unmet:  Not Applicable  Comments: First full day of exercise!  Patient was oriented to gym and equipment including functions, settings, policies, and procedures.  Patient's individual exercise prescription and treatment plan were reviewed.  All starting workloads were established based on the results of the 6 minute walk test done at initial orientation visit.  The plan for exercise progression was also introduced and progression will be customized based on patient's performance and goals. Pt able to follow exercise prescription today without complaint.  Will continue to monitor for progression.   Dr.  Emily Filbert is Medical Director for [place] and [facility].

## 2017-12-13 ENCOUNTER — Encounter: Payer: Medicare Other | Admitting: *Deleted

## 2017-12-13 DIAGNOSIS — J449 Chronic obstructive pulmonary disease, unspecified: Secondary | ICD-10-CM

## 2017-12-13 DIAGNOSIS — J849 Interstitial pulmonary disease, unspecified: Secondary | ICD-10-CM | POA: Diagnosis not present

## 2017-12-13 LAB — GLUCOSE, CAPILLARY
GLUCOSE-CAPILLARY: 104 mg/dL — AB (ref 65–99)
Glucose-Capillary: 111 mg/dL — ABNORMAL HIGH (ref 65–99)

## 2017-12-13 NOTE — Progress Notes (Signed)
Daily Session Note  Patient Details  Name: Billy Wise MRN: [id number] Date of Birth: Jan 11, 1946 Referring Provider:     Pulmonary Rehab from 11/30/2017 in [facility] Cardiac and Pulmonary Rehab  Referring Provider  Ramachandran      Encounter Date: 12/13/2017  Check In: Session Check In - 12/13/17 1112      Check-In   Location  [facility]-Cardiac & Pulmonary Rehab    Staff Present  Nada Maclachlan, BA, ACSM CEP, Exercise Physiologist;Melvena Vink Amedeo Plenty, BS, ACSM CEP, Exercise Physiologist;Joseph Flavia Shipper    Supervising physician immediately available to respond to emergencies  [facility] immediately available ER MD    Physician(s)  Reita Cliche and Mariea Clonts    Medication changes reported      No    Fall or balance concerns reported     No    Warm-up and Cool-down  Performed as group-led Higher education careers adviser Performed  Yes    VAD Patient?  No      Pain Assessment   Currently in Pain?  No/denies    Multiple Pain Sites  No          Social History   Tobacco Use  Smoking Status Former Smoker  . Packs/day: 1.50  . Years: 50.00  . Pack years: 75.00  . Types: Cigarettes  . Last attempt to quit: 09/29/2008  . Years since quitting: 9.2  Smokeless Tobacco Never Used    Goals Met:  Proper associated with RPD/PD & O2 Sat Independence with exercise equipment Exercise tolerated well No report of cardiac concerns or symptoms Strength training completed today  Goals Unmet:  Not Applicable  Comments: Pt able to follow exercise prescription today without complaint.  Will continue to monitor for progression.    Dr. Emily Filbert is Medical Director for [place] and [facility].

## 2017-12-15 DIAGNOSIS — J849 Interstitial pulmonary disease, unspecified: Secondary | ICD-10-CM | POA: Diagnosis not present

## 2017-12-15 DIAGNOSIS — J449 Chronic obstructive pulmonary disease, unspecified: Secondary | ICD-10-CM

## 2017-12-15 LAB — GLUCOSE, CAPILLARY
GLUCOSE-CAPILLARY: 109 mg/dL — AB (ref 65–99)
Glucose-Capillary: 116 mg/dL — ABNORMAL HIGH (ref 65–99)

## 2017-12-15 NOTE — Progress Notes (Signed)
Daily Session Note  Patient Details  Name: Billy Wise MRN: [id number] Date of Birth: [birth year] Referring Provider:     Pulmonary Rehab from 11/30/2017 in [facility] Cardiac and Pulmonary Rehab  Referring Provider  Ramachandran      Encounter Date: 12/15/2017  Check In: Session Check In - 12/15/17 1026      Check-In   Location  [facility]-Cardiac & Pulmonary Rehab    Staff Present  Justin Mend Lorre Nick, MA, RCEP, CCRP, Exercise Physiologist;Amanda Oletta Darter, [place], ACSM CEP, Exercise Physiologist    Supervising physician immediately available to respond to emergencies  [facility] immediately available ER MD    Physician(s)  Reita Cliche and Jimmye Norman    Medication changes reported      No    Fall or balance concerns reported     No    Warm-up and Cool-down  Performed as group-led Higher education careers adviser Performed  Yes    VAD Patient?  No      Pain Assessment   Currently in Pain?  No/denies          Social History   Tobacco Use  Smoking Status Former Smoker  . Packs/day: 1.50  . Years: 50.00  . Pack years: 75.00  . Types: Cigarettes  . Last attempt to quit: 09/29/2008  . Years since quitting: 9.2  Smokeless Tobacco Never Used    Goals Met:  Independence with exercise equipment Exercise tolerated well Personal goals reviewed No report of cardiac concerns or symptoms Strength training completed today  Goals Unmet:  Not Applicable  Comments: Pt able to follow exercise prescription today without complaint.  Will continue to monitor for progression.   Dr. Emily Filbert is Medical Director for [place] and [facility].

## 2017-12-17 ENCOUNTER — Encounter: Payer: Medicare Other | Attending: Internal Medicine | Admitting: *Deleted

## 2017-12-17 DIAGNOSIS — J849 Interstitial pulmonary disease, unspecified: Secondary | ICD-10-CM | POA: Diagnosis present

## 2017-12-17 DIAGNOSIS — J449 Chronic obstructive pulmonary disease, unspecified: Secondary | ICD-10-CM | POA: Insufficient documentation

## 2017-12-17 NOTE — Progress Notes (Signed)
Daily Session Note  Patient Details  Name: Billy Wise MRN: [id number] Date of Birth: 12/22/1945 Referring Provider:     Pulmonary Rehab from 11/30/2017 in [facility] Cardiac and Pulmonary Rehab  Referring Provider  Ramachandran      Encounter Date: 12/17/2017  Check In: Session Check In - 12/17/17 1051      Check-In   Location  [facility]-Cardiac & Pulmonary Rehab    Staff Present  Renita Papa, RN Vickki Hearing, BA, ACSM CEP, Exercise Physiologist;Krista Frederico Hamman, RN BSN    Supervising physician immediately available to respond to emergencies  [facility] immediately available ER MD    Physician(s)  Dr. Kerman Passey and Clearnce Hasten    Medication changes reported      No    Fall or balance concerns reported     No    Warm-up and Cool-down  Performed as group-led instruction    Resistance Training Performed  Yes    VAD Patient?  No      Pain Assessment   Currently in Pain?  No/denies          Social History   Tobacco Use  Smoking Status Former Smoker  . Packs/day: 1.50  . Years: 50.00  . Pack years: 75.00  . Types: Cigarettes  . Last attempt to quit: 09/29/2008  . Years since quitting: 9.2  Smokeless Tobacco Never Used    Goals Met:  Proper associated with RPD/PD & O2 Sat Independence with exercise equipment Using PLB without cueing & demonstrates good technique Exercise tolerated well Strength training completed today  Goals Unmet:  Not Applicable  Comments: Pt able to follow exercise prescription today without complaint.  Will continue to monitor for progression.    Dr. Emily Filbert is Medical Director for [place] and [facility].

## 2017-12-21 ENCOUNTER — Telehealth: Payer: Self-pay | Admitting: Internal Medicine

## 2017-12-21 ENCOUNTER — Other Ambulatory Visit: Payer: Self-pay | Admitting: *Deleted

## 2017-12-21 DIAGNOSIS — J449 Chronic obstructive pulmonary disease, unspecified: Secondary | ICD-10-CM

## 2017-12-21 NOTE — Telephone Encounter (Signed)
Contacted patient and scheduled PFT and CT Chest on 02/01/18 at 7:30 [facility] F/U appointment scheduled for 02/07/18 at 11:45. Pt is aware of appointment date, times and location.  Appointment information mailed to patient. Nothing else needed at this time. Rhonda J Cobb

## 2017-12-21 NOTE — Telephone Encounter (Signed)
Pt would like to schedule PFT. Please call.

## 2017-12-22 DIAGNOSIS — J449 Chronic obstructive pulmonary disease, unspecified: Secondary | ICD-10-CM

## 2017-12-22 DIAGNOSIS — J849 Interstitial pulmonary disease, unspecified: Secondary | ICD-10-CM | POA: Diagnosis not present

## 2017-12-22 NOTE — Progress Notes (Signed)
Daily Session Note  Patient Details  Name: Billy Wise MRN: [id number] Date of Birth: 1946/07/27 Referring Provider:     Pulmonary Rehab from 11/30/2017 in [facility] Cardiac and Pulmonary Rehab  Referring Provider  Ramachandran      Encounter Date: 12/22/2017  Check In: Session Check In - 12/22/17 1018      Check-In   Location  [facility]-Cardiac & Pulmonary Rehab    Staff Present  Nada Maclachlan, BA, ACSM CEP, Exercise Physiologist;Jacquel Redditt Darrin Nipper, [place], RCEP, CCRP, Exercise Physiologist    Supervising physician immediately available to respond to emergencies  [facility] immediately available ER MD    Physician(s)  Dr. Joni Fears and Jimmye Norman    Medication changes reported      No    Fall or balance concerns reported     No    Warm-up and Cool-down  Performed as group-led instruction    Resistance Training Performed  Yes    VAD Patient?  No      Pain Assessment   Currently in Pain?  No/denies          Social History   Tobacco Use  Smoking Status Former Smoker  . Packs/day: 1.50  . Years: 50.00  . Pack years: 75.00  . Types: Cigarettes  . Last attempt to quit: 09/29/2008  . Years since quitting: 9.2  Smokeless Tobacco Never Used    Goals Met:  Independence with exercise equipment Exercise tolerated well No report of cardiac concerns or symptoms Strength training completed today  Goals Unmet:  Not Applicable  Comments: Pt able to follow exercise prescription today without complaint.  Will continue to monitor for progression.   Dr. Emily Filbert is Medical Director for [place] and [facility].

## 2017-12-24 ENCOUNTER — Encounter: Payer: Medicare Other | Admitting: *Deleted

## 2017-12-24 DIAGNOSIS — G4733 Obstructive sleep apnea (adult) (pediatric): Secondary | ICD-10-CM | POA: Diagnosis not present

## 2017-12-24 DIAGNOSIS — J849 Interstitial pulmonary disease, unspecified: Secondary | ICD-10-CM | POA: Diagnosis not present

## 2017-12-24 DIAGNOSIS — J449 Chronic obstructive pulmonary disease, unspecified: Secondary | ICD-10-CM

## 2017-12-24 NOTE — Progress Notes (Signed)
Daily Session Note  Patient Details  Name: Billy Wise MRN: [id number] Date of Birth: [birth year] Referring Provider:     Pulmonary Rehab from 11/30/2017 in [facility] Cardiac and Pulmonary Rehab  Referring Provider  Ramachandran      Encounter Date: 12/24/2017  Check In: Session Check In - 12/24/17 1054      Check-In   Location  [facility]-Cardiac & Pulmonary Rehab    Staff Present  Nyoka Cowden, RN, BSN, MA;Meredith Sherryll Burger, RN Vickki Hearing, BA, ACSM CEP, Exercise Physiologist    Supervising physician immediately available to respond to emergencies  [facility] immediately available ER MD    Physician(s)   Dr. Reita Cliche and Archie Balboa    Medication changes reported      No    Fall or balance concerns reported     No    Warm-up and Cool-down  Performed as group-led instruction    Resistance Training Performed  Yes    VAD Patient?  No      Pain Assessment   Currently in Pain?  No/denies          Social History   Tobacco Use  Smoking Status Former Smoker  . Packs/day: 1.50  . Years: 50.00  . Pack years: 75.00  . Types: Cigarettes  . Last attempt to quit: 09/29/2008  . Years since quitting: 9.2  Smokeless Tobacco Never Used    Goals Met:  Proper associated with RPD/PD & O2 Sat Independence with exercise equipment Using PLB without cueing & demonstrates good technique Exercise tolerated well Strength training completed today  Goals Unmet:  Not Applicable  Comments: Pt able to follow exercise prescription today without complaint.  Will continue to monitor for progression. Home exercise done with patient. Reviewed Target Heart Rate, RPE scale, and safety   Dr. Emily Filbert is Medical Director for [place] and [facility].

## 2017-12-27 DIAGNOSIS — J849 Interstitial pulmonary disease, unspecified: Secondary | ICD-10-CM | POA: Diagnosis not present

## 2017-12-27 DIAGNOSIS — J449 Chronic obstructive pulmonary disease, unspecified: Secondary | ICD-10-CM

## 2017-12-27 NOTE — Progress Notes (Signed)
Pulmonary Individual Treatment Plan  Patient Details  Name: Billy Wise MRN: [id number] Date of Birth: 06-Aug-1946 Referring Provider:     Pulmonary Rehab from 11/30/2017 in [facility] Pulmonary Re[facility]  Referring Provider  Ramachandran      Initial Encounter Date:    Pulmonary Rehab from 11/30/2017 in [facility] Cardiac and Pulmonary Rehab  Date  11/30/17  Referring Provider  Ashby Dawes      Visit Diagnosis: COPD with chronic bronchitis and emphysema ([place])  Patient's Home Medications on Admission:  Current Outpatient Medications:  .  acidophilus (RISAQUAD) CAPS capsule, Take 1 capsule by mouth daily., Disp: , Rfl:  .  amLODipine (NORVASC) 10 MG tablet, Take 10 mg by mouth daily., Disp: , Rfl: 0 .  aspirin EC 81 MG tablet, Take 81 mg by mouth daily., Disp: , Rfl:  .  BYETTA 10 MCG PEN 10 MCG/0.04ML SOPN injection, Inject 10 mcg into the skin 2 (two) times daily with a meal. , Disp: , Rfl: 0 .  finasteride (PROSCAR) 5 MG tablet, Take 5 mg by mouth daily., Disp: , Rfl: 0 .  glimepiride (AMARYL) 4 MG tablet, Take 4 mg by mouth daily with breakfast. , Disp: , Rfl: 0 .  guaiFENesin-codeine 100-10 MG/5ML syrup, Take 15 mLs by mouth every 4 (four) hours as needed for cough., Disp: 120 mL, Rfl: 0 .  ipratropium-albuterol (DUONEB) 0.5-2.5 (3) MG/3ML SOLN, Take 3 mLs by nebulization every 6 (six) hours., Disp: 360 mL, Rfl: 2 .  LEVEMIR FLEXTOUCH 100 UNIT/ML Pen, Inject 77 Units into the skin at bedtime. , Disp: , Rfl: 0 .  metFORMIN (GLUCOPHAGE) 1000 MG tablet, Take 1 tablet by mouth 2 (two) times daily., Disp: , Rfl: 0 .  naproxen (NAPROSYN) 500 MG tablet, Take 500 mg by mouth 2 (two) times daily with a meal., Disp: , Rfl:  .  omeprazole (PRILOSEC) 20 MG capsule, Take 20 mg by mouth daily., Disp: , Rfl: 0 .  oxybutynin (DITROPAN-XL) 10 MG 24 hr tablet, Take 10 mg by mouth daily., Disp: , Rfl: 0 .  polycarbophil (FIBERCON) 625 MG tablet, Take 625 mg by mouth every evening., Disp: , Rfl:   .  PROAIR HFA 108 (90 Base) MCG/ACT inhaler, Take 2 puffs by mouth every 4 (four) hours as needed for wheezing. , Disp: , Rfl: 0 .  quinapril (ACCUPRIL) 40 MG tablet, Take 40 mg by mouth daily., Disp: , Rfl: 0 .  simvastatin (ZOCOR) 20 MG tablet, Take 20 mg by mouth every evening., Disp: , Rfl: 0 .  SPIRIVA HANDIHALER 18 MCG inhalation capsule, Take 18 mcg by mouth daily. , Disp: , Rfl: 0 .  SYMBICORT 80-4.5 MCG/ACT inhaler, Take 2 puffs by mouth 2 (two) times daily., Disp: , Rfl: 0 .  tamsulosin (FLOMAX) 0.4 MG CAPS capsule, Take 1 capsule by mouth at bedtime. , Disp: , Rfl: 0 .  traZODone (DESYREL) 50 MG tablet, Take 50 mg by mouth at bedtime., Disp: , Rfl: 0  Past Medical History: Past Medical History:  Diagnosis Date  . Actinic keratoses   . BPH (benign prostatic hyperplasia)   . CKD stage 1 due to type 2 diabetes mellitus ([place])   . COPD (chronic obstructive pulmonary disease) ([place])   . Diabetes mellitus without complication ([place])   . GERD (gastroesophageal reflux disease)   . Hypertension   . Insomnia   . Iron deficiency anemia   . Mixed hyperlipidemia   . OSA (obstructive sleep apnea)     Tobacco  Use: Social History   Tobacco Use  Smoking Status Former Smoker  . Packs/day: 1.50  . Years: 50.00  . Pack years: 75.00  . Types: Cigarettes  . Last attempt to quit: 09/29/2008  . Years since quitting: 9.2  Smokeless Tobacco Never Used    Labs: Recent Review Scientist, physiological    Labs for ITP Cardiac and Pulmonary Rehab Latest Ref Rng & Units 10/29/2017 10/29/2017   Hemoglobin A1c 4.8 - 5.6 % 7.2(H) 7.2(H)       Pulmonary Assessment Scores: Pulmonary Assessment Scores    Row Name 11/30/17 1147         ADL UCSD   ADL Phase  Entry     SOB Score total  79     Rest  1     Walk  1     Stairs  5     Bath  2     Dress  3     Shop  4       CAT Score   CAT Score  26       mMRC Score   mMRC Score  1        Pulmonary Function Assessment: Pulmonary Function  Assessment - 11/30/17 1038      Pulmonary Function Tests   FVC%  88 % Date performed 11/30/17 [facility]    FEV1%  48 %    FEV1/FVC Ratio  43      Breath   Bilateral Breath Sounds  Clear    Shortness of Breath  Yes;Fear of Shortness of Breath;Limiting activity;Panic with Shortness of Breath       Exercise Target Goals:    Exercise Program Goal: Individual exercise prescription set using results from initial 6 min walk test and THRR while considering  patient's activity barriers and safety.    Exercise Prescription Goal: Initial exercise prescription builds to 30-45 minutes a day of aerobic activity, 2-3 days per week.  Home exercise guidelines will be given to patient during program as part of exercise prescription that the participant will acknowledge.  Activity Barriers & Risk Stratification:   6 Minute Walk: 6 Minute Walk    Row Name 11/30/17 1141         6 Minute Walk   Phase  Initial     Distance  1350 feet     Walk Time  6 minutes     # of Rest Breaks  0     MPH  2.55     METS  3.09     RPE  11     Perceived Dyspnea   2     VO2 Peak  10.84     Symptoms  No     Resting HR  96 bpm     Resting BP  100/56     Resting Oxygen Saturation   90 %     Exercise Oxygen Saturation  during 6 min walk  85 %     Max Ex. HR  126 bpm     Max Ex. BP  126/54     2 Minute Post BP  110/58       Interval HR   1 Minute HR  113     2 Minute HR  115     3 Minute HR  117     4 Minute HR  117     5 Minute HR  117     6 Minute HR  126  2 Minute Post HR  97     Interval Heart Rate?  Yes       Interval Oxygen   Interval Oxygen?  Yes     Baseline Oxygen Saturation %  90 %     1 Minute Oxygen Saturation %  87 %     1 Minute Liters of Oxygen  0 L     2 Minute Oxygen Saturation %  85 %     2 Minute Liters of Oxygen  0 L     3 Minute Oxygen Saturation %  85 %     3 Minute Liters of Oxygen  0 L     4 Minute Oxygen Saturation %  86 %     4 Minute Liters of Oxygen  0 L     5 Minute  Oxygen Saturation %  87 %     5 Minute Liters of Oxygen  0 L     6 Minute Oxygen Saturation %  86 %     6 Minute Liters of Oxygen  0 L     2 Minute Post Oxygen Saturation %  93 %     2 Minute Post Liters of Oxygen  0 L       Oxygen Initial Assessment: Oxygen Initial Assessment - 11/30/17 1046      Home Oxygen   Home Oxygen Device  Home Concentrator;E-Tanks    Sleep Oxygen Prescription  Continuous    Liters per minute  2    Home Exercise Oxygen Prescription  None    Home at Rest Exercise Oxygen Prescription  None    Compliance with Home Oxygen Use  Yes      Initial 6 min Walk   Oxygen Used  None      Program Oxygen Prescription   Program Oxygen Prescription  None      Intervention   Short Term Goals  To learn and exhibit compliance with exercise, home and travel O2 prescription;To learn and understand importance of maintaining oxygen saturations>88%;To learn and demonstrate proper use of respiratory medications;To learn and understand importance of monitoring SPO2 with pulse oximeter and demonstrate accurate use of the pulse oximeter.;To learn and demonstrate proper pursed lip breathing techniques or other breathing techniques.    Long  Term Goals  Exhibits compliance with exercise, home and travel O2 prescription;Verbalizes importance of monitoring SPO2 with pulse oximeter and return demonstration;Maintenance of O2 saturations>88%;Exhibits proper breathing techniques, such as pursed lip breathing or other method taught during program session;Compliance with respiratory medication;Demonstrates proper use of MDI's       Oxygen Re-Evaluation: Oxygen Re-Evaluation    Row Name 12/10/17 1039             Program Oxygen Prescription   Program Oxygen Prescription  None         Home Oxygen   Home Oxygen Device  Home Concentrator;E-Tanks       Sleep Oxygen Prescription  Continuous       Liters per minute  2       Home Exercise Oxygen Prescription  None       Home at Rest  Exercise Oxygen Prescription  None       Compliance with Home Oxygen Use  Yes         Goals/Expected Outcomes   Short Term Goals  To learn and exhibit compliance with exercise, home and travel O2 prescription;To learn and understand importance of maintaining oxygen saturations>88%;To learn and demonstrate proper use of  respiratory medications;To learn and understand importance of monitoring SPO2 with pulse oximeter and demonstrate accurate use of the pulse oximeter.;To learn and demonstrate proper pursed lip breathing techniques or other breathing techniques.       Long  Term Goals  Exhibits compliance with exercise, home and travel O2 prescription;Verbalizes importance of monitoring SPO2 with pulse oximeter and return demonstration;Maintenance of O2 saturations>88%;Exhibits proper breathing techniques, such as pursed lip breathing or other method taught during program session;Compliance with respiratory medication;Demonstrates proper use of MDI's       Comments  Reviewed PLB technique with pt.  Talked about how it work and it's important to maintaining his exercise saturations.         Goals/Expected Outcomes  Short: Become more profiecient at using PLB.   Long: Become independent at using PLB.          Oxygen Discharge (Final Oxygen Re-Evaluation): Oxygen Re-Evaluation - 12/10/17 1039      Program Oxygen Prescription   Program Oxygen Prescription  None      Home Oxygen   Home Oxygen Device  Home Concentrator;E-Tanks    Sleep Oxygen Prescription  Continuous    Liters per minute  2    Home Exercise Oxygen Prescription  None    Home at Rest Exercise Oxygen Prescription  None    Compliance with Home Oxygen Use  Yes      Goals/Expected Outcomes   Short Term Goals  To learn and exhibit compliance with exercise, home and travel O2 prescription;To learn and understand importance of maintaining oxygen saturations>88%;To learn and demonstrate proper use of respiratory medications;To learn and  understand importance of monitoring SPO2 with pulse oximeter and demonstrate accurate use of the pulse oximeter.;To learn and demonstrate proper pursed lip breathing techniques or other breathing techniques.    Long  Term Goals  Exhibits compliance with exercise, home and travel O2 prescription;Verbalizes importance of monitoring SPO2 with pulse oximeter and return demonstration;Maintenance of O2 saturations>88%;Exhibits proper breathing techniques, such as pursed lip breathing or other method taught during program session;Compliance with respiratory medication;Demonstrates proper use of MDI's    Comments  Reviewed PLB technique with pt.  Talked about how it work and it's important to maintaining his exercise saturations.      Goals/Expected Outcomes  Short: Become more profiecient at using PLB.   Long: Become independent at using PLB.       Initial Exercise Prescription: Initial Exercise Prescription - 11/30/17 1100      Date of Initial Exercise RX and Referring Provider   Date  11/30/17    Referring Provider  Ramachandran      Treadmill   MPH  2.5    Grade  0.5    Minutes  15    METs  3      Recumbant Bike   Level  3    RPM  60    Watts  28    Minutes  15    METs  3      Arm Ergometer   Level  2    Watts  44    RPM  40    Minutes  15    METs  3      Recumbant Elliptical   Level  2    RPM  60    Minutes  15    METs  3      REL-XR   Level  2    Speed  50    Minutes  15  METs  3      Prescription Details   Frequency (times per week)  3    Duration  Progress to 45 minutes of aerobic exercise without signs/symptoms of physical distress      Intensity   THRR 40-80% of Max Heartrate  118-140    Ratings of Perceived Exertion  11-15    Perceived Dyspnea  0-4      Resistance Training   Training Prescription  Yes    Weight  3 lb    Reps  10-15       Perform Capillary Blood Glucose checks as needed.  Exercise Prescription Changes: Exercise Prescription  Changes    Row Name 12/15/17 1200 12/24/17 1000           Response to Exercise   Blood Pressure (Admit)  132/64  -      Blood Pressure (Exercise)  134/66  -      Blood Pressure (Exit)  116/58  -      Heart Rate (Admit)  111 bpm  -      Heart Rate (Exercise)  123 bpm  -      Heart Rate (Exit)  100 bpm  -      Oxygen Saturation (Admit)  90 %  -      Oxygen Saturation (Exercise)  90 %  -      Oxygen Saturation (Exit)  89 %  -      Rating of Perceived Exertion (Exercise)  12  -      Perceived Dyspnea (Exercise)  1  -      Symptoms  none  -      Duration  Continue with 45 min of aerobic exercise without signs/symptoms of physical distress.  -      Intensity  THRR unchanged  -        Progression   Progression  Continue to progress workloads to maintain intensity without signs/symptoms of physical distress.  Continue to progress workloads to maintain intensity without signs/symptoms of physical distress.      Average METs  2.9  2.9        Resistance Training   Training Prescription  Yes  Yes      Weight  3 lb  3 lb      Reps  10-15  10-15        Interval Training   Interval Training  No  No        Treadmill   MPH  2.5  2.5      Grade  2  2      Minutes  15  15      METs  3.6  3.6        T5 Nustep   Level  2  2      SPM  80  80      Minutes  15  15      METs  2.2  2.2        Home Exercise Plan   Plans to continue exercise at  -  [employer]      Frequency  -  Add 1 additional day to program exercise sessions. walking at home      Initial Home Exercises Provided  -  12/24/17         Exercise Comments: Exercise Comments    Row Name 12/10/17 1038 12/24/17 1109         Exercise Comments   First full day  of exercise!  Patient was oriented to gym and equipment including functions, settings, policies, and procedures.  Patient's individual exercise prescription and treatment plan were reviewed.  All starting workloads were established based on the results of the 6 minute  walk test done at initial orientation visit.  The plan for exercise progression was also introduced and progression will be customized based on patient's performance and goals  Home exercise reviewed with patient. Handout given and signed. Reviewed target heart rate, RPE, dyspnea scale, and safety. Patient verbalized understanding. He will add an extra day of exercise at home. He is planning on attending [employer]         Exercise Goals and Review: Exercise Goals    Row Name 11/30/17 1158             Exercise Goals   Increase Physical Activity  Yes       Intervention  Provide advice, education, support and counseling about physical activity/exercise needs.;Develop an individualized exercise prescription for aerobic and resistive training based on initial evaluation findings, risk stratification, comorbidities and participant's personal goals.       Expected Outcomes  Short Term: Attend rehab on a regular basis to increase amount of physical activity.;Long Term: Add in home exercise to make exercise part of routine and to increase amount of physical activity.;Long Term: Exercising regularly at least 3-5 days a week.       Increase Strength and Stamina  Yes       Intervention  Provide advice, education, support and counseling about physical activity/exercise needs.;Develop an individualized exercise prescription for aerobic and resistive training based on initial evaluation findings, risk stratification, comorbidities and participant's personal goals.       Expected Outcomes  Short Term: Increase workloads from initial exercise prescription for resistance, speed, and METs.;Short Term: Perform resistance training exercises routinely during rehab and add in resistance training at home;Long Term: Improve cardiorespiratory fitness, muscular endurance and strength as measured by increased METs and functional capacity (6MWT)       Able to understand and use rate of perceived exertion (RPE) scale  Yes        Intervention  Provide education and explanation on how to use RPE scale       Expected Outcomes  Short Term: Able to use RPE daily in rehab to express subjective intensity level;Long Term:  Able to use RPE to guide intensity level when exercising independently       Able to understand and use Dyspnea scale  Yes       Intervention  Provide education and explanation on how to use Dyspnea scale       Expected Outcomes  Short Term: Able to use Dyspnea scale daily in rehab to express subjective sense of shortness of breath during exertion;Long Term: Able to use Dyspnea scale to guide intensity level when exercising independently       Knowledge and understanding of Target Heart Rate Range (THRR)  Yes       Intervention  Provide education and explanation of THRR including how the numbers were predicted and where they are located for reference       Expected Outcomes  Short Term: Able to state/look up THRR;Long Term: Able to use THRR to govern intensity when exercising independently;Short Term: Able to use daily as guideline for intensity in rehab       Able to check pulse independently  Yes       Intervention  Provide education and demonstration on  how to check pulse in carotid and radial arteries.;Review the importance of being able to check your own pulse for safety during independent exercise       Expected Outcomes  Short Term: Able to explain why pulse checking is important during independent exercise;Long Term: Able to check pulse independently and accurately       Understanding of Exercise Prescription  Yes       Intervention  Provide education, explanation, and written materials on patient's individual exercise prescription       Expected Outcomes  Short Term: Able to explain program exercise prescription;Long Term: Able to explain home exercise prescription to exercise independently          Exercise Goals Re-Evaluation : Exercise Goals Re-Evaluation    Row Name 12/10/17 1038 12/15/17 1241  12/24/17 1056         Exercise Goal Re-Evaluation   Exercise Goals Review  Understanding of Exercise Prescription;Able to understand and use Dyspnea scale;Knowledge and understanding of Target Heart Rate Range (THRR);Able to understand and use rate of perceived exertion (RPE) scale  Increase Physical Activity;Increase Strength and Stamina;Able to understand and use Dyspnea scale;Able to understand and use rate of perceived exertion (RPE) scale  Increase Physical Activity;Increase Strength and Stamina;Able to understand and use Dyspnea scale;Able to understand and use rate of perceived exertion (RPE) scale;Understanding of Exercise Prescription;Knowledge and understanding of Target Heart Rate Range (THRR);Able to check pulse independently     Comments  Reviewed RPE scale, THR and program prescription with pt today.  Pt voiced understanding and was given a copy of goals to take home.   Demetric has tolerated exercise well so far.  He is working at a higher incline on [employer] exercise done with patient. Handout given and signed.     Expected Outcomes  Short: Use RPE daily to regulate intensity.  Long: Follow program prescription in THR.  Short - [place] will continue to attend regularly Long - Hanif will improve overall MET level  Short: add one extra day of exercise outside of the program. Long: become indpendent with exercise after graduation        Discharge Exercise Prescription (Final Exercise Prescription Changes): Exercise Prescription Changes - 12/24/17 1000      Progression   Progression  Continue to progress workloads to maintain intensity without signs/symptoms of physical distress.    Average METs  2.9      Resistance Training   Training Prescription  Yes    Weight  3 lb    Reps  10-15      Interval Training   Interval Training  No      Treadmill   MPH  2.5    Grade  2    Minutes  15    METs  3.6      T5 Nustep   Level  2    SPM  80    Minutes  15    METs  2.2      Home  Exercise Plan   Plans to continue exercise at  [employer]    Frequency  Add 1 additional day to program exercise sessions. walking at home    Initial Home Exercises Provided  12/24/17       Nutrition:  Target Goals: Understanding of nutrition guidelines, daily intake of sodium '1500mg'$ , cholesterol '200mg'$ , calories 30% from fat and 7% or less from saturated fats, daily to have 5 or more servings of fruits and vegetables.  Biometrics: Pre Biometrics -  11/30/17 1144      Pre Biometrics   Height  5' 5.25" (1.657 m)    Weight  179 lb 6.4 oz (81.4 kg)    Waist Circumference  41.5 inches    Hip Circumference  42 inches    Waist to Hip Ratio  0.99 %    BMI (Calculated)  29.64        Nutrition Therapy Plan and Nutrition Goals: Nutrition Therapy & Goals - 11/30/17 1037      Personal Nutrition Goals   Comments  eat healthier and lose some wieght.      Intervention Plan   Intervention  Prescribe, educate and counsel regarding individualized specific dietary modifications aiming towards targeted core components such as weight, hypertension, lipid management, diabetes, heart failure and other comorbidities.;Nutrition handout(s) given to patient.    Expected Outcomes  Short Term Goal: Understand basic principles of dietary content, such as calories, fat, sodium, cholesterol and nutrients.;Short Term Goal: A plan has been developed with personal nutrition goals set during dietitian appointment.;Long Term Goal: Adherence to prescribed nutrition plan.       Nutrition Assessments: Nutrition Assessments - 11/30/17 1035      MEDFICTS Scores   Pre Score  108       Nutrition Goals Re-Evaluation: Nutrition Goals Re-Evaluation    Row Name 12/15/17 1028             Goals   Current Weight  176 lb (79.8 kg)       Nutrition Goal  Lose some weight. Eat as healthy as he can.       Comment  He would like to lose some more weight to help him breath. He does not want to meet with the dietician  since he has seen a dietician for his diabetes.       Expected Outcome  Short: lose 5 more pounds. Long: maintain weight Loss.          Nutrition Goals Discharge (Final Nutrition Goals Re-Evaluation): Nutrition Goals Re-Evaluation - 12/15/17 1028      Goals   Current Weight  176 lb (79.8 kg)    Nutrition Goal  Lose some weight. Eat as healthy as he can.    Comment  He would like to lose some more weight to help him breath. He does not want to meet with the dietician since he has seen a dietician for his diabetes.    Expected Outcome  Short: lose 5 more pounds. Long: maintain weight Loss.       Psychosocial: Target Goals: Acknowledge presence or absence of significant depression and/or stress, maximize coping skills, provide positive support system. Participant is able to verbalize types and ability to use techniques and skills needed for reducing stress and depression.   Initial Review & Psychosocial Screening: Initial Psych Review & Screening - 11/30/17 1032      Initial Review   Current issues with  Current Sleep Concerns;Current Stress Concerns    Source of Stress Concerns  Chronic Illness;Unable to perform yard/household activities;Unable to participate in former interests or hobbies    Comments  His yard work got to be too much for him, he cant swim like he wants to. He lost 10 pounds recently and has not exercised in many years.      Family Dynamics   Good Support System?  Yes    Comments  He looks to his wife and his daughter inlaw for support      Barriers   Psychosocial  barriers to participate in program  The patient should benefit from training in stress management and relaxation.      Screening Interventions   Interventions  Program counselor consult;Encouraged to exercise;Provide feedback about the scores to participant;To provide support and resources with identified psychosocial needs    Expected Outcomes  Short Term goal: Utilizing psychosocial counselor, staff and  physician to assist with identification of specific Stressors or current issues interfering with healing process. Setting desired goal for each stressor or current issue identified.;Long Term goal: The participant improves quality of Life and PHQ9 Scores as seen by post scores and/or verbalization of changes;Short Term goal: Identification and review with participant of any Quality of Life or Depression concerns found by scoring the questionnaire.;Long Term Goal: Stressors or current issues are controlled or eliminated.       Quality of Life Scores:  Scores of 19 and below usually indicate a poorer quality of life in these areas.  A difference of  2-3 points is a clinically meaningful difference.  A difference of 2-3 points in the total score of the Quality of Life Index has been associated with significant improvement in overall quality of life, self-image, physical symptoms, and general health in studies assessing change in quality of life.  PHQ-9: Recent Review Flowsheet Data    Depression screen [employer] [facility] 2/9 12/13/2017 11/30/2017   Decreased Interest 3 3   Down, Depressed, Hopeless 0 0   PHQ - 2 Score 3 3   Altered sleeping 1 3   Tired, decreased energy 3 3   Change in appetite 0 0   Feeling bad or failure about yourself  0 0   Trouble concentrating 0 0   Moving slowly or fidgety/restless 0 0   Suicidal thoughts 0 0   PHQ-9 Score 7 9   Difficult doing work/chores Not difficult at all Not difficult at all     Interpretation of Total Score  Total Score Depression Severity:  1-4 = Minimal depression, 5-9 = Mild depression, 10-14 = Moderate depression, 15-19 = Moderately severe depression, 20-27 = Severe depression   Psychosocial Evaluation and Intervention: Psychosocial Evaluation - 12/13/17 1115      Psychosocial Evaluation & Interventions   Interventions  Stress management education;Relaxation education;Encouraged to exercise with the program and follow exercise prescription    Comments   Counselor met with Mr. Pannone today for initial psychosocial evaluation.  He is a 72 year old who has COPD and was hospitalized recently for flu and pneumonia.  He has a strong support system with a spouse; several sons and their families; and active involvement in his local church.  Travian reports his sleep has improved just recently with a change of medication back to Ambien since the Trazodone was not helpful.  He has a good appetite and denies a history of depression or anxiety - or any current symptoms.  Harl states he is typically in a positive mood and has minimal stress in his life - although his spouse is scheduled for surgery this Friday and he is concerned about that.  He has goals for this program to help him breathe better; to reduce the use of Oxygen; to improve his quality of breathing and to exercise consistently.  Staff will follow with Jayshawn throughout the course of this program.      Expected Outcomes  Tawfiq will benefit from consistent exercise to achieve his stated goals.  The educational and psychoeducational components of this program will help him understand; manage and cope  better with life in general.  Staff will follow.     Continue Psychosocial Services   Follow up required by staff       Psychosocial Re-Evaluation: Psychosocial Re-Evaluation    MacArthur Name 12/15/17 1031             Psychosocial Re-Evaluation   Current issues with  Current Sleep Concerns;Current Stress Concerns       Comments  Jacquez has been sleeping better. His oxygen and a wedge pillow has helped him sleep. He has a sleep number bed coming this week. Exercising in rehab has helped him sleep better.       Expected Outcomes  Short: use his pillow and new bed to sleep better. Long: maintain a good sleep pattern and be compliant with equipment.       Interventions  Encouraged to attend Pulmonary Rehabilitation for the exercise       Continue Psychosocial Services   Follow up required by staff           Psychosocial Discharge (Final Psychosocial Re-Evaluation): Psychosocial Re-Evaluation - 12/15/17 1031      Psychosocial Re-Evaluation   Current issues with  Current Sleep Concerns;Current Stress Concerns    Comments  Alegandro has been sleeping better. His oxygen and a wedge pillow has helped him sleep. He has a sleep number bed coming this week. Exercising in rehab has helped him sleep better.    Expected Outcomes  Short: use his pillow and new bed to sleep better. Long: maintain a good sleep pattern and be compliant with equipment.    Interventions  Encouraged to attend Pulmonary Rehabilitation for the exercise    Continue Psychosocial Services   Follow up required by staff       Education: Education Goals: Education classes will be provided on a weekly basis, covering required topics. Participant will state understanding/return demonstration of topics presented.  Learning Barriers/Preferences: Learning Barriers/Preferences - 11/30/17 1039      Learning Barriers/Preferences   Learning Barriers  Sight wears glasses    Learning Preferences  None       Education Topics:  Initial Evaluation Education: - Verbal, written and demonstration of respiratory meds, oximetry and breathing techniques. Instruction on use of nebulizers and MDIs and importance of monitoring MDI activations.   Pulmonary Rehab from 12/22/2017 in [facility] Cardiac and Pulmonary Rehab  Date  11/30/17  Educator  [facility]  Instruction Review Code  1- Verbalizes Understanding      General Nutrition Guidelines/Fats and Fiber: -Group instruction provided by verbal, written material, models and posters to present the general guidelines for heart healthy nutrition. Gives an explanation and review of dietary fats and fiber.   Controlling Sodium/Reading Food Labels: -Group verbal and written material supporting the discussion of sodium use in heart healthy nutrition. Review and explanation with models, verbal and written materials  for utilization of the food label.   Exercise Physiology & General Exercise Guidelines: - Group verbal and written instruction with models to review the exercise physiology of the cardiovascular system and associated critical values. Provides general exercise guidelines with specific guidelines to those with heart or lung disease.    Aerobic Exercise & Resistance Training: - Gives group verbal and written instruction on the various components of exercise. Focuses on aerobic and resistive training programs and the benefits of this training and how to safely progress through these programs.   Flexibility, Balance, Mind/Body Relaxation: Provides group verbal/written instruction on the benefits of flexibility and balance training, including mind/body  exercise modes such as yoga, pilates and tai chi.  Demonstration and skill practice provided.   Stress and Anxiety: - Provides group verbal and written instruction about the health risks of elevated stress and causes of high stress.  Discuss the correlation between heart/lung disease and anxiety and treatment options. Review healthy ways to manage with stress and anxiety.   Pulmonary Rehab from 12/22/2017 in [facility] Cardiac and Pulmonary Rehab  Date  12/22/17  Educator  [facility]  Instruction Review Code  1- Verbalizes Understanding      Depression: - Provides group verbal and written instruction on the correlation between heart/lung disease and depressed mood, treatment options, and the stigmas associated with seeking treatment.   Exercise & Equipment Safety: - Individual verbal instruction and demonstration of equipment use and safety with use of the equipment.   Pulmonary Rehab from 12/22/2017 in [facility] Cardiac and Pulmonary Rehab  Date  11/30/17  Educator  [facility]  Instruction Review Code  1- Verbalizes Understanding      Infection Prevention: - Provides verbal and written material to individual with discussion of infection control including proper hand  washing and proper equipment cleaning during exercise session.   Pulmonary Rehab from 12/22/2017 in [facility] Cardiac and Pulmonary Rehab  Date  11/30/17  Educator  [employer] [facility]  Instruction Review Code  1- Verbalizes Understanding      Falls Prevention: - Provides verbal and written material to individual with discussion of falls prevention and safety.   Pulmonary Rehab from 12/22/2017 in [facility] Cardiac and Pulmonary Rehab  Date  11/30/17  Educator  [facility]  Instruction Review Code  1- Verbalizes Understanding      Diabetes: - Individual verbal and written instruction to review signs/symptoms of diabetes, desired ranges of glucose level fasting, after meals and with exercise. Advice that pre and post exercise glucose checks will be done for 3 sessions at entry of program.   Chronic Lung Diseases: - Group verbal and written instruction to review updates, respiratory medications, advancements in procedures and treatments. Discuss use of supplemental oxygen including available portable oxygen systems, continuous and intermittent flow rates, concentrators, personal use and safety guidelines. Review proper use of inhaler and spacers. Provide informative websites for self-education.    Energy Conservation: - Provide group verbal and written instruction for methods to conserve energy, plan and organize activities. Instruct on pacing techniques, use of adaptive equipment and posture/positioning to relieve shortness of breath.   Pulmonary Rehab from 12/22/2017 in [facility] Cardiac and Pulmonary Rehab  Date  12/15/17  Educator  [facility]  Instruction Review Code  1- Verbalizes Understanding      Triggers and Exacerbations: - Group verbal and written instruction to review types of environmental triggers and ways to prevent exacerbations. Discuss weather changes, air quality and the benefits of nasal washing. Review warning signs and symptoms to help prevent infections. Discuss techniques for effective airway clearance, coughing,  and vibrations.   AED/CPR: - Group verbal and written instruction with the use of models to demonstrate the basic use of the AED with the basic ABC's of resuscitation.   Pulmonary Rehab from 12/22/2017 in [facility] Cardiac and Pulmonary Rehab  Date  12/17/17  Educator  [facility]  Instruction Review Code  1- Actuary and Physiology of the Lungs: - Group verbal and written instruction with the use of models to provide basic lung anatomy and physiology related to function, structure and complications of lung disease.   Anatomy & Physiology of the Heart: -  Group verbal and written instruction and models provide basic cardiac anatomy and physiology, with the coronary electrical and arterial systems. Review of Valvular disease and Heart Failure   Cardiac Medications: - Group verbal and written instruction to review commonly prescribed medications for heart disease. Reviews the medication, class of the drug, and side effects.   Know Your Numbers and Risk Factors: -Group verbal and written instruction about important numbers in your health.  Discussion of what are risk factors and how they play a role in the disease process.  Review of Cholesterol, Blood Pressure, Diabetes, and BMI and the role they play in your overall health.   Sleep Hygiene: -Provides group verbal and written instruction about how sleep can affect your health.  Define sleep hygiene, discuss sleep cycles and impact of sleep habits. Review good sleep hygiene tips.    Other: -Provides group and verbal instruction on various topics (see comments)    Knowledge Questionnaire Score: Knowledge Questionnaire Score - 11/30/17 1039      Knowledge Questionnaire Score   Pre Score  15/18 reviewed with patient        Core Components/Risk Factors/Patient Goals at Admission: Personal Goals and Risk Factors at Admission - 11/30/17 1047      Core Components/Risk Factors/Patient Goals on Admission    Weight  Management  Yes;Weight Loss    Intervention  Weight Management: Develop a combined nutrition and exercise program designed to reach desired caloric intake, while maintaining appropriate intake of nutrient and fiber, sodium and fats, and appropriate energy expenditure required for the weight goal.;Weight Management: Provide education and appropriate resources to help participant work on and attain dietary goals.;Weight Management/Obesity: Establish reasonable short term and long term weight goals.    Admit Weight  179 lb 6.4 oz (81.4 kg)    Goal Weight: Short Term  174 lb (78.9 kg)    Goal Weight: Long Term  170 lb (77.1 kg)    Expected Outcomes  Short Term: Continue to assess and modify interventions until short term weight is achieved;Long Term: Adherence to nutrition and physical activity/exercise program aimed toward attainment of established weight goal;Weight Maintenance: Understanding of the daily nutrition guidelines, which includes 25-35% calories from fat, 7% or less cal from saturated fats, less than '200mg'$  cholesterol, less than 1.5gm of sodium, & 5 or more servings of fruits and vegetables daily;Weight Loss: Understanding of general recommendations for a balanced deficit meal plan, which promotes 1-2 lb weight loss per week and includes a negative energy balance of [phone] kcal/d;Understanding recommendations for meals to include 15-35% energy as protein, 25-35% energy from fat, 35-60% energy from carbohydrates, less than '200mg'$  of dietary cholesterol, 20-35 gm of total fiber daily;Understanding of distribution of calorie intake throughout the day with the consumption of 4-5 meals/snacks    Improve shortness of breath with ADL's  Yes    Intervention  Provide education, individualized exercise plan and daily activity instruction to help decrease symptoms of SOB with activities of daily living.    Expected Outcomes  Short Term: Improve cardiorespiratory fitness to achieve a reduction of symptoms  when performing ADLs;Long Term: Be able to perform more ADLs without symptoms or delay the onset of symptoms    Diabetes  Yes    Intervention  Provide education about signs/symptoms and action to take for hypo/hyperglycemia.;Provide education about proper nutrition, including hydration, and aerobic/resistive exercise prescription along with prescribed medications to achieve blood glucose in normal ranges: Fasting glucose 65-99 mg/dL    Expected Outcomes  Short  Term: Participant verbalizes understanding of the signs/symptoms and immediate care of hyper/hypoglycemia, proper foot care and importance of medication, aerobic/resistive exercise and nutrition plan for blood glucose control.;Long Term: Attainment of HbA1C < 7%.    Hypertension  Yes    Intervention  Provide education on lifestyle modifcations including regular physical activity/exercise, weight management, moderate sodium restriction and increased consumption of fresh fruit, vegetables, and low fat dairy, alcohol moderation, and smoking cessation.;Monitor prescription use compliance.    Expected Outcomes  Long Term: Maintenance of blood pressure at goal levels.;Short Term: Continued assessment and intervention until BP is < 140/58m HG in hypertensive participants. < 130/855mHG in hypertensive participants with diabetes, heart failure or chronic kidney disease.    Lipids  Yes    Intervention  Provide education and support for participant on nutrition & aerobic/resistive exercise along with prescribed medications to achieve LDL '70mg'$ , HDL >'40mg'$ .    Expected Outcomes  Short Term: Participant states understanding of desired cholesterol values and is compliant with medications prescribed. Participant is following exercise prescription and nutrition guidelines.;Long Term: Cholesterol controlled with medications as prescribed, with individualized exercise RX and with personalized nutrition plan. Value goals: LDL < '70mg'$ , HDL > 40 mg.       Core  Components/Risk Factors/Patient Goals Review:    Core Components/Risk Factors/Patient Goals at Discharge (Final Review):    ITP Comments: ITP Comments    Row Name 11/30/17 1138 12/27/17 0829         ITP Comments  Medical Evaluation completed. Chart sent for review and changes to Dr. MaEmily Filbertirector of Lu[facility]vinDiagnosis can be found in [facility] encounter 11/15/17  30 day review completed. ITP sent to Dr. MaEmily Filbertirector of LuLake MadisonContinue with ITP unless changes are made by physician.         Comments: 30 day review

## 2017-12-27 NOTE — Progress Notes (Signed)
Daily Session Note  Patient Details  Name: Billy Wise MRN: [id number] Date of Birth: 08/18/1946 Referring Provider:     Pulmonary Rehab from 11/30/2017 in [facility] Cardiac and Pulmonary Rehab  Referring Provider  Ramachandran      Encounter Date: 12/27/2017  Check In: Session Check In - 12/27/17 1011      Check-In   Location  [facility]-Cardiac & Pulmonary Rehab    Staff Present  Nada Maclachlan, BA, ACSM CEP, Exercise Physiologist;Kelly Amedeo Plenty, BS, ACSM CEP, Exercise Physiologist;Rogen Porte Flavia Shipper    Supervising physician immediately available to respond to emergencies  [facility] immediately available ER MD    Physician(s)  Dr. Corky Downs and Jimmye Norman    Medication changes reported      No    Fall or balance concerns reported     No    Tobacco Cessation  No Change    Warm-up and Cool-down  Performed as group-led instruction    Resistance Training Performed  Yes    VAD Patient?  No      Pain Assessment   Currently in Pain?  No/denies          Social History   Tobacco Use  Smoking Status Former Smoker  . Packs/day: 1.50  . Years: 50.00  . Pack years: 75.00  . Types: Cigarettes  . Last attempt to quit: 09/29/2008  . Years since quitting: 9.2  Smokeless Tobacco Never Used    Goals Met:  Independence with exercise equipment Exercise tolerated well No report of cardiac concerns or symptoms Strength training completed today  Goals Unmet:  Not Applicable  Comments: Pt able to follow exercise prescription today without complaint.  Will continue to monitor for progression.   Dr. Emily Filbert is Medical Director for [place] and [facility].

## 2017-12-29 ENCOUNTER — Encounter: Payer: Medicare Other | Admitting: *Deleted

## 2017-12-29 DIAGNOSIS — J449 Chronic obstructive pulmonary disease, unspecified: Secondary | ICD-10-CM

## 2017-12-29 DIAGNOSIS — J849 Interstitial pulmonary disease, unspecified: Secondary | ICD-10-CM | POA: Diagnosis not present

## 2017-12-29 NOTE — Progress Notes (Signed)
Daily Session Note  Patient Details  Name: Billy Wise MRN: [id number] Date of Birth: 21-May-1946 Referring Provider:     Pulmonary Rehab from 11/30/2017 in [facility] Cardiac and Pulmonary Rehab  Referring Provider  Ramachandran      Encounter Date: 12/29/2017  Check In: Session Check In - 12/29/17 1004      Check-In   Location  [facility]-Cardiac & Pulmonary Rehab    Staff Present  Alberteen Sam, MA, RCEP, CCRP, Exercise Physiologist;Amanda Oletta Darter, BA, ACSM CEP, Exercise Physiologist;Joseph Flavia Shipper    Supervising physician immediately available to respond to emergencies  [facility] immediately available ER MD    Physician(s)  Drs. Lord and [employer]    Medication changes reported      No    Fall or balance concerns reported     No    Warm-up and Cool-down  Performed as group-led Higher education careers adviser Performed  Yes    VAD Patient?  No      Pain Assessment   Currently in Pain?  No/denies          Social History   Tobacco Use  Smoking Status Former Smoker  . Packs/day: 1.50  . Years: 50.00  . Pack years: 75.00  . Types: Cigarettes  . Last attempt to quit: 09/29/2008  . Years since quitting: 9.2  Smokeless Tobacco Never Used    Goals Met:  Proper associated with RPD/PD & O2 Sat Independence with exercise equipment Using PLB without cueing & demonstrates good technique Exercise tolerated well No report of cardiac concerns or symptoms Strength training completed today  Goals Unmet:  Not Applicable  Comments: Pt able to follow exercise prescription today without complaint.  Will continue to monitor for progression.    Dr. Emily Filbert is Medical Director for [place] and [facility].

## 2017-12-31 DIAGNOSIS — J449 Chronic obstructive pulmonary disease, unspecified: Secondary | ICD-10-CM

## 2017-12-31 DIAGNOSIS — J849 Interstitial pulmonary disease, unspecified: Secondary | ICD-10-CM | POA: Diagnosis not present

## 2017-12-31 NOTE — Progress Notes (Signed)
Daily Session Note  Patient Details  Name: Billy Wise MRN: [id number] Date of Birth: [birth year] Referring Provider:     Pulmonary Rehab from 11/30/2017 in [facility] Cardiac and Pulmonary Rehab  Referring Provider  Ramachandran      Encounter Date: 12/31/2017  Check In: Session Check In - 12/31/17 1012      Check-In   Location  [facility]-Cardiac & Pulmonary Rehab    Staff Present  Renita Papa, RN BSN;Mandi Anthem, BS, PEC;Gini Caputo Cool    Supervising physician immediately available to respond to emergencies  [facility] immediately available ER MD    Physician(s)  Dr. Burlene Arnt and Corky Downs    Medication changes reported      No    Fall or balance concerns reported     No    Tobacco Cessation  No Change    Warm-up and Cool-down  Performed as group-led instruction    Resistance Training Performed  Yes    VAD Patient?  No      Pain Assessment   Currently in Pain?  No/denies          Social History   Tobacco Use  Smoking Status Former Smoker  . Packs/day: 1.50  . Years: 50.00  . Pack years: 75.00  . Types: Cigarettes  . Last attempt to quit: 09/29/2008  . Years since quitting: 9.2  Smokeless Tobacco Never Used    Goals Met:  Independence with exercise equipment Exercise tolerated well No report of cardiac concerns or symptoms Strength training completed today  Goals Unmet:  Not Applicable  Comments: Pt able to follow exercise prescription today without complaint.  Will continue to monitor for progression.   Dr. Emily Filbert is Medical Director for [place] and [facility].

## 2018-01-03 DIAGNOSIS — J849 Interstitial pulmonary disease, unspecified: Secondary | ICD-10-CM | POA: Diagnosis not present

## 2018-01-03 DIAGNOSIS — J449 Chronic obstructive pulmonary disease, unspecified: Secondary | ICD-10-CM

## 2018-01-03 NOTE — Progress Notes (Signed)
Daily Session Note  Patient Details  Name: Billy Wise MRN: [id number] Date of Birth: 08-07-1946 Referring Provider:     Pulmonary Rehab from 11/30/2017 in [facility] Cardiac and Pulmonary Rehab  Referring Provider  Ramachandran      Encounter Date: 01/03/2018  Check In: Session Check In - 01/03/18 0958      Check-In   Location  [facility]-Cardiac & Pulmonary Rehab    Staff Present  Earlean Shawl, BS, ACSM CEP, Exercise Physiologist;Amanda Oletta Darter, BA, ACSM CEP, Exercise Physiologist;Toniya Rozar Flavia Shipper    Supervising physician immediately available to respond to emergencies  [facility] immediately available ER MD    Physician(s)  Dr. Burlene Arnt and Corky Downs    Medication changes reported      No    Fall or balance concerns reported     No    Tobacco Cessation  No Change    Warm-up and Cool-down  Performed as group-led instruction    Resistance Training Performed  Yes    VAD Patient?  No      Pain Assessment   Currently in Pain?  No/denies          Social History   Tobacco Use  Smoking Status Former Smoker  . Packs/day: 1.50  . Years: 50.00  . Pack years: 75.00  . Types: Cigarettes  . Last attempt to quit: 09/29/2008  . Years since quitting: 9.2  Smokeless Tobacco Never Used    Goals Met:  Independence with exercise equipment Exercise tolerated well No report of cardiac concerns or symptoms Strength training completed today  Goals Unmet:  Not Applicable  Comments: Pt able to follow exercise prescription today without complaint.  Will continue to monitor for progression.   Dr. Emily Filbert is Medical Director for [place] and [facility].

## 2018-01-05 DIAGNOSIS — J449 Chronic obstructive pulmonary disease, unspecified: Secondary | ICD-10-CM

## 2018-01-05 DIAGNOSIS — J849 Interstitial pulmonary disease, unspecified: Secondary | ICD-10-CM | POA: Diagnosis not present

## 2018-01-05 NOTE — Progress Notes (Signed)
Daily Session Note  Patient Details  Name: Billy Wise MRN: [id number] Date of Birth: [birth year] Referring Provider:     Pulmonary Rehab from 11/30/2017 in [facility] Cardiac and Pulmonary Rehab  Referring Provider  Ramachandran      Encounter Date: 01/05/2018  Check In: Session Check In - 01/05/18 0955      Check-In   Location  [facility]-Cardiac & Pulmonary Rehab    Staff Present  Nada Maclachlan, BA, ACSM CEP, Exercise Physiologist;Lenorris Karger Darrin Nipper, [place], RCEP, [place], Exercise Physiologist    Supervising physician immediately available to respond to emergencies  [facility] immediately available ER MD    Physician(s)  Dr. Joni Fears and Jimmye Norman    Medication changes reported      No    Fall or balance concerns reported     No    Tobacco Cessation  No Change    Warm-up and Cool-down  Performed as group-led instruction    Resistance Training Performed  Yes    VAD Patient?  No      Pain Assessment   Currently in Pain?  No/denies          Social History   Tobacco Use  Smoking Status Former Smoker  . Packs/day: 1.50  . Years: 50.00  . Pack years: 75.00  . Types: Cigarettes  . Last attempt to quit: 09/29/2008  . Years since quitting: 9.2  Smokeless Tobacco Never Used    Goals Met:  Independence with exercise equipment Exercise tolerated well No report of cardiac concerns or symptoms Strength training completed today  Goals Unmet:  Not Applicable  Comments: Pt able to follow exercise prescription today without complaint.  Will continue to monitor for progression.   Dr. Emily Filbert is Medical Director for [place] and [facility].

## 2018-01-07 ENCOUNTER — Encounter: Payer: Medicare Other | Admitting: *Deleted

## 2018-01-07 DIAGNOSIS — J449 Chronic obstructive pulmonary disease, unspecified: Secondary | ICD-10-CM

## 2018-01-07 DIAGNOSIS — J849 Interstitial pulmonary disease, unspecified: Secondary | ICD-10-CM | POA: Diagnosis not present

## 2018-01-07 NOTE — Progress Notes (Signed)
Daily Session Note  Patient Details  Name: Billy Wise MRN: [id number] Date of Birth: 02-26-1946 Referring Provider:     Pulmonary Rehab from 11/30/2017 in [facility] Cardiac and Pulmonary Rehab  Referring Provider  Ramachandran      Encounter Date: 01/07/2018  Check In: Session Check In - 01/07/18 1011      Check-In   Location  [facility]-Cardiac & Pulmonary Rehab    Staff Present  Renita Papa, RN Vickki Hearing, BA, ACSM CEP, Exercise Physiologist;Joseph Flavia Shipper    Supervising physician immediately available to respond to emergencies  [facility] immediately available ER MD    Physician(s)  Drs. Joni Fears and [place]    Medication changes reported      No    Fall or balance concerns reported     No    Warm-up and Cool-down  Performed as group-led Higher education careers adviser Performed  Yes    VAD Patient?  No      Pain Assessment   Currently in Pain?  No/denies          Social History   Tobacco Use  Smoking Status Former Smoker  . Packs/day: 1.50  . Years: 50.00  . Pack years: 75.00  . Types: Cigarettes  . Last attempt to quit: 09/29/2008  . Years since quitting: 9.2  Smokeless Tobacco Never Used    Goals Met:  Proper associated with RPD/PD & O2 Sat Independence with exercise equipment Using PLB without cueing & demonstrates good technique Exercise tolerated well No report of cardiac concerns or symptoms Strength training completed today  Goals Unmet:  Not Applicable  Comments: Pt able to follow exercise prescription today without complaint.  Will continue to monitor for progression.    Dr. Emily Filbert is Medical Director for [place] and [facility].

## 2018-01-10 DIAGNOSIS — J449 Chronic obstructive pulmonary disease, unspecified: Secondary | ICD-10-CM

## 2018-01-10 DIAGNOSIS — J849 Interstitial pulmonary disease, unspecified: Secondary | ICD-10-CM | POA: Diagnosis not present

## 2018-01-10 NOTE — Progress Notes (Signed)
Daily Session Note  Patient Details  Name: Billy Wise MRN: [id number] Date of Birth: July 12, 1946 Referring Provider:     Pulmonary Rehab from 11/30/2017 in [facility] Cardiac and Pulmonary Rehab  Referring Provider  Ramachandran      Encounter Date: 01/10/2018  Check In: Session Check In - 01/10/18 1016      Check-In   Location  [facility]Cardiac [facility] Pulmonary Re[facility]    Staff Present  Earlean Shawl, BS, ACSM CEP, Exercise Physiologist;Amanda Oletta Darter, BA, ACSM CEP, Exercise Physiologist;Bacilio Abascal Flavia Shipper    Supervising physician immediately available to respond to emergencies  [facility] immediately available ER MD    Physician(s)  Dr. Alfred Levins and Siadecki    Medication changes reported      No    Fall or balance concerns reported     No    Tobacco Cessation  No Change    Warm-up and Cool-down  Performed as group-led instruction    Resistance Training Performed  Yes    VAD Patient?  No      Pain Assessment   Currently in Pain?  No/denies          Social History   Tobacco Use  Smoking Status Former Smoker  . Packs/day: 1.50  . Years: 50.00  . Pack years: 75.00  . Types: Cigarettes  . Last attempt to quit: 09/29/2008  . Years since quitting: 9.2  Smokeless Tobacco Never Used    Goals Met:  Independence with exercise equipment Exercise tolerated well No report of cardiac concerns or symptoms Strength training completed today  Goals Unmet:  Not Applicable  Comments: Pt able to follow exercise prescription today without complaint.  Will continue to monitor for progression.   Dr. Emily Filbert is Medical Director for [place] and [facility].

## 2018-01-12 DIAGNOSIS — J449 Chronic obstructive pulmonary disease, unspecified: Secondary | ICD-10-CM

## 2018-01-12 DIAGNOSIS — J849 Interstitial pulmonary disease, unspecified: Secondary | ICD-10-CM | POA: Diagnosis not present

## 2018-01-12 NOTE — Progress Notes (Signed)
Daily Session Note  Patient Details  Name: Billy Wise MRN: [id number] Date of Birth: 1946-02-09 Referring Provider:     Pulmonary Rehab from 11/30/2017 in [facility] Cardiac and Pulmonary Rehab  Referring Provider  Ramachandran      Encounter Date: 01/12/2018  Check In: Session Check In - 01/12/18 1017      Check-In   Location  [facility]Cardiac & Pulmonary Rehab    Staff Present  Alberteen Sam, MA, RCEP, CCRP, Exercise Physiologist;Amanda Oletta Darter, [place], ACSM CEP, Exercise Physiologist;Sadia Belfiore Flavia Shipper    Supervising physician immediately available to respond to emergencies  [facility] immediately available ER MD    Physician(s)  Dr. Mariea Clonts and Jimmye Norman    Medication changes reported      No    Fall or balance concerns reported     No    Tobacco Cessation  No Change    Warm-up and Cool-down  Performed as group-led instruction    Resistance Training Performed  Yes    VAD Patient?  No      Pain Assessment   Currently in Pain?  No/denies          Social History   Tobacco Use  Smoking Status Former Smoker  . Packs/day: 1.50  . Years: 50.00  . Pack years: 75.00  . Types: Cigarettes  . Last attempt to quit: 09/29/2008  . Years since quitting: 9.2  Smokeless Tobacco Never Used    Goals Met:  Independence with exercise equipment Exercise tolerated well No report of cardiac concerns or symptoms Strength training completed today  Goals Unmet:  Not Applicable  Comments: Pt able to follow exercise prescription today without complaint.  Will continue to monitor for progression.   Dr. Emily Filbert is Medical Director for [place] and [facility].

## 2018-01-17 ENCOUNTER — Encounter: Payer: Medicare Other | Attending: Internal Medicine | Admitting: *Deleted

## 2018-01-17 DIAGNOSIS — J449 Chronic obstructive pulmonary disease, unspecified: Secondary | ICD-10-CM | POA: Diagnosis not present

## 2018-01-17 DIAGNOSIS — J849 Interstitial pulmonary disease, unspecified: Secondary | ICD-10-CM | POA: Diagnosis present

## 2018-01-17 NOTE — Progress Notes (Signed)
Daily Session Note  Patient Details  Name: Billy Wise MRN: [id number] Date of Birth: [birth year] Referring Provider:     Pulmonary Rehab from 11/30/2017 in [facility] Cardiac and Pulmonary Rehab  Referring Provider  Ramachandran      Encounter Date: 01/17/2018  Check In: Session Check In - 01/17/18 1027      Check-In   Location  [facility]Cardiac [facility] Pulmonary Re[facility]    Staff Present  Earlean Shawl, BS, ACSM CEP, Exercise Physiologist;Amanda Oletta Darter, BA, ACSM CEP, Exercise Physiologist;Joseph Flavia Shipper    Supervising physician immediately available to respond to emergencies  [facility] immediately available ER MD    Physician(s)  Drs. [place] and [place]     Medication changes reported      No    Fall or balance concerns reported     No    Warm-up and Cool-down  Performed as group-led Higher education careers adviser Performed  Yes    VAD Patient?  No      Pain Assessment   Currently in Pain?  No/denies    Multiple Pain Sites  No          Social History   Tobacco Use  Smoking Status Former Smoker  . Packs/day: 1.50  . Years: 50.00  . Pack years: 75.00  . Types: Cigarettes  . Last attempt to quit: 09/29/2008  . Years since quitting: 9.3  Smokeless Tobacco Never Used    Goals Met:  Proper associated with RPD/PD & O2 Sat Independence with exercise equipment Exercise tolerated well No report of cardiac concerns or symptoms Strength training completed today  Goals Unmet:  Not Applicable  Comments: Pt able to follow exercise prescription today without complaint.  Will continue to monitor for progression.    Dr. Emily Filbert is Medical Director for [place] and [facility].

## 2018-01-19 DIAGNOSIS — J849 Interstitial pulmonary disease, unspecified: Secondary | ICD-10-CM | POA: Diagnosis not present

## 2018-01-19 DIAGNOSIS — J449 Chronic obstructive pulmonary disease, unspecified: Secondary | ICD-10-CM

## 2018-01-19 NOTE — Progress Notes (Signed)
Daily Session Note  Patient Details  Name: Billy Wise MRN: [id number] Date of Birth: [birth year] Referring Provider:     Pulmonary Rehab from 11/30/2017 in [facility] Cardiac and Pulmonary Rehab  Referring Provider  Ramachandran      Encounter Date: 01/19/2018  Check In: Session Check In - 01/19/18 1016      Check-In   Location  [facility]Cardiac & Pulmonary Rehab    Staff Present  Justin Mend RCP,RRT,BSRT;Amanda Oletta Darter, BA, ACSM CEP, Exercise Physiologist;Jessica Luan [place], [place], RCEP, CCRP, Exercise Physiologist    Supervising physician immediately available to respond to emergencies  [facility] immediately available ER MD    Physician(s)  Dr. Mable Paris and Jimmye Norman    Medication changes reported      No    Fall or balance concerns reported     No    Tobacco Cessation  No Change    Warm-up and Cool-down  Performed as group-led instruction    Resistance Training Performed  Yes    VAD Patient?  No      Pain Assessment   Currently in Pain?  No/denies          Social History   Tobacco Use  Smoking Status Former Smoker  . Packs/day: 1.50  . Years: 50.00  . Pack years: 75.00  . Types: Cigarettes  . Last attempt to quit: 09/29/2008  . Years since quitting: 9.3  Smokeless Tobacco Never Used    Goals Met:  Independence with exercise equipment Exercise tolerated well No report of cardiac concerns or symptoms Strength training completed today  Goals Unmet:  Not Applicable  Comments: Pt able to follow exercise prescription today without complaint.  Will continue to monitor for progression.   Dr. Emily Filbert is Medical Director for [place] and [facility].

## 2018-01-21 ENCOUNTER — Encounter: Payer: Medicare Other | Admitting: *Deleted

## 2018-01-21 DIAGNOSIS — J849 Interstitial pulmonary disease, unspecified: Secondary | ICD-10-CM | POA: Diagnosis not present

## 2018-01-21 DIAGNOSIS — J449 Chronic obstructive pulmonary disease, unspecified: Secondary | ICD-10-CM

## 2018-01-21 NOTE — Progress Notes (Signed)
Daily Session Note  Patient Details  Name: Billy Wise MRN: [id number] Date of Birth: [birth year] Referring Provider:     Pulmonary Rehab from 11/30/2017 in [facility] Cardiac and Pulmonary Rehab  Referring Provider  Ramachandran      Encounter Date: 01/21/2018  Check In: Session Check In - 01/21/18 1106      Check-In   Location  [facility]Card[facility] Pulmonary Re[facility]    Staff Present  Nyoka Cowden, RN, BSN, Glori Bickers, BS, Olga Coaster, RN BSN    Supervising physician immediately available to respond to emergencies  [facility] immediately available ER MD    Physician(s)  Drs. Mariea Clonts and [place]    Medication changes reported      No    Fall or balance concerns reported     No    Tobacco Cessation  No Change    Warm-up and Cool-down  Performed as group-led instruction    Resistance Training Performed  Yes    VAD Patient?  No      Pain Assessment   Currently in Pain?  No/denies    Multiple Pain Sites  No          Social History   Tobacco Use  Smoking Status Former Smoker  . Packs/day: 1.50  . Years: 50.00  . Pack years: 75.00  . Types: Cigarettes  . Last attempt to quit: 09/29/2008  . Years since quitting: 9.3  Smokeless Tobacco Never Used    Goals Met:  Proper associated with RPD/PD & O2 Sat Independence with exercise equipment Using PLB without cueing & demonstrates good technique Exercise tolerated well Strength training completed today  Goals Unmet:  Not Applicable  Comments: Pt able to follow exercise prescription today without complaint.  Will continue to monitor for progression.    Dr. Emily Filbert is Medical Director for [place] and [facility].

## 2018-01-24 DIAGNOSIS — J449 Chronic obstructive pulmonary disease, unspecified: Secondary | ICD-10-CM

## 2018-01-24 DIAGNOSIS — J849 Interstitial pulmonary disease, unspecified: Secondary | ICD-10-CM | POA: Diagnosis not present

## 2018-01-24 NOTE — Progress Notes (Signed)
Daily Session Note  Patient Details  Name: Billy Wise MRN: [id number] Date of Birth: 04-Nov-1945 Referring Provider:     Pulmonary Rehab from 11/30/2017 in [facility] Cardiac and Pulmonary Rehab  Referring Provider  Ramachandran      Encounter Date: 01/24/2018  Check In: Session Check In - 01/24/18 1011      Check-In   Location  [facility]Cardiac & Pulmonary Rehab    Staff Present  Earlean Shawl, BS, ACSM CEP, Exercise Physiologist;Joseph Foy [place], BA, ACSM CEP, Exercise Physiologist    Supervising physician immediately available to respond to emergencies  [facility] immediately available ER MD    Physician(s)  Dr. Kerman Passey and Corky Downs    Medication changes reported      No    Fall or balance concerns reported     No    Tobacco Cessation  No Change    Warm-up and Cool-down  Performed as group-led instruction    Resistance Training Performed  Yes    VAD Patient?  No      Pain Assessment   Currently in Pain?  No/denies          Social History   Tobacco Use  Smoking Status Former Smoker  . Packs/day: 1.50  . Years: 50.00  . Pack years: 75.00  . Types: Cigarettes  . Last attempt to quit: 09/29/2008  . Years since quitting: 9.3  Smokeless Tobacco Never Used    Goals Met:  Independence with exercise equipment Exercise tolerated well No report of cardiac concerns or symptoms Strength training completed today  Goals Unmet:  Not Applicable  Comments: Pt able to follow exercise prescription today without complaint.  Will continue to monitor for progression.   Dr. Emily Filbert is Medical Director for [place] and [facility].

## 2018-01-24 NOTE — Progress Notes (Signed)
Pulmonary Individual Treatment Plan  Patient Details  Name: Billy Wise MRN: [id number] Date of Birth: 06-Aug-1946 Referring Provider:     Pulmonary Rehab from 11/30/2017 in [facility]  Referring Provider  Ramachandran      Initial Encounter Date:    Pulmonary Rehab from 11/30/2017 in [facility] Cardiac and Pulmonary Rehab  Date  11/30/17  Referring Provider  Ashby Dawes      Visit Diagnosis: COPD with chronic bronchitis and emphysema ([place])  Patient's Home Medications on Admission:  Current Outpatient Medications:  .  acidophilus (RISAQUAD) CAPS capsule, Take 1 capsule by mouth daily., Disp: , Rfl:  .  amLODipine (NORVASC) 10 MG tablet, Take 10 mg by mouth daily., Disp: , Rfl: 0 .  aspirin EC 81 MG tablet, Take 81 mg by mouth daily., Disp: , Rfl:  .  BYETTA 10 MCG PEN 10 MCG/0.04ML SOPN injection, Inject 10 mcg into the skin 2 (two) times daily with a meal. , Disp: , Rfl: 0 .  finasteride (PROSCAR) 5 MG tablet, Take 5 mg by mouth daily., Disp: , Rfl: 0 .  glimepiride (AMARYL) 4 MG tablet, Take 4 mg by mouth daily with breakfast. , Disp: , Rfl: 0 .  guaiFENesin-codeine 100-10 MG/5ML syrup, Take 15 mLs by mouth every 4 (four) hours as needed for cough., Disp: 120 mL, Rfl: 0 .  ipratropium-albuterol (DUONEB) 0.5-2.5 (3) MG/3ML SOLN, Take 3 mLs by nebulization every 6 (six) hours., Disp: 360 mL, Rfl: 2 .  LEVEMIR FLEXTOUCH 100 UNIT/ML Pen, Inject 77 Units into the skin at bedtime. , Disp: , Rfl: 0 .  metFORMIN (GLUCOPHAGE) 1000 MG tablet, Take 1 tablet by mouth 2 (two) times daily., Disp: , Rfl: 0 .  naproxen (NAPROSYN) 500 MG tablet, Take 500 mg by mouth 2 (two) times daily with a meal., Disp: , Rfl:  .  omeprazole (PRILOSEC) 20 MG capsule, Take 20 mg by mouth daily., Disp: , Rfl: 0 .  oxybutynin (DITROPAN-XL) 10 MG 24 hr tablet, Take 10 mg by mouth daily., Disp: , Rfl: 0 .  polycarbophil (FIBERCON) 625 MG tablet, Take 625 mg by mouth every evening., Disp: , Rfl:   .  PROAIR HFA 108 (90 Base) MCG/ACT inhaler, Take 2 puffs by mouth every 4 (four) hours as needed for wheezing. , Disp: , Rfl: 0 .  quinapril (ACCUPRIL) 40 MG tablet, Take 40 mg by mouth daily., Disp: , Rfl: 0 .  simvastatin (ZOCOR) 20 MG tablet, Take 20 mg by mouth every evening., Disp: , Rfl: 0 .  SPIRIVA HANDIHALER 18 MCG inhalation capsule, Take 18 mcg by mouth daily. , Disp: , Rfl: 0 .  SYMBICORT 80-4.5 MCG/ACT inhaler, Take 2 puffs by mouth 2 (two) times daily., Disp: , Rfl: 0 .  tamsulosin (FLOMAX) 0.4 MG CAPS capsule, Take 1 capsule by mouth at bedtime. , Disp: , Rfl: 0 .  traZODone (DESYREL) 50 MG tablet, Take 50 mg by mouth at bedtime., Disp: , Rfl: 0  Past Medical History: Past Medical History:  Diagnosis Date  . Actinic keratoses   . BPH (benign prostatic hyperplasia)   . CKD stage 1 due to type 2 diabetes mellitus ([place])   . COPD (chronic obstructive pulmonary disease) ([place])   . Diabetes mellitus without complication ([place])   . GERD (gastroesophageal reflux disease)   . Hypertension   . Insomnia   . Iron deficiency anemia   . Mixed hyperlipidemia   . OSA (obstructive sleep apnea)     Tobacco  Use: Social History   Tobacco Use  Smoking Status Former Smoker  . Packs/day: 1.50  . Years: 50.00  . Pack years: 75.00  . Types: Cigarettes  . Last attempt to quit: 09/29/2008  . Years since quitting: 9.3  Smokeless Tobacco Never Used    Labs: Recent Review Scientist, physiological    Labs for ITP Cardiac and Pulmonary Rehab Latest Ref Rng & Units 10/29/2017 10/29/2017   Hemoglobin A1c 4.8 - 5.6 % 7.2(H) 7.2(H)       Pulmonary Assessment Scores: Pulmonary Assessment Scores    Row Name 11/30/17 1147         ADL UCSD   ADL Phase  Entry     SOB Score total  79     Rest  1     Walk  1     Stairs  5     Bath  2     Dress  3     Shop  4       CAT Score   CAT Score  26       mMRC Score   mMRC Score  1        Pulmonary Function Assessment: Pulmonary Function  Assessment - 11/30/17 1038      Pulmonary Function Tests   FVC%  88 % Date performed 11/30/17 [facility]    FEV1%  48 %    FEV1/FVC Ratio  43      Breath   Bilateral Breath Sounds  Clear    Shortness of Breath  Yes;Fear of Shortness of Breath;Limiting activity;Panic with Shortness of Breath       Exercise Target Goals:    Exercise Program Goal: Individual exercise prescription set using results from initial 6 min walk test and THRR while considering  patient's activity barriers and safety.    Exercise Prescription Goal: Initial exercise prescription builds to 30-45 minutes a day of aerobic activity, 2-3 days per week.  Home exercise guidelines will be given to patient during program as part of exercise prescription that the participant will acknowledge.  Activity Barriers & Risk Stratification:   6 Minute Walk: 6 Minute Walk    Row Name 11/30/17 1141         6 Minute Walk   Phase  Initial     Distance  1350 feet     Walk Time  6 minutes     # of Rest Breaks  0     MPH  2.55     METS  3.09     RPE  11     Perceived Dyspnea   2     VO2 Peak  10.84     Symptoms  No     Resting HR  96 bpm     Resting BP  100/56     Resting Oxygen Saturation   90 %     Exercise Oxygen Saturation  during 6 min walk  85 %     Max Ex. HR  126 bpm     Max Ex. BP  126/54     2 Minute Post BP  110/58       Interval HR   1 Minute HR  113     2 Minute HR  115     3 Minute HR  117     4 Minute HR  117     5 Minute HR  117     6 Minute HR  126  2 Minute Post HR  97     Interval Heart Rate?  Yes       Interval Oxygen   Interval Oxygen?  Yes     Baseline Oxygen Saturation %  90 %     1 Minute Oxygen Saturation %  87 %     1 Minute Liters of Oxygen  0 L     2 Minute Oxygen Saturation %  85 %     2 Minute Liters of Oxygen  0 L     3 Minute Oxygen Saturation %  85 %     3 Minute Liters of Oxygen  0 L     4 Minute Oxygen Saturation %  86 %     4 Minute Liters of Oxygen  0 L     5 Minute  Oxygen Saturation %  87 %     5 Minute Liters of Oxygen  0 L     6 Minute Oxygen Saturation %  86 %     6 Minute Liters of Oxygen  0 L     2 Minute Post Oxygen Saturation %  93 %     2 Minute Post Liters of Oxygen  0 L       Oxygen Initial Assessment: Oxygen Initial Assessment - 11/30/17 1046      Home Oxygen   Home Oxygen Device  Home Concentrator;E-Tanks    Sleep Oxygen Prescription  Continuous    Liters per minute  2    Home Exercise Oxygen Prescription  None    Home at Rest Exercise Oxygen Prescription  None    Compliance with Home Oxygen Use  Yes      Initial 6 min Walk   Oxygen Used  None      Program Oxygen Prescription   Program Oxygen Prescription  None      Intervention   Short Term Goals  To learn and exhibit compliance with exercise, home and travel O2 prescription;To learn and understand importance of maintaining oxygen saturations>88%;To learn and demonstrate proper use of respiratory medications;To learn and understand importance of monitoring SPO2 with pulse oximeter and demonstrate accurate use of the pulse oximeter.;To learn and demonstrate proper pursed lip breathing techniques or other breathing techniques.    Long  Term Goals  Exhibits compliance with exercise, home and travel O2 prescription;Verbalizes importance of monitoring SPO2 with pulse oximeter and return demonstration;Maintenance of O2 saturations>88%;Exhibits proper breathing techniques, such as pursed lip breathing or other method taught during program session;Compliance with respiratory medication;Demonstrates proper use of MDI's       Oxygen Re-Evaluation: Oxygen Re-Evaluation    Row Name 12/10/17 1039 01/12/18 1048           Program Oxygen Prescription   Program Oxygen Prescription  None  None        Home Oxygen   Home Oxygen Device  Home Concentrator;E-Tanks  Home Concentrator;E-Tanks      Sleep Oxygen Prescription  Continuous  Continuous      Liters per minute  2  2      Home  Exercise Oxygen Prescription  None  None      Home at Rest Exercise Oxygen Prescription  None  None      Compliance with Home Oxygen Use  Yes  Yes        Goals/Expected Outcomes   Short Term Goals  To learn and exhibit compliance with exercise, home and travel O2 prescription;To learn and understand importance of maintaining  oxygen saturations>88%;To learn and demonstrate proper use of respiratory medications;To learn and understand importance of monitoring SPO2 with pulse oximeter and demonstrate accurate use of the pulse oximeter.;To learn and demonstrate proper pursed lip breathing techniques or other breathing techniques.  To learn and exhibit compliance with exercise, home and travel O2 prescription;To learn and understand importance of maintaining oxygen saturations>88%;To learn and demonstrate proper use of respiratory medications;To learn and understand importance of monitoring SPO2 with pulse oximeter and demonstrate accurate use of the pulse oximeter.;To learn and demonstrate proper pursed lip breathing techniques or other breathing techniques.      Long  Term Goals  Exhibits compliance with exercise, home and travel O2 prescription;Verbalizes importance of monitoring SPO2 with pulse oximeter and return demonstration;Maintenance of O2 saturations>88%;Exhibits proper breathing techniques, such as pursed lip breathing or other method taught during program session;Compliance with respiratory medication;Demonstrates proper use of MDI's  Exhibits compliance with exercise, home and travel O2 prescription;Verbalizes importance of monitoring SPO2 with pulse oximeter and return demonstration;Maintenance of O2 saturations>88%;Exhibits proper breathing techniques, such as pursed lip breathing or other method taught during program session;Compliance with respiratory medication;Demonstrates proper use of MDI's      Comments  Reviewed PLB technique with pt.  Talked about how it work and it's important to  maintaining his exercise saturations.    Azlaan feels like his breathing has got alot better since the start of the program. He takes his albuterol treatments at least once a day. He practices with his incentive spirometer at times.  He uses a spacer for his inhalers and uses them independently.      Goals/Expected Outcomes  Short: Become more profiecient at using PLB.   Long: Become independent at using PLB.  Short: continue to increase his exercise load. Long: independently increase walking speed as tolerated keeping oxygen above 88 percent         Oxygen Discharge (Final Oxygen Re-Evaluation): Oxygen Re-Evaluation - 01/12/18 1048      Program Oxygen Prescription   Program Oxygen Prescription  None      Home Oxygen   Home Oxygen Device  Home Concentrator;E-Tanks    Sleep Oxygen Prescription  Continuous    Liters per minute  2    Home Exercise Oxygen Prescription  None    Home at Rest Exercise Oxygen Prescription  None    Compliance with Home Oxygen Use  Yes      Goals/Expected Outcomes   Short Term Goals  To learn and exhibit compliance with exercise, home and travel O2 prescription;To learn and understand importance of maintaining oxygen saturations>88%;To learn and demonstrate proper use of respiratory medications;To learn and understand importance of monitoring SPO2 with pulse oximeter and demonstrate accurate use of the pulse oximeter.;To learn and demonstrate proper pursed lip breathing techniques or other breathing techniques.    Long  Term Goals  Exhibits compliance with exercise, home and travel O2 prescription;Verbalizes importance of monitoring SPO2 with pulse oximeter and return demonstration;Maintenance of O2 saturations>88%;Exhibits proper breathing techniques, such as pursed lip breathing or other method taught during program session;Compliance with respiratory medication;Demonstrates proper use of MDI's    Comments  Rilley feels like his breathing has got alot better since  the start of the program. He takes his albuterol treatments at least once a day. He practices with his incentive spirometer at times.  He uses a spacer for his inhalers and uses them independently.    Goals/Expected Outcomes  Short: continue to increase his exercise load. Long: independently increase  walking speed as tolerated keeping oxygen above 88 percent       Initial Exercise Prescription: Initial Exercise Prescription - 11/30/17 1100      Date of Initial Exercise RX and Referring Provider   Date  11/30/17    Referring Provider  Ashby Dawes      Treadmill   MPH  2.5    Grade  0.5    Minutes  15    METs  3      Recumbant Bike   Level  3    RPM  60    Watts  28    Minutes  15    METs  3      Arm Ergometer   Level  2    Watts  44    RPM  40    Minutes  15    METs  3      Recumbant Elliptical   Level  2    RPM  60    Minutes  15    METs  3      REL-XR   Level  2    Speed  50    Minutes  15    METs  3      Prescription Details   Frequency (times per week)  3    Duration  Progress to 45 minutes of aerobic exercise without signs/symptoms of physical distress      Intensity   THRR 40-80% of Max Heartrate  118-140    Ratings of Perceived Exertion  11-15    Perceived Dyspnea  0-4      Resistance Training   Training Prescription  Yes    Weight  3 lb    Reps  10-15       Perform Capillary Blood Glucose checks as needed.  Exercise Prescription Changes: Exercise Prescription Changes    Row Name 12/15/17 1200 12/24/17 1000 12/29/17 1200 01/12/18 1200       Response to Exercise   Blood Pressure (Admit)  132/64  -  118/60  124/64    Blood Pressure (Exercise)  134/66  -  -  -    Blood Pressure (Exit)  116/58  -  124/70  136/60    Heart Rate (Admit)  111 bpm  -  93 bpm  108 bpm    Heart Rate (Exercise)  123 bpm  -  122 bpm  118 bpm    Heart Rate (Exit)  100 bpm  -  104 bpm  98 bpm    Oxygen Saturation (Admit)  90 %  -  91 %  87 %    Oxygen Saturation  (Exercise)  90 %  -  88 %  90 %    Oxygen Saturation (Exit)  89 %  -  91 %  92 %    Rating of Perceived Exertion (Exercise)  12  -  13  12    Perceived Dyspnea (Exercise)  1  -  1  1    Symptoms  none  -  none  noone    Duration  Continue with 45 min of aerobic exercise without signs/symptoms of physical distress.  -  Continue with 45 min of aerobic exercise without signs/symptoms of physical distress.  Continue with 45 min of aerobic exercise without signs/symptoms of physical distress.    Intensity  THRR unchanged  -  THRR unchanged  THRR unchanged      Progression   Progression  Continue to progress workloads  to maintain intensity without signs/symptoms of physical distress.  Continue to progress workloads to maintain intensity without signs/symptoms of physical distress.  Continue to progress workloads to maintain intensity without signs/symptoms of physical distress.  Continue to progress workloads to maintain intensity without signs/symptoms of physical distress.    Average METs  2.9  2.9  3.3  2.7      Resistance Training   Training Prescription  Yes  Yes  Yes  Yes    Weight  3 lb  3 lb  3 lb  7 lb    Reps  10-15  10-15  10-15  10-15      Interval Training   Interval Training  No  No  No  No      Treadmill   MPH  2.5  2.5  2.5  -    Grade  _0 -    Minutes  _1 -    METs  3.6  3.6  3.6  -      Recumbant Bike   Level  -  -  3  4    RPM  -  -  70  75    Watts  -  -  27  32    Minutes  -  -  15  15    METs  -  -  3  3      T5 Nustep   Level  2  2  -  4    SPM  80  80  -  87    Minutes  15  15  -  15    METs  2.2  2.2  -  2.4      Home Exercise Plan   Plans to continue exercise at  -  [employer]  -  -    Frequency  -  Add 1 additional day to program exercise sessions. walking at home  -  -    Initial Home Exercises Provided  -  12/24/17  -  -       Exercise Comments: Exercise Comments    Row Name 12/10/17 1038 12/24/17 1109         Exercise  Comments   First full day of exercise!  Patient was oriented to gym and equipment including functions, settings, policies, and procedures.  Patient's individual exercise prescription and treatment plan were reviewed.  All starting workloads were established based on the results of the 6 minute walk test done at initial orientation visit.  The plan for exercise progression was also introduced and progression will be customized based on patient's performance and goals  Home exercise reviewed with patient. Handout given and signed. Reviewed target heart rate, RPE, dyspnea scale, and safety. Patient verbalized understanding. He will add an extra day of exercise at home. He is planning on attending [facility] [employer]         Exercise Goals and Review: Exercise Goals    Row Name 11/30/17 1158             Exercise Goals   Increase Physical Activity  Yes       Intervention  Provide advice, education, support and counseling about physical activity/exercise needs.;Develop an individualized exercise prescription for aerobic and resistive training based on initial evaluation findings, risk stratification, comorbidities and participant's personal goals.       Expected Outcomes  Short Term: Attend rehab on a regular basis to  increase amount of physical activity.;Long Term: Add in home exercise to make exercise part of routine and to increase amount of physical activity.;Long Term: Exercising regularly at least 3-5 days a week.       Increase Strength and Stamina  Yes       Intervention  Provide advice, education, support and counseling about physical activity/exercise needs.;Develop an individualized exercise prescription for aerobic and resistive training based on initial evaluation findings, risk stratification, comorbidities and participant's personal goals.       Expected Outcomes  Short Term: Increase workloads from initial exercise prescription for resistance, speed, and METs.;Short Term: Perform resistance  training exercises routinely during rehab and add in resistance training at home;Long Term: Improve cardiorespiratory fitness, muscular endurance and strength as measured by increased METs and functional capacity (6MWT)       Able to understand and use rate of perceived exertion (RPE) scale  Yes       Intervention  Provide education and explanation on how to use RPE scale       Expected Outcomes  Short Term: Able to use RPE daily in rehab to express subjective intensity level;Long Term:  Able to use RPE to guide intensity level when exercising independently       Able to understand and use Dyspnea scale  Yes       Intervention  Provide education and explanation on how to use Dyspnea scale       Expected Outcomes  Short Term: Able to use Dyspnea scale daily in rehab to express subjective sense of shortness of breath during exertion;Long Term: Able to use Dyspnea scale to guide intensity level when exercising independently       Knowledge and understanding of Target Heart Rate Range (THRR)  Yes       Intervention  Provide education and explanation of THRR including how the numbers were predicted and where they are located for reference       Expected Outcomes  Short Term: Able to state/look up THRR;Long Term: Able to use THRR to govern intensity when exercising independently;Short Term: Able to use daily as guideline for intensity in rehab       Able to check pulse independently  Yes       Intervention  Provide education and demonstration on how to check pulse in carotid and radial arteries.;Review the importance of being able to check your own pulse for safety during independent exercise       Expected Outcomes  Short Term: Able to explain why pulse checking is important during independent exercise;Long Term: Able to check pulse independently and accurately       Understanding of Exercise Prescription  Yes       Intervention  Provide education, explanation, and written materials on patient's individual  exercise prescription       Expected Outcomes  Short Term: Able to explain program exercise prescription;Long Term: Able to explain home exercise prescription to exercise independently          Exercise Goals Re-Evaluation : Exercise Goals Re-Evaluation    Row Name 12/10/17 1038 12/15/17 1241 12/24/17 1056 12/29/17 1302 01/12/18 1249     Exercise Goal Re-Evaluation   Exercise Goals Review  Understanding of Exercise Prescription;Able to understand and use Dyspnea scale;Knowledge and understanding of Target Heart Rate Range (THRR);Able to understand and use rate of perceived exertion (RPE) scale  Increase Physical Activity;Increase Strength and Stamina;Able to understand and use Dyspnea scale;Able to understand and use rate of perceived exertion (  RPE) scale  Increase Physical Activity;Increase Strength and Stamina;Able to understand and use Dyspnea scale;Able to understand and use rate of perceived exertion (RPE) scale;Understanding of Exercise Prescription;Knowledge and understanding of Target Heart Rate Range (THRR);Able to check pulse independently  Increase Physical Activity;Increase Strength and Stamina;Able to understand and use Dyspnea scale;Able to understand and use rate of perceived exertion (RPE) scale  Increase Physical Activity;Able to understand and use rate of perceived exertion (RPE) scale;Increase Strength and Stamina;Able to understand and use Dyspnea scale;Knowledge and understanding of Target Heart Rate Range (THRR);Understanding of Exercise Prescription   Comments  Reviewed RPE scale, THR and program prescription with pt today.  Pt voiced understanding and was given a copy of goals to take home.   Filimon has tolerated exercise well so far.  He is working at a higher incline on [employer] exercise done with patient. Handout given and signed.  Pt is progressing well - average MET level is up to 3.3  Staff will continue to monitor  Richardson Landry is progressing well and has moved up his DB weight and  intensity on . Staff will continute to monitor   Expected Outcomes  Short: Use RPE daily to regulate intensity.  Long: Follow program prescription in THR.  Short - Allin will continue to attend regularly Long - [place] will improve overall MET level  Short: add one extra day of exercise outside of the program. Long: become indpendent with exercise after graduation  Short - Pt will attend regularly Long - Pt will continue to increase MET level  Short - Richardson Landry will attend regularly and exercise at home  [place] will maintain gains on his own      Discharge Exercise Prescription (Final Exercise Prescription Changes): Exercise Prescription Changes - 01/12/18 1200      Response to Exercise   Blood Pressure (Admit)  124/64    Blood Pressure (Exit)  136/60    Heart Rate (Admit)  108 bpm    Heart Rate (Exercise)  118 bpm    Heart Rate (Exit)  98 bpm    Oxygen Saturation (Admit)  87 %    Oxygen Saturation (Exercise)  90 %    Oxygen Saturation (Exit)  92 %    Rating of Perceived Exertion (Exercise)  12    Perceived Dyspnea (Exercise)  1    Symptoms  noone    Duration  Continue with 45 min of aerobic exercise without signs/symptoms of physical distress.    Intensity  THRR unchanged      Progression   Progression  Continue to progress workloads to maintain intensity without signs/symptoms of physical distress.    Average METs  2.7      Resistance Training   Training Prescription  Yes    Weight  7 lb    Reps  10-15      Interval Training   Interval Training  No      Recumbant Bike   Level  4    RPM  75    Watts  32    Minutes  15    METs  3      T5 Nustep   Level  4    SPM  87    Minutes  15    METs  2.4       Nutrition:  Target Goals: Understanding of nutrition guidelines, daily intake of sodium <1554m, cholesterol <2053m calories 30% from fat and 7% or less from saturated fats, daily to have 5  or more servings of fruits and vegetables.  Biometrics: Pre Biometrics -  11/30/17 1144      Pre Biometrics   Height  5' 5.25" (1.657 m)    Weight  179 lb 6.4 oz (81.4 kg)    Waist Circumference  41.5 inches    Hip Circumference  42 inches    Waist to Hip Ratio  0.99 %    BMI (Calculated)  29.64        Nutrition Therapy Plan and Nutrition Goals: Nutrition Therapy & Goals - 11/30/17 1037      Personal Nutrition Goals   Comments  eat healthier and lose some wieght.      Intervention Plan   Intervention  Prescribe, educate and counsel regarding individualized specific dietary modifications aiming towards targeted core components such as weight, hypertension, lipid management, diabetes, heart failure and other comorbidities.;Nutrition handout(s) given to patient.    Expected Outcomes  Short Term Goal: Understand basic principles of dietary content, such as calories, fat, sodium, cholesterol and nutrients.;Short Term Goal: A plan has been developed with personal nutrition goals set during dietitian appointment.;Long Term Goal: Adherence to prescribed nutrition plan.       Nutrition Assessments: Nutrition Assessments - 11/30/17 1035      MEDFICTS Scores   Pre Score  108       Nutrition Goals Re-Evaluation: Nutrition Goals Re-Evaluation    [place] Name 12/15/17 1028 01/12/18 1054           Goals   Current Weight  176 lb (79.8 kg)  178 lb (80.7 kg)      Nutrition Goal  Lose some weight. Eat as healthy as he can.  Still lose weight      Comment  He would like to lose some more weight to help him breath. He does not want to meet with the dietician since he has seen a dietician for his diabetes.  Some days he is eating what he wants. He says he will eat a hamburger, steak or fries at least once a week. He says sometimes he gets really full and sometimes he doesn't.       Expected Outcome  Short: lose 5 more pounds. Long: maintain weight Loss.  Short: lose 5 more pounds. Long: maintain weight Loss.         Nutrition Goals Discharge (Final Nutrition Goals  Re-Evaluation): Nutrition Goals Re-Evaluation - 01/12/18 1054      Goals   Current Weight  178 lb (80.7 kg)    Nutrition Goal  Still lose weight    Comment  Some days he is eating what he wants. He says he will eat a hamburger, steak or fries at least once a week. He says sometimes he gets really full and sometimes he doesn't.     Expected Outcome  Short: lose 5 more pounds. Long: maintain weight Loss.       Psychosocial: Target Goals: Acknowledge presence or absence of significant depression and/or stress, maximize coping skills, provide positive support system. Participant is able to verbalize types and ability to use techniques and skills needed for reducing stress and depression.   Initial Review & Psychosocial Screening: Initial Psych Review & Screening - 11/30/17 1032      Initial Review   Current issues with  Current Sleep Concerns;Current Stress Concerns    Source of Stress Concerns  Chronic Illness;Unable to perform yard/household activities;Unable to participate in former interests or hobbies    Comments  His yard work got to  be too much for him, he cant swim like he wants to. He lost 10 pounds recently and has not exercised in many years.      Family Dynamics   Good Support System?  Yes    Comments  He looks to his wife and his daughter inlaw for support      Barriers   Psychosocial barriers to participate in program  The patient should benefit from training in stress management and relaxation.      Screening Interventions   Interventions  Program counselor consult;Encouraged to exercise;Provide feedback about the scores to participant;To provide support and resources with identified psychosocial needs    Expected Outcomes  Short Term goal: Utilizing psychosocial counselor, staff and physician to assist with identification of specific Stressors or current issues interfering with healing process. Setting desired goal for each stressor or current issue identified.;Long Term  goal: The participant improves quality of Life and PHQ9 Scores as seen by post scores and/or verbalization of changes;Short Term goal: Identification and review with participant of any Quality of Life or Depression concerns found by scoring the questionnaire.;Long Term Goal: Stressors or current issues are controlled or eliminated.       Quality of Life Scores:  Scores of 19 and below usually indicate a poorer quality of life in these areas.  A difference of  2-3 points is a clinically meaningful difference.  A difference of 2-3 points in the total score of the Quality of Life Index has been associated with significant improvement in overall quality of life, self-image, physical symptoms, and general health in studies assessing change in quality of life.  PHQ-9: Recent Review Flowsheet Data    Depression screen [facility] 2/9 12/13/2017 11/30/2017   Decreased Interest 3 3   Down, Depressed, Hopeless 0 0   PHQ - 2 Score 3 3   Altered sleeping 1 3   Tired, decreased energy 3 3   Change in appetite 0 0   Feeling bad or failure about yourself  0 0   Trouble concentrating 0 0   Moving slowly or fidgety/restless 0 0   Suicidal thoughts 0 0   PHQ-9 Score 7 9   Difficult doing work/chores Not difficult at all Not difficult at all     Interpretation of Total Score  Total Score Depression Severity:  1-4 = Minimal depression, 5-9 = Mild depression, 10-14 = Moderate depression, 15-19 = Moderately severe depression, 20-27 = Severe depression   Psychosocial Evaluation and Intervention: Psychosocial Evaluation - 12/13/17 1115      Psychosocial Evaluation & Interventions   Interventions  Stress management education;Relaxation education;Encouraged to exercise with the program and follow exercise prescription    Comments  Counselor met with Mr. Gatta today for initial psychosocial evaluation.  He is a 72 year old who has COPD and was hospitalized recently for flu and pneumonia.  He has a strong support  system with a spouse; several sons and their families; and active involvement in his local church.  Anay reports his sleep has improved just recently with a change of medication back to Ambien since the Trazodone was not helpful.  He has a good appetite and denies a history of depression or anxiety - or any current symptoms.  Kayne states he is typically in a positive mood and has minimal stress in his life - although his spouse is scheduled for surgery this Friday and he is concerned about that.  He has goals for this program to help him breathe better;  to reduce the use of Oxygen; to improve his quality of breathing and to exercise consistently.  Staff will follow with [place] throughout the course of this program.      Expected Outcomes  Niyam will benefit from consistent exercise to achieve his stated goals.  The educational and psychoeducational components of this program will help him understand; manage and cope better with life in general.  Staff will follow.     Continue Psychosocial Services   Follow up required by staff       Psychosocial Re-Evaluation: Psychosocial Re-Evaluation    [place] Name 12/15/17 1031 01/12/18 1058           Psychosocial Re-Evaluation   Current issues with  Current Sleep Concerns;Current Stress Concerns  Current Sleep Concerns;Current Stress Concerns      Comments  Caven has been sleeping better. His oxygen and a wedge pillow has helped him sleep. He has a sleep number bed coming this week. Exercising in rehab has helped him sleep better.  Jhase states his sleeping has got a little better since last time. His attitude is positive and says the program has helped him breath better.      Expected Outcomes  Short: use his pillow and new bed to sleep better. Long: maintain a good sleep pattern and be compliant with equipment.  Short: continue exercise to reduce stress. Long: independently exercise to keep stress to a miminum.      Interventions  Encouraged to attend  Pulmonary Rehabilitation for the exercise  Encouraged to attend Pulmonary Rehabilitation for the exercise      Continue Psychosocial Services   Follow up required by staff  Follow up required by staff         Psychosocial Discharge (Final Psychosocial Re-Evaluation): Psychosocial Re-Evaluation - 01/12/18 1058      Psychosocial Re-Evaluation   Current issues with  Current Sleep Concerns;Current Stress Concerns    Comments  Dalante states his sleeping has got a little better since last time. His attitude is positive and says the program has helped him breath better.    Expected Outcomes  Short: continue exercise to reduce stress. Long: independently exercise to keep stress to a miminum.    Interventions  Encouraged to attend Pulmonary Rehabilitation for the exercise    Continue Psychosocial Services   Follow up required by staff       Education: Education Goals: Education classes will be provided on a weekly basis, covering required topics. Participant will state understanding/return demonstration of topics presented.  Learning Barriers/Preferences: Learning Barriers/Preferences - 11/30/17 1039      Learning Barriers/Preferences   Learning Barriers  Sight wears glasses    Learning Preferences  None       Education Topics:  Initial Evaluation Education: - Verbal, written and demonstration of respiratory meds, oximetry and breathing techniques. Instruction on use of nebulizers and MDIs and importance of monitoring MDI activations.   Pulmonary Rehab from 01/17/2018 in [facility] Cardiac and Pulmonary Rehab  Date  11/30/17  Educator  [facility]  Instruction Review Code  1- Verbalizes Understanding      General Nutrition Guidelines/Fats and Fiber: -Group instruction provided by verbal, written material, models and posters to present the general guidelines for heart healthy nutrition. Gives an explanation and review of dietary fats and fiber.   Pulmonary Rehab from 01/17/2018 in [facility] Cardiac and  Pulmonary Rehab  Date  01/10/18  Educator  CR  Instruction Review Code  1- Verbalizes Understanding  Controlling Sodium/Reading Food Labels: -Group verbal and written material supporting the discussion of sodium use in heart healthy nutrition. Review and explanation with models, verbal and written materials for utilization of the food label.   Pulmonary Rehab from 01/17/2018 in [facility] Cardiac and Pulmonary Rehab  Date  01/17/18  Educator  CR  Instruction Review Code  1- Verbalizes Understanding      Exercise Physiology & General Exercise Guidelines: - Group verbal and written instruction with models to review the exercise physiology of the cardiovascular system and associated critical values. Provides general exercise guidelines with specific guidelines to those with heart or lung disease.    Aerobic Exercise & Resistance Training: - Gives group verbal and written instruction on the various components of exercise. Focuses on aerobic and resistive training programs and the benefits of this training and how to safely progress through these programs.   Flexibility, Balance, Mind/Body Relaxation: Provides group verbal/written instruction on the benefits of flexibility and balance training, including mind/body exercise modes such as yoga, pilates and tai chi.  Demonstration and skill practice provided.   Stress and Anxiety: - Provides group verbal and written instruction about the health risks of elevated stress and causes of high stress.  Discuss the correlation between heart/lung disease and anxiety and treatment options. Review healthy ways to manage with stress and anxiety.   Pulmonary Rehab from 01/17/2018 in [facility] Cardiac and Pulmonary Rehab  Date  12/22/17  Educator  [facility]  Instruction Review Code  1- Verbalizes Understanding      Depression: - Provides group verbal and written instruction on the correlation between heart/lung disease and depressed mood, treatment options, and the  stigmas associated with seeking treatment.   Exercise & Equipment Safety: - Individual verbal instruction and demonstration of equipment use and safety with use of the equipment.   Pulmonary Rehab from 01/17/2018 in [facility] Cardiac and Pulmonary Rehab  Date  11/30/17  Educator  [facility]  Instruction Review Code  1- Verbalizes Understanding      Infection Prevention: - Provides verbal and written material to individual with discussion of infection control including proper hand washing and proper equipment cleaning during exercise session.   Pulmonary Rehab from 01/17/2018 in [facility] Cardiac and Pulmonary Rehab  Date  11/30/17  Educator  [facility]  Instruction Review Code  1- Verbalizes Understanding      Falls Prevention: - Provides verbal and written material to individual with discussion of falls prevention and safety.   Pulmonary Rehab from 01/17/2018 in [facility] Cardiac and Pulmonary Rehab  Date  11/30/17  Educator  [facility]  Instruction Review Code  1- Verbalizes Understanding      Diabetes: - Individual verbal and written instruction to review signs/symptoms of diabetes, desired ranges of glucose level fasting, after meals and with exercise. Advice that pre and post exercise glucose checks will be done for 3 sessions at entry of program.   Chronic Lung Diseases: - Group verbal and written instruction to review updates, respiratory medications, advancements in procedures and treatments. Discuss use of supplemental oxygen including available portable oxygen systems, continuous and intermittent flow rates, concentrators, personal use and safety guidelines. Review proper use of inhaler and spacers. Provide informative websites for self-education.    Pulmonary Rehab from 01/17/2018 in [facility] Cardiac and Pulmonary Rehab  Date  01/12/18  Educator  [facility]  Instruction Review Code  1- Verbalizes Understanding      Energy Conservation: - Provide group verbal and written instruction for methods to conserve energy, plan  and  organize activities. Instruct on pacing techniques, use of adaptive equipment and posture/positioning to relieve shortness of breath.   Pulmonary Rehab from 01/17/2018 in [facility] Cardiac and Pulmonary Rehab  Date  12/15/17  Educator  [facility]  Instruction Review Code  1- Verbalizes Understanding      Triggers and Exacerbations: - Group verbal and written instruction to review types of environmental triggers and ways to prevent exacerbations. Discuss weather changes, air quality and the benefits of nasal washing. Review warning signs and symptoms to help prevent infections. Discuss techniques for effective airway clearance, coughing, and vibrations.   AED/CPR: - Group verbal and written instruction with the use of models to demonstrate the basic use of the AED with the basic ABC's of resuscitation.   Pulmonary Rehab from 01/17/2018 in [facility] Cardiac and Pulmonary Rehab  Date  12/17/17  Educator  [facility]  Instruction Review Code  1- Actuary and Physiology of the Lungs: - Group verbal and written instruction with the use of models to provide basic lung anatomy and physiology related to function, structure and complications of lung disease.   Pulmonary Rehab from 01/17/2018 in [facility] Cardiac and Pulmonary Rehab  Date  12/29/17  Educator  [facility]  Instruction Review Code  1- Verbalizes Understanding      Anatomy & Physiology of the Heart: - Group verbal and written instruction and models provide basic cardiac anatomy and physiology, with the coronary electrical and arterial systems. Review of Valvular disease and Heart Failure   Cardiac Medications: - Group verbal and written instruction to review commonly prescribed medications for heart disease. Reviews the medication, class of the drug, and side effects.   Know Your Numbers and Risk Factors: -Group verbal and written instruction about important numbers in your health.  Discussion of what are risk factors and how they play a  role in the disease process.  Review of Cholesterol, Blood Pressure, Diabetes, and BMI and the role they play in your overall health.   Sleep Hygiene: -Provides group verbal and written instruction about how sleep can affect your health.  Define sleep hygiene, discuss sleep cycles and impact of sleep habits. Review good sleep hygiene tips.    Other: -Provides group and verbal instruction on various topics (see comments)    Knowledge Questionnaire Score: Knowledge Questionnaire Score - 11/30/17 1039      Knowledge Questionnaire Score   Pre Score  15/18 reviewed with patient        Core Components/Risk Factors/Patient Goals at Admission: Personal Goals and Risk Factors at Admission - 11/30/17 1047      Core Components/Risk Factors/Patient Goals on Admission    Weight Management  Yes;Weight Loss    Intervention  Weight Management: Develop a combined nutrition and exercise program designed to reach desired caloric intake, while maintaining appropriate intake of nutrient and fiber, sodium and fats, and appropriate energy expenditure required for the weight goal.;Weight Management: Provide education and appropriate resources to help participant work on and attain dietary goals.;Weight Management/Obesity: Establish reasonable short term and long term weight goals.    Admit Weight  179 lb 6.4 oz (81.4 kg)    Goal Weight: Short Term  174 lb (78.9 kg)    Goal Weight: Long Term  170 lb (77.1 kg)    Expected Outcomes  Short Term: Continue to assess and modify interventions until short term weight is achieved;Long Term: Adherence to nutrition and physical activity/exercise program aimed toward attainment of established weight goal;Weight Maintenance: Understanding  of the daily nutrition guidelines, which includes 25-35% calories from fat, 7% or less cal from saturated fats, less than 26m cholesterol, less than 1.5gm of sodium, & 5 or more servings of fruits and vegetables daily;Weight Loss:  Understanding of general recommendations for a balanced deficit meal plan, which promotes 1-2 lb weight loss per week and includes a negative energy balance of [phone] kcal/d;Understanding recommendations for meals to include 15-35% energy as protein, 25-35% energy from fat, 35-60% energy from carbohydrates, less than 2059mof dietary cholesterol, 20-35 gm of total fiber daily;Understanding of distribution of calorie intake throughout the day with the consumption of 4-5 meals/snacks    Improve shortness of breath with ADL's  Yes    Intervention  Provide education, individualized exercise plan and daily activity instruction to help decrease symptoms of SOB with activities of daily living.    Expected Outcomes  Short Term: Improve cardiorespiratory fitness to achieve a reduction of symptoms when performing ADLs;Long Term: Be able to perform more ADLs without symptoms or delay the onset of symptoms    Diabetes  Yes    Intervention  Provide education about signs/symptoms and action to take for hypo/hyperglycemia.;Provide education about proper nutrition, including hydration, and aerobic/resistive exercise prescription along with prescribed medications to achieve blood glucose in normal ranges: Fasting glucose 65-99 mg/dL    Expected Outcomes  Short Term: Participant verbalizes understanding of the signs/symptoms and immediate care of hyper/hypoglycemia, proper foot care and importance of medication, aerobic/resistive exercise and nutrition plan for blood glucose control.;Long Term: Attainment of HbA1C < 7%.    Hypertension  Yes    Intervention  Provide education on lifestyle modifcations including regular physical activity/exercise, weight management, moderate sodium restriction and increased consumption of fresh fruit, vegetables, and low fat dairy, alcohol moderation, and smoking cessation.;Monitor prescription use compliance.    Expected Outcomes  Long Term: Maintenance of blood pressure at goal  levels.;Short Term: Continued assessment and intervention until BP is < 140/9042mG in hypertensive participants. < 130/42m18m in hypertensive participants with diabetes, heart failure or chronic kidney disease.    Lipids  Yes    Intervention  Provide education and support for participant on nutrition & aerobic/resistive exercise along with prescribed medications to achieve LDL <70mg69mL >40mg.68mExpected Outcomes  Short Term: Participant states understanding of desired cholesterol values and is compliant with medications prescribed. Participant is following exercise prescription and nutrition guidelines.;Long Term: Cholesterol controlled with medications as prescribed, with individualized exercise RX and with personalized nutrition plan. Value goals: LDL < 70mg, 19m> 40 mg.       Core Components/Risk Factors/Patient Goals Review:  Goals and Risk Factor Review    Row Name 01/12/18 1044             Core Components/Risk Factors/Patient Goals Review   Personal Goals Review  Weight Management/Obesity;Improve shortness of breath with ADL's;Diabetes;Hypertension;Lipids       Review  Douglass Abielike his breathing has got alot better since the start of the program. He feels alot stronger and that his breathing is easier. He checks his blood sugar 3 times a day and has no issues checking. He has a wellness appointment in June to check is lipids and other labs.       Expected Outcomes  Short: go to his appointment to check cholestorol. Long: keep cholestorol within normal limits.          Core Components/Risk Factors/Patient Goals at Discharge (Final Review):  Goals and Risk  Factor Review - 01/12/18 1044      Core Components/Risk Factors/Patient Goals Review   Personal Goals Review  Weight Management/Obesity;Improve shortness of breath with ADL's;Diabetes;Hypertension;Lipids    Review  Carlo feels like his breathing has got alot better since the start of the program. He feels alot stronger  and that his breathing is easier. He checks his blood sugar 3 times a day and has no issues checking. He has a wellness appointment in June to check is lipids and other labs.    Expected Outcomes  Short: go to his appointment to check cholestorol. Long: keep cholestorol within normal limits.       ITP Comments: ITP Comments    Row Name 11/30/17 1138 12/27/17 0829 01/24/18 0840       ITP Comments  Medical Evaluation completed. Chart sent for review and changes to Dr. Emily Filbert Director of [place]. Diagnosis can be found in [facility] encounter 11/15/17  30 day review completed. ITP sent to Dr. Emily Filbert Director of [place]. Continue with ITP unless changes are made by physician.   30 day review completed. ITP sent to Dr. Emily Filbert Director of [place]. Continue with ITP unless changes are made by physician        Comments: 30 day review

## 2018-01-26 DIAGNOSIS — J449 Chronic obstructive pulmonary disease, unspecified: Secondary | ICD-10-CM

## 2018-01-26 DIAGNOSIS — J849 Interstitial pulmonary disease, unspecified: Secondary | ICD-10-CM | POA: Diagnosis not present

## 2018-01-26 NOTE — Progress Notes (Signed)
Daily Session Note  Patient Details  Name: SANTIEL TOPPER MRN: [id number] Date of Birth: 03/14/1946 Referring Provider:     Pulmonary Rehab from 11/30/2017 in [facility] Cardiac and Pulmonary Rehab  Referring Provider  Ramachandran      Encounter Date: 01/26/2018  Check In: Session Check In - 01/26/18 1038      Check-In   Location  [facility]-Cardiac & Pulmonary Rehab    Staff Present  Alberteen Sam, MA, RCEP, CCRP, Exercise Physiologist;[place] Oletta Darter, [place], ACSM CEP, Exercise Physiologist;Joseph Flavia Shipper    Supervising physician immediately available to respond to emergencies  [facility] immediately available ER MD    Physician(s)  Jimmye Norman and Clearnce Hasten    Medication changes reported      No    Fall or balance concerns reported     No    Warm-up and Cool-down  Performed as group-led Higher education careers adviser Performed  Yes    VAD Patient?  No      Pain Assessment   Currently in Pain?  No/denies    Multiple Pain Sites  No          Social History   Tobacco Use  Smoking Status Former Smoker  . Packs/day: 1.50  . Years: 50.00  . Pack years: 75.00  . Types: Cigarettes  . Last attempt to quit: 09/29/2008  . Years since quitting: 9.3  Smokeless Tobacco Never Used    Goals Met:  Proper associated with RPD/PD & O2 Sat Independence with exercise equipment Exercise tolerated well Strength training completed today  Goals Unmet:  Not Applicable  Comments: Pt able to follow exercise prescription today without complaint.  Will continue to monitor for progression.    Dr. Emily Filbert is Medical Director for [place] and [facility].

## 2018-01-28 DIAGNOSIS — J849 Interstitial pulmonary disease, unspecified: Secondary | ICD-10-CM | POA: Diagnosis not present

## 2018-01-28 DIAGNOSIS — J449 Chronic obstructive pulmonary disease, unspecified: Secondary | ICD-10-CM

## 2018-01-28 NOTE — Progress Notes (Signed)
Daily Session Note  Patient Details  Name: JAKEEM GRAPE MRN: [id number] Date of Birth: [birth year] Referring Provider:     Pulmonary Rehab from 11/30/2017 in [facility] Cardiac and Pulmonary Rehab  Referring Provider  Ramachandran      Encounter Date: 01/28/2018  Check In: Session Check In - 01/28/18 4497      Check-In   Location  [facility]-Cardiac & Pulmonary Rehab    Staff Present  Justin Mend RCP,RRT,BSRT;Meredith Sherryll Burger, RN BSN;Jessica Luan Pulling, MA, RCEP, CCRP, Exercise Physiologist    Supervising physician immediately available to respond to emergencies  [facility] immediately available ER MD    Physician(s)  Dr. Alfred Levins and Pasduchowski    Medication changes reported      No    Fall or balance concerns reported     No    Tobacco Cessation  No Change    Warm-up and Cool-down  Performed as group-led instruction    Resistance Training Performed  Yes    VAD Patient?  No      Pain Assessment   Currently in Pain?  No/denies          Social History   Tobacco Use  Smoking Status Former Smoker  . Packs/day: 1.50  . Years: 50.00  . Pack years: 75.00  . Types: Cigarettes  . Last attempt to quit: 09/29/2008  . Years since quitting: 9.3  Smokeless Tobacco Never Used    Goals Met:  Independence with exercise equipment Exercise tolerated well No report of cardiac concerns or symptoms Strength training completed today  Goals Unmet:  Not Applicable  Comments: Pt able to follow exercise prescription today without complaint.  Will continue to monitor for progression.   Dr. Emily Filbert is Medical Director for [place] and [facility].

## 2018-01-31 DIAGNOSIS — J449 Chronic obstructive pulmonary disease, unspecified: Secondary | ICD-10-CM

## 2018-01-31 DIAGNOSIS — J849 Interstitial pulmonary disease, unspecified: Secondary | ICD-10-CM | POA: Diagnosis not present

## 2018-01-31 NOTE — Progress Notes (Signed)
Daily Session Note  Patient Details  Name: Billy Wise MRN: [id number] Date of Birth: 01/29/1946 Referring Provider:     Pulmonary Rehab from 11/30/2017 in [facility] Cardiac and Pulmonary Rehab  Referring Provider  Ramachandran      Encounter Date: 01/31/2018  Check In: Session Check In - 01/31/18 0958      Check-In   Location  [facility]    Staff Present  Earlean Shawl, BS, ACSM CEP, Exercise Physiologist;Amanda Oletta Darter, BA, ACSM CEP, Exercise Physiologist;Joseph Flavia Shipper    Supervising physician immediately available to respond to emergencies  [facility] immediately available ER MD    Physician(s)   Dr. Burlene Arnt and Joni Fears    Medication changes reported      No    Fall or balance concerns reported     No    Tobacco Cessation  No Change    Warm-up and Cool-down  Performed as group-led instruction    Resistance Training Performed  Yes    VAD Patient?  No      Pain Assessment   Currently in Pain?  No/denies          Social History   Tobacco Use  Smoking Status Former Smoker  . Packs/day: 1.50  . Years: 50.00  . Pack years: 75.00  . Types: Cigarettes  . Last attempt to quit: 09/29/2008  . Years since quitting: 9.3  Smokeless Tobacco Never Used    Goals Met:  Independence with exercise equipment Exercise tolerated well No report of cardiac concerns or symptoms Strength training completed today  Goals Unmet:  Not Applicable  Comments: Pt able to follow exercise prescription today without complaint.  Will continue to monitor for progression.   Dr. Emily Filbert is Medical Director for [place] and [facility].

## 2018-02-01 ENCOUNTER — Ambulatory Visit (HOSPITAL_COMMUNITY): Payer: Medicare Other

## 2018-02-01 ENCOUNTER — Ambulatory Visit
Admission: RE | Admit: 2018-02-01 | Discharge: 2018-02-01 | Disposition: A | Discharge status: Home / Self Care | Payer: Medicare Other | Source: Ambulatory Visit | Attending: Internal Medicine | Admitting: Internal Medicine

## 2018-02-01 ENCOUNTER — Ambulatory Visit: Payer: Medicare Other

## 2018-02-01 DIAGNOSIS — J449 Chronic obstructive pulmonary disease, unspecified: Secondary | ICD-10-CM | POA: Diagnosis not present

## 2018-02-01 DIAGNOSIS — J849 Interstitial pulmonary disease, unspecified: Secondary | ICD-10-CM | POA: Diagnosis not present

## 2018-02-01 DIAGNOSIS — J439 Emphysema, unspecified: Secondary | ICD-10-CM | POA: Insufficient documentation

## 2018-02-01 DIAGNOSIS — I700 Atherosclerosis of aorta: Secondary | ICD-10-CM | POA: Diagnosis not present

## 2018-02-01 DIAGNOSIS — I2510 Atherosclerotic heart disease of native coronary artery without angina pectoris: Secondary | ICD-10-CM | POA: Insufficient documentation

## 2018-02-01 DIAGNOSIS — R911 Solitary pulmonary nodule: Secondary | ICD-10-CM | POA: Insufficient documentation

## 2018-02-01 MED ORDER — ALBUTEROL SULFATE (2.5 MG/3ML) 0.083% IN NEBU
2.5000 mg | INHALATION_SOLUTION | Freq: Once | RESPIRATORY_TRACT | Status: AC
  Administered 2018-02-01: 2.5 mg via RESPIRATORY_TRACT
  Filled 2018-02-01: qty 3

## 2018-02-02 DIAGNOSIS — J449 Chronic obstructive pulmonary disease, unspecified: Secondary | ICD-10-CM

## 2018-02-02 DIAGNOSIS — J849 Interstitial pulmonary disease, unspecified: Secondary | ICD-10-CM | POA: Diagnosis not present

## 2018-02-02 NOTE — Progress Notes (Signed)
Daily Session Note  Patient Details  Name: Billy Wise MRN: [id number] Date of Birth: [birth year] Referring Provider:     Pulmonary Rehab from 11/30/2017 in [facility] Cardiac and Pulmonary Rehab  Referring Provider  Ramachandran      Encounter Date: 02/02/2018  Check In: Session Check In - 02/02/18 1000      Check-In   Location  [facility]    Staff Present  Justin Mend RCP,RRT,BSRT;Amanda Oletta Darter, BA, ACSM CEP, Exercise Physiologist;Jessica Luan Pulling, [place], RCEP, CCRP, Exercise Physiologist    Supervising physician immediately available to respond to emergencies  [facility] immediately available ER MD    Physician(s)  Dr. Owens Shark and Jimmye Norman    Medication changes reported      No    Fall or balance concerns reported     No    Tobacco Cessation  No Change    Warm-up and Cool-down  Performed as group-led instruction    Resistance Training Performed  Yes    VAD Patient?  No      Pain Assessment   Currently in Pain?  No/denies          Social History   Tobacco Use  Smoking Status Former Smoker  . Packs/day: 1.50  . Years: 50.00  . Pack years: 75.00  . Types: Cigarettes  . Last attempt to quit: 09/29/2008  . Years since quitting: 9.3  Smokeless Tobacco Never Used    Goals Met:  Independence with exercise equipment Exercise tolerated well No report of cardiac concerns or symptoms Strength training completed today  Goals Unmet:  Not Applicable  Comments: Pt able to follow exercise prescription today without complaint.  Will continue to monitor for progression.   Dr. Emily Filbert is Medical Director for [place] and [facility].

## 2018-02-04 ENCOUNTER — Encounter: Payer: Medicare Other | Admitting: *Deleted

## 2018-02-04 DIAGNOSIS — J449 Chronic obstructive pulmonary disease, unspecified: Secondary | ICD-10-CM

## 2018-02-04 DIAGNOSIS — J849 Interstitial pulmonary disease, unspecified: Secondary | ICD-10-CM | POA: Diagnosis not present

## 2018-02-04 NOTE — Progress Notes (Signed)
Daily Session Note  Patient Details  Name: Billy Wise MRN: [id number] Date of Birth: [birth year] Referring Provider:     Pulmonary Rehab from 11/30/2017 in [facility] Cardiac and Pulmonary Rehab  Referring Provider  Ramachandran      Encounter Date: 02/04/2018  Check In: Session Check In - 02/04/18 1008      Check-In   Location  [facility]    Staff Present  Renita Papa, RN BSN;Nitza Schmid Luan Pulling, MA, RCEP, CCRP, Exercise Physiologist;Mandi Zachery Conch, BS, [place] physician immediately available to respond to emergencies  [facility] immediately available ER MD    Physician(s)  Drs. Owens Shark and Lord    Medication changes reported      No    Fall or balance concerns reported     No    Warm-up and Cool-down  Performed as group-led Higher education careers adviser Performed  Yes    VAD Patient?  No      Pain Assessment   Currently in Pain?  No/denies          Social History   Tobacco Use  Smoking Status Former Smoker  . Packs/day: 1.50  . Years: 50.00  . Pack years: 75.00  . Types: Cigarettes  . Last attempt to quit: 09/29/2008  . Years since quitting: 9.3  Smokeless Tobacco Never Used    Goals Met:  Proper associated with RPD/PD & O2 Sat Independence with exercise equipment Using PLB without cueing & demonstrates good technique Exercise tolerated well Personal goals reviewed No report of cardiac concerns or symptoms Strength training completed today  Goals Unmet:  Not Applicable  Comments: Pt able to follow exercise prescription today without complaint.  Will continue to monitor for progression. See ITP for goal review  Dr. Emily Filbert is Medical Director for [place] and [facility].

## 2018-02-06 NOTE — Progress Notes (Signed)
[facility]  Pulmonary Medicine Consultation      Assessment and Plan:  COPD with dyspnea on exertion and chronic hypoxic respiratory failure -Symptoms of COPD/emphysema. -Continue spiriva, symbicort, albuterol.   ILD.  -CT chest 02/01/18, shows severe emphysema with air trapping, bibasilar fibrotic changes, do not appear to be in a UIP pattern. -We will continue current management, will consider repeating CT scan in 6-12 months.   OSA. -Known obstructive sleep apnea, intolerant of CPAP due to full size mask over nose and mouth. - Discussed the possible ways of sleep apnea can affect his health, will send for new sleep study and treat as indicated.   Return in about 6 months (around 08/09/2018).   Date: 02/06/2018  MRN# [id number] Billy Wise November 15, 1945    Billy Wise is a 72 y.o. old male seen in consultation for chief complaint of:    Chief Complaint  Patient presents with  . Follow-up    SOB w/activity: cough at times due to pollen:     HPI:   The patient is a 72 year old male, with COPD, with recent exacerbation 10/2017. At last visit he was noted to have crackles on exam, and daytime sleepiness, he was sent for a  CT hi-res which showed severe emphysema with basilar fibrotic changes, and a PFT which was suggestive of moderate COPD.Marland Kitchen   Since his last visit he has been in pulm rehab and feels that he is doing well. He is taking symbicort, 2 puffs bid, rinses mouth. Hes on spiriva handihaler, he is wondering about respimat.He uses albuterol once per day and feels that it helps. He is half way thru pulm rehab.  He works in [employer]. 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 He has never been tested for allergies, and does note only minimal problems with allergies.   He has continues on oxygen on 2L at night.   He was started on CPAP for OSA, but could not get used to the mask. He used a full facial mask.   CBC 10/28/17; eosinophils  equals 0. **CT chest personally reviewed, CT hi-res  02/01/18; chest x-ray 10/28/17; there is mild basilar fibrosis, bronchiectatic changes with bronchial thickening, severe apical emphysema. Findings not likely due to UIP.  **PFT tracings personally reviewed from 02/01/18; FVC is 86% predicted, FEV1 is 67% predicted, there is no improvement with bronchodilator.  Ratio 61%. TLC is 106% predicted, RV to TLC ratio is elevated.  DLCO is reduced at 52%.  Flow volume loop is obstructive.  Forced expiratory time is below 6 seconds. -Overall this test is suggestive of obstructive lung disease, however test is suboptimal due to low forced expiratory time. **Desat walk 11/15/17; baseline sat on RA at rest was 91% and HR 89. Walked 360 feet at a very brisk pace, was conversational. Sat was 88% and HR 112.  Moderate dyspnea.  Medication:    Current Outpatient Medications:  .  acidophilus (RISAQUAD) CAPS capsule, Take 1 capsule by mouth daily., Disp: , Rfl:  .  amLODipine (NORVASC) 10 MG tablet, Take 10 mg by mouth daily., Disp: , Rfl: 0 .  aspirin EC 81 MG tablet, Take 81 mg by mouth daily., Disp: , Rfl:  .  BYETTA 10 MCG PEN 10 MCG/0.04ML SOPN injection, Inject 10 mcg into the skin 2 (two) times daily with a meal. , Disp: , Rfl: 0 .  finasteride (PROSCAR) 5 MG tablet, Take 5 mg by mouth daily., Disp: , Rfl: 0 .  glimepiride (AMARYL) 4 MG tablet, Take 4 mg by mouth daily with breakfast. , Disp: , Rfl: 0 .  guaiFENesin-codeine 100-10 MG/5ML syrup, Take 15 mLs by mouth every 4 (four) hours as needed for cough., Disp: 120 mL, Rfl: 0 .  ipratropium-albuterol (DUONEB) 0.5-2.5 (3) MG/3ML SOLN, Take 3 mLs by nebulization every 6 (six) hours., Disp: 360 mL, Rfl: 2 .  LEVEMIR FLEXTOUCH 100 UNIT/ML Pen, Inject 77 Units into the skin at bedtime. , Disp: , Rfl: 0 .  metFORMIN (GLUCOPHAGE) 1000 MG tablet, Take 1 tablet by mouth 2 (two) times daily., Disp: , Rfl: 0 .  naproxen (NAPROSYN) 500 MG tablet, Take 500 mg by mouth 2  (two) times daily with a meal., Disp: , Rfl:  .  omeprazole (PRILOSEC) 20 MG capsule, Take 20 mg by mouth daily., Disp: , Rfl: 0 .  oxybutynin (DITROPAN-XL) 10 MG 24 hr tablet, Take 10 mg by mouth daily., Disp: , Rfl: 0 .  polycarbophil (FIBERCON) 625 MG tablet, Take 625 mg by mouth every evening., Disp: , Rfl:  .  PROAIR HFA 108 (90 Base) MCG/ACT inhaler, Take 2 puffs by mouth every 4 (four) hours as needed for wheezing. , Disp: , Rfl: 0 .  quinapril (ACCUPRIL) 40 MG tablet, Take 40 mg by mouth daily., Disp: , Rfl: 0 .  simvastatin (ZOCOR) 20 MG tablet, Take 20 mg by mouth every evening., Disp: , Rfl: 0 .  SPIRIVA HANDIHALER 18 MCG inhalation capsule, Take 18 mcg by mouth daily. , Disp: , Rfl: 0 .  SYMBICORT 80-4.5 MCG/ACT inhaler, Take 2 puffs by mouth 2 (two) times daily., Disp: , Rfl: 0 .  tamsulosin (FLOMAX) 0.4 MG CAPS capsule, Take 1 capsule by mouth at bedtime. , Disp: , Rfl: 0 .  traZODone (DESYREL) 50 MG tablet, Take 50 mg by mouth at bedtime., Disp: , Rfl: 0   Allergies:  Patient has no known allergies.  Review of Systems: Gen:  Denies  fever, sweats, chills HEENT: Denies blurred vision, double vision. bleeds, sore throat Cvc:  No dizziness, chest pain. Resp:   Denies cough. Gi: Denies swallowing difficulty, stomach pain. Gu:  Denies bladder incontinence, burning urine Ext:   No Joint pain, stiffness. Skin: No skin rash,  hives  Endoc:  No polyuria, polydipsia. Psych: No depression, insomnia. Other:  All other systems were reviewed with the patient and were negative other that what is mentioned in the HPI.   Physical Examination:   VS: BP 122/70 (BP Location: Left Arm, Cuff Size: Normal)   Pulse 95   Ht 5' 2.25" (1.581 m)   Wt 180 lb (81.6 kg)   SpO2 92%   BMI 32.66 kg/m   General Appearance: No distress  Neuro:without focal findings,  speech normal,  HEENT: PERRLA, EOM intact.  Mallampati 3 Pulmonary: normal breath sounds, No wheezing.  Bibasilar fine inspiratory  crackles. CardiovascularNormal S1,S2.  No m/r/g.   Abdomen: Benign, Soft, non-tender. Renal:  No costovertebral tenderness  GU:  No performed at this time. Endoc: No evident thyromegaly, no signs of acromegaly. Skin:   warm, no rashes, no ecchymosis  Extremities: normal, no cyanosis, clubbing.  Other findings:    LABORATORY PANEL:   CBC No results for input(s): WBC, HGB, HCT, PLT in the last 168 hours. ------------------------------------------------------------------------------------------------------------------  Chemistries  No results for input(s): NA, K, CL, CO2, GLUCOSE, BUN, CREATININE, CALCIUM, MG, AST, ALT, ALKPHOS, BILITOT in the last 168 hours.  Invalid input(s): GFRCGP ------------------------------------------------------------------------------------------------------------------  Cardiac Enzymes No results for  input(s): TROPONINI in the last 168 hours. ------------------------------------------------------------  RADIOLOGY:  No results found.     Thank  you for the consultation and for [facility] to assist in the care of your patient. Our recommendations are noted above.  Please contact us if we can be of further service.   Wells Guileseep Kimberly Nieland, MD.  Board Certified in Internal Medicine, Pulmonary Medicine, Critical Care Medicine, and Sleep Medicine.  [facility] Office Number: [phone]  Santiago Gladavid Kasa, M.D.  Billy Fischeravid Simonds, M.D  02/06/2018

## 2018-02-07 ENCOUNTER — Ambulatory Visit: Payer: Medicare Other | Admitting: Internal Medicine

## 2018-02-07 ENCOUNTER — Encounter: Payer: Self-pay | Admitting: Internal Medicine

## 2018-02-07 VITALS — BP 122/70 | HR 95 | Ht 62.25 in | Wt 180.0 lb

## 2018-02-07 DIAGNOSIS — J849 Interstitial pulmonary disease, unspecified: Secondary | ICD-10-CM

## 2018-02-07 DIAGNOSIS — J449 Chronic obstructive pulmonary disease, unspecified: Secondary | ICD-10-CM | POA: Diagnosis not present

## 2018-02-07 NOTE — Progress Notes (Signed)
Daily Session Note  Patient Details  Name: Billy Wise MRN: [id number] Date of Birth: June 11, 1946 Referring Provider:     Pulmonary Rehab from 11/30/2017 in [facility] Cardiac and Pulmonary Rehab  Referring Provider  Ramachandran      Encounter Date: 02/07/2018  Check In: Session Check In - 02/07/18 0947      Check-In   Location  [facility]    Staff Present  Earlean Shawl, BS, ACSM CEP, Exercise Physiologist;Mandi [place], BS, PEC;Soua Caltagirone [employer] physician immediately available to respond to emergencies  [facility] immediately available ER MD    Physician(s)  Dr. Reita Cliche and Cinda Quest    Medication changes reported      No    Fall or balance concerns reported     No    Tobacco Cessation  No Change    Warm-up and Cool-down  Performed as group-led instruction    Resistance Training Performed  Yes    VAD Patient?  No      Pain Assessment   Currently in Pain?  No/denies          Social History   Tobacco Use  Smoking Status Former Smoker  . Packs/day: 1.50  . Years: 50.00  . Pack years: 75.00  . Types: Cigarettes  . Last attempt to quit: 09/29/2008  . Years since quitting: 9.3  Smokeless Tobacco Never Used    Goals Met:  Independence with exercise equipment Exercise tolerated well No report of cardiac concerns or symptoms Strength training completed today  Goals Unmet:  Not Applicable  Comments: Pt able to follow exercise prescription today without complaint.  Will continue to monitor for progression.   Dr. Emily Filbert is Medical Director for [place] and [facility].

## 2018-02-07 NOTE — Patient Instructions (Signed)
Continue using your inhalers and night time oxygen.  Continue pulmonary rehab.

## 2018-02-09 DIAGNOSIS — J849 Interstitial pulmonary disease, unspecified: Secondary | ICD-10-CM | POA: Diagnosis not present

## 2018-02-09 DIAGNOSIS — J449 Chronic obstructive pulmonary disease, unspecified: Secondary | ICD-10-CM

## 2018-02-09 NOTE — Progress Notes (Signed)
Daily Session Note  Patient Details  Name: Billy Wise MRN: [id number] Date of Birth: Apr 03, 1946 Referring Provider:     Pulmonary Rehab from 11/30/2017 in [facility] Cardiac and Pulmonary Rehab  Referring Provider  Ramachandran      Encounter Date: 02/09/2018  Check In: Session Check In - 02/09/18 1015      Check-In   Location  [facility]    Staff Present  Alberteen Sam, MA, RCEP, CCRP, Exercise Physiologist;Amanda Oletta Darter, [place], ACSM CEP, Exercise Physiologist;Joseph Flavia Shipper    Supervising physician immediately available to respond to emergencies  [facility] immediately available ER MD    Physician(s)  Dr. Archie Balboa and Joni Fears    Medication changes reported      No    Fall or balance concerns reported     No    Tobacco Cessation  No Change    Warm-up and Cool-down  Performed as group-led instruction    Resistance Training Performed  Yes    VAD Patient?  No      Pain Assessment   Currently in Pain?  No/denies          Social History   Tobacco Use  Smoking Status Former Smoker  . Packs/day: 1.50  . Years: 50.00  . Pack years: 75.00  . Types: Cigarettes  . Last attempt to quit: 09/29/2008  . Years since quitting: 9.3  Smokeless Tobacco Never Used    Goals Met:  Independence with exercise equipment Exercise tolerated well No report of cardiac concerns or symptoms Strength training completed today  Goals Unmet:  Not Applicable  Comments: Pt able to follow exercise prescription today without complaint.  Will continue to monitor for progression.   Dr. Emily Filbert is Medical Director for [place] and [facility].

## 2018-02-14 DIAGNOSIS — J849 Interstitial pulmonary disease, unspecified: Secondary | ICD-10-CM | POA: Diagnosis not present

## 2018-02-14 DIAGNOSIS — J449 Chronic obstructive pulmonary disease, unspecified: Secondary | ICD-10-CM

## 2018-02-14 NOTE — Progress Notes (Signed)
Daily Session Note  Patient Details  Name: Billy Wise MRN: [id number] Date of Birth: 10-28-1945 Referring Provider:     Pulmonary Rehab from 11/30/2017 in [facility] Cardiac and Pulmonary Rehab  Referring Provider  Ramachandran      Encounter Date: 02/14/2018  Check In: Session Check In - 02/14/18 0948      Check-In   Location  [facility]    Staff Present  Earlean Shawl, BS, ACSM CEP, Exercise Physiologist;Tyreese Thain [place], [place], RRT, Respiratory Therapist    Supervising physician immediately available to respond to emergencies  [facility] immediately available ER MD    Physician(s)  Dr. Corky Downs and Alfred Levins    Medication changes reported      No    Fall or balance concerns reported     No    Tobacco Cessation  No Change    Warm-up and Cool-down  Performed as group-led instruction    Resistance Training Performed  Yes    VAD Patient?  No      Pain Assessment   Currently in Pain?  No/denies          Social History   Tobacco Use  Smoking Status Former Smoker  . Packs/day: 1.50  . Years: 50.00  . Pack years: 75.00  . Types: Cigarettes  . Last attempt to quit: 09/29/2008  . Years since quitting: 9.3  Smokeless Tobacco Never Used    Goals Met:  Independence with exercise equipment Exercise tolerated well No report of cardiac concerns or symptoms Strength training completed today  Goals Unmet:  Not Applicable  Comments: Pt able to follow exercise prescription today without complaint.  Will continue to monitor for progression.   Dr. Emily Filbert is Medical Director for [place] and [facility].

## 2018-02-16 ENCOUNTER — Encounter: Payer: Medicare Other | Attending: Internal Medicine

## 2018-02-16 DIAGNOSIS — J849 Interstitial pulmonary disease, unspecified: Secondary | ICD-10-CM | POA: Insufficient documentation

## 2018-02-16 DIAGNOSIS — J449 Chronic obstructive pulmonary disease, unspecified: Secondary | ICD-10-CM | POA: Diagnosis not present

## 2018-02-16 NOTE — Progress Notes (Signed)
Daily Session Note  Patient Details  Name: Billy Wise MRN: [id number] Date of Birth: 1946-02-29 Referring Provider:     Pulmonary Rehab from 11/30/2017 in [facility] Cardiac and Pulmonary Rehab  Referring Provider  Ramachandran      Encounter Date: 02/16/2018  Check In: Session Check In - 02/16/18 0956      Check-In   Location  [facility]    Staff Present  Nada Maclachlan, BA, ACSM CEP, Exercise Physiologist;Dally Oshel Darrin Nipper, [place], RCEP, [place], Exercise Physiologist    Supervising physician immediately available to respond to emergencies  [facility] immediately available ER MD    Physician(s)  Dr. Alfred Levins and Burlene Arnt    Medication changes reported      No    Fall or balance concerns reported     No    Tobacco Cessation  No Change    Warm-up and Cool-down  Performed as group-led instruction    Resistance Training Performed  Yes    VAD Patient?  No      Pain Assessment   Currently in Pain?  No/denies          Social History   Tobacco Use  Smoking Status Former Smoker  . Packs/day: 1.50  . Years: 50.00  . Pack years: 75.00  . Types: Cigarettes  . Last attempt to quit: 09/29/2008  . Years since quitting: 9.3  Smokeless Tobacco Never Used    Goals Met:  Independence with exercise equipment Exercise tolerated well No report of cardiac concerns or symptoms Strength training completed today  Goals Unmet:  Not Applicable  Comments: Pt able to follow exercise prescription today without complaint.  Will continue to monitor for progression.   Dr. Emily Filbert is Medical Director for [place] and [facility].

## 2018-02-18 DIAGNOSIS — J449 Chronic obstructive pulmonary disease, unspecified: Secondary | ICD-10-CM

## 2018-02-18 DIAGNOSIS — J849 Interstitial pulmonary disease, unspecified: Secondary | ICD-10-CM | POA: Diagnosis not present

## 2018-02-18 NOTE — Progress Notes (Signed)
Daily Session Note  Patient Details  Name: Billy Wise MRN: [id number] Date of Birth: Apr 06, 1946 Referring Provider:     Pulmonary Rehab from 11/30/2017 in [facility] Cardiac and Pulmonary Rehab  Referring Provider  Ramachandran      Encounter Date: 02/18/2018  Check In: Session Check In - 02/18/18 1005      Check-In   Location  [facility]    Staff Present  Justin Mend RCP,RRT,BSRT;Amanda Oletta Darter, [place], ACSM CEP, Exercise Physiologist;Meredith Sherryll Burger, RN BSN    Supervising physician immediately available to respond to emergencies  [facility] immediately available ER MD    Physician(s)  Dr. Alfred Levins and [facility]    Medication changes reported      No    Fall or balance concerns reported     No    Tobacco Cessation  No Change    Warm-up and Cool-down  Performed as group-led instruction    Resistance Training Performed  Yes    VAD Patient?  No      Pain Assessment   Currently in Pain?  No/denies          Social History   Tobacco Use  Smoking Status Former Smoker  . Packs/day: 1.50  . Years: 50.00  . Pack years: 75.00  . Types: Cigarettes  . Last attempt to quit: 09/29/2008  . Years since quitting: 9.3  Smokeless Tobacco Never Used    Goals Met:  Independence with exercise equipment Exercise tolerated well No report of cardiac concerns or symptoms Strength training completed today  Goals Unmet:  Not Applicable  Comments: Pt able to follow exercise prescription today without complaint.  Will continue to monitor for progression.   Dr. Emily Filbert is Medical Director for [place] and [facility].

## 2018-02-21 DIAGNOSIS — J449 Chronic obstructive pulmonary disease, unspecified: Secondary | ICD-10-CM

## 2018-02-21 DIAGNOSIS — J849 Interstitial pulmonary disease, unspecified: Secondary | ICD-10-CM | POA: Diagnosis not present

## 2018-02-21 NOTE — Progress Notes (Signed)
Daily Session Note  Patient Details  Name: Billy Wise MRN: [id number] Date of Birth: 08-Mar-1946 Referring Provider:     Pulmonary Rehab from 11/30/2017 in [facility]  Referring Provider  Ramachandran      Encounter Date: 02/21/2018  Check In: Session Check In - 02/21/18 1015      Check-In   Location  [facility]-Cardiac & Pulmonary Rehab    Staff Present  Nada Maclachlan, BA, ACSM CEP, Exercise Physiologist;Joseph Tedd Sias, [place], ACSM CEP, Exercise Physiologist    Supervising physician immediately available to respond to emergencies  [facility] immediately available ER MD    Physician(s)  Drs. Paduchowski and Rifenbark    Medication changes reported      No    Fall or balance concerns reported     No    Tobacco Cessation  No Change    Warm-up and Cool-down  Performed as group-led instruction    Resistance Training Performed  Yes    VAD Patient?  No      Pain Assessment   Currently in Pain?  No/denies    Multiple Pain Sites  No          Social History   Tobacco Use  Smoking Status Former Smoker  . Packs/day: 1.50  . Years: 50.00  . Pack years: 75.00  . Types: Cigarettes  . Last attempt to quit: 09/29/2008  . Years since quitting: 9.4  Smokeless Tobacco Never Used    Goals Met:  Proper associated with RPD/PD & O2 Sat Independence with exercise equipment Exercise tolerated well No report of cardiac concerns or symptoms Strength training completed today  Goals Unmet:  Not Applicable  Comments: Pt able to follow exercise prescription today without complaint.  Will continue to monitor for progression.    Dr. Emily Filbert is Medical Director for [facility] and [facility].

## 2018-02-21 NOTE — Progress Notes (Signed)
Pulmonary Individual Treatment Plan  Patient Details  Name: Billy Wise MRN: [id number] Date of Birth: 06-Aug-1946 Referring Provider:     Pulmonary Rehab from 11/30/2017 in [facility]  Referring Provider  Ramachandran      Initial Encounter Date:    Pulmonary Rehab from 11/30/2017 in [facility] Cardiac and Pulmonary Rehab  Date  11/30/17  Referring Provider  Ashby Dawes      Visit Diagnosis: COPD with chronic bronchitis and emphysema ([place])  Patient's Home Medications on Admission:  Current Outpatient Medications:  .  acidophilus (RISAQUAD) CAPS capsule, Take 1 capsule by mouth daily., Disp: , Rfl:  .  amLODipine (NORVASC) 10 MG tablet, Take 10 mg by mouth daily., Disp: , Rfl: 0 .  aspirin EC 81 MG tablet, Take 81 mg by mouth daily., Disp: , Rfl:  .  BYETTA 10 MCG PEN 10 MCG/0.04ML SOPN injection, Inject 10 mcg into the skin 2 (two) times daily with a meal. , Disp: , Rfl: 0 .  finasteride (PROSCAR) 5 MG tablet, Take 5 mg by mouth daily., Disp: , Rfl: 0 .  glimepiride (AMARYL) 4 MG tablet, Take 4 mg by mouth daily with breakfast. , Disp: , Rfl: 0 .  guaiFENesin-codeine 100-10 MG/5ML syrup, Take 15 mLs by mouth every 4 (four) hours as needed for cough., Disp: 120 mL, Rfl: 0 .  ipratropium-albuterol (DUONEB) 0.5-2.5 (3) MG/3ML SOLN, Take 3 mLs by nebulization every 6 (six) hours., Disp: 360 mL, Rfl: 2 .  LEVEMIR FLEXTOUCH 100 UNIT/ML Pen, Inject 77 Units into the skin at bedtime. , Disp: , Rfl: 0 .  metFORMIN (GLUCOPHAGE) 1000 MG tablet, Take 1 tablet by mouth 2 (two) times daily., Disp: , Rfl: 0 .  naproxen (NAPROSYN) 500 MG tablet, Take 500 mg by mouth 2 (two) times daily with a meal., Disp: , Rfl:  .  omeprazole (PRILOSEC) 20 MG capsule, Take 20 mg by mouth daily., Disp: , Rfl: 0 .  oxybutynin (DITROPAN-XL) 10 MG 24 hr tablet, Take 10 mg by mouth daily., Disp: , Rfl: 0 .  polycarbophil (FIBERCON) 625 MG tablet, Take 625 mg by mouth every evening., Disp: , Rfl:   .  PROAIR HFA 108 (90 Base) MCG/ACT inhaler, Take 2 puffs by mouth every 4 (four) hours as needed for wheezing. , Disp: , Rfl: 0 .  quinapril (ACCUPRIL) 40 MG tablet, Take 40 mg by mouth daily., Disp: , Rfl: 0 .  simvastatin (ZOCOR) 20 MG tablet, Take 20 mg by mouth every evening., Disp: , Rfl: 0 .  SPIRIVA HANDIHALER 18 MCG inhalation capsule, Take 18 mcg by mouth daily. , Disp: , Rfl: 0 .  SYMBICORT 80-4.5 MCG/ACT inhaler, Take 2 puffs by mouth 2 (two) times daily., Disp: , Rfl: 0 .  tamsulosin (FLOMAX) 0.4 MG CAPS capsule, Take 1 capsule by mouth at bedtime. , Disp: , Rfl: 0 .  traZODone (DESYREL) 50 MG tablet, Take 50 mg by mouth at bedtime., Disp: , Rfl: 0  Past Medical History: Past Medical History:  Diagnosis Date  . Actinic keratoses   . BPH (benign prostatic hyperplasia)   . CKD stage 1 due to type 2 diabetes mellitus ([place])   . COPD (chronic obstructive pulmonary disease) ([place])   . Diabetes mellitus without complication ([place])   . GERD (gastroesophageal reflux disease)   . Hypertension   . Insomnia   . Iron deficiency anemia   . Mixed hyperlipidemia   . OSA (obstructive sleep apnea)     Tobacco  Use: Social History   Tobacco Use  Smoking Status Former Smoker  . Packs/day: 1.50  . Years: 50.00  . Pack years: 75.00  . Types: Cigarettes  . Last attempt to quit: 09/29/2008  . Years since quitting: 9.4  Smokeless Tobacco Never Used    Labs: Recent Review Scientist, physiological    Labs for ITP Cardiac and Pulmonary Rehab Latest Ref Rng & Units 10/29/2017 10/29/2017   Hemoglobin A1c 4.8 - 5.6 % 7.2(H) 7.2(H)       Pulmonary Assessment Scores: Pulmonary Assessment Scores    Row Name 11/30/17 1147         ADL UCSD   ADL Phase  Entry     SOB Score total  79     Rest  1     Walk  1     Stairs  5     Bath  2     Dress  3     Shop  4       CAT Score   CAT Score  26       mMRC Score   mMRC Score  1        Pulmonary Function Assessment: Pulmonary Function  Assessment - 11/30/17 1038      Pulmonary Function Tests   FVC%  88 % Date performed 11/30/17 [facility]    FEV1%  48 %    FEV1/FVC Ratio  43      Breath   Bilateral Breath Sounds  Clear    Shortness of Breath  Yes;Fear of Shortness of Breath;Limiting activity;Panic with Shortness of Breath       Exercise Target Goals:    Exercise Program Goal: Individual exercise prescription set using results from initial 6 min walk test and THRR while considering  patient's activity barriers and safety.    Exercise Prescription Goal: Initial exercise prescription builds to 30-45 minutes a day of aerobic activity, 2-3 days per week.  Home exercise guidelines will be given to patient during program as part of exercise prescription that the participant will acknowledge.  Activity Barriers & Risk Stratification:   6 Minute Walk: 6 Minute Walk    Row Name 11/30/17 1141         6 Minute Walk   Phase  Initial     Distance  1350 feet     Walk Time  6 minutes     # of Rest Breaks  0     MPH  2.55     METS  3.09     RPE  11     Perceived Dyspnea   2     VO2 Peak  10.84     Symptoms  No     Resting HR  96 bpm     Resting BP  100/56     Resting Oxygen Saturation   90 %     Exercise Oxygen Saturation  during 6 min walk  85 %     Max Ex. HR  126 bpm     Max Ex. BP  126/54     2 Minute Post BP  110/58       Interval HR   1 Minute HR  113     2 Minute HR  115     3 Minute HR  117     4 Minute HR  117     5 Minute HR  117     6 Minute HR  126  2 Minute Post HR  97     Interval Heart Rate?  Yes       Interval Oxygen   Interval Oxygen?  Yes     Baseline Oxygen Saturation %  90 %     1 Minute Oxygen Saturation %  87 %     1 Minute Liters of Oxygen  0 L     2 Minute Oxygen Saturation %  85 %     2 Minute Liters of Oxygen  0 L     3 Minute Oxygen Saturation %  85 %     3 Minute Liters of Oxygen  0 L     4 Minute Oxygen Saturation %  86 %     4 Minute Liters of Oxygen  0 L     5 Minute  Oxygen Saturation %  87 %     5 Minute Liters of Oxygen  0 L     6 Minute Oxygen Saturation %  86 %     6 Minute Liters of Oxygen  0 L     2 Minute Post Oxygen Saturation %  93 %     2 Minute Post Liters of Oxygen  0 L       Oxygen Initial Assessment: Oxygen Initial Assessment - 11/30/17 1046      Home Oxygen   Home Oxygen Device  Home Concentrator;E-Tanks    Sleep Oxygen Prescription  Continuous    Liters per minute  2    Home Exercise Oxygen Prescription  None    Home at Rest Exercise Oxygen Prescription  None    Compliance with Home Oxygen Use  Yes      Initial 6 min Walk   Oxygen Used  None      Program Oxygen Prescription   Program Oxygen Prescription  None      Intervention   Short Term Goals  To learn and exhibit compliance with exercise, home and travel O2 prescription;To learn and understand importance of maintaining oxygen saturations>88%;To learn and demonstrate proper use of respiratory medications;To learn and understand importance of monitoring SPO2 with pulse oximeter and demonstrate accurate use of the pulse oximeter.;To learn and demonstrate proper pursed lip breathing techniques or other breathing techniques.    Long  Term Goals  Exhibits compliance with exercise, home and travel O2 prescription;Verbalizes importance of monitoring SPO2 with pulse oximeter and return demonstration;Maintenance of O2 saturations>88%;Exhibits proper breathing techniques, such as pursed lip breathing or other method taught during program session;Compliance with respiratory medication;Demonstrates proper use of MDI's       Oxygen Re-Evaluation: Oxygen Re-Evaluation    Row Name 12/10/17 1039 01/12/18 1048 02/04/18 1114         Program Oxygen Prescription   Program Oxygen Prescription  None  None  None       Home Oxygen   Home Oxygen Device  Home Concentrator;E-Tanks  Home Concentrator;E-Tanks  Home Concentrator;E-Tanks     Sleep Oxygen Prescription  Continuous  Continuous   Continuous     Liters per minute  _0 Home Exercise Oxygen Prescription  None  None  None     Home at Rest Exercise Oxygen Prescription  None  None  None     Compliance with Home Oxygen Use  Yes  Yes  Yes       Goals/Expected Outcomes   Short Term Goals  To learn and exhibit compliance with exercise, home and travel  O2 prescription;To learn and understand importance of maintaining oxygen saturations>88%;To learn and demonstrate proper use of respiratory medications;To learn and understand importance of monitoring SPO2 with pulse oximeter and demonstrate accurate use of the pulse oximeter.;To learn and demonstrate proper pursed lip breathing techniques or other breathing techniques.  To learn and exhibit compliance with exercise, home and travel O2 prescription;To learn and understand importance of maintaining oxygen saturations>88%;To learn and demonstrate proper use of respiratory medications;To learn and understand importance of monitoring SPO2 with pulse oximeter and demonstrate accurate use of the pulse oximeter.;To learn and demonstrate proper pursed lip breathing techniques or other breathing techniques.  To learn and exhibit compliance with exercise, home and travel O2 prescription;To learn and understand importance of maintaining oxygen saturations>88%;To learn and demonstrate proper use of respiratory medications;To learn and understand importance of monitoring SPO2 with pulse oximeter and demonstrate accurate use of the pulse oximeter.;To learn and demonstrate proper pursed lip breathing techniques or other breathing techniques.     Long  Term Goals  Exhibits compliance with exercise, home and travel O2 prescription;Verbalizes importance of monitoring SPO2 with pulse oximeter and return demonstration;Maintenance of O2 saturations>88%;Exhibits proper breathing techniques, such as pursed lip breathing or other method taught during program session;Compliance with respiratory  medication;Demonstrates proper use of MDI's  Exhibits compliance with exercise, home and travel O2 prescription;Verbalizes importance of monitoring SPO2 with pulse oximeter and return demonstration;Maintenance of O2 saturations>88%;Exhibits proper breathing techniques, such as pursed lip breathing or other method taught during program session;Compliance with respiratory medication;Demonstrates proper use of MDI's  Exhibits compliance with exercise, home and travel O2 prescription;Verbalizes importance of monitoring SPO2 with pulse oximeter and return demonstration;Maintenance of O2 saturations>88%;Exhibits proper breathing techniques, such as pursed lip breathing or other method taught during program session;Compliance with respiratory medication;Demonstrates proper use of MDI's     Comments  Reviewed PLB technique with pt.  Talked about how it work and it's important to maintaining his exercise saturations.    Billy Wise feels like his breathing has got alot better since the start of the program. He takes his albuterol treatments at least once a day. He practices with his incentive spirometer at times.  He uses a spacer for his inhalers and uses them independently.  He has an appointment with Dr. Ashby Dawes on Monday to review his CT Scans and PFTs from this past week.  He is hoping that he is not progressing too quickly.  He has been using his medications correctly and feels that they are working well for him,        Goals/Expected Outcomes  Short: Become more profiecient at using PLB.   Long: Become independent at using PLB.  Short: continue to increase his exercise load. Long: independently increase walking speed as tolerated keeping oxygen above 88 percent  Short: Meet with doctor.  Long: Continue to manage his COPD independently.         Oxygen Discharge (Final Oxygen Re-Evaluation): Oxygen Re-Evaluation - 02/04/18 1114      Program Oxygen Prescription   Program Oxygen Prescription  None      Home  Oxygen   Home Oxygen Device  Home Concentrator;E-Tanks    Sleep Oxygen Prescription  Continuous    Liters per minute  2    Home Exercise Oxygen Prescription  None    Home at Rest Exercise Oxygen Prescription  None    Compliance with Home Oxygen Use  Yes      Goals/Expected Outcomes   Short Term Goals  To learn  and exhibit compliance with exercise, home and travel O2 prescription;To learn and understand importance of maintaining oxygen saturations>88%;To learn and demonstrate proper use of respiratory medications;To learn and understand importance of monitoring SPO2 with pulse oximeter and demonstrate accurate use of the pulse oximeter.;To learn and demonstrate proper pursed lip breathing techniques or other breathing techniques.    Long  Term Goals  Exhibits compliance with exercise, home and travel O2 prescription;Verbalizes importance of monitoring SPO2 with pulse oximeter and return demonstration;Maintenance of O2 saturations>88%;Exhibits proper breathing techniques, such as pursed lip breathing or other method taught during program session;Compliance with respiratory medication;Demonstrates proper use of MDI's    Comments  He has an appointment with Dr. Ashby Dawes on Monday to review his CT Scans and PFTs from this past week.  He is hoping that he is not progressing too quickly.  He has been using his medications correctly and feels that they are working well for him,       Goals/Expected Outcomes  Short: Meet with doctor.  Long: Continue to manage his COPD independently.        Initial Exercise Prescription: Initial Exercise Prescription - 11/30/17 1100      Date of Initial Exercise RX and Referring Provider   Date  11/30/17    Referring Provider  Ashby Dawes      Treadmill   MPH  2.5    Grade  0.5    Minutes  15    METs  3      Recumbant Bike   Level  3    RPM  60    Watts  28    Minutes  15    METs  3      Arm Ergometer   Level  2    Watts  44    RPM  40    Minutes   15    METs  3      Recumbant Elliptical   Level  2    RPM  60    Minutes  15    METs  3      REL-XR   Level  2    Speed  50    Minutes  15    METs  3      Prescription Details   Frequency (times per week)  3    Duration  Progress to 45 minutes of aerobic exercise without signs/symptoms of physical distress      Intensity   THRR 40-80% of Max Heartrate  118-140    Ratings of Perceived Exertion  11-15    Perceived Dyspnea  0-4      Resistance Training   Training Prescription  Yes    Weight  3 lb    Reps  10-15       Perform Capillary Blood Glucose checks as needed.  Exercise Prescription Changes: Exercise Prescription Changes    Row Name 12/15/17 1200 12/24/17 1000 12/29/17 1200 01/12/18 1200 01/26/18 1200     Response to Exercise   Blood Pressure (Admit)  132/64  -  118/60  124/64  120/70   Blood Pressure (Exercise)  134/66  -  -  -  -   Blood Pressure (Exit)  116/58  -  124/70  136/60  106/72   Heart Rate (Admit)  111 bpm  -  93 bpm  108 bpm  96 bpm   Heart Rate (Exercise)  123 bpm  -  122 bpm  118 bpm  132 bpm  Heart Rate (Exit)  100 bpm  -  104 bpm  98 bpm  114 bpm   Oxygen Saturation (Admit)  90 %  -  91 %  87 %  92 %   Oxygen Saturation (Exercise)  90 %  -  88 %  90 %  88 %   Oxygen Saturation (Exit)  89 %  -  91 %  92 %  89 %   Rating of Perceived Exertion (Exercise)  12  -  _0 Perceived Dyspnea (Exercise)  1  -  1  1  0   Symptoms  none  -  none  noone  none   Duration  Continue with 45 min of aerobic exercise without signs/symptoms of physical distress.  -  Continue with 45 min of aerobic exercise without signs/symptoms of physical distress.  Continue with 45 min of aerobic exercise without signs/symptoms of physical distress.  Continue with 45 min of aerobic exercise without signs/symptoms of physical distress.   Intensity  THRR unchanged  -  THRR unchanged  THRR unchanged  THRR unchanged     Progression   Progression  Continue to progress  workloads to maintain intensity without signs/symptoms of physical distress.  Continue to progress workloads to maintain intensity without signs/symptoms of physical distress.  Continue to progress workloads to maintain intensity without signs/symptoms of physical distress.  Continue to progress workloads to maintain intensity without signs/symptoms of physical distress.  Continue to progress workloads to maintain intensity without signs/symptoms of physical distress.   Average METs  2.9  2.9  3.3  2.7  3.67     Resistance Training   Training Prescription  Yes  Yes  Yes  Yes  Yes   Weight  3 lb  3 lb  3 lb  7 lb  7 lb   Reps  10-15  10-15  10-15  10-15  10-15     Interval Training   Interval Training  No  No  No  No  No     Treadmill   MPH  2.5  2.5  2.5  -  3   Grade  _1 -  3   Minutes  _2 -  15   METs  3.6  3.6  3.6  -  4.54     Recumbant Bike   Level  -  -  3  4  -   RPM  -  -  70  75  -   Watts  -  -  27  32  -   Minutes  -  -  15  15  -   METs  -  -  3  3  -     T5 Nustep   Level  2  2  -  4  4   SPM  80  80  -  87  79   Minutes  15  15  -  15  15   METs  2.2  2.2  -  2.4  2.8     Home Exercise Plan   Plans to continue exercise at  -  [facility]   Frequency  -  Add 1 additional day to program exercise sessions. walking at home  -  -  Add 1 additional day to program exercise sessions. walking at home   Initial  Home Exercises Provided  -  12/24/17  -  -  12/24/17   Row Name 02/09/18 1400             Response to Exercise   Blood Pressure (Admit)  124/64       Blood Pressure (Exit)  134/72       Heart Rate (Admit)  99 bpm       Heart Rate (Exercise)  136 bpm       Heart Rate (Exit)  107 bpm       Oxygen Saturation (Admit)  91 %       Oxygen Saturation (Exercise)  89 %       Oxygen Saturation (Exit)  91 %       Rating of Perceived Exertion (Exercise)  12       Perceived Dyspnea (Exercise)  0       Symptoms  none       Duration   Continue with 45 min of aerobic exercise without signs/symptoms of physical distress.       Intensity  THRR unchanged         Progression   Progression  Continue to progress workloads to maintain intensity without signs/symptoms of physical distress.       Average METs  3.67         Resistance Training   Training Prescription  Yes       Weight  7 lb       Reps  10-15         Interval Training   Interval Training  No         Treadmill   MPH  3       Grade  3       Minutes  15       METs  4.54         T5 Nustep   Level  5       SPM  81       Minutes  15       METs  2.8         Home Exercise Plan   Plans to continue exercise at  [employer]       Frequency  Add 1 additional day to program exercise sessions. walking at home       Initial Home Exercises Provided  12/24/17          Exercise Comments: Exercise Comments    Row Name 12/10/17 1038 12/24/17 1109         Exercise Comments   First full day of exercise!  Patient was oriented to gym and equipment including functions, settings, policies, and procedures.  Patient's individual exercise prescription and treatment plan were reviewed.  All starting workloads were established based on the results of the 6 minute walk test done at initial orientation visit.  The plan for exercise progression was also introduced and progression will be customized based on patient's performance and goals  Home exercise reviewed with patient. Handout given and signed. Reviewed target heart rate, RPE, dyspnea scale, and safety. Patient verbalized understanding. He will add an extra day of exercise at home. He is planning on attending [facility]         Exercise Goals and Review: Exercise Goals    Row Name 11/30/17 1158             Exercise Goals   Increase Physical Activity  Yes  Intervention  Provide advice, education, support and counseling about physical activity/exercise needs.;Develop an individualized exercise prescription for  aerobic and resistive training based on initial evaluation findings, risk stratification, comorbidities and participant's personal goals.       Expected Outcomes  Short Term: Attend rehab on a regular basis to increase amount of physical activity.;Long Term: Add in home exercise to make exercise part of routine and to increase amount of physical activity.;Long Term: Exercising regularly at least 3-5 days a week.       Increase Strength and Stamina  Yes       Intervention  Provide advice, education, support and counseling about physical activity/exercise needs.;Develop an individualized exercise prescription for aerobic and resistive training based on initial evaluation findings, risk stratification, comorbidities and participant's personal goals.       Expected Outcomes  Short Term: Increase workloads from initial exercise prescription for resistance, speed, and METs.;Short Term: Perform resistance training exercises routinely during rehab and add in resistance training at home;Long Term: Improve cardiorespiratory fitness, muscular endurance and strength as measured by increased METs and functional capacity (6MWT)       Able to understand and use rate of perceived exertion (RPE) scale  Yes       Intervention  Provide education and explanation on how to use RPE scale       Expected Outcomes  Short Term: Able to use RPE daily in rehab to express subjective intensity level;Long Term:  Able to use RPE to guide intensity level when exercising independently       Able to understand and use Dyspnea scale  Yes       Intervention  Provide education and explanation on how to use Dyspnea scale       Expected Outcomes  Short Term: Able to use Dyspnea scale daily in rehab to express subjective sense of shortness of breath during exertion;Long Term: Able to use Dyspnea scale to guide intensity level when exercising independently       Knowledge and understanding of Target Heart Rate Range (THRR)  Yes        Intervention  Provide education and explanation of THRR including how the numbers were predicted and where they are located for reference       Expected Outcomes  Short Term: Able to state/look up THRR;Long Term: Able to use THRR to govern intensity when exercising independently;Short Term: Able to use daily as guideline for intensity in rehab       Able to check pulse independently  Yes       Intervention  Provide education and demonstration on how to check pulse in carotid and radial arteries.;Review the importance of being able to check your own pulse for safety during independent exercise       Expected Outcomes  Short Term: Able to explain why pulse checking is important during independent exercise;Long Term: Able to check pulse independently and accurately       Understanding of Exercise Prescription  Yes       Intervention  Provide education, explanation, and written materials on patient's individual exercise prescription       Expected Outcomes  Short Term: Able to explain program exercise prescription;Long Term: Able to explain home exercise prescription to exercise independently          Exercise Goals Re-Evaluation : Exercise Goals Re-Evaluation    Row Name 12/10/17 1038 12/15/17 1241 12/24/17 1056 12/29/17 1302 01/12/18 1249     Exercise Goal Re-Evaluation   Exercise Goals  Review  Understanding of Exercise Prescription;Able to understand and use Dyspnea scale;Knowledge and understanding of Target Heart Rate Range (THRR);Able to understand and use rate of perceived exertion (RPE) scale  Increase Physical Activity;Increase Strength and Stamina;Able to understand and use Dyspnea scale;Able to understand and use rate of perceived exertion (RPE) scale  Increase Physical Activity;Increase Strength and Stamina;Able to understand and use Dyspnea scale;Able to understand and use rate of perceived exertion (RPE) scale;Understanding of Exercise Prescription;Knowledge and understanding of Target  Heart Rate Range (THRR);Able to check pulse independently  Increase Physical Activity;Increase Strength and Stamina;Able to understand and use Dyspnea scale;Able to understand and use rate of perceived exertion (RPE) scale  Increase Physical Activity;Able to understand and use rate of perceived exertion (RPE) scale;Increase Strength and Stamina;Able to understand and use Dyspnea scale;Knowledge and understanding of Target Heart Rate Range (THRR);Understanding of Exercise Prescription   Comments  Reviewed RPE scale, THR and program prescription with pt today.  Pt voiced understanding and was given a copy of goals to take home.   B[place] has tolerated exercise well so far.  He is working at a higher incline on [employer] exercise done with patient. Handout given and signed.  Pt is progressing well - average MET level is up to 3.3  Staff will continue to monitor  [place] is progressing well and has moved up his DB weight and intensity on . Staff will continute to monitor   Expected Outcomes  Short: Use RPE daily to regulate intensity.  Long: Follow program prescription in THR.  Short - Hoke will continue to attend regularly Long - Tafari will improve overall MET level  Short: add one extra day of exercise outside of the program. Long: become indpendent with exercise after graduation  Short - Pt will attend regularly Long - Pt will continue to increase MET level  Short - Billy Wise will attend regularly and exercise at home  Billy Wise will maintain gains on his own   Billy Wise Name 01/26/18 1248 02/04/18 1059 02/09/18 1458         Exercise Goal Re-Evaluation   Exercise Goals Review  Increase Physical Activity;Able to understand and use rate of perceived exertion (RPE) scale;Knowledge and understanding of Target Heart Rate Range (THRR);Able to understand and use Dyspnea scale;Increase Strength and Stamina  Increase Physical Activity;Understanding of Exercise Prescription;Increase Strength and Stamina  Increase Physical  Activity;Able to understand and use rate of perceived exertion (RPE) scale;Knowledge and understanding of Target Heart Rate Range (THRR);Increase Strength and Stamina;Able to understand and use Dyspnea scale     Comments  Billy Wise has increased overall MET level.  Staff will monitor progress and suggest interval training  Billy Wise has been doing well in rehab. He is doing his home exercise by doing the treadmill and weights at home.  He did 16mn on the treadmill yesterday!!  He has been feeling stronger and has more stamina.   Billy Landryis maintaining his current MET level of 3.76.  His O2 levels are staying at 89-90 during exercise so no further increases would be appropriate at this time.       Expected Outcomes  Short - Billy Wise attend regularly Long - Billy Wise exercise on his own  Short: Continue to use treadmill on off days.  Long: Continue to increase strength and stamina.   Short - Billy Wise continue to attend class Long - Billy Wise maintain fitness once he graduates Billy Wise       Discharge Exercise Prescription (Final  Exercise Prescription Changes): Exercise Prescription Changes - 02/09/18 1400      Response to Exercise   Blood Pressure (Admit)  124/64    Blood Pressure (Exit)  134/72    Heart Rate (Admit)  99 bpm    Heart Rate (Exercise)  136 bpm    Heart Rate (Exit)  107 bpm    Oxygen Saturation (Admit)  91 %    Oxygen Saturation (Exercise)  89 %    Oxygen Saturation (Exit)  91 %    Rating of Perceived Exertion (Exercise)  12    Perceived Dyspnea (Exercise)  0    Symptoms  none    Duration  Continue with 45 min of aerobic exercise without signs/symptoms of physical distress.    Intensity  THRR unchanged      Progression   Progression  Continue to progress workloads to maintain intensity without signs/symptoms of physical distress.    Average METs  3.67      Resistance Training   Training Prescription  Yes    Weight  7 lb    Reps  10-15      Interval Training   Interval Training   No      Treadmill   MPH  3    Grade  3    Minutes  15    METs  4.54      T5 Nustep   Level  5    SPM  81    Minutes  15    METs  2.8      Home Exercise Plan   Plans to continue exercise at  [employer]    Frequency  Add 1 additional day to program exercise sessions. walking at home    Initial Home Exercises Provided  12/24/17       Nutrition:  Target Goals: Understanding of nutrition guidelines, daily intake of sodium '1500mg'$ , cholesterol '200mg'$ , calories 30% from fat and 7% or less from saturated fats, daily to have 5 or more servings of fruits and vegetables.  Biometrics: Pre Biometrics - 11/30/17 1144      Pre Biometrics   Height  5' 5.25" (1.657 m)    Weight  179 lb 6.4 oz (81.4 kg)    Waist Circumference  41.5 inches    Hip Circumference  42 inches    Waist to Hip Ratio  0.99 %    BMI (Calculated)  29.64        Nutrition Therapy Plan and Nutrition Goals: Nutrition Therapy & Goals - 11/30/17 1037      Personal Nutrition Goals   Comments  eat healthier and lose some wieght.      Intervention Plan   Intervention  Prescribe, educate and counsel regarding individualized specific dietary modifications aiming towards targeted core components such as weight, hypertension, lipid management, diabetes, heart failure and other comorbidities.;Nutrition handout(s) given to patient.    Expected Outcomes  Short Term Goal: Understand basic principles of dietary content, such as calories, fat, sodium, cholesterol and nutrients.;Short Term Goal: A plan has been developed with personal nutrition goals set during dietitian appointment.;Long Term Goal: Adherence to prescribed nutrition plan.       Nutrition Assessments: Nutrition Assessments - 11/30/17 1035      MEDFICTS Scores   Pre Score  108       Nutrition Goals Re-Evaluation: Nutrition Goals Re-Evaluation    Row Name 12/15/17 1028 01/12/18 1054 02/04/18 1109         Goals   Current Weight  176 lb (79.8 kg)  178 lb  (80.7 kg)  181 lb (82.1 kg)     Nutrition Goal  Lose some weight. Eat as healthy as he can.  Still lose weight  Still lose weight  Meet with nutritionist.      Comment  He would like to lose some more weight to help him breath. He does not want to meet with the dietician since he has seen a dietician for his diabetes.  Some days he is eating what he wants. He says he will eat a hamburger, steak or fries at least once a week. He says sometimes he gets really full and sometimes he doesn't.   He has been induling in some ice cream.  He has not meet with nutritionist and has appointmnent scheduled for 4/29 during class.   He is eating, but could use some tweaks.      Expected Outcome  Short: lose 5 more pounds. Long: maintain weight Loss.  Short: lose 5 more pounds. Long: maintain weight Loss.  Short: Meet with dietician.  Long: Continue to work on weight loss.         Nutrition Goals Discharge (Final Nutrition Goals Re-Evaluation): Nutrition Goals Re-Evaluation - 02/04/18 1109      Goals   Current Weight  181 lb (82.1 kg)    Nutrition Goal  Still lose weight  Meet with nutritionist.     Comment  He has been induling in some ice cream.  He has not meet with nutritionist and has appointmnent scheduled for 4/29 during class.   He is eating, but could use some tweaks.     Expected Outcome  Short: Meet with dietician.  Long: Continue to work on weight loss.        Psychosocial: Target Goals: Acknowledge presence or absence of significant depression and/or stress, maximize coping skills, provide positive support system. Participant is able to verbalize types and ability to use techniques and skills needed for reducing stress and depression.   Initial Review & Psychosocial Screening: Initial Psych Review & Screening - 11/30/17 1032      Initial Review   Current issues with  Current Sleep Concerns;Current Stress Concerns    Source of Stress Concerns  Chronic Illness;Unable to perform yard/household  activities;Unable to participate in former interests or hobbies    Comments  His yard work got to be too much for him, he cant swim like he wants to. He lost 10 pounds recently and has not exercised in many years.      Family Dynamics   Good Support System?  Yes    Comments  He looks to his wife and his daughter inlaw for support      Barriers   Psychosocial barriers to participate in program  The patient should benefit from training in stress management and relaxation.      Screening Interventions   Interventions  Program counselor consult;Encouraged to exercise;Provide feedback about the scores to participant;To provide support and resources with identified psychosocial needs    Expected Outcomes  Short Term goal: Utilizing psychosocial counselor, staff and physician to assist with identification of specific Stressors or current issues interfering with healing process. Setting desired goal for each stressor or current issue identified.;Long Term goal: The participant improves quality of Life and PHQ9 Scores as seen by post scores and/or verbalization of changes;Short Term goal: Identification and review with participant of any Quality of Life or Depression concerns found by scoring the questionnaire.;Long Term Goal: Stressors or  current issues are controlled or eliminated.       Quality of Life Scores:  Scores of 19 and below usually indicate a poorer quality of life in these areas.  A difference of  2-3 points is a clinically meaningful difference.  A difference of 2-3 points in the total score of the Quality of Life Index has been associated with significant improvement in overall quality of life, self-image, physical symptoms, and general health in studies assessing change in quality of life.  PHQ-9: Recent Review Flowsheet Data    Depression screen [facility] 2/9 12/13/2017 11/30/2017   Decreased Interest 3 3   Down, Depressed, Hopeless 0 0   PHQ - 2 Score 3 3   Altered sleeping 1 3   Tired,  decreased energy 3 3   Change in appetite 0 0   Feeling bad or failure about yourself  0 0   Trouble concentrating 0 0   Moving slowly or fidgety/restless 0 0   Suicidal thoughts 0 0   PHQ-9 Score 7 9   Difficult doing work/chores Not difficult at all Not difficult at all     Interpretation of Total Score  Total Score Depression Severity:  1-4 = Minimal depression, 5-9 = Mild depression, 10-14 = Moderate depression, 15-19 = Moderately severe depression, 20-27 = Severe depression   Psychosocial Evaluation and Intervention: Psychosocial Evaluation - 12/13/17 1115      Psychosocial Evaluation & Interventions   Interventions  Stress management education;Relaxation education;Encouraged to exercise with the program and follow exercise prescription    Comments  Counselor met with Mr. Degante today for initial psychosocial evaluation.  He is a 72 year old who has COPD and was hospitalized recently for flu and pneumonia.  He has a strong support system with a spouse; several sons and their families; and active involvement in his local church.  Carrell reports his sleep has improved just recently with a change of medication back to Ambien since the Trazodone was not helpful.  He has a good appetite and denies a history of depression or anxiety - or any current symptoms.  Kaydence states he is typically in a positive mood and has minimal stress in his life - although his spouse is scheduled for surgery this Friday and he is concerned about that.  He has goals for this program to help him breathe better; to reduce the use of Oxygen; to improve his quality of breathing and to exercise consistently.  Staff will follow with [facility] throughout the course of this program.      Expected Outcomes  Payne will benefit from consistent exercise to achieve his stated goals.  The educational and psychoeducational components of this program will help him understand; manage and cope better with life in general.  Staff will  follow.     Continue Psychosocial Services   Follow up required by staff       Psychosocial Re-Evaluation: Psychosocial Re-Evaluation    Billy Wise Name 12/15/17 1031 01/12/18 1058 02/04/18 1111 02/09/18 1033       Psychosocial Re-Evaluation   Current issues with  Current Sleep Concerns;Current Stress Concerns  Current Sleep Concerns;Current Stress Concerns  Current Sleep Concerns;Current Stress Concerns  -    Comments  Billy Wise has been sleeping better. His oxygen and a wedge pillow has helped him sleep. He has a sleep number bed coming this week. Exercising in rehab has helped him sleep better.  Billy Wise states his sleeping has got a little better since last time. His  attitude is positive and says the program has helped him breath better.  Billy Wise continues to sleep better!  Exercise has helped him feel a lot better.  He remains postive.  He has an appointment with Dr. Ashby Dawes on Monday to review his CT Scans and PFTs from this past week.  He is hoping that he is not progressing too quickly.  Counselor follow up with Billy Wise today reporting feeling stronger and more energetic since coming into this program.  He continues to have some stress with his spouse's health but he is coping better with exercise.  Kinsey is sleeping better and reports he continues to need the O2 but his levels are better during the day now.  counselor commended Billy Wise for his progress made so far.    Expected Outcomes  Short: use his pillow and new bed to sleep better. Long: maintain a good sleep pattern and be compliant with equipment.  Short: continue exercise to reduce stress. Long: independently exercise to keep stress to a miminum.  Short: Meet with doctor to learn about progression of disease. Long: Continue to remain positive.   Short:  Continue to stay positive re: his spouse's health.   Long:  Continue to exercise consistently for overall health and breathing better.     Interventions  Encouraged to attend Pulmonary  Rehabilitation for the exercise  Encouraged to attend Pulmonary Rehabilitation for the exercise  Encouraged to attend Pulmonary Rehabilitation for the exercise  Stress management education;Relaxation education    Continue Psychosocial Services   Follow up required by staff  Follow up required by staff  Follow up required by staff  Follow up required by staff       Psychosocial Discharge (Final Psychosocial Re-Evaluation): Psychosocial Re-Evaluation - 02/09/18 1033      Psychosocial Re-Evaluation   Comments  Counselor follow up with Billy Wise today reporting feeling stronger and more energetic since coming into this program.  He continues to have some stress with his spouse's health but he is coping better with exercise.  Billy Wise is sleeping better and reports he continues to need the O2 but his levels are better during the day now.  counselor commended Billy Wise for his progress made so far.    Expected Outcomes  Short:  Continue to stay positive re: his spouse's health.   Long:  Continue to exercise consistently for overall health and breathing better.     Interventions  Stress management education;Relaxation education    Continue Psychosocial Services   Follow up required by staff       Education: Education Goals: Education classes will be provided on a weekly basis, covering required topics. Participant will state understanding/return demonstration of topics presented.  Learning Barriers/Preferences: Learning Barriers/Preferences - 11/30/17 1039      Learning Barriers/Preferences   Learning Barriers  Sight wears glasses    Learning Preferences  None       Education Topics:  Initial Evaluation Education: - Verbal, written and demonstration of respiratory meds, oximetry and breathing techniques. Instruction on use of nebulizers and MDIs and importance of monitoring MDI activations.   Pulmonary Rehab from 02/16/2018 in [facility] Cardiac and Pulmonary Rehab  Date  11/30/17  Educator  [facility]   Instruction Review Code  1- Verbalizes Understanding      General Nutrition Guidelines/Fats and Fiber: -Group instruction provided by verbal, written material, models and posters to present the general guidelines for heart healthy nutrition. Gives an explanation and review of dietary fats and fiber.   Pulmonary Rehab  from 02/16/2018 in [facility] Cardiac and Pulmonary Rehab  Date  01/10/18  Educator  CR  Instruction Review Code  1- Verbalizes Understanding      Controlling Sodium/Reading Food Labels: -Group verbal and written material supporting the discussion of sodium use in heart healthy nutrition. Review and explanation with models, verbal and written materials for utilization of the food label.   Pulmonary Rehab from 02/16/2018 in [facility] Cardiac and Pulmonary Rehab  Date  01/17/18  Educator  CR  Instruction Review Code  1- Verbalizes Understanding      Exercise Physiology & General Exercise Guidelines: - Group verbal and written instruction with models to review the exercise physiology of the cardiovascular system and associated critical values. Provides general exercise guidelines with specific guidelines to those with heart or lung disease.    Pulmonary Rehab from 02/16/2018 in [facility] Cardiac and Pulmonary Rehab  Date  01/26/18  Educator  [facility]  Instruction Review Code  1- Verbalizes Understanding      Aerobic Exercise & Resistance Training: - Gives group verbal and written instruction on the various components of exercise. Focuses on aerobic and resistive training programs and the benefits of this training and how to safely progress through these programs.   Flexibility, Balance, Mind/Body Relaxation: Provides group verbal/written instruction on the benefits of flexibility and balance training, including mind/body exercise modes such as yoga, pilates and tai chi.  Demonstration and skill practice provided.   Stress and Anxiety: - Provides group verbal and written instruction about the  health risks of elevated stress and causes of high stress.  Discuss the correlation between heart/lung disease and anxiety and treatment options. Review healthy ways to manage with stress and anxiety.   Pulmonary Rehab from 02/16/2018 in [facility] Cardiac and Pulmonary Rehab  Date  12/22/17  Educator  [facility]  Instruction Review Code  1- Verbalizes Understanding      Depression: - Provides group verbal and written instruction on the correlation between heart/lung disease and depressed mood, treatment options, and the stigmas associated with seeking treatment.   Exercise & Equipment Safety: - Individual verbal instruction and demonstration of equipment use and safety with use of the equipment.   Pulmonary Rehab from 02/16/2018 in [facility] Cardiac and Pulmonary Rehab  Date  11/30/17  Educator  [facility]  Instruction Review Code  1- Verbalizes Understanding      Infection Prevention: - Provides verbal and written material to individual with discussion of infection control including proper hand washing and proper equipment cleaning during exercise session.   Pulmonary Rehab from 02/16/2018 in [facility] Cardiac and Pulmonary Rehab  Date  11/30/17  Educator  [facility]  Instruction Review Code  1- Verbalizes Understanding      Falls Prevention: - Provides verbal and written material to individual with discussion of falls prevention and safety.   Pulmonary Rehab from 02/16/2018 in [facility] Cardiac and Pulmonary Rehab  Date  11/30/17  Educator  [facility]  Instruction Review Code  1- Verbalizes Understanding      Diabetes: - Individual verbal and written instruction to review signs/symptoms of diabetes, desired ranges of glucose level fasting, after meals and with exercise. Advice that pre and post exercise glucose checks will be done for 3 sessions at entry of program.   Chronic Lung Diseases: - Group verbal and written instruction to review updates, respiratory medications, advancements in procedures and treatments. Discuss use of  supplemental oxygen including available portable oxygen systems, continuous and intermittent flow rates, concentrators, personal use and safety guidelines. Review proper  use of inhaler and spacers. Provide informative websites for self-education.    Pulmonary Rehab from 02/16/2018 in [facility] Cardiac and Pulmonary Rehab  Date  01/12/18  Educator  [facility]  Instruction Review Code  1- Verbalizes Understanding      Energy Conservation: - Provide group verbal and written instruction for methods to conserve energy, plan and organize activities. Instruct on pacing techniques, use of adaptive equipment and posture/positioning to relieve shortness of breath.   Pulmonary Rehab from 02/16/2018 in [facility] Cardiac and Pulmonary Rehab  Date  12/15/17  Educator  [facility]  Instruction Review Code  1- Verbalizes Understanding      Triggers and Exacerbations: - Group verbal and written instruction to review types of environmental triggers and ways to prevent exacerbations. Discuss weather changes, air quality and the benefits of nasal washing. Review warning signs and symptoms to help prevent infections. Discuss techniques for effective airway clearance, coughing, and vibrations.   Pulmonary Rehab from 02/16/2018 in [facility] Cardiac and Pulmonary Rehab  Date  02/09/18  Educator  [facility]  Instruction Review Code  1- Verbalizes Understanding      AED/CPR: - Group verbal and written instruction with the use of models to demonstrate the basic use of the AED with the basic ABC's of resuscitation.   Pulmonary Rehab from 02/16/2018 in [facility] Cardiac and Pulmonary Rehab  Date  12/17/17  Educator  [facility]  Instruction Review Code  1- Actuary and Physiology of the Lungs: - Group verbal and written instruction with the use of models to provide basic lung anatomy and physiology related to function, structure and complications of lung disease.   Pulmonary Rehab from 02/16/2018 in [facility] Cardiac and Pulmonary Rehab  Date   12/29/17  Educator  [facility]  Instruction Review Code  1- Verbalizes Understanding      Anatomy & Physiology of the Heart: - Group verbal and written instruction and models provide basic cardiac anatomy and physiology, with the coronary electrical and arterial systems. Review of Valvular disease and Heart Failure   Pulmonary Rehab from 02/16/2018 in [facility] Cardiac and Pulmonary Rehab  Date  02/16/18  Educator  [facility]  Instruction Review Code  1- Verbalizes Understanding      Cardiac Medications: - Group verbal and written instruction to review commonly prescribed medications for heart disease. Reviews the medication, class of the drug, and side effects.   Know Your Numbers and Risk Factors: -Group verbal and written instruction about important numbers in your health.  Discussion of what are risk factors and how they play a role in the disease process.  Review of Cholesterol, Blood Pressure, Diabetes, and BMI and the role they play in your overall health.   Pulmonary Rehab from 02/16/2018 in [facility] Cardiac and Pulmonary Rehab  Date  01/28/18  Educator  [facility]  Instruction Review Code  1- Verbalizes Understanding      Sleep Hygiene: -Provides group verbal and written instruction about how sleep can affect your health.  Define sleep hygiene, discuss sleep cycles and impact of sleep habits. Review good sleep hygiene tips.    Pulmonary Rehab from 02/16/2018 in [facility] Cardiac and Pulmonary Rehab  Date  02/02/18  Educator  [facility]  Instruction Review Code  1- Verbalizes Understanding      Other: -Provides group and verbal instruction on various topics (see comments)    Knowledge Questionnaire Score: Knowledge Questionnaire Score - 11/30/17 1039      Knowledge Questionnaire Score   Pre Score  15/18 reviewed with patient        Core Components/Risk Factors/Patient Goals at Admission: Personal Goals and Risk Factors at Admission - 11/30/17 1047      Core Components/Risk Factors/Patient Goals on Admission     Weight Management  Yes;Weight Loss    Intervention  Weight Management: Develop a combined nutrition and exercise program designed to reach desired caloric intake, while maintaining appropriate intake of nutrient and fiber, sodium and fats, and appropriate energy expenditure required for the weight goal.;Weight Management: Provide education and appropriate resources to help participant work on and attain dietary goals.;Weight Management/Obesity: Establish reasonable short term and long term weight goals.    Admit Weight  179 lb 6.4 oz (81.4 kg)    Goal Weight: Short Term  174 lb (78.9 kg)    Goal Weight: Long Term  170 lb (77.1 kg)    Expected Outcomes  Short Term: Continue to assess and modify interventions until short term weight is achieved;Long Term: Adherence to nutrition and physical activity/exercise program aimed toward attainment of established weight goal;Weight Maintenance: Understanding of the daily nutrition guidelines, which includes 25-35% calories from fat, 7% or less cal from saturated fats, less than '200mg'$  cholesterol, less than 1.5gm of sodium, & 5 or more servings of fruits and vegetables daily;Weight Loss: Understanding of general recommendations for a balanced deficit meal plan, which promotes 1-2 lb weight loss per week and includes a negative energy balance of [phone] kcal/d;Understanding recommendations for meals to include 15-35% energy as protein, 25-35% energy from fat, 35-60% energy from carbohydrates, less than '200mg'$  of dietary cholesterol, 20-35 gm of total fiber daily;Understanding of distribution of calorie intake throughout the day with the consumption of 4-5 meals/snacks    Improve shortness of breath with ADL's  Yes    Intervention  Provide education, individualized exercise plan and daily activity instruction to help decrease symptoms of SOB with activities of daily living.    Expected Outcomes  Short Term: Improve cardiorespiratory fitness to achieve a reduction of  symptoms when performing ADLs;Long Term: Be able to perform more ADLs without symptoms or delay the onset of symptoms    Diabetes  Yes    Intervention  Provide education about signs/symptoms and action to take for hypo/hyperglycemia.;Provide education about proper nutrition, including hydration, and aerobic/resistive exercise prescription along with prescribed medications to achieve blood glucose in normal ranges: Fasting glucose 65-99 mg/dL    Expected Outcomes  Short Term: Participant verbalizes understanding of the signs/symptoms and immediate care of hyper/hypoglycemia, proper foot care and importance of medication, aerobic/resistive exercise and nutrition plan for blood glucose control.;Long Term: Attainment of HbA1C < 7%.    Hypertension  Yes    Intervention  Provide education on lifestyle modifcations including regular physical activity/exercise, weight management, moderate sodium restriction and increased consumption of fresh fruit, vegetables, and low fat dairy, alcohol moderation, and smoking cessation.;Monitor prescription use compliance.    Expected Outcomes  Long Term: Maintenance of blood pressure at goal levels.;Short Term: Continued assessment and intervention until BP is < 140/93m HG in hypertensive participants. < 130/824mHG in hypertensive participants with diabetes, heart failure or chronic kidney disease.    Lipids  Yes    Intervention  Provide education and support for participant on nutrition & aerobic/resistive exercise along with prescribed medications to achieve LDL '70mg'$ , HDL >'40mg'$ .    Expected Outcomes  Short Term: Participant states understanding of desired cholesterol values and is compliant with medications prescribed. Participant is following exercise prescription and nutrition guidelines.;Long  Term: Cholesterol controlled with medications as prescribed, with individualized exercise RX and with personalized nutrition plan. Value goals: LDL < 52m, HDL > 40 mg.        Core Components/Risk Factors/Patient Goals Review:  Goals and Risk Factor Review    Row Name 01/12/18 1044 02/04/18 1101           Core Components/Risk Factors/Patient Goals Review   Personal Goals Review  Weight Management/Obesity;Improve shortness of breath with ADL's;Diabetes;Hypertension;Lipids  Weight Management/Obesity;Improve shortness of breath with ADL's;Diabetes;Hypertension;Lipids      Review  SAdrionfeels like his breathing has got alot better since the start of the program. He feels alot stronger and that his breathing is easier. He checks his blood sugar 3 times a day and has no issues checking. He has a wellness appointment in June to check is lipids and other labs.  Shedric's weight was up some today at 181 lbs but he did have some ice cream last night.  He is doing a little better with his breathing. He still gets SOB when he really exerts and stamina also. He feels 500% better but still wants to keep improving.  He continues to check his blood sugars in the mornings and averages 110-125.  His blood pressures have been good in class, but he does not check them at home.  His meds are working well for him.      Expected Outcomes  Short: go to his appointment to check cholestorol. Long: keep cholestorol within normal limits.  Short: Continue to work on weight loss.  Long: Continue to improve SOB.          Core Components/Risk Factors/Patient Goals at Discharge (Final Review):  Goals and Risk Factor Review - 02/04/18 1101      Core Components/Risk Factors/Patient Goals Review   Personal Goals Review  Weight Management/Obesity;Improve shortness of breath with ADL's;Diabetes;Hypertension;Lipids    Review  Charlotte's weight was up some today at 181 lbs but he did have some ice cream last night.  He is doing a little better with his breathing. He still gets SOB when he really exerts and stamina also. He feels 500% better but still wants to keep improving.  He continues to check his  blood sugars in the mornings and averages 110-125.  His blood pressures have been good in class, but he does not check them at home.  His meds are working well for him.    Expected Outcomes  Short: Continue to work on weight loss.  Long: Continue to improve SOB.        ITP Comments: ITP Comments    Row Name 11/30/17 1138 12/27/17 0829 01/24/18 0840 02/21/18 0831     ITP Comments  Medical Evaluation completed. Chart sent for review and changes to Dr. MEmily FilbertDirector of [facility] Diagnosis can be found in [facility] encounter 11/15/17  30 day review completed. ITP sent to Dr. [place]mily FilbertDirector of [place] Continue with ITP unless changes are made by physician.   30 day review completed. ITP sent to Dr. MEmily FilbertDirector of [facility] Continue with ITP unless changes are made by physician   30 day review completed. ITP sent to Dr. MEmily FilbertDirector of [facility] Continue with ITP unless changes are made by physician       Comments: 30 day review

## 2018-02-23 DIAGNOSIS — J849 Interstitial pulmonary disease, unspecified: Secondary | ICD-10-CM | POA: Diagnosis not present

## 2018-02-23 DIAGNOSIS — J449 Chronic obstructive pulmonary disease, unspecified: Secondary | ICD-10-CM

## 2018-02-23 NOTE — Progress Notes (Signed)
Daily Session Note  Patient Details  Name: ODAS OZER MRN: [id number] Date of Birth: 1946/03/29 Referring Provider:     Pulmonary Rehab from 11/30/2017 in [facility] Cardiac and Pulmonary Rehab  Referring Provider  Ramachandran      Encounter Date: 02/23/2018  Check In: Session Check In - 02/23/18 1011      Check-In   Location  [facility]-Cardiac & Pulmonary Rehab    Staff Present  Alberteen Sam, MA, RCEP, CCRP, Exercise Physiologist;Amanda Oletta Darter, [place], ACSM CEP, Exercise Physiologist;Joseph Flavia Shipper    Supervising physician immediately available to respond to emergencies  [facility] immediately available ER MD    Physician(s)  Jimmye Norman and Burlene Arnt    Medication changes reported      No    Fall or balance concerns reported     No    Warm-up and Cool-down  Performed as group-led instruction    Resistance Training Performed  Yes    VAD Patient?  No      Pain Assessment   Currently in Pain?  No/denies    Multiple Pain Sites  No          Social History   Tobacco Use  Smoking Status Former Smoker  . Packs/day: 1.50  . Years: 50.00  . Pack years: 75.00  . Types: Cigarettes  . Last attempt to quit: 09/29/2008  . Years since quitting: 9.4  Smokeless Tobacco Never Used    Goals Met:  Proper associated with RPD/PD & O2 Sat Independence with exercise equipment Exercise tolerated well Strength training completed today  Goals Unmet:  Not Applicable  Comments: Pt able to follow exercise prescription today without complaint.  Will continue to monitor for progression.    Dr. Emily Filbert is Medical Director for [place] and [facility].

## 2018-02-28 ENCOUNTER — Encounter: Payer: Self-pay | Admitting: *Deleted

## 2018-02-28 ENCOUNTER — Telehealth: Payer: Self-pay | Admitting: *Deleted

## 2018-02-28 DIAGNOSIS — J449 Chronic obstructive pulmonary disease, unspecified: Secondary | ICD-10-CM

## 2018-02-28 NOTE — Telephone Encounter (Signed)
Billy Wise called to let us know that he will be out again today.  He has had a virus since Friday of last week that has left him feeling very weak.

## 2018-03-04 ENCOUNTER — Telehealth: Payer: Self-pay | Admitting: *Deleted

## 2018-03-04 ENCOUNTER — Encounter: Payer: Self-pay | Admitting: *Deleted

## 2018-03-04 DIAGNOSIS — J449 Chronic obstructive pulmonary disease, unspecified: Secondary | ICD-10-CM

## 2018-03-04 NOTE — Telephone Encounter (Signed)
Billy Wise is feeling better, but his mom passed away.  He will be out today.

## 2018-03-09 ENCOUNTER — Encounter: Payer: Medicare Other | Admitting: *Deleted

## 2018-03-09 DIAGNOSIS — J449 Chronic obstructive pulmonary disease, unspecified: Secondary | ICD-10-CM

## 2018-03-09 DIAGNOSIS — J849 Interstitial pulmonary disease, unspecified: Secondary | ICD-10-CM | POA: Diagnosis not present

## 2018-03-09 NOTE — Progress Notes (Signed)
Daily Session Note  Patient Details  Name: Billy Wise MRN: [id number] Date of Birth: 12/29/1945 Referring Provider:     Pulmonary Rehab from 11/30/2017 in [facility] Cardiac and Pulmonary Rehab  Referring Provider  Ramachandran      Encounter Date: 03/09/2018  Check In: Session Check In - 03/09/18 1056      Check-In   Location  [facility]-Cardiac & Pulmonary Rehab    Staff Present  Jessica Hawkins, MA, RCEP, CCRP, Exercise Physiologist;Meredith Craven, RN BSN;Amanda Sommer, BA, ACSM CEP, Exercise Physiologist    Supervising physician immediately available to respond to emergencies  [facility] immediately available ER MD    Physician(s)   Dr. Norman and Veronese     Medication changes reported      No    Fall or balance concerns reported     No    Tobacco Cessation  No Change    Warm-up and Cool-down  Performed as group-led instruction    Resistance Training Performed  Yes    VAD Patient?  No      Pain Assessment   Currently in Pain?  No/denies          Social History   Tobacco Use  Smoking Status Former Smoker  . Packs/day: 1.50  . Years: 50.00  . Pack years: 75.00  . Types: Cigarettes  . Last attempt to quit: 09/29/2008  . Years since quitting: 9.4  Smokeless Tobacco Never Used    Goals Met:  Proper associated with RPD/PD & O2 Sat Independence with exercise equipment Using PLB without cueing & demonstrates good technique Exercise tolerated well Personal goals reviewed No report of cardiac concerns or symptoms Strength training completed today  Goals Unmet:  Not Applicable  Comments: Pt able to follow exercise prescription today without complaint.  Will continue to monitor for progression. See ITP for goal review.    Dr. Mark Miller is Medical Director for [facility] and [facility]. 

## 2018-03-11 ENCOUNTER — Encounter: Payer: Medicare Other | Admitting: *Deleted

## 2018-03-11 DIAGNOSIS — J449 Chronic obstructive pulmonary disease, unspecified: Secondary | ICD-10-CM

## 2018-03-11 DIAGNOSIS — J849 Interstitial pulmonary disease, unspecified: Secondary | ICD-10-CM | POA: Diagnosis not present

## 2018-03-11 NOTE — Progress Notes (Signed)
Daily Session Note  Patient Details  Name: Billy Wise MRN: [id number] Date of Birth: 1946/03/20 Referring Provider:     Pulmonary Rehab from 11/30/2017 in [facility] Cardiac and Pulmonary Rehab  Referring Provider  Ramachandran      Encounter Date: 03/11/2018  Check In: Session Check In - 03/11/18 1025      Check-In   Location  [facility]-Cardiac & Pulmonary Rehab    Staff Present  Darel Hong, RN BSN;Nana Addai, RN Vickki Hearing, BA, ACSM CEP, Exercise Physiologist    Supervising physician immediately available to respond to emergencies  [facility] immediately available ER MD    Physician(s)  Drs. Malinda and Williams    Medication changes reported      No    Fall or balance concerns reported     No    Warm-up and Cool-down  Performed as group-led Higher education careers adviser Performed  Yes    VAD Patient?  No      Pain Assessment   Currently in Pain?  No/denies          Social History   Tobacco Use  Smoking Status Former Smoker  . Packs/day: 1.50  . Years: 50.00  . Pack years: 75.00  . Types: Cigarettes  . Last attempt to quit: 09/29/2008  . Years since quitting: 9.4  Smokeless Tobacco Never Used    Goals Met:  Proper associated with RPD/PD & O2 Sat Independence with exercise equipment Using PLB without cueing & demonstrates good technique Exercise tolerated well No report of cardiac concerns or symptoms Strength training completed today  Goals Unmet:  Not Applicable  Comments: Pt able to follow exercise prescription today without complaint.  Will continue to monitor for progression.    Dr. Emily Filbert is Medical Director for [place] and [facility].

## 2018-03-16 DIAGNOSIS — J849 Interstitial pulmonary disease, unspecified: Secondary | ICD-10-CM | POA: Diagnosis not present

## 2018-03-16 DIAGNOSIS — J449 Chronic obstructive pulmonary disease, unspecified: Secondary | ICD-10-CM

## 2018-03-16 NOTE — Progress Notes (Signed)
Daily Session Note  Patient Details  Name: Billy Wise MRN: [id number] Date of Birth: 11-20-1945 Referring Provider:     Pulmonary Rehab from 11/30/2017 in [facility] Cardiac and Pulmonary Rehab  Referring Provider  Ramachandran      Encounter Date: 03/16/2018  Check In: Session Check In - 03/16/18 1128      Check-In   Location  [facility]-Cardiac & Pulmonary Rehab    Staff Present  Justin Mend Lorre Nick, MA, RCEP, CCRP, Exercise Physiologist;Amanda Oletta Darter, [place], ACSM CEP, Exercise Physiologist    Supervising physician immediately available to respond to emergencies  [facility] immediately available ER MD    Physician(s)  Dr. Joni Fears and Quentin Cornwall    Medication changes reported      No    Fall or balance concerns reported     No    Tobacco Cessation  No Change    Warm-up and Cool-down  Performed as group-led instruction    Resistance Training Performed  Yes    VAD Patient?  No      Pain Assessment   Currently in Pain?  No/denies          Social History   Tobacco Use  Smoking Status Former Smoker  . Packs/day: 1.50  . Years: 50.00  . Pack years: 75.00  . Types: Cigarettes  . Last attempt to quit: 09/29/2008  . Years since quitting: 9.4  Smokeless Tobacco Never Used    Goals Met:  Independence with exercise equipment Exercise tolerated well No report of cardiac concerns or symptoms Strength training completed today  Goals Unmet:  Not Applicable  Comments: Pt able to follow exercise prescription today without complaint.  Will continue to monitor for progression.   Dr. Emily Filbert is Medical Director for [place] and [facility].

## 2018-03-21 ENCOUNTER — Encounter: Payer: Medicare Other | Attending: Internal Medicine

## 2018-03-21 VITALS — Ht 65.25 in | Wt 181.0 lb

## 2018-03-21 DIAGNOSIS — J449 Chronic obstructive pulmonary disease, unspecified: Secondary | ICD-10-CM | POA: Insufficient documentation

## 2018-03-21 DIAGNOSIS — J849 Interstitial pulmonary disease, unspecified: Secondary | ICD-10-CM | POA: Diagnosis not present

## 2018-03-21 NOTE — Progress Notes (Signed)
Pulmonary Individual Treatment Plan  Patient Details  Name: KOLSEN CHOE MRN: [id number] Date of Birth: 06-Aug-1946 Referring Provider:     Pulmonary Rehab from 11/30/2017 in [facility]  Referring Provider  Ramachandran      Initial Encounter Date:    Pulmonary Rehab from 11/30/2017 in [facility] Cardiac and Pulmonary Rehab  Date  11/30/17  Referring Provider  Ashby Dawes      Visit Diagnosis: COPD with chronic bronchitis and emphysema ([place])  Patient's Home Medications on Admission:  Current Outpatient Medications:  .  acidophilus (RISAQUAD) CAPS capsule, Take 1 capsule by mouth daily., Disp: , Rfl:  .  amLODipine (NORVASC) 10 MG tablet, Take 10 mg by mouth daily., Disp: , Rfl: 0 .  aspirin EC 81 MG tablet, Take 81 mg by mouth daily., Disp: , Rfl:  .  BYETTA 10 MCG PEN 10 MCG/0.04ML SOPN injection, Inject 10 mcg into the skin 2 (two) times daily with a meal. , Disp: , Rfl: 0 .  finasteride (PROSCAR) 5 MG tablet, Take 5 mg by mouth daily., Disp: , Rfl: 0 .  glimepiride (AMARYL) 4 MG tablet, Take 4 mg by mouth daily with breakfast. , Disp: , Rfl: 0 .  guaiFENesin-codeine 100-10 MG/5ML syrup, Take 15 mLs by mouth every 4 (four) hours as needed for cough., Disp: 120 mL, Rfl: 0 .  ipratropium-albuterol (DUONEB) 0.5-2.5 (3) MG/3ML SOLN, Take 3 mLs by nebulization every 6 (six) hours., Disp: 360 mL, Rfl: 2 .  LEVEMIR FLEXTOUCH 100 UNIT/ML Pen, Inject 77 Units into the skin at bedtime. , Disp: , Rfl: 0 .  metFORMIN (GLUCOPHAGE) 1000 MG tablet, Take 1 tablet by mouth 2 (two) times daily., Disp: , Rfl: 0 .  naproxen (NAPROSYN) 500 MG tablet, Take 500 mg by mouth 2 (two) times daily with a meal., Disp: , Rfl:  .  omeprazole (PRILOSEC) 20 MG capsule, Take 20 mg by mouth daily., Disp: , Rfl: 0 .  oxybutynin (DITROPAN-XL) 10 MG 24 hr tablet, Take 10 mg by mouth daily., Disp: , Rfl: 0 .  polycarbophil (FIBERCON) 625 MG tablet, Take 625 mg by mouth every evening., Disp: , Rfl:   .  PROAIR HFA 108 (90 Base) MCG/ACT inhaler, Take 2 puffs by mouth every 4 (four) hours as needed for wheezing. , Disp: , Rfl: 0 .  quinapril (ACCUPRIL) 40 MG tablet, Take 40 mg by mouth daily., Disp: , Rfl: 0 .  simvastatin (ZOCOR) 20 MG tablet, Take 20 mg by mouth every evening., Disp: , Rfl: 0 .  SPIRIVA HANDIHALER 18 MCG inhalation capsule, Take 18 mcg by mouth daily. , Disp: , Rfl: 0 .  SYMBICORT 80-4.5 MCG/ACT inhaler, Take 2 puffs by mouth 2 (two) times daily., Disp: , Rfl: 0 .  tamsulosin (FLOMAX) 0.4 MG CAPS capsule, Take 1 capsule by mouth at bedtime. , Disp: , Rfl: 0 .  traZODone (DESYREL) 50 MG tablet, Take 50 mg by mouth at bedtime., Disp: , Rfl: 0  Past Medical History: Past Medical History:  Diagnosis Date  . Actinic keratoses   . BPH (benign prostatic hyperplasia)   . CKD stage 1 due to type 2 diabetes mellitus ([place])   . COPD (chronic obstructive pulmonary disease) ([place])   . Diabetes mellitus without complication ([place])   . GERD (gastroesophageal reflux disease)   . Hypertension   . Insomnia   . Iron deficiency anemia   . Mixed hyperlipidemia   . OSA (obstructive sleep apnea)     Tobacco  Use: Social History   Tobacco Use  Smoking Status Former Smoker  . Packs/day: 1.50  . Years: 50.00  . Pack years: 75.00  . Types: Cigarettes  . Last attempt to quit: 09/29/2008  . Years since quitting: 9.4  Smokeless Tobacco Never Used    Labs: Recent Review Scientist, physiological    Labs for ITP Cardiac and Pulmonary Rehab Latest Ref Rng & Units 10/29/2017 10/29/2017   Hemoglobin A1c 4.8 - 5.6 % 7.2(H) 7.2(H)       Pulmonary Assessment Scores: Pulmonary Assessment Scores    Row Name 11/30/17 1147 03/11/18 1138       ADL UCSD   ADL Phase  Entry  Exit    SOB Score total  79  16    Rest  1  0    Walk  1  0    Stairs  5  4    Bath  2  0    Dress  3  0    Shop  4  0      CAT Score   CAT Score  26  12      mMRC Score   mMRC Score  1  -       Pulmonary Function  Assessment: Pulmonary Function Assessment - 11/30/17 1038      Pulmonary Function Tests   FVC%  88 % Date performed 11/30/17 [facility]    FEV1%  48 %    FEV1/FVC Ratio  43      Breath   Bilateral Breath Sounds  Clear    Shortness of Breath  Yes;Fear of Shortness of Breath;Limiting activity;Panic with Shortness of Breath       Exercise Target Goals:    Exercise Program Goal: Individual exercise prescription set using results from initial 6 min walk test and THRR while considering  patient's activity barriers and safety.    Exercise Prescription Goal: Initial exercise prescription builds to 30-45 minutes a day of aerobic activity, 2-3 days per week.  Home exercise guidelines will be given to patient during program as part of exercise prescription that the participant will acknowledge.  Activity Barriers & Risk Stratification:   6 Minute Walk: 6 Minute Walk    Row Name 11/30/17 1141         6 Minute Walk   Phase  Initial     Distance  1350 feet     Walk Time  6 minutes     # of Rest Breaks  0     MPH  2.55     METS  3.09     RPE  11     Perceived Dyspnea   2     VO2 Peak  10.84     Symptoms  No     Resting HR  96 bpm     Resting BP  100/56     Resting Oxygen Saturation   90 %     Exercise Oxygen Saturation  during 6 min walk  85 %     Max Ex. HR  126 bpm     Max Ex. BP  126/54     2 Minute Post BP  110/58       Interval HR   1 Minute HR  113     2 Minute HR  115     3 Minute HR  117     4 Minute HR  117     5 Minute HR  117  6 Minute HR  126     2 Minute Post HR  97     Interval Heart Rate?  Yes       Interval Oxygen   Interval Oxygen?  Yes     Baseline Oxygen Saturation %  90 %     1 Minute Oxygen Saturation %  87 %     1 Minute Liters of Oxygen  0 L     2 Minute Oxygen Saturation %  85 %     2 Minute Liters of Oxygen  0 L     3 Minute Oxygen Saturation %  85 %     3 Minute Liters of Oxygen  0 L     4 Minute Oxygen Saturation %  86 %     4 Minute  Liters of Oxygen  0 L     5 Minute Oxygen Saturation %  87 %     5 Minute Liters of Oxygen  0 L     6 Minute Oxygen Saturation %  86 %     6 Minute Liters of Oxygen  0 L     2 Minute Post Oxygen Saturation %  93 %     2 Minute Post Liters of Oxygen  0 L       Oxygen Initial Assessment: Oxygen Initial Assessment - 11/30/17 1046      Home Oxygen   Home Oxygen Device  Home Concentrator;E-Tanks    Sleep Oxygen Prescription  Continuous    Liters per minute  2    Home Exercise Oxygen Prescription  None    Home at Rest Exercise Oxygen Prescription  None    Compliance with Home Oxygen Use  Yes      Initial 6 min Walk   Oxygen Used  None      Program Oxygen Prescription   Program Oxygen Prescription  None      Intervention   Short Term Goals  To learn and exhibit compliance with exercise, home and travel O2 prescription;To learn and understand importance of maintaining oxygen saturations>88%;To learn and demonstrate proper use of respiratory medications;To learn and understand importance of monitoring SPO2 with pulse oximeter and demonstrate accurate use of the pulse oximeter.;To learn and demonstrate proper pursed lip breathing techniques or other breathing techniques.    Long  Term Goals  Exhibits compliance with exercise, home and travel O2 prescription;Verbalizes importance of monitoring SPO2 with pulse oximeter and return demonstration;Maintenance of O2 saturations>88%;Exhibits proper breathing techniques, such as pursed lip breathing or other method taught during program session;Compliance with respiratory medication;Demonstrates proper use of MDI's       Oxygen Re-Evaluation: Oxygen Re-Evaluation    Row Name 12/10/17 1039 01/12/18 1048 02/04/18 1114 03/09/18 1419       Program Oxygen Prescription   Program Oxygen Prescription  None  None  None  None      Home Oxygen   Home Oxygen Device  Home Concentrator;E-Tanks  Home Concentrator;E-Tanks  Home Concentrator;E-Tanks  Home  Concentrator;E-Tanks    Sleep Oxygen Prescription  Continuous  Continuous  Continuous  Continuous    Liters per minute  '2  2  2  2    '$ Home Exercise Oxygen Prescription  None  None  None  None    Home at Rest Exercise Oxygen Prescription  None  None  None  None    Compliance with Home Oxygen Use  Yes  Yes  Yes  Yes      Goals/Expected  Outcomes   Short Term Goals  To learn and exhibit compliance with exercise, home and travel O2 prescription;To learn and understand importance of maintaining oxygen saturations>88%;To learn and demonstrate proper use of respiratory medications;To learn and understand importance of monitoring SPO2 with pulse oximeter and demonstrate accurate use of the pulse oximeter.;To learn and demonstrate proper pursed lip breathing techniques or other breathing techniques.  To learn and exhibit compliance with exercise, home and travel O2 prescription;To learn and understand importance of maintaining oxygen saturations>88%;To learn and demonstrate proper use of respiratory medications;To learn and understand importance of monitoring SPO2 with pulse oximeter and demonstrate accurate use of the pulse oximeter.;To learn and demonstrate proper pursed lip breathing techniques or other breathing techniques.  To learn and exhibit compliance with exercise, home and travel O2 prescription;To learn and understand importance of maintaining oxygen saturations>88%;To learn and demonstrate proper use of respiratory medications;To learn and understand importance of monitoring SPO2 with pulse oximeter and demonstrate accurate use of the pulse oximeter.;To learn and demonstrate proper pursed lip breathing techniques or other breathing techniques.  To learn and exhibit compliance with exercise, home and travel O2 prescription;To learn and understand importance of maintaining oxygen saturations>88%;To learn and demonstrate proper use of respiratory medications;To learn and understand importance of  monitoring SPO2 with pulse oximeter and demonstrate accurate use of the pulse oximeter.;To learn and demonstrate proper pursed lip breathing techniques or other breathing techniques.    Long  Term Goals  Exhibits compliance with exercise, home and travel O2 prescription;Verbalizes importance of monitoring SPO2 with pulse oximeter and return demonstration;Maintenance of O2 saturations>88%;Exhibits proper breathing techniques, such as pursed lip breathing or other method taught during program session;Compliance with respiratory medication;Demonstrates proper use of MDI's  Exhibits compliance with exercise, home and travel O2 prescription;Verbalizes importance of monitoring SPO2 with pulse oximeter and return demonstration;Maintenance of O2 saturations>88%;Exhibits proper breathing techniques, such as pursed lip breathing or other method taught during program session;Compliance with respiratory medication;Demonstrates proper use of MDI's  Exhibits compliance with exercise, home and travel O2 prescription;Verbalizes importance of monitoring SPO2 with pulse oximeter and return demonstration;Maintenance of O2 saturations>88%;Exhibits proper breathing techniques, such as pursed lip breathing or other method taught during program session;Compliance with respiratory medication;Demonstrates proper use of MDI's  Exhibits compliance with exercise, home and travel O2 prescription;Verbalizes importance of monitoring SPO2 with pulse oximeter and return demonstration;Maintenance of O2 saturations>88%;Exhibits proper breathing techniques, such as pursed lip breathing or other method taught during program session;Compliance with respiratory medication;Demonstrates proper use of MDI's    Comments  Reviewed PLB technique with pt.  Talked about how it work and it's important to maintaining his exercise saturations.    Dontavious feels like his breathing has got alot better since the start of the program. He takes his albuterol  treatments at least once a day. He practices with his incentive spirometer at times.  He uses a spacer for his inhalers and uses them independently.  He has an appointment with Dr. Ashby Dawes on Monday to review his CT Scans and PFTs from this past week.  He is hoping that he is not progressing too quickly.  He has been using his medications correctly and feels that they are working well for him,     Richardson Landry has been compliant with his oxygen use at home.  He continues to monitor his saturations at home daily.  He is also doing well with his PLB and has found it to be very helpful with his breathing.   His progression  is ongoing but overall he is dong well.     Goals/Expected Outcomes  Short: Become more profiecient at using PLB.   Long: Become independent at using PLB.  Short: continue to increase his exercise load. Long: independently increase walking speed as tolerated keeping oxygen above 88 percent  Short: Meet with doctor.  Long: Continue to manage his COPD independently.   Short: Continue to monitor his oxygen levels at home. Long: Continue to manage his COPD.        Oxygen Discharge (Final Oxygen Re-Evaluation): Oxygen Re-Evaluation - 03/09/18 1419      Program Oxygen Prescription   Program Oxygen Prescription  None      Home Oxygen   Home Oxygen Device  Home Concentrator;E-Tanks    Sleep Oxygen Prescription  Continuous    Liters per minute  2    Home Exercise Oxygen Prescription  None    Home at Rest Exercise Oxygen Prescription  None    Compliance with Home Oxygen Use  Yes      Goals/Expected Outcomes   Short Term Goals  To learn and exhibit compliance with exercise, home and travel O2 prescription;To learn and understand importance of maintaining oxygen saturations>88%;To learn and demonstrate proper use of respiratory medications;To learn and understand importance of monitoring SPO2 with pulse oximeter and demonstrate accurate use of the pulse oximeter.;To learn and demonstrate proper  pursed lip breathing techniques or other breathing techniques.    Long  Term Goals  Exhibits compliance with exercise, home and travel O2 prescription;Verbalizes importance of monitoring SPO2 with pulse oximeter and return demonstration;Maintenance of O2 saturations>88%;Exhibits proper breathing techniques, such as pursed lip breathing or other method taught during program session;Compliance with respiratory medication;Demonstrates proper use of MDI's    Comments  Richardson Landry has been compliant with his oxygen use at home.  He continues to monitor his saturations at home daily.  He is also doing well with his PLB and has found it to be very helpful with his breathing.   His progression is ongoing but overall he is dong well.     Goals/Expected Outcomes  Short: Continue to monitor his oxygen levels at home. Long: Continue to manage his COPD.        Initial Exercise Prescription: Initial Exercise Prescription - 11/30/17 1100      Date of Initial Exercise RX and Referring Provider   Date  11/30/17    Referring Provider  Ashby Dawes      Treadmill   MPH  2.5    Grade  0.5    Minutes  15    METs  3      Recumbant Bike   Level  3    RPM  60    Watts  28    Minutes  15    METs  3      Arm Ergometer   Level  2    Watts  44    RPM  40    Minutes  15    METs  3      Recumbant Elliptical   Level  2    RPM  60    Minutes  15    METs  3      REL-XR   Level  2    Speed  50    Minutes  15    METs  3      Prescription Details   Frequency (times per week)  3    Duration  Progress to 45  minutes of aerobic exercise without signs/symptoms of physical distress      Intensity   THRR 40-80% of Max Heartrate  118-140    Ratings of Perceived Exertion  11-15    Perceived Dyspnea  0-4      Resistance Training   Training Prescription  Yes    Weight  3 lb    Reps  10-15       Perform Capillary Blood Glucose checks as needed.  Exercise Prescription Changes: Exercise Prescription  Changes    Row Name 12/15/17 1200 12/24/17 1000 12/29/17 1200 01/12/18 1200 01/26/18 1200     Response to Exercise   Blood Pressure (Admit)  132/64  -  118/60  124/64  120/70   Blood Pressure (Exercise)  134/66  -  -  -  -   Blood Pressure (Exit)  116/58  -  124/70  136/60  106/72   Heart Rate (Admit)  111 bpm  -  93 bpm  108 bpm  96 bpm   Heart Rate (Exercise)  123 bpm  -  122 bpm  118 bpm  132 bpm   Heart Rate (Exit)  100 bpm  -  104 bpm  98 bpm  114 bpm   Oxygen Saturation (Admit)  90 %  -  91 %  87 %  92 %   Oxygen Saturation (Exercise)  90 %  -  88 %  90 %  88 %   Oxygen Saturation (Exit)  89 %  -  91 %  92 %  89 %   Rating of Perceived Exertion (Exercise)  12  -  '13  12  12   '$ Perceived Dyspnea (Exercise)  1  -  1  1  0   Symptoms  none  -  none  noone  none   Duration  Continue with 45 min of aerobic exercise without signs/symptoms of physical distress.  -  Continue with 45 min of aerobic exercise without signs/symptoms of physical distress.  Continue with 45 min of aerobic exercise without signs/symptoms of physical distress.  Continue with 45 min of aerobic exercise without signs/symptoms of physical distress.   Intensity  THRR unchanged  -  THRR unchanged  THRR unchanged  THRR unchanged     Progression   Progression  Continue to progress workloads to maintain intensity without signs/symptoms of physical distress.  Continue to progress workloads to maintain intensity without signs/symptoms of physical distress.  Continue to progress workloads to maintain intensity without signs/symptoms of physical distress.  Continue to progress workloads to maintain intensity without signs/symptoms of physical distress.  Continue to progress workloads to maintain intensity without signs/symptoms of physical distress.   Average METs  2.9  2.9  3.3  2.7  3.67     Resistance Training   Training Prescription  Yes  Yes  Yes  Yes  Yes   Weight  3 lb  3 lb  3 lb  7 lb  7 lb   Reps  10-15  10-15  10-15   10-15  10-15     Interval Training   Interval Training  No  No  No  No  No     Treadmill   MPH  2.5  2.5  2.5  -  3   Grade  '2  2  2  '$ -  3   Minutes  '15  15  15  '$ -  15   METs  3.6  3.6  3.6  -  4.54     Recumbant Bike   Level  -  -  3  4  -   RPM  -  -  70  75  -   Watts  -  -  27  32  -   Minutes  -  -  15  15  -   METs  -  -  3  3  -     T5 Nustep   Level  2  2  -  4  4   SPM  80  80  -  87  79   Minutes  15  15  -  15  15   METs  2.2  2.2  -  2.4  2.8     Home Exercise Plan   Plans to continue exercise at  -  [place]   Frequency  -  Add 1 additional day to program exercise sessions. walking at home  -  -  Add 1 additional day to program exercise sessions. walking at home   Initial Home Exercises Provided  -  12/24/17  -  -  12/24/17   Row Name 02/09/18 1400 02/23/18 1200 03/09/18 1100         Response to Exercise   Blood Pressure (Admit)  124/64  118/60  118/60     Blood Pressure (Exit)  134/72  120/60  126/64     Heart Rate (Admit)  99 bpm  101 bpm  121 bpm     Heart Rate (Exercise)  136 bpm  127 bpm  131 bpm     Heart Rate (Exit)  107 bpm  107 bpm  101 bpm     Oxygen Saturation (Admit)  91 %  90 %  92 %     Oxygen Saturation (Exercise)  89 %  85 %  88 %     Oxygen Saturation (Exit)  91 %  92 %  92 %     Rating of Perceived Exertion (Exercise)  '12  12  12     '$ Perceived Dyspnea (Exercise)  0  1  0     Symptoms  none  none  none     Duration  Continue with 45 min of aerobic exercise without signs/symptoms of physical distress.  Continue with 45 min of aerobic exercise without signs/symptoms of physical distress.  Continue with 45 min of aerobic exercise without signs/symptoms of physical distress.     Intensity  THRR unchanged  THRR unchanged  THRR unchanged       Progression   Progression  Continue to progress workloads to maintain intensity without signs/symptoms of physical distress.  Continue to progress workloads to maintain intensity  without signs/symptoms of physical distress.  Continue to progress workloads to maintain intensity without signs/symptoms of physical distress.     Average METs  3.67  3.73  3.1       Resistance Training   Training Prescription  Yes  Yes  Yes     Weight  7 lb  7 lb  7 lb     Reps  10-15  10-15  10-15       Interval Training   Interval Training  No  No  No       Treadmill   MPH  3  3  -     Grade  3  4  -     Minutes  15  15  -     METs  4.54  4.95  -       Recumbant Bike   Level  -  -  6     Watts  -  -  40     Minutes  -  -  15     METs  -  -  3.6       T5 Nustep   Level  '5  6  6     '$ SPM  81  89  88     Minutes  '15  15  15     '$ METs  2.8  2.5  2.6       Home Exercise Plan   Plans to continue exercise at  [employer]     Frequency  Add 1 additional day to program exercise sessions. walking at home  Add 1 additional day to program exercise sessions. walking at home  Add 1 additional day to program exercise sessions. walking at home     Initial Home Exercises Provided  12/24/17  12/24/17  12/24/17        Exercise Comments: Exercise Comments    Row Name 12/10/17 1038 12/24/17 1109         Exercise Comments   First full day of exercise!  Patient was oriented to gym and equipment including functions, settings, policies, and procedures.  Patient's individual exercise prescription and treatment plan were reviewed.  All starting workloads were established based on the results of the 6 minute walk test done at initial orientation visit.  The plan for exercise progression was also introduced and progression will be customized based on patient's performance and goals  Home exercise reviewed with patient. Handout given and signed. Reviewed target heart rate, RPE, dyspnea scale, and safety. Patient verbalized understanding. He will add an extra day of exercise at home. He is planning on attending [facility]         Exercise Goals and Review: Exercise Goals     Row Name 11/30/17 1158             Exercise Goals   Increase Physical Activity  Yes       Intervention  Provide advice, education, support and counseling about physical activity/exercise needs.;Develop an individualized exercise prescription for aerobic and resistive training based on initial evaluation findings, risk stratification, comorbidities and participant's personal goals.       Expected Outcomes  Short Term: Attend rehab on a regular basis to increase amount of physical activity.;Long Term: Add in home exercise to make exercise part of routine and to increase amount of physical activity.;Long Term: Exercising regularly at least 3-5 days a week.       Increase Strength and Stamina  Yes       Intervention  Provide advice, education, support and counseling about physical activity/exercise needs.;Develop an individualized exercise prescription for aerobic and resistive training based on initial evaluation findings, risk stratification, comorbidities and participant's personal goals.       Expected Outcomes  Short Term: Increase workloads from initial exercise prescription for resistance, speed, and METs.;Short Term: Perform resistance training exercises routinely during rehab and add in resistance training at home;Long Term: Improve cardiorespiratory fitness, muscular endurance and strength as measured by increased METs and functional capacity (6MWT)       Able to understand and use rate of perceived exertion (RPE) scale  Yes       Intervention  Provide  education and explanation on how to use RPE scale       Expected Outcomes  Short Term: Able to use RPE daily in rehab to express subjective intensity level;Long Term:  Able to use RPE to guide intensity level when exercising independently       Able to understand and use Dyspnea scale  Yes       Intervention  Provide education and explanation on how to use Dyspnea scale       Expected Outcomes  Short Term: Able to use Dyspnea scale daily in  rehab to express subjective sense of shortness of breath during exertion;Long Term: Able to use Dyspnea scale to guide intensity level when exercising independently       Knowledge and understanding of Target Heart Rate Range (THRR)  Yes       Intervention  Provide education and explanation of THRR including how the numbers were predicted and where they are located for reference       Expected Outcomes  Short Term: Able to state/look up THRR;Long Term: Able to use THRR to govern intensity when exercising independently;Short Term: Able to use daily as guideline for intensity in rehab       Able to check pulse independently  Yes       Intervention  Provide education and demonstration on how to check pulse in carotid and radial arteries.;Review the importance of being able to check your own pulse for safety during independent exercise       Expected Outcomes  Short Term: Able to explain why pulse checking is important during independent exercise;Long Term: Able to check pulse independently and accurately       Understanding of Exercise Prescription  Yes       Intervention  Provide education, explanation, and written materials on patient's individual exercise prescription       Expected Outcomes  Short Term: Able to explain program exercise prescription;Long Term: Able to explain home exercise prescription to exercise independently          Exercise Goals Re-Evaluation : Exercise Goals Re-Evaluation    Row Name 12/10/17 1038 12/15/17 1241 12/24/17 1056 12/29/17 1302 01/12/18 1249     Exercise Goal Re-Evaluation   Exercise Goals Review  Understanding of Exercise Prescription;Able to understand and use Dyspnea scale;Knowledge and understanding of Target Heart Rate Range (THRR);Able to understand and use rate of perceived exertion (RPE) scale  Increase Physical Activity;Increase Strength and Stamina;Able to understand and use Dyspnea scale;Able to understand and use rate of perceived exertion (RPE) scale   Increase Physical Activity;Increase Strength and Stamina;Able to understand and use Dyspnea scale;Able to understand and use rate of perceived exertion (RPE) scale;Understanding of Exercise Prescription;Knowledge and understanding of Target Heart Rate Range (THRR);Able to check pulse independently  Increase Physical Activity;Increase Strength and Stamina;Able to understand and use Dyspnea scale;Able to understand and use rate of perceived exertion (RPE) scale  Increase Physical Activity;Able to understand and use rate of perceived exertion (RPE) scale;Increase Strength and Stamina;Able to understand and use Dyspnea scale;Knowledge and understanding of Target Heart Rate Range (THRR);Understanding of Exercise Prescription   Comments  Reviewed RPE scale, THR and program prescription with pt today.  Pt voiced understanding and was given a copy of goals to take home.   Egon has tolerated exercise well so far.  He is working at a higher incline on [employer] exercise done with patient. Handout given and signed.  Pt is progressing well - average MET level is up  to 3.3  Staff will continue to monitor  Richardson Landry is progressing well and has moved up his DB weight and intensity on . Staff will continute to monitor   Expected Outcomes  Short: Use RPE daily to regulate intensity.  Long: Follow program prescription in THR.  Short - Alecxander will continue to attend regularly Long - Louise will improve overall MET level  Short: add one extra day of exercise outside of the program. Long: become indpendent with exercise after graduation  Short - Pt will attend regularly Long - Pt will continue to increase MET level  Short - Richardson Landry will attend regularly and exercise at home  [place] will maintain gains on his own   [place] Name 01/26/18 1248 02/04/18 1059 02/09/18 1458 02/23/18 1233 03/09/18 1133     Exercise Goal Re-Evaluation   Exercise Goals Review  Increase Physical Activity;Able to understand and use rate of perceived exertion  (RPE) scale;Knowledge and understanding of Target Heart Rate Range (THRR);Able to understand and use Dyspnea scale;Increase Strength and Stamina  Increase Physical Activity;Understanding of Exercise Prescription;Increase Strength and Stamina  Increase Physical Activity;Able to understand and use rate of perceived exertion (RPE) scale;Knowledge and understanding of Target Heart Rate Range (THRR);Increase Strength and Stamina;Able to understand and use Dyspnea scale  Increase Physical Activity;Able to understand and use rate of perceived exertion (RPE) scale;Increase Strength and Stamina;Able to understand and use Dyspnea scale;Knowledge and understanding of Target Heart Rate Range (THRR)  Increase Physical Activity;Increase Strength and Stamina;Able to understand and use Dyspnea scale;Able to understand and use rate of perceived exertion (RPE) scale   Comments  Richardson Landry has increased overall MET level.  Staff will monitor progress and suggest interval training  Aryan has been doing well in rehab. He is doing his home exercise by doing the treadmill and weights at home.  He did 4mn on the treadmill yesterday!!  He has been feeling stronger and has more stamina.   SRichardson Landryis maintaining his current MET level of 3.76.  His O2 levels are staying at 89-90 during exercise so no further increases would be appropriate at this time.    SRichardson Landryhas progressed well with exercise.  Staff will monitor TM intensity to keep 02 above 88%  SRichardson Landryreturned today after missing 2 weeks due to illness and family illness.  He was able to pick up same level as before on machines.    Expected Outcomes  Short - SRichardson Landrywill attend regularly Long - SRichardson Landrywill exercise on his own  Short: Continue to use treadmill on off days.  Long: Continue to increase strength and stamina.   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  [facility].  He feels that he has made 100%  improvement in his strength and stamina and it breathing better too.  Richardson Landry has been very happy with his results from exercise and the program.         Expected Outcomes  Short: Improve post 6MWT and graduate.  Long: Continue to exercise on his own.           Discharge Exercise Prescription (Final Exercise Prescription Changes): Exercise Prescription Changes - 03/09/18 1100      Response to Exercise   Blood Pressure (Admit)  118/60    Blood Pressure (Exit)  126/64    Heart Rate (Admit)  121 bpm    Heart Rate (Exercise)  131 bpm    Heart Rate (Exit)  101 bpm    Oxygen Saturation (Admit)  92 %    Oxygen Saturation (Exercise)  88 %    Oxygen Saturation (Exit)  92 %    Rating of Perceived Exertion (Exercise)  12    Perceived Dyspnea (Exercise)  0    Symptoms  none    Duration  Continue with 45 min of aerobic exercise without signs/symptoms of physical distress.    Intensity  THRR unchanged      Progression   Progression  Continue to progress workloads to maintain intensity without signs/symptoms of physical distress.    Average METs  3.1      Resistance Training   Training Prescription  Yes    Weight  7 lb    Reps  10-15      Interval Training   Interval Training  No      Recumbant Bike   Level  6    Watts  40    Minutes  15    METs  3.6      T5 Nustep   Level  6    SPM  88    Minutes  15    METs  2.6      Home Exercise Plan   Plans to  continue exercise at  [employer]    Frequency  Add 1 additional day to program exercise sessions. walking at home    Initial Home Exercises Provided  12/24/17       Nutrition:  Target Goals: Understanding of nutrition guidelines, daily intake of sodium '1500mg'$ , cholesterol '200mg'$ , calories 30% from fat and 7% or less from saturated fats, daily to have 5 or more servings of fruits and vegetables.  Biometrics: Pre Biometrics - 11/30/17 1144      Pre Biometrics   Height  5' 5.25" (1.657 m)    Weight  179 lb 6.4 oz (81.4 kg)    Waist Circumference  41.5 inches    Hip Circumference  42 inches    Waist to Hip Ratio  0.99 %    BMI (Calculated)  29.64        Nutrition Therapy Plan and Nutrition Goals: Nutrition Therapy & Goals - 11/30/17 1037      Personal Nutrition Goals   Comments  eat healthier and lose some wieght.      Intervention Plan   Intervention  Prescribe, educate and counsel regarding individualized specific dietary modifications aiming towards targeted core components such as weight, hypertension, lipid management, diabetes, heart failure and other comorbidities.;Nutrition handout(s) given to patient.    Expected Outcomes  Short Term Goal: Understand basic principles of dietary content, such as calories, fat, sodium, cholesterol and nutrients.;Short Term Goal: A plan has been developed with personal nutrition goals set during dietitian appointment.;Long Term Goal: Adherence to  prescribed nutrition plan.       Nutrition Assessments: Nutrition Assessments - 03/11/18 1141      MEDFICTS Scores   Pre Score  108    Post Score  66    Score Difference  -42       Nutrition Goals Re-Evaluation: Nutrition Goals Re-Evaluation    Row Name 12/15/17 1028 01/12/18 1054 02/04/18 1109 03/09/18 1408       Goals   Current Weight  176 lb (79.8 kg)  178 lb (80.7 kg)  181 lb (82.1 kg)  180 lb (81.6 kg)    Nutrition Goal  Lose some weight. Eat as healthy as he can.  Still lose  weight  Still lose weight  Meet with nutritionist.   Weight loss    Comment  He would like to lose some more weight to help him breath. He does not want to meet with the dietician since he has seen a dietician for his diabetes.  Some days he is eating what he wants. He says he will eat a hamburger, steak or fries at least once a week. He says sometimes he gets really full and sometimes he doesn't.   He has been induling in some ice cream.  He has not meet with nutritionist and has appointmnent scheduled for 4/29 during class.   He is eating, but could use some tweaks.   Richardson Landry has been working towards losing weight.  He has cut back on his portions.  He is eating around 6 small meals a day, but some of the "snacks" are not always healthy.  He knows what to do, but says that he does not want to give up everything.  He also struggles with eating on the go a out due to his schedule.     Expected Outcome  Short: lose 5 more pounds. Long: maintain weight Loss.  Short: lose 5 more pounds. Long: maintain weight Loss.  Short: Meet with dietician.  Long: Continue to work on weight loss.   Short: Continue to work on portion control and healthy snacks.  Long: Continue to work on weight loss.        Nutrition Goals Discharge (Final Nutrition Goals Re-Evaluation): Nutrition Goals Re-Evaluation - 03/09/18 1408      Goals   Current Weight  180 lb (81.6 kg)    Nutrition Goal  Weight loss    Comment  Richardson Landry has been working towards losing weight.  He has cut back on his portions.  He is eating around 6 small meals a day, but some of the "snacks" are not always healthy.  He knows what to do, but says that he does not want to give up everything.  He also struggles with eating on the go a out due to his schedule.     Expected Outcome  Short: Continue to work on portion control and healthy snacks.  Long: Continue to work on weight loss.        Psychosocial: Target Goals: Acknowledge presence or absence of significant  depression and/or stress, maximize coping skills, provide positive support system. Participant is able to verbalize types and ability to use techniques and skills needed for reducing stress and depression.   Initial Review & Psychosocial Screening: Initial Psych Review & Screening - 11/30/17 1032      Initial Review   Current issues with  Current Sleep Concerns;Current Stress Concerns    Source of Stress Concerns  Chronic Illness;Unable to perform yard/household activities;Unable to participate in former interests  or hobbies    Comments  His yard work got to be too much for him, he cant swim like he wants to. He lost 10 pounds recently and has not exercised in many years.      Family Dynamics   Good Support System?  Yes    Comments  He looks to his wife and his daughter inlaw for support      Barriers   Psychosocial barriers to participate in program  The patient should benefit from training in stress management and relaxation.      Screening Interventions   Interventions  Program counselor consult;Encouraged to exercise;Provide feedback about the scores to participant;To provide support and resources with identified psychosocial needs    Expected Outcomes  Short Term goal: Utilizing psychosocial counselor, staff and physician to assist with identification of specific Stressors or current issues interfering with healing process. Setting desired goal for each stressor or current issue identified.;Long Term goal: The participant improves quality of Life and PHQ9 Scores as seen by post scores and/or verbalization of changes;Short Term goal: Identification and review with participant of any Quality of Life or Depression concerns found by scoring the questionnaire.;Long Term Goal: Stressors or current issues are controlled or eliminated.       Quality of Life Scores:  Scores of 19 and below usually indicate a poorer quality of life in these areas.  A difference of  2-3 points is a clinically  meaningful difference.  A difference of 2-3 points in the total score of the Quality of Life Index has been associated with significant improvement in overall quality of life, self-image, physical symptoms, and general health in studies assessing change in quality of life.  PHQ-9: Recent Review Flowsheet Data    Depression screen [facility] 2/9 03/11/2018 12/13/2017 11/30/2017   Decreased Interest 0 3 3   Down, Depressed, Hopeless 0 0 0   PHQ - 2 Score 0 3 3   Altered sleeping 0 1 3   Tired, decreased energy '1 3 3   '$ Change in appetite 0 0 0   Feeling bad or failure about yourself  0 0 0   Trouble concentrating 0 0 0   Moving slowly or fidgety/restless 0 0 0   Suicidal thoughts 0 0 0   PHQ-9 Score '1 7 9   '$ Difficult doing work/chores Not difficult at all Not difficult at all Not difficult at all     Interpretation of Total Score  Total Score Depression Severity:  1-4 = Minimal depression, 5-9 = Mild depression, 10-14 = Moderate depression, 15-19 = Moderately severe depression, 20-27 = Severe depression   Psychosocial Evaluation and Intervention: Psychosocial Evaluation - 12/13/17 1115      Psychosocial Evaluation & Interventions   Interventions  Stress management education;Relaxation education;Encouraged to exercise with the program and follow exercise prescription    Comments  Counselor met with Mr. Kendrick today for initial psychosocial evaluation.  He is a 72 year old who has COPD and was hospitalized recently for flu and pneumonia.  He has a strong support system with a spouse; several sons and their families; and active involvement in his local church.  Roxanne reports his sleep has improved just recently with a change of medication back to Ambien since the Trazodone was not helpful.  He has a good appetite and denies a history of depression or anxiety - or any current symptoms.  Delquan states he is typically in a positive mood and has minimal stress in his life -  although his spouse is scheduled  for surgery this Friday and he is concerned about that.  He has goals for this program to help him breathe better; to reduce the use of Oxygen; to improve his quality of breathing and to exercise consistently.  Staff will follow with Cavon throughout the course of this program.      Expected Outcomes  Rogerick will benefit from consistent exercise to achieve his stated goals.  The educational and psychoeducational components of this program will help him understand; manage and cope better with life in general.  Staff will follow.     Continue Psychosocial Services   Follow up required by staff       Psychosocial Re-Evaluation: Psychosocial Re-Evaluation    [place] Name 12/15/17 1031 01/12/18 1058 02/04/18 1111 02/09/18 1033 03/09/18 1414     Psychosocial Re-Evaluation   Current issues with  Current Sleep Concerns;Current Stress Concerns  Current Sleep Concerns;Current Stress Concerns  Current Sleep Concerns;Current Stress Concerns  -  Current Sleep Concerns;Current Stress Concerns   Comments  Karin has been sleeping better. His oxygen and a wedge pillow has helped him sleep. He has a sleep number bed coming this week. Exercising in rehab has helped him sleep better.  Joachim states his sleeping has got a little better since last time. His attitude is positive and says the program has helped him breath better.  Deondray continues to sleep better!  Exercise has helped him feel a lot better.  He remains postive.  He has an appointment with Dr. Ashby Dawes on Monday to review his CT Scans and PFTs from this past week.  He is hoping that he is not progressing too quickly.  Counselor follow up with Remo Lipps today reporting feeling stronger and more energetic since coming into this program.  He continues to have some stress with his spouse's health but he is coping better with exercise.  Kasem is sleeping better and reports he continues to need the O2 but his levels are better during the day now.  counselor commended  Remo Lipps for his progress made so far.  Richardson Landry has done well in the program.  He has been out the last two weeks. He was out sick for a week and then lost his mother.  He has come to terms with his mother's death as she was 20 years old.  He continues to sleep well and is breathing much better than when he started the program.  He has enjoyed the program and has found the best part to be meeting new people and having the staff around.  He feels that he has learned a lot from class.    Expected Outcomes  Short: use his pillow and new bed to sleep better. Long: maintain a good sleep pattern and be compliant with equipment.  Short: continue exercise to reduce stress. Long: independently exercise to keep stress to a miminum.  Short: Meet with doctor to learn about progression of disease. Long: Continue to remain positive.   Short:  Continue to stay positive re: his spouse's health.   Long:  Continue to exercise consistently for overall health and breathing better.   Short: Continue to cope well.  Long: Continue to stay postive overall.    Interventions  Encouraged to attend Pulmonary Rehabilitation for the exercise  Encouraged to attend Pulmonary Rehabilitation for the exercise  Encouraged to attend Pulmonary Rehabilitation for the exercise  Stress management education;Relaxation education  Stress management education;Relaxation education   Continue Psychosocial Services  Follow up required by staff  Follow up required by staff  Follow up required by staff  Follow up required by staff  Follow up required by staff     Initial Review   Source of Stress Concerns  -  -  -  -  Chronic Illness;Unable to perform yard/household activities;Unable to participate in former interests or hobbies      Psychosocial Discharge (Final Psychosocial Re-Evaluation): Psychosocial Re-Evaluation - 03/09/18 1414      Psychosocial Re-Evaluation   Current issues with  Current Sleep Concerns;Current Stress Concerns    Comments  Richardson Landry  has done well in the program.  He has been out the last two weeks. He was out sick for a week and then lost his mother.  He has come to terms with his mother's death as she was 32 years old.  He continues to sleep well and is breathing much better than when he started the program.  He has enjoyed the program and has found the best part to be meeting new people and having the staff around.  He feels that he has learned a lot from class.     Expected Outcomes  Short: Continue to cope well.  Long: Continue to stay postive overall.     Interventions  Stress management education;Relaxation education    Continue Psychosocial Services   Follow up required by staff      Initial Review   Source of Stress Concerns  Chronic Illness;Unable to perform yard/household activities;Unable to participate in former interests or hobbies       Education: Education Goals: Education classes will be provided on a weekly basis, covering required topics. Participant will state understanding/return demonstration of topics presented.  Learning Barriers/Preferences: Learning Barriers/Preferences - 11/30/17 1039      Learning Barriers/Preferences   Learning Barriers  Sight wears glasses    Learning Preferences  None       Education Topics:  Initial Evaluation Education: - Verbal, written and demonstration of respiratory meds, oximetry and breathing techniques. Instruction on use of nebulizers and MDIs and importance of monitoring MDI activations.   Pulmonary Rehab from 03/16/2018 in [facility] Cardiac and Pulmonary Rehab  Date  11/30/17  Educator  [facility]  Instruction Review Code  1- Verbalizes Understanding      General Nutrition Guidelines/Fats and Fiber: -Group instruction provided by verbal, written material, models and posters to present the general guidelines for heart healthy nutrition. Gives an explanation and review of dietary fats and fiber.   Pulmonary Rehab from 03/16/2018 in [facility] Cardiac and Pulmonary Rehab   Date  01/10/18  Educator  CR  Instruction Review Code  1- Verbalizes Understanding      Controlling Sodium/Reading Food Labels: -Group verbal and written material supporting the discussion of sodium use in heart healthy nutrition. Review and explanation with models, verbal and written materials for utilization of the food label.   Pulmonary Rehab from 03/16/2018 in [facility] Cardiac and Pulmonary Rehab  Date  01/17/18  Educator  CR  Instruction Review Code  1- Verbalizes Understanding      Exercise Physiology & General Exercise Guidelines: - Group verbal and written instruction with models to review the exercise physiology of the cardiovascular system and associated critical values. Provides general exercise guidelines with specific guidelines to those with heart or lung disease.    Pulmonary Rehab from 03/16/2018 in [facility] Cardiac and Pulmonary Rehab  Date  01/26/18  Educator  [facility]  Instruction Review Code  1- Verbalizes Understanding  Aerobic Exercise & Resistance Training: - Gives group verbal and written instruction on the various components of exercise. Focuses on aerobic and resistive training programs and the benefits of this training and how to safely progress through these programs.   Flexibility, Balance, Mind/Body Relaxation: Provides group verbal/written instruction on the benefits of flexibility and balance training, including mind/body exercise modes such as yoga, pilates and tai chi.  Demonstration and skill practice provided.   Pulmonary Rehab from 03/16/2018 in [facility] Cardiac and Pulmonary Rehab  Date  02/23/18  Educator  AS  Instruction Review Code  1- Verbalizes Understanding      Stress and Anxiety: - Provides group verbal and written instruction about the health risks of elevated stress and causes of high stress.  Discuss the correlation between heart/lung disease and anxiety and treatment options. Review healthy ways to manage with stress and anxiety.   Pulmonary  Rehab from 03/16/2018 in [facility] Cardiac and Pulmonary Rehab  Date  03/16/18  Educator  [facility]  Instruction Review Code  1- Verbalizes Understanding      Depression: - Provides group verbal and written instruction on the correlation between heart/lung disease and depressed mood, treatment options, and the stigmas associated with seeking treatment.   Exercise & Equipment Safety: - Individual verbal instruction and demonstration of equipment use and safety with use of the equipment.   Pulmonary Rehab from 03/16/2018 in [facility] Cardiac and Pulmonary Rehab  Date  11/30/17  Educator  [facility]  Instruction Review Code  1- Verbalizes Understanding      Infection Prevention: - Provides verbal and written material to individual with discussion of infection control including proper hand washing and proper equipment cleaning during exercise session.   Pulmonary Rehab from 03/16/2018 in [facility] Cardiac and Pulmonary Rehab  Date  11/30/17  Educator  [facility]  Instruction Review Code  1- Verbalizes Understanding      Falls Prevention: - Provides verbal and written material to individual with discussion of falls prevention and safety.   Pulmonary Rehab from 03/16/2018 in [place] [facility] Cardiac and Pulmonary Rehab  Date  11/30/17  Educator  [facility]  Instruction Review Code  1- Verbalizes Understanding      Diabetes: - Individual verbal and written instruction to review signs/symptoms of diabetes, desired ranges of glucose level fasting, after meals and with exercise. Advice that pre and post exercise glucose checks will be done for 3 sessions at entry of program.   Chronic Lung Diseases: - Group verbal and written instruction to review updates, respiratory medications, advancements in procedures and treatments. Discuss use of supplemental oxygen including available portable oxygen systems, continuous and intermittent flow rates, concentrators, personal use and safety guidelines. Review proper use of inhaler and spacers. Provide  informative websites for self-education.    Pulmonary Rehab from 03/16/2018 in [facility] Cardiac and Pulmonary Rehab  Date  01/12/18  Educator  [facility]  Instruction Review Code  1- Verbalizes Understanding      Energy Conservation: - Provide group verbal and written instruction for methods to conserve energy, plan and organize activities. Instruct on pacing techniques, use of adaptive equipment and posture/positioning to relieve shortness of breath.   Pulmonary Rehab from 03/16/2018 in [facility] Cardiac and Pulmonary Rehab  Date  03/09/18  Educator  [facility]  Instruction Review Code  1- Verbalizes Understanding      Triggers and Exacerbations: - Group verbal and written instruction to review types of environmental triggers and ways to prevent exacerbations. Discuss weather changes, air quality and the benefits of nasal  washing. Review warning signs and symptoms to help prevent infections. Discuss techniques for effective airway clearance, coughing, and vibrations.   Pulmonary Rehab from 03/16/2018 in [facility] Cardiac and Pulmonary Rehab  Date  02/09/18  Educator  [facility]  Instruction Review Code  1- Verbalizes Understanding      AED/CPR: - Group verbal and written instruction with the use of models to demonstrate the basic use of the AED with the basic ABC's of resuscitation.   Pulmonary Rehab from 03/16/2018 in [facility] Cardiac and Pulmonary Rehab  Date  12/17/17  Educator  [facility]  Instruction Review Code  1- Actuary and Physiology of the Lungs: - Group verbal and written instruction with the use of models to provide basic lung anatomy and physiology related to function, structure and complications of lung disease.   Pulmonary Rehab from 03/16/2018 in [facility] Cardiac and Pulmonary Rehab  Date  12/29/17  Educator  [facility]  Instruction Review Code  1- Verbalizes Understanding      Anatomy & Physiology of the Heart: - Group verbal and written instruction and models provide basic cardiac anatomy  and physiology, with the coronary electrical and arterial systems. Review of Valvular disease and Heart Failure   Pulmonary Rehab from 03/16/2018 in [facility] Cardiac and Pulmonary Rehab  Date  02/16/18  Educator  [facility]  Instruction Review Code  1- Verbalizes Understanding      Cardiac Medications: - Group verbal and written instruction to review commonly prescribed medications for heart disease. Reviews the medication, class of the drug, and side effects.   Know Your Numbers and Risk Factors: -Group verbal and written instruction about important numbers in your health.  Discussion of what are risk factors and how they play a role in the disease process.  Review of Cholesterol, Blood Pressure, Diabetes, and BMI and the role they play in your overall health.   Pulmonary Rehab from 03/16/2018 in [facility] Cardiac and Pulmonary Rehab  Date  01/28/18  Educator  [facility]  Instruction Review Code  1- Verbalizes Understanding      Sleep Hygiene: -Provides group verbal and written instruction about how sleep can affect your health.  Define sleep hygiene, discuss sleep cycles and impact of sleep habits. Review good sleep hygiene tips.    Pulmonary Rehab from 03/16/2018 in [facility] Cardiac and Pulmonary Rehab  Date  02/02/18  Educator  [facility]  Instruction Review Code  1- Verbalizes Understanding      Other: -Provides group and verbal instruction on various topics (see comments)    Knowledge Questionnaire Score: Knowledge Questionnaire Score - 03/11/18 1140      Knowledge Questionnaire Score   Pre Score  15/18    Post Score  18/18        Core Components/Risk Factors/Patient Goals at Admission: Personal Goals and Risk Factors at Admission - 11/30/17 1047      Core Components/Risk Factors/Patient Goals on Admission    Weight Management  Yes;Weight Loss    Intervention  Weight Management: Develop a combined nutrition and exercise program designed to reach desired caloric intake, while maintaining appropriate  intake of nutrient and fiber, sodium and fats, and appropriate energy expenditure required for the weight goal.;Weight Management: Provide education and appropriate resources to help participant work on and attain dietary goals.;Weight Management/Obesity: Establish reasonable short term and long term weight goals.    Admit Weight  179 lb 6.4 oz (81.4 kg)    Goal Weight: Short Term  174 lb (78.9 kg)  Goal Weight: Long Term  170 lb (77.1 kg)    Expected Outcomes  Short Term: Continue to assess and modify interventions until short term weight is achieved;Long Term: Adherence to nutrition and physical activity/exercise program aimed toward attainment of established weight goal;Weight Maintenance: Understanding of the daily nutrition guidelines, which includes 25-35% calories from fat, 7% or less cal from saturated fats, less than '200mg'$  cholesterol, less than 1.5gm of sodium, & 5 or more servings of fruits and vegetables daily;Weight Loss: Understanding of general recommendations for a balanced deficit meal plan, which promotes 1-2 lb weight loss per week and includes a negative energy balance of [phone] kcal/d;Understanding recommendations for meals to include 15-35% energy as protein, 25-35% energy from fat, 35-60% energy from carbohydrates, less than '200mg'$  of dietary cholesterol, 20-35 gm of total fiber daily;Understanding of distribution of calorie intake throughout the day with the consumption of 4-5 meals/snacks    Improve shortness of breath with ADL's  Yes    Intervention  Provide education, individualized exercise plan and daily activity instruction to help decrease symptoms of SOB with activities of daily living.    Expected Outcomes  Short Term: Improve cardiorespiratory fitness to achieve a reduction of symptoms when performing ADLs;Long Term: Be able to perform more ADLs without symptoms or delay the onset of symptoms    Diabetes  Yes    Intervention  Provide education about signs/symptoms and  action to take for hypo/hyperglycemia.;Provide education about proper nutrition, including hydration, and aerobic/resistive exercise prescription along with prescribed medications to achieve blood glucose in normal ranges: Fasting glucose 65-99 mg/dL    Expected Outcomes  Short Term: Participant verbalizes understanding of the signs/symptoms and immediate care of hyper/hypoglycemia, proper foot care and importance of medication, aerobic/resistive exercise and nutrition plan for blood glucose control.;Long Term: Attainment of HbA1C < 7%.    Hypertension  Yes    Intervention  Provide education on lifestyle modifcations including regular physical activity/exercise, weight management, moderate sodium restriction and increased consumption of fresh fruit, vegetables, and low fat dairy, alcohol moderation, and smoking cessation.;Monitor prescription use compliance.    Expected Outcomes  Long Term: Maintenance of blood pressure at goal levels.;Short Term: Continued assessment and intervention until BP is < 140/5m HG in hypertensive participants. < 130/831mHG in hypertensive participants with diabetes, heart failure or chronic kidney disease.    Lipids  Yes    Intervention  Provide education and support for participant on nutrition & aerobic/resistive exercise along with prescribed medications to achieve LDL '70mg'$ , HDL >'40mg'$ .    Expected Outcomes  Short Term: Participant states understanding of desired cholesterol values and is compliant with medications prescribed. Participant is following exercise prescription and nutrition guidelines.;Long Term: Cholesterol controlled with medications as prescribed, with individualized exercise RX and with personalized nutrition plan. Value goals: LDL < '70mg'$ , HDL > 40 mg.       Core Components/Risk Factors/Patient Goals Review:  Goals and Risk Factor Review    Row Name 01/12/18 1044 02/04/18 1101 03/09/18 1406         Core Components/Risk Factors/Patient Goals Review    Personal Goals Review  Weight Management/Obesity;Improve shortness of breath with ADL's;Diabetes;Hypertension;Lipids  Weight Management/Obesity;Improve shortness of breath with ADL's;Diabetes;Hypertension;Lipids  Weight Management/Obesity;Improve shortness of breath with ADL's;Diabetes;Hypertension;Lipids     Review  StDanileels like his breathing has got alot better since the start of the program. He feels alot stronger and that his breathing is easier. He checks his blood sugar 3 times a day and has no  issues checking. He has a wellness appointment in June to check is lipids and other labs.  Mose's weight was up some today at 181 lbs but he did have some ice cream last night.  He is doing a little better with his breathing. He still gets SOB when he really exerts and stamina also. He feels 500% better but still wants to keep improving.  He continues to check his blood sugars in the mornings and averages 110-125.  His blood pressures have been good in class, but he does not check them at home.  His meds are working well for him.  Steve's weight has continue to stay steady around 180 lbs.  His breathing has greatly improved since starting the program.  He has been doing more with her SOB.  He has been doing well with his blood pressures and checks it at home at least three times a week.  he continues to check his blood sugars daily and they have been good.       Expected Outcomes  Short: go to his appointment to check cholestorol. Long: keep cholestorol within normal limits.  Short: Continue to work on weight loss.  Long: Continue to improve SOB.   Short: Continue to work towards weight loss.  Long: Continue to work on risk factors.         Core Components/Risk Factors/Patient Goals at Discharge (Final Review):  Goals and Risk Factor Review - 03/09/18 1406      Core Components/Risk Factors/Patient Goals Review   Personal Goals Review  Weight Management/Obesity;Improve shortness of breath with  ADL's;Diabetes;Hypertension;Lipids    Review  Steve's weight has continue to stay steady around 180 lbs.  His breathing has greatly improved since starting the program.  He has been doing more with her SOB.  He has been doing well with his blood pressures and checks it at home at least three times a week.  he continues to check his blood sugars daily and they have been good.      Expected Outcomes  Short: Continue to work towards weight loss.  Long: Continue to work on risk factors.        ITP Comments: ITP Comments    Row Name 11/30/17 1138 12/27/17 0829 01/24/18 0840 02/21/18 0831 02/28/18 1023   ITP Comments  Medical Evaluation completed. Chart sent for review and changes to Dr. Emily Filbert Director of [place]. Diagnosis can be found in [facility] encounter 11/15/17  30 day review completed. ITP sent to Dr. Emily Filbert Director of [place]. Continue with ITP unless changes are made by physician.   30 day review completed. ITP sent to Dr. Emily Filbert Director of [place]. Continue with ITP unless changes are made by physician   30 day review completed. ITP sent to Dr. Emily Filbert Director of [place]. Continue with ITP unless changes are made by physician  Richardson Landry called to let us know that he will be out again today.  He has had a virus since Friday of last week that has left him feeling very weak.    [place] Name 03/04/18 1007 03/21/18 0828         ITP Comments  Richardson Landry is feeling better, but his mom passed away.  He will be out today.    30 day review completed. ITP sent to Dr. Emily Filbert Director of [place]. Continue with ITP unless changes are made by physician         Comments: 30 day review

## 2018-03-21 NOTE — Progress Notes (Signed)
Daily Session Note  Patient Details  Name: Billy Wise MRN: [id number] Date of Birth: [birth year] Referring Provider:     Pulmonary Rehab from 11/30/2017 in [facility] Cardiac and Pulmonary Rehab  Referring Provider  Ramachandran      Encounter Date: 03/21/2018  Check In: Session Check In - 03/21/18 1128      Check-In   Location  [facility]-Cardiac & Pulmonary Rehab    Staff Present  Justin Mend Jaci Carrel, BS, ACSM CEP, Exercise Physiologist;Mandi Zachery Conch, BS, [facility]    Supervising physician immediately available to respond to emergencies  [facility] immediately available ER MD    Physician(s)  Dr. Corky Downs and Cinda Quest    Medication changes reported      No    Fall or balance concerns reported     No    Tobacco Cessation  No Change    Warm-up and Cool-down  Performed as group-led instruction    Resistance Training Performed  Yes    VAD Patient?  No      Pain Assessment   Currently in Pain?  No/denies          Social History   Tobacco Use  Smoking Status Former Smoker  . Packs/day: 1.50  . Years: 50.00  . Pack years: 75.00  . Types: Cigarettes  . Last attempt to quit: 09/29/2008  . Years since quitting: 9.4  Smokeless Tobacco Never Used    Goals Met:  Independence with exercise equipment Exercise tolerated well No report of cardiac concerns or symptoms Strength training completed today  Goals Unmet:  Not Applicable  Comments:  6 Minute Walk    Row Name 11/30/17 1141 03/21/18 1132       6 Minute Walk   Phase  Initial  Discharge    Distance  1350 feet  1700 feet    Distance % Change  -  26 %    Distance Feet Change  -  350 ft    Walk Time  6 minutes  6 minutes    # of Rest Breaks  0  0    MPH  2.55  3.21    METS  3.09  3.75    RPE  11  7    Perceived Dyspnea   2  0    VO2 Peak  10.84  13.14    Symptoms  No  No    Resting HR  96 bpm  92 bpm    Resting BP  100/56  120/70    Resting Oxygen Saturation   90 %  94 %    Exercise Oxygen Saturation   during 6 min walk  85 %  88 %    Max Ex. HR  126 bpm  118 bpm    Max Ex. BP  126/54  142/58    2 Minute Post BP  110/58  120/62      Interval HR   1 Minute HR  113  116    2 Minute HR  115  108    3 Minute HR  117  110    4 Minute HR  117  118    5 Minute HR  117  107    6 Minute HR  126  117    2 Minute Post HR  97  93    Interval Heart Rate?  Yes  Yes      Interval Oxygen   Interval Oxygen?  Yes  Yes    Baseline Oxygen Saturation %  90 %  94 %    1 Minute Oxygen Saturation %  87 %  93 %    1 Minute Liters of Oxygen  0 L  0 L    2 Minute Oxygen Saturation %  85 %  91 %    2 Minute Liters of Oxygen  0 L  0 L    3 Minute Oxygen Saturation %  85 %  92 %    3 Minute Liters of Oxygen  0 L  0 L    4 Minute Oxygen Saturation %  86 %  88 %    4 Minute Liters of Oxygen  0 L  0 L    5 Minute Oxygen Saturation %  87 %  89 %    5 Minute Liters of Oxygen  0 L  0 L    6 Minute Oxygen Saturation %  86 %  89 %    6 Minute Liters of Oxygen  0 L  0 L    2 Minute Post Oxygen Saturation %  93 %  96 %    2 Minute Post Liters of Oxygen  0 L  0 L     Pt able to follow exercise prescription today without complaint.  Will continue to monitor for progression.   Dr. Emily Filbert is Medical Director for [facility] and [facility] Pulmonary Rehabilitation.

## 2018-03-23 DIAGNOSIS — J449 Chronic obstructive pulmonary disease, unspecified: Secondary | ICD-10-CM

## 2018-03-23 DIAGNOSIS — J849 Interstitial pulmonary disease, unspecified: Secondary | ICD-10-CM | POA: Diagnosis not present

## 2018-03-23 NOTE — Progress Notes (Signed)
Pulmonary Individual Treatment Plan  Patient Details  Name: Billy Wise MRN: [id number] Date of Birth: 06-Aug-1946 Referring Provider:     Pulmonary Rehab from 11/30/2017 in [facility]monary [facility]  Referring Provider  Ramachandran      Initial Encounter Date:    Pulmonary Rehab from 11/30/2017 in [facility] Cardiac and Pulmonary Rehab  Date  11/30/17  Referring Provider  Ashby Dawes      Visit Diagnosis: COPD with chronic bronchitis and emphysema ([place])  Patient's Home Medications on Admission:  Current Outpatient Medications:  .  acidophilus (RISAQUAD) CAPS capsule, Take 1 capsule by mouth daily., Disp: , Rfl:  .  amLODipine (NORVASC) 10 MG tablet, Take 10 mg by mouth daily., Disp: , Rfl: 0 .  aspirin EC 81 MG tablet, Take 81 mg by mouth daily., Disp: , Rfl:  .  BYETTA 10 MCG PEN 10 MCG/0.04ML SOPN injection, Inject 10 mcg into the skin 2 (two) times daily with a meal. , Disp: , Rfl: 0 .  finasteride (PROSCAR) 5 MG tablet, Take 5 mg by mouth daily., Disp: , Rfl: 0 .  glimepiride (AMARYL) 4 MG tablet, Take 4 mg by mouth daily with breakfast. , Disp: , Rfl: 0 .  guaiFENesin-codeine 100-10 MG/5ML syrup, Take 15 mLs by mouth every 4 (four) hours as needed for cough., Disp: 120 mL, Rfl: 0 .  ipratropium-albuterol (DUONEB) 0.5-2.5 (3) MG/3ML SOLN, Take 3 mLs by nebulization every 6 (six) hours., Disp: 360 mL, Rfl: 2 .  LEVEMIR FLEXTOUCH 100 UNIT/ML Pen, Inject 77 Units into the skin at bedtime. , Disp: , Rfl: 0 .  metFORMIN (GLUCOPHAGE) 1000 MG tablet, Take 1 tablet by mouth 2 (two) times daily., Disp: , Rfl: 0 .  naproxen (NAPROSYN) 500 MG tablet, Take 500 mg by mouth 2 (two) times daily with a meal., Disp: , Rfl:  .  omeprazole (PRILOSEC) 20 MG capsule, Take 20 mg by mouth daily., Disp: , Rfl: 0 .  oxybutynin (DITROPAN-XL) 10 MG 24 hr tablet, Take 10 mg by mouth daily., Disp: , Rfl: 0 .  polycarbophil (FIBERCON) 625 MG tablet, Take 625 mg by mouth every evening., Disp: , Rfl:   .  PROAIR HFA 108 (90 Base) MCG/ACT inhaler, Take 2 puffs by mouth every 4 (four) hours as needed for wheezing. , Disp: , Rfl: 0 .  quinapril (ACCUPRIL) 40 MG tablet, Take 40 mg by mouth daily., Disp: , Rfl: 0 .  simvastatin (ZOCOR) 20 MG tablet, Take 20 mg by mouth every evening., Disp: , Rfl: 0 .  SPIRIVA HANDIHALER 18 MCG inhalation capsule, Take 18 mcg by mouth daily. , Disp: , Rfl: 0 .  SYMBICORT 80-4.5 MCG/ACT inhaler, Take 2 puffs by mouth 2 (two) times daily., Disp: , Rfl: 0 .  tamsulosin (FLOMAX) 0.4 MG CAPS capsule, Take 1 capsule by mouth at bedtime. , Disp: , Rfl: 0 .  traZODone (DESYREL) 50 MG tablet, Take 50 mg by mouth at bedtime., Disp: , Rfl: 0  Past Medical History: Past Medical History:  Diagnosis Date  . Actinic keratoses   . BPH (benign prostatic hyperplasia)   . CKD stage 1 due to type 2 diabetes mellitus ([place])   . COPD (chronic obstructive pulmonary disease) ([place])   . Diabetes mellitus without complication ([place])   . GERD (gastroesophageal reflux disease)   . Hypertension   . Insomnia   . Iron deficiency anemia   . Mixed hyperlipidemia   . OSA (obstructive sleep apnea)     Tobacco  Use: Social History   Tobacco Use  Smoking Status Former Smoker  . Packs/day: 1.50  . Years: 50.00  . Pack years: 75.00  . Types: Cigarettes  . Last attempt to quit: 09/29/2008  . Years since quitting: 9.4  Smokeless Tobacco Never Used    Labs: Recent Review Scientist, physiological    Labs for ITP Cardiac and Pulmonary Rehab Latest Ref Rng & Units 10/29/2017 10/29/2017   Hemoglobin A1c 4.8 - 5.6 % 7.2(H) 7.2(H)       Pulmonary Assessment Scores: Pulmonary Assessment Scores    Row Name 11/30/17 1147 03/11/18 1138 03/21/18 1137     ADL UCSD   ADL Phase  Entry  Exit  Exit   SOB Score total  79  16  -   Rest  1  0  -   Walk  1  0  -   Stairs  5  4  -   Bath  2  0  -   Dress  3  0  -   Shop  4  0  -     CAT Score   CAT Score  26  12  -     mMRC Score   mMRC Score  1  -   0      Pulmonary Function Assessment: Pulmonary Function Assessment - 11/30/17 1038      Pulmonary Function Tests   FVC%  88 % Date performed 11/30/17 [facility]    FEV1%  48 %    FEV1/FVC Ratio  43      Breath   Bilateral Breath Sounds  Clear    Shortness of Breath  Yes;Fear of Shortness of Breath;Limiting activity;Panic with Shortness of Breath       Exercise Target Goals:    Exercise Program Goal: Individual exercise prescription set using results from initial 6 min walk test and THRR while considering  patient's activity barriers and safety.    Exercise Prescription Goal: Initial exercise prescription builds to 30-45 minutes a day of aerobic activity, 2-3 days per week.  Home exercise guidelines will be given to patient during program as part of exercise prescription that the participant will acknowledge.  Activity Barriers & Risk Stratification:   6 Minute Walk: 6 Minute Walk    Row Name 11/30/17 1141 03/21/18 1132       6 Minute Walk   Phase  Initial  Discharge    Distance  1350 feet  1700 feet    Distance % Change  -  26 %    Distance Feet Change  -  350 ft    Walk Time  6 minutes  6 minutes    # of Rest Breaks  0  0    MPH  2.55  3.21    METS  3.09  3.75    RPE  11  7    Perceived Dyspnea   2  0    VO2 Peak  10.84  13.14    Symptoms  No  No    Resting HR  96 bpm  92 bpm    Resting BP  100/56  120/70    Resting Oxygen Saturation   90 %  94 %    Exercise Oxygen Saturation  during 6 min walk  85 %  88 %    Max Ex. HR  126 bpm  118 bpm    Max Ex. BP  126/54  142/58    2 Minute Post BP  110/58  120/62      Interval HR   1 Minute HR  113  116    2 Minute HR  115  108    3 Minute HR  117  110    4 Minute HR  117  118    5 Minute HR  117  107    6 Minute HR  126  117    2 Minute Post HR  97  93    Interval Heart Rate?  Yes  Yes      Interval Oxygen   Interval Oxygen?  Yes  Yes    Baseline Oxygen Saturation %  90 %  94 %    1 Minute Oxygen Saturation %   87 %  93 %    1 Minute Liters of Oxygen  0 L  0 L    2 Minute Oxygen Saturation %  85 %  91 %    2 Minute Liters of Oxygen  0 L  0 L    3 Minute Oxygen Saturation %  85 %  92 %    3 Minute Liters of Oxygen  0 L  0 L    4 Minute Oxygen Saturation %  86 %  88 %    4 Minute Liters of Oxygen  0 L  0 L    5 Minute Oxygen Saturation %  87 %  89 %    5 Minute Liters of Oxygen  0 L  0 L    6 Minute Oxygen Saturation %  86 %  89 %    6 Minute Liters of Oxygen  0 L  0 L    2 Minute Post Oxygen Saturation %  93 %  96 %    2 Minute Post Liters of Oxygen  0 L  0 L      Oxygen Initial Assessment: Oxygen Initial Assessment - 11/30/17 1046      Home Oxygen   Home Oxygen Device  Home Concentrator;E-Tanks    Sleep Oxygen Prescription  Continuous    Liters per minute  2    Home Exercise Oxygen Prescription  None    Home at Rest Exercise Oxygen Prescription  None    Compliance with Home Oxygen Use  Yes      Initial 6 min Walk   Oxygen Used  None      Program Oxygen Prescription   Program Oxygen Prescription  None      Intervention   Short Term Goals  To learn and exhibit compliance with exercise, home and travel O2 prescription;To learn and understand importance of maintaining oxygen saturations>88%;To learn and demonstrate proper use of respiratory medications;To learn and understand importance of monitoring SPO2 with pulse oximeter and demonstrate accurate use of the pulse oximeter.;To learn and demonstrate proper pursed lip breathing techniques or other breathing techniques.    Long  Term Goals  Exhibits compliance with exercise, home and travel O2 prescription;Verbalizes importance of monitoring SPO2 with pulse oximeter and return demonstration;Maintenance of O2 saturations>88%;Exhibits proper breathing techniques, such as pursed lip breathing or other method taught during program session;Compliance with respiratory medication;Demonstrates proper use of MDI's       Oxygen  Re-Evaluation: Oxygen Re-Evaluation    Row Name 12/10/17 1039 01/12/18 1048 02/04/18 1114 03/09/18 1419       Program Oxygen Prescription   Program Oxygen Prescription  None  None  None  None      Home Oxygen   Home Oxygen Device  Home Concentrator;E-Tanks  Home Concentrator;E-Tanks  Home Concentrator;E-Tanks  Home Concentrator;E-Tanks    Sleep Oxygen Prescription  Continuous  Continuous  Continuous  Continuous    Liters per minute  '2  2  2  2    '$ Home Exercise Oxygen Prescription  None  None  None  None    Home at Rest Exercise Oxygen Prescription  None  None  None  None    Compliance with Home Oxygen Use  Yes  Yes  Yes  Yes      Goals/Expected Outcomes   Short Term Goals  To learn and exhibit compliance with exercise, home and travel O2 prescription;To learn and understand importance of maintaining oxygen saturations>88%;To learn and demonstrate proper use of respiratory medications;To learn and understand importance of monitoring SPO2 with pulse oximeter and demonstrate accurate use of the pulse oximeter.;To learn and demonstrate proper pursed lip breathing techniques or other breathing techniques.  To learn and exhibit compliance with exercise, home and travel O2 prescription;To learn and understand importance of maintaining oxygen saturations>88%;To learn and demonstrate proper use of respiratory medications;To learn and understand importance of monitoring SPO2 with pulse oximeter and demonstrate accurate use of the pulse oximeter.;To learn and demonstrate proper pursed lip breathing techniques or other breathing techniques.  To learn and exhibit compliance with exercise, home and travel O2 prescription;To learn and understand importance of maintaining oxygen saturations>88%;To learn and demonstrate proper use of respiratory medications;To learn and understand importance of monitoring SPO2 with pulse oximeter and demonstrate accurate use of the pulse oximeter.;To learn and demonstrate proper  pursed lip breathing techniques or other breathing techniques.  To learn and exhibit compliance with exercise, home and travel O2 prescription;To learn and understand importance of maintaining oxygen saturations>88%;To learn and demonstrate proper use of respiratory medications;To learn and understand importance of monitoring SPO2 with pulse oximeter and demonstrate accurate use of the pulse oximeter.;To learn and demonstrate proper pursed lip breathing techniques or other breathing techniques.    Long  Term Goals  Exhibits compliance with exercise, home and travel O2 prescription;Verbalizes importance of monitoring SPO2 with pulse oximeter and return demonstration;Maintenance of O2 saturations>88%;Exhibits proper breathing techniques, such as pursed lip breathing or other method taught during program session;Compliance with respiratory medication;Demonstrates proper use of MDI's  Exhibits compliance with exercise, home and travel O2 prescription;Verbalizes importance of monitoring SPO2 with pulse oximeter and return demonstration;Maintenance of O2 saturations>88%;Exhibits proper breathing techniques, such as pursed lip breathing or other method taught during program session;Compliance with respiratory medication;Demonstrates proper use of MDI's  Exhibits compliance with exercise, home and travel O2 prescription;Verbalizes importance of monitoring SPO2 with pulse oximeter and return demonstration;Maintenance of O2 saturations>88%;Exhibits proper breathing techniques, such as pursed lip breathing or other method taught during program session;Compliance with respiratory medication;Demonstrates proper use of MDI's  Exhibits compliance with exercise, home and travel O2 prescription;Verbalizes importance of monitoring SPO2 with pulse oximeter and return demonstration;Maintenance of O2 saturations>88%;Exhibits proper breathing techniques, such as pursed lip breathing or other method taught during program  session;Compliance with respiratory medication;Demonstrates proper use of MDI's    Comments  Reviewed PLB technique with pt.  Talked about how it work and it's important to maintaining his exercise saturations.    Billy Wise feels like his breathing has got alot better since the start of the program. He takes his albuterol treatments at least once a day. He practices with his incentive spirometer at times.  He uses a spacer for his inhalers and uses them independently.  He has an  appointment with Dr. Ashby Dawes on Monday to review his CT Scans and PFTs from this past week.  He is hoping that he is not progressing too quickly.  He has been using his medications correctly and feels that they are working well for him,     Billy Wise has been compliant with his oxygen use at home.  He continues to monitor his saturations at home daily.  He is also doing well with his PLB and has found it to be very helpful with his breathing.   His progression is ongoing but overall he is dong well.     Goals/Expected Outcomes  Short: Become more profiecient at using PLB.   Long: Become independent at using PLB.  Short: continue to increase his exercise load. Long: independently increase walking speed as tolerated keeping oxygen above 88 percent  Short: Meet with doctor.  Long: Continue to manage his COPD independently.   Short: Continue to monitor his oxygen levels at home. Long: Continue to manage his COPD.        Oxygen Discharge (Final Oxygen Re-Evaluation): Oxygen Re-Evaluation - 03/09/18 1419      Program Oxygen Prescription   Program Oxygen Prescription  None      Home Oxygen   Home Oxygen Device  Home Concentrator;E-Tanks    Sleep Oxygen Prescription  Continuous    Liters per minute  2    Home Exercise Oxygen Prescription  None    Home at Rest Exercise Oxygen Prescription  None    Compliance with Home Oxygen Use  Yes      Goals/Expected Outcomes   Short Term Goals  To learn and exhibit compliance with exercise,  home and travel O2 prescription;To learn and understand importance of maintaining oxygen saturations>88%;To learn and demonstrate proper use of respiratory medications;To learn and understand importance of monitoring SPO2 with pulse oximeter and demonstrate accurate use of the pulse oximeter.;To learn and demonstrate proper pursed lip breathing techniques or other breathing techniques.    Long  Term Goals  Exhibits compliance with exercise, home and travel O2 prescription;Verbalizes importance of monitoring SPO2 with pulse oximeter and return demonstration;Maintenance of O2 saturations>88%;Exhibits proper breathing techniques, such as pursed lip breathing or other method taught during program session;Compliance with respiratory medication;Demonstrates proper use of MDI's    Comments  Billy Wise has been compliant with his oxygen use at home.  He continues to monitor his saturations at home daily.  He is also doing well with his PLB and has found it to be very helpful with his breathing.   His progression is ongoing but overall he is dong well.     Goals/Expected Outcomes  Short: Continue to monitor his oxygen levels at home. Long: Continue to manage his COPD.        Initial Exercise Prescription: Initial Exercise Prescription - 11/30/17 1100      Date of Initial Exercise RX and Referring Provider   Date  11/30/17    Referring Provider  Ramachandran      Treadmill   MPH  2.5    Grade  0.5    Minutes  15    METs  3      Recumbant Bike   Level  3    RPM  60    Watts  28    Minutes  15    METs  3      Arm Ergometer   Level  2    Watts  44    RPM  40  Minutes  15    METs  3      Recumbant Elliptical   Level  2    RPM  60    Minutes  15    METs  3      REL-XR   Level  2    Speed  50    Minutes  15    METs  3      Prescription Details   Frequency (times per week)  3    Duration  Progress to 45 minutes of aerobic exercise without signs/symptoms of physical distress       Intensity   THRR 40-80% of Max Heartrate  118-140    Ratings of Perceived Exertion  11-15    Perceived Dyspnea  0-4      Resistance Training   Training Prescription  Yes    Weight  3 lb    Reps  10-15       Perform Capillary Blood Glucose checks as needed.  Exercise Prescription Changes: Exercise Prescription Changes    Row Name 12/15/17 1200 12/24/17 1000 12/29/17 1200 01/12/18 1200 01/26/18 1200     Response to Exercise   Blood Pressure (Admit)  132/64  -  118/60  124/64  120/70   Blood Pressure (Exercise)  134/66  -  -  -  -   Blood Pressure (Exit)  116/58  -  124/70  136/60  106/72   Heart Rate (Admit)  111 bpm  -  93 bpm  108 bpm  96 bpm   Heart Rate (Exercise)  123 bpm  -  122 bpm  118 bpm  132 bpm   Heart Rate (Exit)  100 bpm  -  104 bpm  98 bpm  114 bpm   Oxygen Saturation (Admit)  90 %  -  91 %  87 %  92 %   Oxygen Saturation (Exercise)  90 %  -  88 %  90 %  88 %   Oxygen Saturation (Exit)  89 %  -  91 %  92 %  89 %   Rating of Perceived Exertion (Exercise)  12  -  '13  12  12   '$ Perceived Dyspnea (Exercise)  1  -  1  1  0   Symptoms  none  -  none  noone  none   Duration  Continue with 45 min of aerobic exercise without signs/symptoms of physical distress.  -  Continue with 45 min of aerobic exercise without signs/symptoms of physical distress.  Continue with 45 min of aerobic exercise without signs/symptoms of physical distress.  Continue with 45 min of aerobic exercise without signs/symptoms of physical distress.   Intensity  THRR unchanged  -  THRR unchanged  THRR unchanged  THRR unchanged     Progression   Progression  Continue to progress workloads to maintain intensity without signs/symptoms of physical distress.  Continue to progress workloads to maintain intensity without signs/symptoms of physical distress.  Continue to progress workloads to maintain intensity without signs/symptoms of physical distress.  Continue to progress workloads to maintain intensity without  signs/symptoms of physical distress.  Continue to progress workloads to maintain intensity without signs/symptoms of physical distress.   Average METs  2.9  2.9  3.3  2.7  3.67     Resistance Training   Training Prescription  Yes  Yes  Yes  Yes  Yes   Weight  3 lb  3 lb  3 lb  7  lb  7 lb   Reps  10-15  10-15  10-15  10-15  10-15     Interval Training   Interval Training  No  No  No  No  No     Treadmill   MPH  2.5  2.5  2.5  -  3   Grade  '2  2  2  '$ -  3   Minutes  '15  15  15  '$ -  15   METs  3.6  3.6  3.6  -  4.54     Recumbant Bike   Level  -  -  3  4  -   RPM  -  -  70  75  -   Watts  -  -  27  32  -   Minutes  -  -  15  15  -   METs  -  -  3  3  -     T5 Nustep   Level  2  2  -  4  4   SPM  80  80  -  87  79   Minutes  15  15  -  15  15   METs  2.2  2.2  -  2.4  2.8     Home Exercise Plan   Plans to continue exercise at  -  [place]   Frequency  -  Add 1 additional day to program exercise sessions. walking at home  -  -  Add 1 additional day to program exercise sessions. walking at home   Initial Home Exercises Provided  -  12/24/17  -  -  12/24/17   Row Name 02/09/18 1400 02/23/18 1200 03/09/18 1100 03/21/18 1600       Response to Exercise   Blood Pressure (Admit)  124/64  118/60  118/60  120/70    Blood Pressure (Exit)  134/72  120/60  126/64  118/58    Heart Rate (Admit)  99 bpm  101 bpm  121 bpm  96 bpm    Heart Rate (Exercise)  136 bpm  127 bpm  131 bpm  116 bpm    Heart Rate (Exit)  107 bpm  107 bpm  101 bpm  95 bpm    Oxygen Saturation (Admit)  91 %  90 %  92 %  92 %    Oxygen Saturation (Exercise)  89 %  85 %  88 %  93 %    Oxygen Saturation (Exit)  91 %  92 %  92 %  88 %    Rating of Perceived Exertion (Exercise)  '12  12  12  12    '$ Perceived Dyspnea (Exercise)  0  1  0  0    Symptoms  none  none  none  none    Duration  Continue with 45 min of aerobic exercise without signs/symptoms of physical distress.  Continue with 45 min of aerobic  exercise without signs/symptoms of physical distress.  Continue with 45 min of aerobic exercise without signs/symptoms of physical distress.  Continue with 45 min of aerobic exercise without signs/symptoms of physical distress.    Intensity  THRR unchanged  THRR unchanged  THRR unchanged  THRR unchanged      Progression   Progression  Continue to progress workloads to maintain intensity without signs/symptoms of physical distress.  Continue to progress workloads to maintain intensity without  signs/symptoms of physical distress.  Continue to progress workloads to maintain intensity without signs/symptoms of physical distress.  Continue to progress workloads to maintain intensity without signs/symptoms of physical distress.    Average METs  3.67  3.73  3.1  -      Resistance Training   Training Prescription  Yes  Yes  Yes  Yes    Weight  7 lb  7 lb  7 lb  7 lb    Reps  10-15  10-15  10-15  10-15      Interval Training   Interval Training  No  No  No  No      Treadmill   MPH  3  3  -  -    Grade  3  4  -  -    Minutes  15  15  -  -    METs  4.54  4.95  -  -      Recumbant Bike   Level  -  -  6  8    Watts  -  -  40  -    Minutes  -  -  15  15    METs  -  -  3.6  -      T5 Nustep   Level  '5  6  6  '$ -    SPM  81  89  88  -    Minutes  '15  15  15  '$ -    METs  2.8  2.5  2.6  -      Home Exercise Plan   Plans to continue exercise at  [employer]  -    Frequency  Add 1 additional day to program exercise sessions. walking at home  Add 1 additional day to program exercise sessions. walking at home  Add 1 additional day to program exercise sessions. walking at home  -    Initial Home Exercises Provided  12/24/17  12/24/17  12/24/17  -       Exercise Comments: Exercise Comments    Row Name 12/10/17 1038 12/24/17 1109 03/23/18 0953       Exercise Comments   First full day of exercise!  Patient was oriented to gym and equipment including functions, settings,  policies, and procedures.  Patient's individual exercise prescription and treatment plan were reviewed.  All starting workloads were established based on the results of the 6 minute walk test done at initial orientation visit.  The plan for exercise progression was also introduced and progression will be customized based on patient's performance and goals  Home exercise reviewed with patient. Handout given and signed. Reviewed target heart rate, RPE, dyspnea scale, and safety. Patient verbalized understanding. He will add an extra day of exercise at home. He is planning on attending [facility] today from  rehab with 36 sessions completed.  Details of the patient's exercise prescription and what He needs to do in order to continue the prescription and progress were discussed with patient.  Patient was given a copy of prescription and goals.  Patient verbalized understanding.  Tarrin plans to continue to exercise by joining the [facility].        Exercise Goals and Review: Exercise Goals    Row Name 11/30/17 1158             Exercise Goals   Increase Physical Activity  Yes  Intervention  Provide advice, education, support and counseling about physical activity/exercise needs.;Develop an individualized exercise prescription for aerobic and resistive training based on initial evaluation findings, risk stratification, comorbidities and participant's personal goals.       Expected Outcomes  Short Term: Attend rehab on a regular basis to increase amount of physical activity.;Long Term: Add in home exercise to make exercise part of routine and to increase amount of physical activity.;Long Term: Exercising regularly at least 3-5 days a week.       Increase Strength and Stamina  Yes       Intervention  Provide advice, education, support and counseling about physical activity/exercise needs.;Develop an individualized exercise prescription for aerobic and resistive training based on  initial evaluation findings, risk stratification, comorbidities and participant's personal goals.       Expected Outcomes  Short Term: Increase workloads from initial exercise prescription for resistance, speed, and METs.;Short Term: Perform resistance training exercises routinely during rehab and add in resistance training at home;Long Term: Improve cardiorespiratory fitness, muscular endurance and strength as measured by increased METs and functional capacity (6MWT)       Able to understand and use rate of perceived exertion (RPE) scale  Yes       Intervention  Provide education and explanation on how to use RPE scale       Expected Outcomes  Short Term: Able to use RPE daily in rehab to express subjective intensity level;Long Term:  Able to use RPE to guide intensity level when exercising independently       Able to understand and use Dyspnea scale  Yes       Intervention  Provide education and explanation on how to use Dyspnea scale       Expected Outcomes  Short Term: Able to use Dyspnea scale daily in rehab to express subjective sense of shortness of breath during exertion;Long Term: Able to use Dyspnea scale to guide intensity level when exercising independently       Knowledge and understanding of Target Heart Rate Range (THRR)  Yes       Intervention  Provide education and explanation of THRR including how the numbers were predicted and where they are located for reference       Expected Outcomes  Short Term: Able to state/look up THRR;Long Term: Able to use THRR to govern intensity when exercising independently;Short Term: Able to use daily as guideline for intensity in rehab       Able to check pulse independently  Yes       Intervention  Provide education and demonstration on how to check pulse in carotid and radial arteries.;Review the importance of being able to check your own pulse for safety during independent exercise       Expected Outcomes  Short Term: Able to explain why pulse  checking is important during independent exercise;Long Term: Able to check pulse independently and accurately       Understanding of Exercise Prescription  Yes       Intervention  Provide education, explanation, and written materials on patient's individual exercise prescription       Expected Outcomes  Short Term: Able to explain program exercise prescription;Long Term: Able to explain home exercise prescription to exercise independently          Exercise Goals Re-Evaluation : Exercise Goals Re-Evaluation    Row Name 12/10/17 1038 12/15/17 1241 12/24/17 1056 12/29/17 1302 01/12/18 1249     Exercise Goal Re-Evaluation   Exercise Goals  Review  Understanding of Exercise Prescription;Able to understand and use Dyspnea scale;Knowledge and understanding of Target Heart Rate Range (THRR);Able to understand and use rate of perceived exertion (RPE) scale  Increase Physical Activity;Increase Strength and Stamina;Able to understand and use Dyspnea scale;Able to understand and use rate of perceived exertion (RPE) scale  Increase Physical Activity;Increase Strength and Stamina;Able to understand and use Dyspnea scale;Able to understand and use rate of perceived exertion (RPE) scale;Understanding of Exercise Prescription;Knowledge and understanding of Target Heart Rate Range (THRR);Able to check pulse independently  Increase Physical Activity;Increase Strength and Stamina;Able to understand and use Dyspnea scale;Able to understand and use rate of perceived exertion (RPE) scale  Increase Physical Activity;Able to understand and use rate of perceived exertion (RPE) scale;Increase Strength and Stamina;Able to understand and use Dyspnea scale;Knowledge and understanding of Target Heart Rate Range (THRR);Understanding of Exercise Prescription   Comments  Reviewed RPE scale, THR and program prescription with pt today.  Pt voiced understanding and was given a copy of goals to take home.   Billy Wise has tolerated exercise well  so far.  He is working at a higher incline on [employer] exercise done with patient. Handout given and signed.  Pt is progressing well - average MET level is up to 3.3  Staff will continue to monitor  Billy Wise is progressing well and has moved up his DB weight and intensity on . Staff will continute to monitor   Expected Outcomes  Short: Use RPE daily to regulate intensity.  Long: Follow program prescription in THR.  Short - Billy Wise will continue to attend regularly Long - Billy Wise will improve overall MET level  Short: add one extra day of exercise outside of the program. Long: become indpendent with exercise after graduation  Short - Pt will attend regularly Long - Pt will continue to increase MET level  Short - Billy Wise will attend regularly and exercise at home  [place] will maintain gains on his own   [place] Name 01/26/18 1248 02/04/18 1059 02/09/18 1458 02/23/18 1233 03/09/18 1133     Exercise Goal Re-Evaluation   Exercise Goals Review  Increase Physical Activity;Able to understand and use rate of perceived exertion (RPE) scale;Knowledge and understanding of Target Heart Rate Range (THRR);Able to understand and use Dyspnea scale;Increase Strength and Stamina  Increase Physical Activity;Understanding of Exercise Prescription;Increase Strength and Stamina  Increase Physical Activity;Able to understand and use rate of perceived exertion (RPE) scale;Knowledge and understanding of Target Heart Rate Range (THRR);Increase Strength and Stamina;Able to understand and use Dyspnea scale  Increase Physical Activity;Able to understand and use rate of perceived exertion (RPE) scale;Increase Strength and Stamina;Able to understand and use Dyspnea scale;Knowledge and understanding of Target Heart Rate Range (THRR)  Increase Physical Activity;Increase Strength and Stamina;Able to understand and use Dyspnea scale;Able to understand and use rate of perceived exertion (RPE) scale   Comments  Billy Wise has increased overall MET level.   Staff will monitor progress and suggest interval training  Billy Wise has been doing well in rehab. He is doing his home exercise by doing the treadmill and weights at home.  He did 77mn on the treadmill yesterday!!  He has been feeling stronger and has more stamina.   SRichardson Landryis maintaining his current MET level of 3.76.  His O2 levels are staying at 89-90 during exercise so no further increases would be appropriate at this time.    SRichardson Landryhas progressed well with exercise.  Staff will monitor TM intensity to keep 02 above  88%  Billy Wise returned today after missing 2 weeks due to illness and family illness.  He was able to pick up same level as before on machines.    Expected Outcomes  Short - Billy Wise will attend regularly Long - Billy Wise will exercise on his own  Short: Continue to use treadmill on off days.  Long: Continue to increase strength and stamina.   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  He has already gotten set up with [facility] and will be able to start there when he is ready.  He feels that he has made 100% improvement in his strength and stamina and it breathing better too.  Billy Wise has been very happy with his results from exercise and the program.    Billy Wise graduates next session.  He improved walk  test by 350 feet or 26 %      Expected Outcomes  Short: Improve post 6MWT and graduate.  Long: Continue to exercise on his own.   Pt will maintain fitness on his own.         Discharge Exercise Prescription (Final Exercise Prescription Changes): Exercise Prescription Changes - 03/21/18 1600      Response to Exercise   Blood Pressure (Admit)  120/70    Blood Pressure (Exit)  118/58    Heart Rate (Admit)  96 bpm    Heart Rate (Exercise)  116 bpm    Heart Rate (Exit)  95 bpm    Oxygen Saturation (Admit)  92 %    Oxygen Saturation (Exercise)  93 %    Oxygen Saturation (Exit)  88 %    Rating of Perceived Exertion (Exercise)  12    Perceived Dyspnea (Exercise)  0    Symptoms  none    Duration  Continue with 45 min of aerobic exercise without signs/symptoms of physical distress.    Intensity  THRR unchanged      Progression   Progression  Continue to progress workloads to maintain intensity without signs/symptoms of physical distress.      Resistance Training   Training Prescription  Yes    Weight  7 lb    Reps  10-15      Interval Training   Interval Training  No      Recumbant Bike   Level  8    Minutes  15       Nutrition:  Target Goals: Understanding of nutrition guidelines, daily intake of sodium '1500mg'$ , cholesterol '200mg'$ , calories 30% from fat and 7% or less from saturated fats, daily to have 5 or more servings of fruits and vegetables.  Biometrics: Pre Biometrics - 11/30/17 1144      Pre Biometrics   Height  5'  5.25" (1.657 m)    Weight  179 lb 6.4 oz (81.4 kg)    Waist Circumference  41.5 inches    Hip Circumference  42 inches    Waist to Hip Ratio  0.99 %    BMI (Calculated)  29.64      Post Biometrics - 03/21/18 1135       Post  Biometrics   Height  5' 5.25" (1.657 m)    Weight  181 lb (82.1 kg)    Waist Circumference  41.25 inches    Hip Circumference  39.75 inches    Waist to Hip Ratio  1.04 %    BMI (Calculated)  29.9       Nutrition Therapy  Plan and Nutrition Goals: Nutrition Therapy & Goals - 11/30/17 1037      Personal Nutrition Goals   Comments  eat healthier and lose some wieght.      Intervention Plan   Intervention  Prescribe, educate and counsel regarding individualized specific dietary modifications aiming towards targeted core components such as weight, hypertension, lipid management, diabetes, heart failure and other comorbidities.;Nutrition handout(s) given to patient.    Expected Outcomes  Short Term Goal: Understand basic principles of dietary content, such as calories, fat, sodium, cholesterol and nutrients.;Short Term Goal: A plan has been developed with personal nutrition goals set during dietitian appointment.;Long Term Goal: Adherence to prescribed nutrition plan.       Nutrition Assessments: Nutrition Assessments - 03/11/18 1141      MEDFICTS Scores   Pre Score  108    Post Score  66    Score Difference  -42       Nutrition Goals Re-Evaluation: Nutrition Goals Re-Evaluation    Row Name 12/15/17 1028 01/12/18 1054 02/04/18 1109 03/09/18 1408       Goals   Current Weight  176 lb (79.8 kg)  178 lb (80.7 kg)  181 lb (82.1 kg)  180 lb (81.6 kg)    Nutrition Goal  Lose some weight. Eat as healthy as he can.  Still lose weight  Still lose weight  Meet with nutritionist.   Weight loss    Comment  He would like to lose some more weight to help him breath. He does not want to meet with the dietician since he has seen a dietician for his diabetes.  Some days he is eating what he wants. He says he will eat a hamburger, steak or fries at least once a week. He says sometimes he gets really full and sometimes he doesn't.   He has been induling in some ice cream.  He has not meet with nutritionist and has appointmnent scheduled for 4/29 during class.   He is eating, but could use some tweaks.   Billy Wise has been working towards losing weight.  He has cut back on his portions.  He is eating around 6 small meals a day, but  some of the "snacks" are not always healthy.  He knows what to do, but says that he does not want to give up everything.  He also struggles with eating on the go a out due to his schedule.     Expected Outcome  Short: lose 5 more pounds. Long: maintain weight Loss.  Short: lose 5 more pounds. Long: maintain weight Loss.  Short: Meet with dietician.  Long: Continue to work on weight loss.   Short: Continue to work on portion control and healthy snacks.  Long: Continue to work on weight loss.  Nutrition Goals Discharge (Final Nutrition Goals Re-Evaluation): Nutrition Goals Re-Evaluation - 03/09/18 1408      Goals   Current Weight  180 lb (81.6 kg)    Nutrition Goal  Weight loss    Comment  Billy Wise has been working towards losing weight.  He has cut back on his portions.  He is eating around 6 small meals a day, but some of the "snacks" are not always healthy.  He knows what to do, but says that he does not want to give up everything.  He also struggles with eating on the go a out due to his schedule.     Expected Outcome  Short: Continue to work on portion control and healthy snacks.  Long: Continue to work on weight loss.        Psychosocial: Target Goals: Acknowledge presence or absence of significant depression and/or stress, maximize coping skills, provide positive support system. Participant is able to verbalize types and ability to use techniques and skills needed for reducing stress and depression.   Initial Review & Psychosocial Screening: Initial Psych Review & Screening - 11/30/17 1032      Initial Review   Current issues with  Current Sleep Concerns;Current Stress Concerns    Source of Stress Concerns  Chronic Illness;Unable to perform yard/household activities;Unable to participate in former interests or hobbies    Comments  His yard work got to be too much for him, he cant swim like he wants to. He lost 10 pounds recently and has not exercised in many years.      Family  Dynamics   Good Support System?  Yes    Comments  He looks to his wife and his daughter inlaw for support      Barriers   Psychosocial barriers to participate in program  The patient should benefit from training in stress management and relaxation.      Screening Interventions   Interventions  Program counselor consult;Encouraged to exercise;Provide feedback about the scores to participant;To provide support and resources with identified psychosocial needs    Expected Outcomes  Short Term goal: Utilizing psychosocial counselor, staff and physician to assist with identification of specific Stressors or current issues interfering with healing process. Setting desired goal for each stressor or current issue identified.;Long Term goal: The participant improves quality of Life and PHQ9 Scores as seen by post scores and/or verbalization of changes;Short Term goal: Identification and review with participant of any Quality of Life or Depression concerns found by scoring the questionnaire.;Long Term Goal: Stressors or current issues are controlled or eliminated.       Quality of Life Scores:  Scores of 19 and below usually indicate a poorer quality of life in these areas.  A difference of  2-3 points is a clinically meaningful difference.  A difference of 2-3 points in the total score of the Quality of Life Index has been associated with significant improvement in overall quality of life, self-image, physical symptoms, and general health in studies assessing change in quality of life.  PHQ-9: Recent Review Flowsheet Data    Depression screen [facility] 2/9 03/11/2018 12/13/2017 11/30/2017   Decreased Interest 0 3 3   Down, Depressed, Hopeless 0 0 0   PHQ - 2 Score 0 3 3   Altered sleeping 0 1 3   Tired, decreased energy '1 3 3   '$ Change in appetite 0 0 0   Feeling bad or failure about yourself  0 0 0   Trouble concentrating 0  0 0   Moving slowly or fidgety/restless 0 0 0   Suicidal thoughts 0 0 0   PHQ-9  Score '1 7 9   '$ Difficult doing work/chores Not difficult at all Not difficult at all Not difficult at all     Interpretation of Total Score  Total Score Depression Severity:  1-4 = Minimal depression, 5-9 = Mild depression, 10-14 = Moderate depression, 15-19 = Moderately severe depression, 20-27 = Severe depression   Psychosocial Evaluation and Intervention: Psychosocial Evaluation - 12/13/17 1115      Psychosocial Evaluation & Interventions   Interventions  Stress management education;Relaxation education;Encouraged to exercise with the program and follow exercise prescription    Comments  Counselor met with Billy Wise today for initial psychosocial evaluation.  He is a 72 year old who has COPD and was hospitalized recently for flu and pneumonia.  He has a strong support system with a spouse; several sons and their families; and active involvement in his local church.  Billy Wise reports his sleep has improved just recently with a change of medication back to Ambien since the Trazodone was not helpful.  He has a good appetite and denies a history of depression or anxiety - or any current symptoms.  Billy Wise states he is typically in a positive mood and has minimal stress in his life - although his spouse is scheduled for surgery this Friday and he is concerned about that.  He has goals for this program to help him breathe better; to reduce the use of Oxygen; to improve his quality of breathing and to exercise consistently.  Staff will follow with Jermar throughout the course of this program.      Expected Outcomes  Sam will benefit from consistent exercise to achieve his stated goals.  The educational and psychoeducational components of this program will help him understand; manage and cope better with life in general.  Staff will follow.     Continue Psychosocial Services   Follow up required by staff       Psychosocial Re-Evaluation: Psychosocial Re-Evaluation    [place] Name 12/15/17 1031 01/12/18  1058 02/04/18 1111 02/09/18 1033 03/09/18 1414     Psychosocial Re-Evaluation   Current issues with  Current Sleep Concerns;Current Stress Concerns  Current Sleep Concerns;Current Stress Concerns  Current Sleep Concerns;Current Stress Concerns  -  Current Sleep Concerns;Current Stress Concerns   Comments  Billy Wise has been sleeping better. His oxygen and a wedge pillow has helped him sleep. He has a sleep number bed coming this week. Exercising in rehab has helped him sleep better.  Namon states his sleeping has got a little better since last time. His attitude is positive and says the program has helped him breath better.  Billy Wise continues to sleep better!  Exercise has helped him feel a lot better.  He remains postive.  He has an appointment with Dr. Ashby Dawes on Monday to review his CT Scans and PFTs from this past week.  He is hoping that he is not progressing too quickly.  Counselor follow up with Billy Wise today reporting feeling stronger and more energetic since coming into this program.  He continues to have some stress with his spouse's health but he is coping better with exercise.  Billy Wise is sleeping better and reports he continues to need the O2 but his levels are better during the day now.  counselor commended Billy Wise for his progress made so far.  Billy Wise has done well in the program.  He has been out  the last two weeks. He was out sick for a week and then lost his mother.  He has come to terms with his mother's death as she was 25 years old.  He continues to sleep well and is breathing much better than when he started the program.  He has enjoyed the program and has found the best part to be meeting new people and having the staff around.  He feels that he has learned a lot from class.    Expected Outcomes  Short: use his pillow and new bed to sleep better. Long: maintain a good sleep pattern and be compliant with equipment.  Short: continue exercise to reduce stress. Long: independently exercise to  keep stress to a miminum.  Short: Meet with doctor to learn about progression of disease. Long: Continue to remain positive.   Short:  Continue to stay positive re: his spouse's health.   Long:  Continue to exercise consistently for overall health and breathing better.   Short: Continue to cope well.  Long: Continue to stay postive overall.    Interventions  Encouraged to attend Pulmonary Rehabilitation for the exercise  Encouraged to attend Pulmonary Rehabilitation for the exercise  Encouraged to attend Pulmonary Rehabilitation for the exercise  Stress management education;Relaxation education  Stress management education;Relaxation education   Continue Psychosocial Services   Follow up required by staff  Follow up required by staff  Follow up required by staff  Follow up required by staff  Follow up required by staff     Initial Review   Source of Stress Concerns  -  -  -  -  Chronic Illness;Unable to perform yard/household activities;Unable to participate in former interests or hobbies      Psychosocial Discharge (Final Psychosocial Re-Evaluation): Psychosocial Re-Evaluation - 03/09/18 1414      Psychosocial Re-Evaluation   Current issues with  Current Sleep Concerns;Current Stress Concerns    Comments  Billy Wise has done well in the program.  He has been out the last two weeks. He was out sick for a week and then lost his mother.  He has come to terms with his mother's death as she was 42 years old.  He continues to sleep well and is breathing much better than when he started the program.  He has enjoyed the program and has found the best part to be meeting new people and having the staff around.  He feels that he has learned a lot from class.     Expected Outcomes  Short: Continue to cope well.  Long: Continue to stay postive overall.     Interventions  Stress management education;Relaxation education    Continue Psychosocial Services   Follow up required by staff      Initial Review   Source of  Stress Concerns  Chronic Illness;Unable to perform yard/household activities;Unable to participate in former interests or hobbies       Education: Education Goals: Education classes will be provided on a weekly basis, covering required topics. Participant will state understanding/return demonstration of topics presented.  Learning Barriers/Preferences: Learning Barriers/Preferences - 11/30/17 1039      Learning Barriers/Preferences   Learning Barriers  Sight wears glasses    Learning Preferences  None       Education Topics:  Initial Evaluation Education: - Verbal, written and demonstration of respiratory meds, oximetry and breathing techniques. Instruction on use of nebulizers and MDIs and importance of monitoring MDI activations.   Pulmonary Rehab from 03/23/2018 in [facility] Cardiac  and Pulmonary Rehab  Date  11/30/17  Educator  [facility]  Instruction Review Code  1- Verbalizes Understanding      General Nutrition Guidelines/Fats and Fiber: -Group instruction provided by verbal, written material, models and posters to present the general guidelines for heart healthy nutrition. Gives an explanation and review of dietary fats and fiber.   Pulmonary Rehab from 03/23/2018 in [facility] Cardiac and Pulmonary Rehab  Date  01/10/18  Educator  CR  Instruction Review Code  1- Verbalizes Understanding      Controlling Sodium/Reading Food Labels: -Group verbal and written material supporting the discussion of sodium use in heart healthy nutrition. Review and explanation with models, verbal and written materials for utilization of the food label.   Pulmonary Rehab from 03/23/2018 in [facility] Cardiac and Pulmonary Rehab  Date  03/21/18  Educator  CR  Instruction Review Code  5- Refused Teaching      Exercise Physiology & General Exercise Guidelines: - Group verbal and written instruction with models to review the exercise physiology of the cardiovascular system and associated critical values. Provides  general exercise guidelines with specific guidelines to those with heart or lung disease.    Pulmonary Rehab from 03/23/2018 in [facility] Cardiac and Pulmonary Rehab  Date  01/26/18  Educator  [facility]  Instruction Review Code  1- Verbalizes Understanding      Aerobic Exercise & Resistance Training: - Gives group verbal and written instruction on the various components of exercise. Focuses on aerobic and resistive training programs and the benefits of this training and how to safely progress through these programs.   Flexibility, Balance, Mind/Body Relaxation: Provides group verbal/written instruction on the benefits of flexibility and balance training, including mind/body exercise modes such as yoga, pilates and tai chi.  Demonstration and skill practice provided.   Pulmonary Rehab from 03/23/2018 in [facility] Cardiac and Pulmonary Rehab  Date  02/23/18  Educator  AS  Instruction Review Code  1- Verbalizes Understanding      Stress and Anxiety: - Provides group verbal and written instruction about the health risks of elevated stress and causes of high stress.  Discuss the correlation between heart/lung disease and anxiety and treatment options. Review healthy ways to manage with stress and anxiety.   Pulmonary Rehab from 03/23/2018 in [facility] Cardiac and Pulmonary Rehab  Date  03/16/18  Educator  [facility]  Instruction Review Code  1- Verbalizes Understanding      Depression: - Provides group verbal and written instruction on the correlation between heart/lung disease and depressed mood, treatment options, and the stigmas associated with seeking treatment.   Exercise & Equipment Safety: - Individual verbal instruction and demonstration of equipment use and safety with use of the equipment.   Pulmonary Rehab from 03/23/2018 in [facility] Cardiac and Pulmonary Rehab  Date  11/30/17  Educator  [facility]  Instruction Review Code  1- Verbalizes Understanding      Infection Prevention: - Provides verbal and written material  to individual with discussion of infection control including proper hand washing and proper equipment cleaning during exercise session.   Pulmonary Rehab from 03/23/2018 in  [facility] Cardiac and Pulmonary Rehab  Date  11/30/17  Educator  [facility]  Instruction Review Code  1- Verbalizes Understanding      Falls Prevention: - Provides verbal and written material to individual with discussion of falls prevention and safety.   Pulmonary Rehab from 03/23/2018 in [facility] Cardiac and Pulmonary Rehab  Date  11/30/17  Educator  [facility]  Instruction Review Code  1- Verbalizes Understanding      Diabetes: - Individual verbal and written instruction to review signs/symptoms of diabetes, desired ranges of glucose level fasting, after meals and with exercise. Advice that pre and post exercise glucose checks will be done for 3 sessions at entry of program.   Chronic Lung Diseases: - Group verbal and written instruction to review updates, respiratory medications, advancements in procedures and treatments. Discuss use of supplemental oxygen including available portable oxygen systems, continuous and intermittent flow rates, concentrators, personal use and safety guidelines. Review proper use of inhaler and spacers. Provide informative websites for self-education.    Pulmonary Rehab from 03/23/2018 in [facility] Cardiac and Pulmonary Rehab  Date  01/12/18  Educator  [facility]  Instruction Review Code  1- Verbalizes Understanding      Energy Conservation: - Provide group verbal and written instruction for methods to conserve energy, plan and organize activities. Instruct on pacing techniques, use of adaptive equipment and posture/positioning to relieve shortness of breath.   Pulmonary Rehab from 03/23/2018 in [facility] Cardiac and Pulmonary Rehab  Date  03/09/18  Educator  [facility]  Instruction Review Code  1- Verbalizes Understanding      Triggers and Exacerbations: - Group verbal and written instruction to review types of environmental  triggers and ways to prevent exacerbations. Discuss weather changes, air quality and the benefits of nasal washing. Review warning signs and symptoms to help prevent infections. Discuss techniques for effective airway clearance, coughing, and vibrations.   Pulmonary Rehab from 03/23/2018 in [facility] Cardiac and Pulmonary Rehab  Date  02/09/18  Educator  [facility]  Instruction Review Code  1- Verbalizes Understanding      AED/CPR: - Group verbal and written instruction with the use of models to demonstrate the basic use of the AED with the basic ABC's of resuscitation.   Pulmonary Rehab from 03/23/2018 in [facility] Cardiac and Pulmonary Rehab  Date  12/17/17  Educator  [facility]  Instruction Review Code  1- Actuary and Physiology of the Lungs: - Group verbal and written instruction with the use of models to provide basic lung anatomy and physiology related to function, structure and complications of lung disease.   Pulmonary Rehab from 03/23/2018 in [facility] Cardiac and Pulmonary Rehab  Date  03/23/18  Educator  [facility]  Instruction Review Code  5- Refused Teaching      Anatomy & Physiology of the Heart: - Group verbal and written instruction and models provide basic cardiac anatomy and physiology, with the coronary electrical and arterial systems. Review of Valvular disease and Heart Failure   Pulmonary Rehab from 03/23/2018 in [facility] Cardiac and Pulmonary Rehab  Date  02/16/18  Educator  [facility]  Instruction Review Code  1- Verbalizes Understanding      Cardiac Medications: - Group verbal and written instruction to review commonly prescribed medications for heart disease. Reviews the medication, class of the drug, and side effects.   Know Your Numbers and Risk Factors: -Group verbal and written instruction about important numbers in your health.  Discussion of what are risk factors and how they play a role in the disease process.  Review of Cholesterol, Blood Pressure, Diabetes, and BMI and  the role they play in your overall health.   Pulmonary Rehab from 03/23/2018 in [facility] Cardiac and Pulmonary Rehab  Date  01/28/18  Educator  [facility]  Instruction Review Code  1- Verbalizes Understanding      Sleep Hygiene: -Provides group verbal and written instruction  about how sleep can affect your health.  Define sleep hygiene, discuss sleep cycles and impact of sleep habits. Review good sleep hygiene tips.    Pulmonary Rehab from 03/23/2018 in [facility] Cardiac and Pulmonary Rehab  Date  02/02/18  Educator  [facility]  Instruction Review Code  1- Verbalizes Understanding      Other: -Provides group and verbal instruction on various topics (see comments)    Knowledge Questionnaire Score: Knowledge Questionnaire Score - 03/11/18 1140      Knowledge Questionnaire Score   Pre Score  15/18    Post Score  18/18        Core Components/Risk Factors/Patient Goals at Admission: Personal Goals and Risk Factors at Admission - 11/30/17 1047      Core Components/Risk Factors/Patient Goals on Admission    Weight Management  Yes;Weight Loss    Intervention  Weight Management: Develop a combined nutrition and exercise program designed to reach desired caloric intake, while maintaining appropriate intake of nutrient and fiber, sodium and fats, and appropriate energy expenditure required for the weight goal.;Weight Management: Provide education and appropriate resources to help participant work on and attain dietary goals.;Weight Management/Obesity: Establish reasonable short term and long term weight goals.    Admit Weight  179 lb 6.4 oz (81.4 kg)    Goal Weight: Short Term  174 lb (78.9 kg)    Goal Weight: Long Term  170 lb (77.1 kg)    Expected Outcomes  Short Term: Continue to assess and modify interventions until short term weight is achieved;Long Term: Adherence to nutrition and physical activity/exercise program aimed toward attainment of established weight goal;Weight Maintenance: Understanding of the  daily nutrition guidelines, which includes 25-35% calories from fat, 7% or less cal from saturated fats, less than '200mg'$  cholesterol, less than 1.5gm of sodium, & 5 or more servings of fruits and vegetables daily;Weight Loss: Understanding of general recommendations for a balanced deficit meal plan, which promotes 1-2 lb weight loss per week and includes a negative energy balance of [id number] kcal/d;Understanding recommendations for meals to include 15-35% energy as protein, 25-35% energy from fat, 35-60% energy from carbohydrates, less than '200mg'$  of dietary cholesterol, 20-35 gm of total fiber daily;Understanding of distribution of calorie intake throughout the day with the consumption of 4-5 meals/snacks    Improve shortness of breath with ADL's  Yes    Intervention  Provide education, individualized exercise plan and daily activity instruction to help decrease symptoms of SOB with activities of daily living.    Expected Outcomes  Short Term: Improve cardiorespiratory fitness to achieve a reduction of symptoms when performing ADLs;Long Term: Be able to perform more ADLs without symptoms or delay the onset of symptoms    Diabetes  Yes    Intervention  Provide education about signs/symptoms and action to take for hypo/hyperglycemia.;Provide education about proper nutrition, including hydration, and aerobic/resistive exercise prescription along with prescribed medications to achieve blood glucose in normal ranges: Fasting glucose 65-99 mg/dL    Expected Outcomes  Short Term: Participant verbalizes understanding of the signs/symptoms and immediate care of hyper/hypoglycemia, proper foot care and importance of medication, aerobic/resistive exercise and nutrition plan for blood glucose control.;Long Term: Attainment of HbA1C < 7%.    Hypertension  Yes    Intervention  Provide education on lifestyle modifcations including regular physical activity/exercise, weight management, moderate sodium restriction and  increased consumption of fresh fruit, vegetables, and low fat dairy, alcohol moderation, and smoking cessation.;Monitor prescription use compliance.    Expected Outcomes  Long Term: Maintenance of blood pressure at goal levels.;Short Term: Continued assessment and intervention until BP is < 140/51m HG in hypertensive participants. < 130/881mHG in hypertensive participants with diabetes, heart failure or chronic kidney disease.    Lipids  Yes    Intervention  Provide education and support for participant on nutrition & aerobic/resistive exercise along with prescribed medications to achieve LDL '70mg'$ , HDL >'40mg'$ .    Expected Outcomes  Short Term: Participant states understanding of desired cholesterol values and is compliant with medications prescribed. Participant is following exercise prescription and nutrition guidelines.;Long Term: Cholesterol controlled with medications as prescribed, with individualized exercise RX and with personalized nutrition plan. Value goals: LDL < '70mg'$ , HDL > 40 mg.       Core Components/Risk Factors/Patient Goals Review:  Goals and Risk Factor Review    Row Name 01/12/18 1044 02/04/18 1101 03/09/18 1406         Core Components/Risk Factors/Patient Goals Review   Personal Goals Review  Weight Management/Obesity;Improve shortness of breath with ADL's;Diabetes;Hypertension;Lipids  Weight Management/Obesity;Improve shortness of breath with ADL's;Diabetes;Hypertension;Lipids  Weight Management/Obesity;Improve shortness of breath with ADL's;Diabetes;Hypertension;Lipids     Review  StDerrekeels like his breathing has got alot better since the start of the program. He feels alot stronger and that his breathing is easier. He checks his blood sugar 3 times a day and has no issues checking. He has a wellness appointment in June to check is lipids and other labs.  Bascom's weight was up some today at 181 lbs but he did have some ice cream last night.  He is doing a little better  with his breathing. He still gets SOB when he really exerts and stamina also. He feels 500% better but still wants to keep improving.  He continues to check his blood sugars in the mornings and averages 110-125.  His blood pressures have been good in class, but he does not check them at home.  His meds are working well for him.  Steve's weight has continue to stay steady around 180 lbs.  His breathing has greatly improved since starting the program.  He has been doing more with her SOB.  He has been doing well with his blood pressures and checks it at home at least three times a week.  he continues to check his blood sugars daily and they have been good.       Expected Outcomes  Short: go to his appointment to check cholestorol. Long: keep cholestorol within normal limits.  Short: Continue to work on weight loss.  Long: Continue to improve SOB.   Short: Continue to work towards weight loss.  Long: Continue to work on risk factors.         Core Components/Risk Factors/Patient Goals at Discharge (Final Review):  Goals and Risk Factor Review - 03/09/18 1406      Core Components/Risk Factors/Patient Goals Review   Personal Goals Review  Weight Management/Obesity;Improve shortness of breath with ADL's;Diabetes;Hypertension;Lipids    Review  Steve's weight has continue to stay steady around 180 lbs.  His breathing has greatly improved since starting the program.  He has been doing more with her SOB.  He has been doing well with his blood pressures and checks it at home at least three times a week.  he continues to check his blood sugars daily and they have been good.      Expected Outcomes  Short: Continue to work towards weight loss.  Long: Continue to work on risk  factors.        ITP Comments: ITP Comments    Row Name 11/30/17 1138 12/27/17 0829 01/24/18 0840 02/21/18 0831 02/28/18 1023   ITP Comments  Medical Evaluation completed. Chart sent for review and changes to Dr. Emily Filbert Director of  [place]. Diagnosis can be found in [facility] encounter 11/15/17  30 day review completed. ITP sent to Dr. Emily Filbert Director of [place]. Continue with ITP unless changes are made by physician.   30 day review completed. ITP sent to Dr. Emily Filbert Director of [place]. Continue with ITP unless changes are made by physician   30 day review completed. ITP sent to Dr. Emily Filbert Director of [place]. Continue with ITP unless changes are made by physician  Billy Wise called to let us know that he will be out again today.  He has had a virus since Friday of last week that has left him feeling very weak.    Row Name 03/04/18 1007 03/21/18 0828 03/23/18 0953       ITP Comments  Billy Wise is feeling better, but his mom passed away.  He will be out today.    30 day review completed. ITP sent to Dr. Emily Filbert Director of [place]. Continue with ITP unless changes are made by physician  Discharge ITP sent and signed by Dr. Sabra Heck.  Discharge Summary routed to PCP and pulmonologist.        Comments:Discharge ITP

## 2018-03-23 NOTE — Patient Instructions (Signed)
Discharge Patient Instructions  Patient Details  Name: Billy Wise MRN: [id number] Date of Birth: Dec 15, 1945 Referring Provider:  Laverle Hobby, *   Number of Visits: 36/36  Reason for Discharge:  Patient reached a stable level of exercise. Patient independent in their exercise. Patient has met program and personal goals.  Smoking History:  Social History   Tobacco Use  Smoking Status Former Smoker  . Packs/day: 1.50  . Years: 50.00  . Pack years: 75.00  . Types: Cigarettes  . Last attempt to quit: 09/29/2008  . Years since quitting: 9.4  Smokeless Tobacco Never Used    Diagnosis:  COPD with chronic bronchitis and emphysema ([place])  Initial Exercise Prescription: Initial Exercise Prescription - 11/30/17 1100      Date of Initial Exercise RX and Referring Provider   Date  11/30/17    Referring Provider  Ashby Dawes      Treadmill   MPH  2.5    Grade  0.5    Minutes  15    METs  3      Recumbant Bike   Level  3    RPM  60    Watts  28    Minutes  15    METs  3      Arm Ergometer   Level  2    Watts  44    RPM  40    Minutes  15    METs  3      Recumbant Elliptical   Level  2    RPM  60    Minutes  15    METs  3      REL-XR   Level  2    Speed  50    Minutes  15    METs  3      Prescription Details   Frequency (times per week)  3    Duration  Progress to 45 minutes of aerobic exercise without signs/symptoms of physical distress      Intensity   THRR 40-80% of Max Heartrate  118-140    Ratings of Perceived Exertion  11-15    Perceived Dyspnea  0-4      Resistance Training   Training Prescription  Yes    Weight  3 lb    Reps  10-15       Discharge Exercise Prescription (Final Exercise Prescription Changes): Exercise Prescription Changes - 03/21/18 1600      Response to Exercise   Blood Pressure (Admit)  120/70    Blood Pressure (Exit)  118/58    Heart Rate (Admit)  96 bpm    Heart Rate (Exercise)  116 bpm    Heart  Rate (Exit)  95 bpm    Oxygen Saturation (Admit)  92 %    Oxygen Saturation (Exercise)  93 %    Oxygen Saturation (Exit)  88 %    Rating of Perceived Exertion (Exercise)  12    Perceived Dyspnea (Exercise)  0    Symptoms  none    Duration  Continue with 45 min of aerobic exercise without signs/symptoms of physical distress.    Intensity  THRR unchanged      Progression   Progression  Continue to progress workloads to maintain intensity without signs/symptoms of physical distress.      Resistance Training   Training Prescription  Yes    Weight  7 lb    Reps  10-15      Interval Training  Interval Training  No      Recumbant Bike   Level  8    Minutes  15       Functional Capacity: 6 Minute Walk    Row Name 11/30/17 1141 03/21/18 1132       6 Minute Walk   Phase  Initial  Discharge    Distance  1350 feet  1700 feet    Distance % Change  -  26 %    Distance Feet Change  -  350 ft    Walk Time  6 minutes  6 minutes    # of Rest Breaks  0  0    MPH  2.55  3.21    METS  3.09  3.75    RPE  11  7    Perceived Dyspnea   2  0    VO2 Peak  10.84  13.14    Symptoms  No  No    Resting HR  96 bpm  92 bpm    Resting BP  100/56  120/70    Resting Oxygen Saturation   90 %  94 %    Exercise Oxygen Saturation  during 6 min walk  85 %  88 %    Max Ex. HR  126 bpm  118 bpm    Max Ex. BP  126/54  142/58    2 Minute Post BP  110/58  120/62      Interval HR   1 Minute HR  113  116    2 Minute HR  115  108    3 Minute HR  117  110    4 Minute HR  117  118    5 Minute HR  117  107    6 Minute HR  126  117    2 Minute Post HR  97  93    Interval Heart Rate?  Yes  Yes      Interval Oxygen   Interval Oxygen?  Yes  Yes    Baseline Oxygen Saturation %  90 %  94 %    1 Minute Oxygen Saturation %  87 %  93 %    1 Minute Liters of Oxygen  0 L  0 L    2 Minute Oxygen Saturation %  85 %  91 %    2 Minute Liters of Oxygen  0 L  0 L    3 Minute Oxygen Saturation %  85 %  92 %    3  Minute Liters of Oxygen  0 L  0 L    4 Minute Oxygen Saturation %  86 %  88 %    4 Minute Liters of Oxygen  0 L  0 L    5 Minute Oxygen Saturation %  87 %  89 %    5 Minute Liters of Oxygen  0 L  0 L    6 Minute Oxygen Saturation %  86 %  89 %    6 Minute Liters of Oxygen  0 L  0 L    2 Minute Post Oxygen Saturation %  93 %  96 %    2 Minute Post Liters of Oxygen  0 L  0 L       Quality of Life:   Personal Goals: Goals established at orientation with interventions provided to work toward goal. Personal Goals and Risk Factors at Admission - 11/30/17 1047      Core  Components/Risk Factors/Patient Goals on Admission    Weight Management  Yes;Weight Loss    Intervention  Weight Management: Develop a combined nutrition and exercise program designed to reach desired caloric intake, while maintaining appropriate intake of nutrient and fiber, sodium and fats, and appropriate energy expenditure required for the weight goal.;Weight Management: Provide education and appropriate resources to help participant work on and attain dietary goals.;Weight Management/Obesity: Establish reasonable short term and long term weight goals.    Admit Weight  179 lb 6.4 oz (81.4 kg)    Goal Weight: Short Term  174 lb (78.9 kg)    Goal Weight: Long Term  170 lb (77.1 kg)    Expected Outcomes  Short Term: Continue to assess and modify interventions until short term weight is achieved;Long Term: Adherence to nutrition and physical activity/exercise program aimed toward attainment of established weight goal;Weight Maintenance: Understanding of the daily nutrition guidelines, which includes 25-35% calories from fat, 7% or less cal from saturated fats, less than 256m cholesterol, less than 1.5gm of sodium, & 5 or more servings of fruits and vegetables daily;Weight Loss: Understanding of general recommendations for a balanced deficit meal plan, which promotes 1-2 lb weight loss per week and includes a negative energy balance  of [phone] kcal/d;Understanding recommendations for meals to include 15-35% energy as protein, 25-35% energy from fat, 35-60% energy from carbohydrates, less than 2054mof dietary cholesterol, 20-35 gm of total fiber daily;Understanding of distribution of calorie intake throughout the day with the consumption of 4-5 meals/snacks    Improve shortness of breath with ADL's  Yes    Intervention  Provide education, individualized exercise plan and daily activity instruction to help decrease symptoms of SOB with activities of daily living.    Expected Outcomes  Short Term: Improve cardiorespiratory fitness to achieve a reduction of symptoms when performing ADLs;Long Term: Be able to perform more ADLs without symptoms or delay the onset of symptoms    Diabetes  Yes    Intervention  Provide education about signs/symptoms and action to take for hypo/hyperglycemia.;Provide education about proper nutrition, including hydration, and aerobic/resistive exercise prescription along with prescribed medications to achieve blood glucose in normal ranges: Fasting glucose 65-99 mg/dL    Expected Outcomes  Short Term: Participant verbalizes understanding of the signs/symptoms and immediate care of hyper/hypoglycemia, proper foot care and importance of medication, aerobic/resistive exercise and nutrition plan for blood glucose control.;Long Term: Attainment of HbA1C < 7%.    Hypertension  Yes    Intervention  Provide education on lifestyle modifcations including regular physical activity/exercise, weight management, moderate sodium restriction and increased consumption of fresh fruit, vegetables, and low fat dairy, alcohol moderation, and smoking cessation.;Monitor prescription use compliance.    Expected Outcomes  Long Term: Maintenance of blood pressure at goal levels.;Short Term: Continued assessment and intervention until BP is < 140/9015mG in hypertensive participants. < 130/30m67m in hypertensive participants with  diabetes, heart failure or chronic kidney disease.    Lipids  Yes    Intervention  Provide education and support for participant on nutrition & aerobic/resistive exercise along with prescribed medications to achieve LDL <70mg107mL >40mg.8mExpected Outcomes  Short Term: Participant states understanding of desired cholesterol values and is compliant with medications prescribed. Participant is following exercise prescription and nutrition guidelines.;Long Term: Cholesterol controlled with medications as prescribed, with individualized exercise RX and with personalized nutrition plan. Value goals: LDL < 70mg, 104m> 40 mg.        Personal  Goals Discharge: Goals and Risk Factor Review - 03/09/18 1406      Core Components/Risk Factors/Patient Goals Review   Personal Goals Review  Weight Management/Obesity;Improve shortness of breath with ADL's;Diabetes;Hypertension;Lipids    Review  Steve's weight has continue to stay steady around 180 lbs.  His breathing has greatly improved since starting the program.  He has been doing more with her SOB.  He has been doing well with his blood pressures and checks it at home at least three times a week.  he continues to check his blood sugars daily and they have been good.      Expected Outcomes  Short: Continue to work towards weight loss.  Long: Continue to work on risk factors.        Exercise Goals and Review: Exercise Goals    Row Name 11/30/17 1158             Exercise Goals   Increase Physical Activity  Yes       Intervention  Provide advice, education, support and counseling about physical activity/exercise needs.;Develop an individualized exercise prescription for aerobic and resistive training based on initial evaluation findings, risk stratification, comorbidities and participant's personal goals.       Expected Outcomes  Short Term: Attend rehab on a regular basis to increase amount of physical activity.;Long Term: Add in home exercise to make  exercise part of routine and to increase amount of physical activity.;Long Term: Exercising regularly at least 3-5 days a week.       Increase Strength and Stamina  Yes       Intervention  Provide advice, education, support and counseling about physical activity/exercise needs.;Develop an individualized exercise prescription for aerobic and resistive training based on initial evaluation findings, risk stratification, comorbidities and participant's personal goals.       Expected Outcomes  Short Term: Increase workloads from initial exercise prescription for resistance, speed, and METs.;Short Term: Perform resistance training exercises routinely during rehab and add in resistance training at home;Long Term: Improve cardiorespiratory fitness, muscular endurance and strength as measured by increased METs and functional capacity (6MWT)       Able to understand and use rate of perceived exertion (RPE) scale  Yes       Intervention  Provide education and explanation on how to use RPE scale       Expected Outcomes  Short Term: Able to use RPE daily in rehab to express subjective intensity level;Long Term:  Able to use RPE to guide intensity level when exercising independently       Able to understand and use Dyspnea scale  Yes       Intervention  Provide education and explanation on how to use Dyspnea scale       Expected Outcomes  Short Term: Able to use Dyspnea scale daily in rehab to express subjective sense of shortness of breath during exertion;Long Term: Able to use Dyspnea scale to guide intensity level when exercising independently       Knowledge and understanding of Target Heart Rate Range (THRR)  Yes       Intervention  Provide education and explanation of THRR including how the numbers were predicted and where they are located for reference       Expected Outcomes  Short Term: Able to state/look up THRR;Long Term: Able to use THRR to govern intensity when exercising independently;Short Term: Able to  use daily as guideline for intensity in rehab       Able to  check pulse independently  Yes       Intervention  Provide education and demonstration on how to check pulse in carotid and radial arteries.;Review the importance of being able to check your own pulse for safety during independent exercise       Expected Outcomes  Short Term: Able to explain why pulse checking is important during independent exercise;Long Term: Able to check pulse independently and accurately       Understanding of Exercise Prescription  Yes       Intervention  Provide education, explanation, and written materials on patient's individual exercise prescription       Expected Outcomes  Short Term: Able to explain program exercise prescription;Long Term: Able to explain home exercise prescription to exercise independently          Nutrition & Weight - Outcomes: Pre Biometrics - 11/30/17 1144      Pre Biometrics   Height  5' 5.25" (1.657 m)    Weight  179 lb 6.4 oz (81.4 kg)    Waist Circumference  41.5 inches    Hip Circumference  42 inches    Waist to Hip Ratio  0.99 %    BMI (Calculated)  29.64      Post Biometrics - 03/21/18 1135       Post  Biometrics   Height  5' 5.25" (1.657 m)    Weight  181 lb (82.1 kg)    Waist Circumference  41.25 inches    Hip Circumference  39.75 inches    Waist to Hip Ratio  1.04 %    BMI (Calculated)  29.9       Nutrition: Nutrition Therapy & Goals - 11/30/17 1037      Personal Nutrition Goals   Comments  eat healthier and lose some wieght.      Intervention Plan   Intervention  Prescribe, educate and counsel regarding individualized specific dietary modifications aiming towards targeted core components such as weight, hypertension, lipid management, diabetes, heart failure and other comorbidities.;Nutrition handout(s) given to patient.    Expected Outcomes  Short Term Goal: Understand basic principles of dietary content, such as calories, fat, sodium, cholesterol and  nutrients.;Short Term Goal: A plan has been developed with personal nutrition goals set during dietitian appointment.;Long Term Goal: Adherence to prescribed nutrition plan.       Nutrition Discharge: Nutrition Assessments - 03/11/18 1141      MEDFICTS Scores   Pre Score  108    Post Score  66    Score Difference  -42       Education Questionnaire Score: Knowledge Questionnaire Score - 03/11/18 1140      Knowledge Questionnaire Score   Pre Score  15/18    Post Score  18/18       Goals reviewed with patient; copy given to patient.

## 2018-03-23 NOTE — Progress Notes (Signed)
Daily Session Note  Patient Details  Name: Billy Wise MRN: [id number] Date of Birth: [birth year] Referring Provider:     Pulmonary Rehab from 11/30/2017 in [facility]  Referring Provider  Ramachandran      Encounter Date: 03/23/2018  Check In: Session Check In - 03/23/18 3491      Check-In   Location  [facility]-Card[facility] & Pulmonary Rehab    Staff Present  Billy Wise RCP,RRT,BSRT;Billy Frederico Hamman, RN Billy Wise, BA, ACSM CEP, Exercise Physiologist    Supervising physician immediately available to respond to emergencies  [facility] immediately available ER MD    Physician(s)  Dr. Quentin Wise and Billy Wise    Medication changes reported      No    Fall or balance concerns reported     No    Tobacco Cessation  No Change    Warm-up and Cool-down  Performed as group-led instruction    Resistance Training Performed  Yes    VAD Patient?  No      Pain Assessment   Currently in Pain?  No/denies          Social History   Tobacco Use  Smoking Status Former Smoker  . Packs/day: 1.50  . Years: 50.00  . Pack years: 75.00  . Types: Cigarettes  . Last attempt to quit: 09/29/2008  . Years since quitting: 9.4  Smokeless Tobacco Never Used    Goals Met:  Proper associated with RPD/PD & O2 Sat Independence with exercise equipment Improved SOB with ADL's Using PLB without cueing & demonstrates good technique Exercise tolerated well No report of cardiac concerns or symptoms Strength training completed today  Goals Unmet:  Not Applicable  Comments:  Billy Wise graduated today from  rehab with 36 sessions completed.  Details of the patient's exercise prescription and what He needs to do in order to continue the prescription and progress were discussed with patient.  Patient was given a copy of prescription and goals.  Patient verbalized understanding.  Billy Wise plans to continue to exercise by joining the [facility]   Dr. Emily Wise is Medical Director for  [place] and [facility].

## 2018-03-23 NOTE — Progress Notes (Signed)
Discharge Progress Report  Patient Details  Name: Billy Wise MRN: [id number] Date of Birth: 09-Jul-1946 Referring Provider:     Pulmonary Rehab from 11/30/2017 in [facility]  Referring Provider  Ramachandran       Number of Visits: 36/36  Reason for Discharge:  Patient reached a stable level of exercise. Patient independent in their exercise. Patient has met program and personal goals.  Smoking History:  Social History   Tobacco Use  Smoking Status Former Smoker  . Packs/day: 1.50  . Years: 50.00  . Pack years: 75.00  . Types: Cigarettes  . Last attempt to quit: 09/29/2008  . Years since quitting: 9.4  Smokeless Tobacco Never Used    Diagnosis:  COPD with chronic bronchitis and emphysema ([place])  ADL [facility]: Pulmonary Assessment Scores    Row Name 11/30/17 1147 03/11/18 1138 03/21/18 1137     ADL [facility]   ADL Phase  Entry  Exit  Exit   SOB Score total  79  16  -   Rest  1  0  -   Walk  1  0  -   Stairs  5  4  -   Bath  2  0  -   Dress  3  0  -   Shop  4  0  -     CAT Score   CAT Score  26  12  -     mMRC Score   mMRC Score  1  -  0      Initial Exercise Prescription: Initial Exercise Prescription - 11/30/17 1100      Date of Initial Exercise RX and Referring Provider   Date  11/30/17    Referring Provider  Ashby Dawes      Treadmill   MPH  2.5    Grade  0.5    Minutes  15    METs  3      Recumbant Bike   Level  3    RPM  60    Watts  28    Minutes  15    METs  3      Arm Ergometer   Level  2    Watts  44    RPM  40    Minutes  15    METs  3      Recumbant Elliptical   Level  2    RPM  60    Minutes  15    METs  3      REL-XR   Level  2    Speed  50    Minutes  15    METs  3      Prescription Details   Frequency (times per week)  3    Duration  Progress to 45 minutes of aerobic exercise without signs/symptoms of physical distress      Intensity   THRR 40-80% of Max Heartrate  118-140    Ratings  of Perceived Exertion  11-15    Perceived Dyspnea  0-4      Resistance Training   Training Prescription  Yes    Weight  3 lb    Reps  10-15       Discharge Exercise Prescription (Final Exercise Prescription Changes): Exercise Prescription Changes - 03/21/18 1600      Response to Exercise   Blood Pressure (Admit)  120/70    Blood Pressure (Exit)  118/58    Heart Rate (  Admit)  96 bpm    Heart Rate (Exercise)  116 bpm    Heart Rate (Exit)  95 bpm    Oxygen Saturation (Admit)  92 %    Oxygen Saturation (Exercise)  93 %    Oxygen Saturation (Exit)  88 %    Rating of Perceived Exertion (Exercise)  12    Perceived Dyspnea (Exercise)  0    Symptoms  none    Duration  Continue with 45 min of aerobic exercise without signs/symptoms of physical distress.    Intensity  THRR unchanged      Progression   Progression  Continue to progress workloads to maintain intensity without signs/symptoms of physical distress.      Resistance Training   Training Prescription  Yes    Weight  7 lb    Reps  10-15      Interval Training   Interval Training  No      Recumbant Bike   Level  8    Minutes  15       Functional Capacity: 6 Minute Walk    Row Name 11/30/17 1141 03/21/18 1132       6 Minute Walk   Phase  Initial  Discharge    Distance  1350 feet  1700 feet    Distance % Change  -  26 %    Distance Feet Change  -  350 ft    Walk Time  6 minutes  6 minutes    # of Rest Breaks  0  0    MPH  2.55  3.21    METS  3.09  3.75    RPE  11  7    Perceived Dyspnea   2  0    VO2 Peak  10.84  13.14    Symptoms  No  No    Resting HR  96 bpm  92 bpm    Resting BP  100/56  120/70    Resting Oxygen Saturation   90 %  94 %    Exercise Oxygen Saturation  during 6 min walk  85 %  88 %    Max Ex. HR  126 bpm  118 bpm    Max Ex. BP  126/54  142/58    2 Minute Post BP  110/58  120/62      Interval HR   1 Minute HR  113  116    2 Minute HR  115  108    3 Minute HR  117  110    4 Minute HR   117  118    5 Minute HR  117  107    6 Minute HR  126  117    2 Minute Post HR  97  93    Interval Heart Rate?  Yes  Yes      Interval Oxygen   Interval Oxygen?  Yes  Yes    Baseline Oxygen Saturation %  90 %  94 %    1 Minute Oxygen Saturation %  87 %  93 %    1 Minute Liters of Oxygen  0 L  0 L    2 Minute Oxygen Saturation %  85 %  91 %    2 Minute Liters of Oxygen  0 L  0 L    3 Minute Oxygen Saturation %  85 %  92 %    3 Minute Liters of Oxygen  0 L  0 L  4 Minute Oxygen Saturation %  86 %  88 %    4 Minute Liters of Oxygen  0 L  0 L    5 Minute Oxygen Saturation %  87 %  89 %    5 Minute Liters of Oxygen  0 L  0 L    6 Minute Oxygen Saturation %  86 %  89 %    6 Minute Liters of Oxygen  0 L  0 L    2 Minute Post Oxygen Saturation %  93 %  96 %    2 Minute Post Liters of Oxygen  0 L  0 L       Psychological, QOL, Others - Outcomes: PHQ 2/9: Depression screen [facility] 2/9 03/11/2018 12/13/2017 11/30/2017  Decreased Interest 0 3 3  Down, Depressed, Hopeless 0 0 0  PHQ - 2 Score 0 3 3  Altered sleeping 0 1 3  Tired, decreased energy '1 3 3  ' Change in appetite 0 0 0  Feeling bad or failure about yourself  0 0 0  Trouble concentrating 0 0 0  Moving slowly or fidgety/restless 0 0 0  Suicidal thoughts 0 0 0  PHQ-9 Score '1 7 9  ' Difficult doing work/chores Not difficult at all Not difficult at all Not difficult at all    Quality of Life:   Personal Goals: Goals established at orientation with interventions provided to work toward goal. Personal Goals and Risk Factors at Admission - 11/30/17 1047      Core Components/Risk Factors/Patient Goals on Admission    Weight Management  Yes;Weight Loss    Intervention  Weight Management: Develop a combined nutrition and exercise program designed to reach desired caloric intake, while maintaining appropriate intake of nutrient and fiber, sodium and fats, and appropriate energy expenditure required for the weight goal.;Weight Management:  Provide education and appropriate resources to help participant work on and attain dietary goals.;Weight Management/Obesity: Establish reasonable short term and long term weight goals.    Admit Weight  179 lb 6.4 oz (81.4 kg)    Goal Weight: Short Term  174 lb (78.9 kg)    Goal Weight: Long Term  170 lb (77.1 kg)    Expected Outcomes  Short Term: Continue to assess and modify interventions until short term weight is achieved;Long Term: Adherence to nutrition and physical activity/exercise program aimed toward attainment of established weight goal;Weight Maintenance: Understanding of the daily nutrition guidelines, which includes 25-35% calories from fat, 7% or less cal from saturated fats, less than 27m cholesterol, less than 1.5gm of sodium, & 5 or more servings of fruits and vegetables daily;Weight Loss: Understanding of general recommendations for a balanced deficit meal plan, which promotes 1-2 lb weight loss per week and includes a negative energy balance of [phone] kcal/d;Understanding recommendations for meals to include 15-35% energy as protein, 25-35% energy from fat, 35-60% energy from carbohydrates, less than 2016mof dietary cholesterol, 20-35 gm of total fiber daily;Understanding of distribution of calorie intake throughout the day with the consumption of 4-5 meals/snacks    Improve shortness of breath with ADL's  Yes    Intervention  Provide education, individualized exercise plan and daily activity instruction to help decrease symptoms of SOB with activities of daily living.    Expected Outcomes  Short Term: Improve cardiorespiratory fitness to achieve a reduction of symptoms when performing ADLs;Long Term: Be able to perform more ADLs without symptoms or delay the onset of symptoms    Diabetes  Yes  Intervention  Provide education about signs/symptoms and action to take for hypo/hyperglycemia.;Provide education about proper nutrition, including hydration, and aerobic/resistive exercise  prescription along with prescribed medications to achieve blood glucose in normal ranges: Fasting glucose 65-99 mg/dL    Expected Outcomes  Short Term: Participant verbalizes understanding of the signs/symptoms and immediate care of hyper/hypoglycemia, proper foot care and importance of medication, aerobic/resistive exercise and nutrition plan for blood glucose control.;Long Term: Attainment of HbA1C < 7%.    Hypertension  Yes    Intervention  Provide education on lifestyle modifcations including regular physical activity/exercise, weight management, moderate sodium restriction and increased consumption of fresh fruit, vegetables, and low fat dairy, alcohol moderation, and smoking cessation.;Monitor prescription use compliance.    Expected Outcomes  Long Term: Maintenance of blood pressure at goal levels.;Short Term: Continued assessment and intervention until BP is < 140/68m HG in hypertensive participants. < 130/873mHG in hypertensive participants with diabetes, heart failure or chronic kidney disease.    Lipids  Yes    Intervention  Provide education and support for participant on nutrition & aerobic/resistive exercise along with prescribed medications to achieve LDL <7051mHDL >31m12m  Expected Outcomes  Short Term: Participant states understanding of desired cholesterol values and is compliant with medications prescribed. Participant is following exercise prescription and nutrition guidelines.;Long Term: Cholesterol controlled with medications as prescribed, with individualized exercise RX and with personalized nutrition plan. Value goals: LDL < 70mg72mL > 40 mg.        Personal Goals Discharge: Goals and Risk Factor Review    Row Name 01/12/18 1044 02/04/18 1101 03/09/18 1406         Core Components/Risk Factors/Patient Goals Review   Personal Goals Review  Weight Management/Obesity;Improve shortness of breath with ADL's;Diabetes;Hypertension;Lipids  Weight Management/Obesity;Improve  shortness of breath with ADL's;Diabetes;Hypertension;Lipids  Weight Management/Obesity;Improve shortness of breath with ADL's;Diabetes;Hypertension;Lipids     Review  Billy Wise like his breathing has got alot better since the start of the program. He feels alot stronger and that his breathing is easier. He checks his blood sugar 3 times a day and has no issues checking. He has a wellness appointment in June to check is lipids and other labs.  Billy Wise's weight was up some today at 181 lbs but he did have some ice cream last night.  He is doing a little better with his breathing. He still gets SOB when he really exerts and stamina also. He feels 500% better but still wants to keep improving.  He continues to check his blood sugars in the mornings and averages 110-125.  His blood pressures have been good in class, but he does not check them at home.  His meds are working well for him.  Billy Wise's weight has continue to stay steady around 180 lbs.  His breathing has greatly improved since starting the program.  He has been doing more with her SOB.  He has been doing well with his blood pressures and checks it at home at least three times a week.  he continues to check his blood sugars daily and they have been good.       Expected Outcomes  Short: go to his appointment to check cholestorol. Long: keep cholestorol within normal limits.  Short: Continue to work on weight loss.  Long: Continue to improve SOB.   Short: Continue to work towards weight loss.  Long: Continue to work on risk factors.         Exercise Goals and Review:  Exercise Goals    Row Name 11/30/17 1158             Exercise Goals   Increase Physical Activity  Yes       Intervention  Provide advice, education, support and counseling about physical activity/exercise needs.;Develop an individualized exercise prescription for aerobic and resistive training based on initial evaluation findings, risk stratification, comorbidities and participant's  personal goals.       Expected Outcomes  Short Term: Attend rehab on a regular basis to increase amount of physical activity.;Long Term: Add in home exercise to make exercise part of routine and to increase amount of physical activity.;Long Term: Exercising regularly at least 3-5 days a week.       Increase Strength and Stamina  Yes       Intervention  Provide advice, education, support and counseling about physical activity/exercise needs.;Develop an individualized exercise prescription for aerobic and resistive training based on initial evaluation findings, risk stratification, comorbidities and participant's personal goals.       Expected Outcomes  Short Term: Increase workloads from initial exercise prescription for resistance, speed, and METs.;Short Term: Perform resistance training exercises routinely during rehab and add in resistance training at home;Long Term: Improve cardiorespiratory fitness, muscular endurance and strength as measured by increased METs and functional capacity (6MWT)       Able to understand and use rate of perceived exertion (RPE) scale  Yes       Intervention  Provide education and explanation on how to use RPE scale       Expected Outcomes  Short Term: Able to use RPE daily in rehab to express subjective intensity level;Long Term:  Able to use RPE to guide intensity level when exercising independently       Able to understand and use Dyspnea scale  Yes       Intervention  Provide education and explanation on how to use Dyspnea scale       Expected Outcomes  Short Term: Able to use Dyspnea scale daily in rehab to express subjective sense of shortness of breath during exertion;Long Term: Able to use Dyspnea scale to guide intensity level when exercising independently       Knowledge and understanding of Target Heart Rate Range (THRR)  Yes       Intervention  Provide education and explanation of THRR including how the numbers were predicted and where they are located for  reference       Expected Outcomes  Short Term: Able to state/look up THRR;Long Term: Able to use THRR to govern intensity when exercising independently;Short Term: Able to use daily as guideline for intensity in rehab       Able to check pulse independently  Yes       Intervention  Provide education and demonstration on how to check pulse in carotid and radial arteries.;Review the importance of being able to check your own pulse for safety during independent exercise       Expected Outcomes  Short Term: Able to explain why pulse checking is important during independent exercise;Long Term: Able to check pulse independently and accurately       Understanding of Exercise Prescription  Yes       Intervention  Provide education, explanation, and written materials on patient's individual exercise prescription       Expected Outcomes  Short Term: Able to explain program exercise prescription;Long Term: Able to explain home exercise prescription to exercise independently  Nutrition & Weight - Outcomes: Pre Biometrics - 11/30/17 1144      Pre Biometrics   Height  5' 5.25" (1.657 m)    Weight  179 lb 6.4 oz (81.4 kg)    Waist Circumference  41.5 inches    Hip Circumference  42 inches    Waist to Hip Ratio  0.99 %    BMI (Calculated)  29.64      Post Biometrics - 03/21/18 1135       Post  Biometrics   Height  5' 5.25" (1.657 m)    Weight  181 lb (82.1 kg)    Waist Circumference  41.25 inches    Hip Circumference  39.75 inches    Waist to Hip Ratio  1.04 %    BMI (Calculated)  29.9       Nutrition: Nutrition Therapy & Goals - 11/30/17 1037      Personal Nutrition Goals   Comments  eat healthier and lose some wieght.      Intervention Plan   Intervention  Prescribe, educate and counsel regarding individualized specific dietary modifications aiming towards targeted core components such as weight, hypertension, lipid management, diabetes, heart failure and other  comorbidities.;Nutrition handout(s) given to patient.    Expected Outcomes  Short Term Goal: Understand basic principles of dietary content, such as calories, fat, sodium, cholesterol and nutrients.;Short Term Goal: A plan has been developed with personal nutrition goals set during dietitian appointment.;Long Term Goal: Adherence to prescribed nutrition plan.       Nutrition Discharge: Nutrition Assessments - 03/11/18 1141      MEDFICTS Scores   Pre Score  108    Post Score  66    Score Difference  -42       Education Questionnaire Score: Knowledge Questionnaire Score - 03/11/18 1140      Knowledge Questionnaire Score   Pre Score  15/18    Post Score  18/18       Goals reviewed with patient; copy given to patient.

## 2018-04-06 LAB — LIPID PANEL
Cholesterol: 114 (ref 0–200)
HDL: 23 — AB (ref 35–70)
LDL Cholesterol: 59
Triglycerides: 161 — AB (ref 40–160)

## 2018-04-06 LAB — PSA: PSA: 0.6

## 2018-04-07 LAB — HEPATIC FUNCTION PANEL
ALT: 18 (ref 10–40)
AST: 38 (ref 14–40)
Alkaline Phosphatase: 79 (ref 25–125)
Bilirubin, Total: 0.2

## 2018-04-07 LAB — CBC AND DIFFERENTIAL
HCT: 39 — AB (ref 41–53)
Hemoglobin: 12.8 — AB (ref 13.5–17.5)
Platelets: 262 (ref 150–399)
WBC: 7.6

## 2018-04-07 LAB — BASIC METABOLIC PANEL
BUN: 12 (ref 4–21)
CREATININE: 0.9 (ref 0.6–1.3)
Glucose: 116
Potassium: 4.6 (ref 3.4–5.3)
SODIUM: 138 (ref 137–147)

## 2018-04-07 LAB — HEMOGLOBIN A1C: Hemoglobin A1C: 6.8

## 2018-08-11 LAB — CBC AND DIFFERENTIAL
HCT: 44 (ref 41–53)
HCT: 44 (ref 41–53)
Hemoglobin: 14.4 (ref 13.5–17.5)
Hemoglobin: 14.4 (ref 13.5–17.5)
Neutrophils Absolute: 7
PLATELETS: 244 (ref 150–399)
Platelets: 244 (ref 150–399)
WBC: 9
WBC: 9

## 2018-08-11 LAB — IRON,TIBC AND FERRITIN PANEL: Iron: 39

## 2018-09-09 DIAGNOSIS — D508 Other iron deficiency anemias: Secondary | ICD-10-CM | POA: Insufficient documentation

## 2018-09-09 DIAGNOSIS — E785 Hyperlipidemia, unspecified: Secondary | ICD-10-CM | POA: Insufficient documentation

## 2018-09-09 DIAGNOSIS — E1169 Type 2 diabetes mellitus with other specified complication: Secondary | ICD-10-CM | POA: Insufficient documentation

## 2018-09-09 DIAGNOSIS — E782 Mixed hyperlipidemia: Secondary | ICD-10-CM | POA: Insufficient documentation

## 2018-09-09 DIAGNOSIS — K219 Gastro-esophageal reflux disease without esophagitis: Secondary | ICD-10-CM | POA: Insufficient documentation

## 2018-09-19 LAB — TSH
TSH: 1.31 (ref 0.41–5.90)
TSH: 1.31 (ref <=5.90)

## 2018-09-19 LAB — BASIC METABOLIC PANEL
BUN: 15 (ref 4–21)
Creatinine: 0.9 (ref 0.6–1.3)
Glucose: 197
Potassium: 5 (ref 3.4–5.3)
SODIUM: 137 (ref 137–147)

## 2018-09-19 LAB — HEMOGLOBIN A1C: Hemoglobin A1C: 6.6

## 2018-09-19 LAB — VITAMIN B12: Vitamin B-12: 1933

## 2018-09-20 NOTE — Progress Notes (Signed)
[facility]      Assessment and Plan:  COPD with dyspnea on exertion and chronic hypoxic respiratory failure. -Symptoms of COPD/emphysema. -Continue spiriva, symbicort, albuterol.  --Continued dyspnea on exertion, can use albuterol before strenuous activity.    ILD. -CT chest 02/01/18, shows severe emphysema with air trapping, bibasilar fibrotic changes, do not appear to be in a UIP pattern.  Likely related to underlying emphysema, no further testing at this time. -We will continue to monitor.   Chronic hypoxic respiratory failure. -Repeat sleep study 11/27/2017 showed no significant sleep apnea.  However he did have a sleep-related hypoxemia, currently on 2 L of oxygen at night. - Continue oxygen at night.   Return in about 9 months (around 06/23/2019).   Date: 09/20/2018  MRN# [id number] Billy Wise 1946/08/29    Billy Wise is a 72 y.o. old male seen in consultation for chief complaint of:    Chief Complaint  Patient presents with  . COPD    pt has sob with exertion. He wears 2 liters 02 qhs. He completed Pulm Rehab.  . Cough    treated for bronchitis last month; given steroid and abx.    HPI:  Patient is a 72 year old male with a history of exacerbation in January 2019.  He has severe emphysema with bibasilar fibrotic changes.  He has gone through pulmonary rehab, asked to use Symbicort, Spiriva  He has completed pulmonary rehab, and is now in continuation, and feels that he is a lot stronger. He exercises and does not have dyspnea. But continues to have dyspnea on heavy exertion. He is using symbicort 2 puffs bid, spiriva once daily. He uses albuterol neb about once per day. He does have a rescue inhaler that he rarely uses.  He has continues on oxygen on 2L at night.   He was started on CPAP for OSA, but could not get used to the mask. He used a full facial mask.  Repeat sleep study showed no evidence of sleep apnea, and it was  stopped.  He is being maintained on oxygen.   **Repeat sleep study 11/27/17; no sleep apnea.  CBC 10/28/17; eosinophils equals 0. **CT chest personally reviewed, CT hi-res  02/01/18; chest x-ray 10/28/17; there is mild basilar fibrosis, bronchiectatic changes with bronchial thickening, severe apical emphysema. Findings not likely due to UIP.  **PFT tracings personally reviewed from 02/01/18; FVC is 86% predicted, FEV1 is 67% predicted, there is no improvement with bronchodilator.  Ratio 61%. TLC is 106% predicted, RV to TLC ratio is elevated.  DLCO is reduced at 52%.  Flow volume loop is obstructive.  Forced expiratory time is below 6 seconds. -Overall this test is suggestive of obstructive lung disease, however test is suboptimal due to low forced expiratory time. **Desat walk 11/15/17; baseline sat on RA at rest was 91% and HR 89. Walked 360 feet at a very brisk pace, was conversational. Sat was 88% and HR 112.  Moderate dyspnea.  Medication:    Current Outpatient Medications:  .  acidophilus (RISAQUAD) CAPS capsule, Take 1 capsule by mouth daily., Disp: , Rfl:  .  amLODipine (NORVASC) 10 MG tablet, Take 10 mg by mouth daily., Disp: , Rfl: 0 .  aspirin EC 81 MG tablet, Take 81 mg by mouth daily., Disp: , Rfl:  .  BYETTA 10 MCG PEN 10 MCG/0.04ML SOPN injection, Inject 10 mcg into the skin 2 (two) times daily with a meal. , Disp: , Rfl: 0 .  finasteride (PROSCAR) 5 MG tablet, Take 5 mg by mouth daily., Disp: , Rfl: 0 .  glimepiride (AMARYL) 4 MG tablet, Take 4 mg by mouth daily with breakfast. , Disp: , Rfl: 0 .  guaiFENesin-codeine 100-10 MG/5ML syrup, Take 15 mLs by mouth every 4 (four) hours as needed for cough., Disp: 120 mL, Rfl: 0 .  ipratropium-albuterol (DUONEB) 0.5-2.5 (3) MG/3ML SOLN, Take 3 mLs by nebulization every 6 (six) hours., Disp: 360 mL, Rfl: 2 .  LEVEMIR FLEXTOUCH 100 UNIT/ML Pen, Inject 77 Units into the skin at bedtime. , Disp: , Rfl: 0 .  metFORMIN (GLUCOPHAGE) 1000 MG tablet,  Take 1 tablet by mouth 2 (two) times daily., Disp: , Rfl: 0 .  naproxen (NAPROSYN) 500 MG tablet, Take 500 mg by mouth 2 (two) times daily with a meal., Disp: , Rfl:  .  omeprazole (PRILOSEC) 20 MG capsule, Take 20 mg by mouth daily., Disp: , Rfl: 0 .  oxybutynin (DITROPAN-XL) 10 MG 24 hr tablet, Take 10 mg by mouth daily., Disp: , Rfl: 0 .  polycarbophil (FIBERCON) 625 MG tablet, Take 625 mg by mouth every evening., Disp: , Rfl:  .  PROAIR HFA 108 (90 Base) MCG/ACT inhaler, Take 2 puffs by mouth every 4 (four) hours as needed for wheezing. , Disp: , Rfl: 0 .  quinapril (ACCUPRIL) 40 MG tablet, Take 40 mg by mouth daily., Disp: , Rfl: 0 .  simvastatin (ZOCOR) 20 MG tablet, Take 20 mg by mouth every evening., Disp: , Rfl: 0 .  SPIRIVA HANDIHALER 18 MCG inhalation capsule, Take 18 mcg by mouth daily. , Disp: , Rfl: 0 .  SYMBICORT 80-4.5 MCG/ACT inhaler, Take 2 puffs by mouth 2 (two) times daily., Disp: , Rfl: 0 .  tamsulosin (FLOMAX) 0.4 MG CAPS capsule, Take 1 capsule by mouth at bedtime. , Disp: , Rfl: 0 .  traZODone (DESYREL) 50 MG tablet, Take 50 mg by mouth at bedtime., Disp: , Rfl: 0   Allergies:  Patient has no known allergies.      LABORATORY PANEL:   CBC No results for input(s): WBC, HGB, HCT, PLT in the last 168 hours. ------------------------------------------------------------------------------------------------------------------  Chemistries  No results for input(s): NA, K, CL, CO2, GLUCOSE, BUN, CREATININE, CALCIUM, MG, AST, ALT, ALKPHOS, BILITOT in the last 168 hours.  Invalid input(s): GFRCGP ------------------------------------------------------------------------------------------------------------------  Cardiac Enzymes No results for input(s): TROPONINI in the last 168 hours. ------------------------------------------------------------  RADIOLOGY:  No results found.     Thank  you for the consultation and for allowing [facility] to  assist in the care of your patient. Our recommendations are noted above.  Please contact us if we can be of further service.  Wells Guiles, M.D., F.C.C.P.  Board Certified in Internal Medicine, Pulmonary Medicine, Critical Care Medicine, and Sleep Medicine.  [facility] Office Number: [phone]   09/20/2018

## 2018-09-21 ENCOUNTER — Ambulatory Visit: Payer: Medicare Other | Admitting: Internal Medicine

## 2018-09-21 ENCOUNTER — Encounter: Payer: Self-pay | Admitting: Internal Medicine

## 2018-09-21 VITALS — BP 110/78 | HR 121 | Resp 16 | Ht 65.4 in | Wt 183.0 lb

## 2018-09-21 DIAGNOSIS — J449 Chronic obstructive pulmonary disease, unspecified: Secondary | ICD-10-CM | POA: Diagnosis not present

## 2018-09-21 DIAGNOSIS — J849 Interstitial pulmonary disease, unspecified: Secondary | ICD-10-CM

## 2018-09-21 NOTE — Patient Instructions (Signed)
Continue using current inhalers.  Try using rescue inhaler before strenuous activity to see if it helps.

## 2018-09-29 ENCOUNTER — Ambulatory Visit
Admission: RE | Admit: 2018-09-29 | Discharge: 2018-09-29 | Disposition: A | Discharge status: Home / Self Care | Payer: Medicare Other | Source: Ambulatory Visit | Attending: Family Medicine | Admitting: Family Medicine

## 2018-09-29 ENCOUNTER — Encounter: Payer: Self-pay | Admitting: Family Medicine

## 2018-09-29 ENCOUNTER — Ambulatory Visit
Admission: RE | Admit: 2018-09-29 | Discharge: 2018-09-29 | Disposition: A | Discharge status: Home / Self Care | Payer: Medicare Other | Attending: Family Medicine | Admitting: Family Medicine

## 2018-09-29 ENCOUNTER — Ambulatory Visit: Payer: Medicare Other | Admitting: Family Medicine

## 2018-09-29 VITALS — BP 124/69 | HR 91 | Temp 98.0°F | Ht 65.0 in | Wt 181.8 lb

## 2018-09-29 DIAGNOSIS — N401 Enlarged prostate with lower urinary tract symptoms: Secondary | ICD-10-CM

## 2018-09-29 DIAGNOSIS — Z794 Long term (current) use of insulin: Secondary | ICD-10-CM

## 2018-09-29 DIAGNOSIS — Z1211 Encounter for screening for malignant neoplasm of colon: Secondary | ICD-10-CM

## 2018-09-29 DIAGNOSIS — R059 Cough, unspecified: Secondary | ICD-10-CM

## 2018-09-29 DIAGNOSIS — E1165 Type 2 diabetes mellitus with hyperglycemia: Secondary | ICD-10-CM

## 2018-09-29 DIAGNOSIS — G4700 Insomnia, unspecified: Secondary | ICD-10-CM

## 2018-09-29 DIAGNOSIS — R0989 Other specified symptoms and signs involving the circulatory and respiratory systems: Secondary | ICD-10-CM | POA: Diagnosis present

## 2018-09-29 DIAGNOSIS — R05 Cough: Secondary | ICD-10-CM | POA: Diagnosis present

## 2018-09-29 DIAGNOSIS — I10 Essential (primary) hypertension: Secondary | ICD-10-CM

## 2018-09-29 DIAGNOSIS — K219 Gastro-esophageal reflux disease without esophagitis: Secondary | ICD-10-CM

## 2018-09-29 DIAGNOSIS — J42 Unspecified chronic bronchitis: Secondary | ICD-10-CM

## 2018-09-29 DIAGNOSIS — E782 Mixed hyperlipidemia: Secondary | ICD-10-CM

## 2018-09-29 DIAGNOSIS — N138 Other obstructive and reflux uropathy: Secondary | ICD-10-CM

## 2018-09-29 NOTE — Progress Notes (Signed)
Patient: Billy Wise, Male    DOB: 06/26/1946, 72 y.o.   MRN: [id number] Visit Date: 09/29/2018  Today's Provider: Shirlee Latch, MD   Chief Complaint  Patient presents with  . Establish Care   Subjective:  I, Presley Raddle, CMA, am acting as a scribe for Shirlee Latch, MD.    New Patient:  Billy Wise is a 72 y.o. male who presents today to Establish Care. He was previously being seen at [facility].  He feels fairly well.  He states he was seen at [facility] on 09/09/2018. He was prescribed antibiotic and steroids. The symptoms improved, but have returned. He C/O congestion and fatigue.  He is concerned about his cough and congestion.  He has had 2 episodes of pneumonia requiring hospitalization within the last 2 years.  He has known COPD.  He has shortness of breath at baseline with exertion, and this has not worsened.  He wears 2 L of oxygen at night and this has not changed.  He reports negative sleep study for OSA in the past.  He is followed by pulmonology and has done pulmonary rehab in the past.  He has been taking albuterol multiple times per day and Mucinex without much improvement.  He feels weak.  He does not have any fevers   He reports exercising 3 days per week at the hospital and 2 days per week at home.Marland Kitchen He reports he is sleeping fairly well. He reports several nighttime awakenings.   Patient states his been 10 years since his last colonoscopy, which was reportedly normal.  He would like referral to a gastroenterologist in the area to have his next screening colonoscopy.  T2DM: Patient is followed by Dr. Okey Dupre with endocrinology with [facility] in [place].  He is moving several of his specialist to the area, but believes he will continue to see Dr. Jenean Lindau as he does not have to see him all that often.  He is taking Byetta, glimepiride, Levemir 80 units nightly, metformin  HTN: Patient is taking quinapril and amlodipine with good compliance.  Not  checking home blood pressures. Denies any SOB, CP, vision changes, LE edema, medication SEs, or symptoms of hypotension.  Reports low-sodium diet  Had abnormal stress test years ago, but only had 2 areas of <10% blockage on cardiac cath.  Insomnia: Takes Ambien nightly for many years.  States this helps him get to sleep initially, but he still does not maintain sleep well.  He states he cannot sleep without it.  He is a very light sleeper.  He tried trazodone in the past and this did not help.  He believes his insurance is going to require PA for this at the beginning of the year.  BPH: Patient was previously followed by urology with [facility].  He was found to have a bladder stone due to hematuria which was removed.  He has not had any recurrence of hematuria since that time.  He was also started on finasteride and Flomax for BPH symptoms.  His incontinence has improved.  He is also taking Ditropan for this.  He does have nocturia which has not changed.  He denies any hesitancy, dribbling, straining.  AK's: He was followed annually by dermatologist in [place].  He has had several rounds of cryotherapy for AK's.  He did have to have one on his finger excised.  He will continue to be seen in [facility] for his annual skin check.  GERD: States his symptoms are  fairly well-controlled with omeprazole.  He is taking probiotics. -----------------------------------------------------------------   Review of Systems  Constitutional: Positive for fatigue.  HENT: Positive for congestion.   Eyes: Negative.   Respiratory: Positive for shortness of breath.   Cardiovascular: Negative.   Gastrointestinal: Positive for diarrhea.  Endocrine: Negative.   Genitourinary: Negative.   Musculoskeletal: Negative.   Skin: Negative.   Allergic/Immunologic: Negative.   Neurological: Negative.   Hematological: Negative.   Psychiatric/Behavioral: Negative.     Social History      He  reports that he quit  smoking about 10 years ago. His smoking use included cigarettes. He has a 75.00 pack-year smoking history. He has never used smokeless tobacco. He reports that he does not drink alcohol or use drugs.       Social History   Socioeconomic History  . Marital status: Married    Spouse name: Not on file  . Number of children: Not on file  . Years of education: Not on file  . Highest education level: Not on file  Occupational History  . Not on file  Social Needs  . Financial resource strain: Not on file  . Food insecurity:    Worry: Not on file    Inability: Not on file  . Transportation needs:    Medical: Not on file    Non-medical: Not on file  Tobacco Use  . Smoking status: Former Smoker    Packs/day: 1.50    Years: 50.00    Pack years: 75.00    Types: Cigarettes    Last attempt to quit: 09/29/2008    Years since quitting: 10.0  . Smokeless tobacco: Never Used  Substance and Sexual Activity  . Alcohol use: No    Frequency: Never  . Drug use: No  . Sexual activity: Not on file  Lifestyle  . Physical activity:    Days per week: Not on file    Minutes per session: Not on file  . Stress: Not on file  Relationships  . Social connections:    Talks on phone: Not on file    Gets together: Not on file    Attends religious service: Not on file    Active member of club or organization: Not on file    Attends meetings of clubs or organizations: Not on file    Relationship status: Not on file  Other Topics Concern  . Not on file  Social History Narrative  . Not on file    Past Medical History:  Diagnosis Date  . Actinic keratoses   . BPH (benign prostatic hyperplasia)   . CKD stage 1 due to type 2 diabetes mellitus (HCC)   . COPD (chronic obstructive pulmonary disease) (HCC)   . Diabetes mellitus without complication (HCC)   . GERD (gastroesophageal reflux disease)   . Hypertension   . Insomnia   . Iron deficiency anemia   . Mixed hyperlipidemia   . OSA (obstructive  sleep apnea)   . Oxygen deficiency      Patient Active Problem List   Diagnosis Date Noted  . Iron deficiency anemia secondary to inadequate dietary iron intake 09/09/2018  . Gastroesophageal reflux disease without esophagitis 09/09/2018  . Mixed hyperlipidemia 09/09/2018  . Sepsis (HCC) 10/28/2017  . Secondary bacterial pneumonia 10/28/2017  . Influenza A 10/28/2017  . Diabetes (HCC) 10/28/2017  . COPD with acute exacerbation (HCC) 10/28/2017  . HTN (hypertension) 10/28/2017  . OSA (obstructive sleep apnea) 10/28/2017  . Insomnia 09/03/2016  . BPH  with obstruction/lower urinary tract symptoms 12/16/2015  . CKD (chronic kidney disease) stage 1, GFR 90 ml/min or greater 04/24/2014    Past Surgical History:  Procedure Laterality Date  . CARDIAC CATHETERIZATION    . HERNIA REPAIR      Family History        Family Status  Relation Name Status  . Sister  (Not Specified)        His family history includes Heart attack in his sister; Hypertension in his sister.      No Known Allergies   Current Outpatient Medications:  .  acidophilus (RISAQUAD) CAPS capsule, Take 1 capsule by mouth daily., Disp: , Rfl:  .  amLODipine (NORVASC) 10 MG tablet, Take 10 mg by mouth daily., Disp: , Rfl: 0 .  ascorbic acid (VITAMIN C) 1000 MG tablet, Take 1 tablet by mouth daily., Disp: , Rfl:  .  aspirin EC 81 MG tablet, Take 81 mg by mouth daily., Disp: , Rfl:  .  BYETTA 10 MCG PEN 10 MCG/0.04ML SOPN injection, Inject 10 mcg into the skin 2 (two) times daily with a meal. , Disp: , Rfl: 0 .  finasteride (PROSCAR) 5 MG tablet, Take 5 mg by mouth daily., Disp: , Rfl: 0 .  glimepiride (AMARYL) 4 MG tablet, Take 4 mg by mouth daily with breakfast. , Disp: , Rfl: 0 .  ipratropium-albuterol (DUONEB) 0.5-2.5 (3) MG/3ML SOLN, Take 3 mLs by nebulization every 6 (six) hours., Disp: 360 mL, Rfl: 2 .  IRON PO, Take 1,000 mg by mouth daily., Disp: , Rfl:  .  Lactobacillus (PROBIOTIC ACIDOPHILUS PO), Take 1  capsule by mouth daily., Disp: , Rfl:  .  LEVEMIR FLEXTOUCH 100 UNIT/ML Pen, Inject 77 Units into the skin at bedtime. , Disp: , Rfl: 0 .  metFORMIN (GLUCOPHAGE) 1000 MG tablet, Take 1 tablet by mouth 2 (two) times daily., Disp: , Rfl: 0 .  omeprazole (PRILOSEC) 20 MG capsule, Take 20 mg by mouth daily., Disp: , Rfl: 0 .  oxybutynin (DITROPAN-XL) 10 MG 24 hr tablet, Take 10 mg by mouth daily., Disp: , Rfl: 0 .  polycarbophil (FIBERCON) 625 MG tablet, Take 625 mg by mouth every evening., Disp: , Rfl:  .  Potassium 99 MG TABS, Take by mouth., Disp: , Rfl:  .  PROAIR HFA 108 (90 Base) MCG/ACT inhaler, Take 2 puffs by mouth every 4 (four) hours as needed for wheezing. , Disp: , Rfl: 0 .  quinapril (ACCUPRIL) 40 MG tablet, Take 40 mg by mouth daily., Disp: , Rfl: 0 .  simvastatin (ZOCOR) 20 MG tablet, Take 20 mg by mouth every evening., Disp: , Rfl: 0 .  SPIRIVA HANDIHALER 18 MCG inhalation capsule, Take 18 mcg by mouth daily. , Disp: , Rfl: 0 .  SYMBICORT 80-4.5 MCG/ACT inhaler, Take 2 puffs by mouth 2 (two) times daily., Disp: , Rfl: 0 .  tamsulosin (FLOMAX) 0.4 MG CAPS capsule, Take 1 capsule by mouth at bedtime. , Disp: , Rfl: 0 .  vitamin B-12 (CYANOCOBALAMIN) 1000 MCG tablet, Take 1,000 mcg by mouth daily., Disp: , Rfl:  .  zolpidem (AMBIEN) 10 MG tablet, Take 10 mg by mouth at bedtime., Disp: , Rfl:  .  guaiFENesin-codeine 100-10 MG/5ML syrup, Take 15 mLs by mouth every 4 (four) hours as needed for cough. (Patient not taking: Reported on 09/29/2018), Disp: 120 mL, Rfl: 0   Patient Care Team: Rosalio Macadamia, MD as PCP - General (Internal Medicine)      Objective:  Vitals: BP 124/69 (BP Location: Right Arm, Patient Position: Sitting, Cuff Size: Normal)   Pulse 91   Temp 98 F (36.7 C) (Oral)   Ht 5\' 5"  (1.651 m)   Wt 181 lb 12.8 oz (82.5 kg)   SpO2 93%   BMI 30.25 kg/m    Vitals:   09/29/18 0914  BP: 124/69  Pulse: 91  Temp: 98 F (36.7 C)  TempSrc: Oral  SpO2: 93%  Weight:  181 lb 12.8 oz (82.5 kg)  Height: 5\' 5"  (1.651 m)     Physical Exam Vitals signs reviewed.  Constitutional:      General: He is not in acute distress.    Appearance: Normal appearance. He is well-developed. He is not diaphoretic.  HENT:     Head: Normocephalic and atraumatic.     Right Ear: Tympanic membrane, ear canal and external ear normal.     Left Ear: Tympanic membrane, ear canal and external ear normal.     Nose: Nose normal.     Mouth/Throat:     Mouth: Mucous membranes are moist.     Pharynx: Oropharynx is clear. No oropharyngeal exudate.  Eyes:     General: No scleral icterus.    Conjunctiva/sclera: Conjunctivae normal.     Pupils: Pupils are equal, round, and reactive to light.  Neck:     Musculoskeletal: Neck supple.     Thyroid: No thyromegaly.  Cardiovascular:     Rate and Rhythm: Normal rate and regular rhythm.     Pulses: Normal pulses.     Heart sounds: Normal heart sounds. No murmur.  Pulmonary:     Effort: Pulmonary effort is normal. No respiratory distress.     Breath sounds: Rales (in left base) present. No wheezing.  Chest:     Chest wall: No tenderness.  Abdominal:     General: Bowel sounds are normal. There is no distension.     Palpations: Abdomen is soft.     Tenderness: There is no abdominal tenderness. There is no guarding or rebound.  Musculoskeletal:        General: No deformity.     Right lower leg: No edema.     Left lower leg: No edema.  Lymphadenopathy:     Cervical: No cervical adenopathy.  Skin:    General: Skin is warm and dry.     Capillary Refill: Capillary refill takes less than 2 seconds.     Findings: No rash.  Neurological:     Mental Status: He is alert and oriented to person, place, and time.  Psychiatric:        Mood and Affect: Mood normal.        Behavior: Behavior normal.        Thought Content: Thought content normal.      Depression Screen PHQ 2/9 Scores 09/29/2018 03/11/2018 12/13/2017 11/30/2017  PHQ - 2  Score 0 0 3 3  PHQ- 9 Score 0 1 7 9    Reviewed recent labs from [facility] from earlier this month, including metabolic panel, CBC, lipid panel   Assessment & Plan:     Establish care  Exercise Activities and Dietary recommendations Goals   None     Immunization History  Administered Date(s) Administered  . Influenza, High Dose Seasonal PF 07/24/2015, 07/21/2016, 08/02/2017, 08/06/2017, 07/27/2018  . Influenza-Unspecified 09/14/2012  . Pneumococcal Conjugate-13 08/25/2014  . Pneumococcal Polysaccharide-23 01/26/2016  . Pneumococcal-Unspecified 12/17/2005  . Tdap 12/17/2005, 02/25/2016  . Zoster 11/24/2013    Health Maintenance  Topic Date Due  . Hepatitis C Screening  [birth year]  . FOOT EXAM  10/12/1958  . OPHTHALMOLOGY EXAM  10/12/1958  . COLONOSCOPY  10/12/1998  . HEMOGLOBIN A1C  04/28/2018  . TETANUS/TDAP  02/24/2026  . INFLUENZA VACCINE  Completed  . PNA vac Low Risk Adult  Completed     Discussed health benefits of physical activity, and encouraged him to engage in regular exercise appropriate for his age and condition.    --------------------------------------------------------------------  Problem List Items Addressed This Visit      Cardiovascular and Mediastinum   HTN (hypertension)    Well-controlled Continue current medications Reviewed recent metabolic panel Follow-up in 6 months        Respiratory   COPD (chronic obstructive pulmonary disease) (HCC)    Chronic and stable No signs of current exacerbation as he has no wheezing Continue current medications including Spiriva, Symbicort, albuterol as needed Continue to follow with pulmonology Return precautions discussed        Digestive   Gastroesophageal reflux disease without esophagitis    Chronic and stable Continue omeprazole Discussed dietary and lifestyle changes         Endocrine   T2DM (type 2 diabetes mellitus) (HCC)    Well-controlled with last A1c of 6.6 Needs foot exam at  next visit Followed by endocrinology We will try to obtain ophthalmology exam Up-to-date on vaccinations        Genitourinary   BPH with obstruction/lower urinary tract symptoms    Well-controlled Does not need urology follow-up at this time We will check annual PSA Reviewed last PSA that was well controlled 0.6 Continue Flomax and finasteride at current dose        Other   Insomnia    Chronic and stable Discussed risks of long-term Ambien use Continue Ambien 10 mg nightly We will complete PA if necessary for insurance      Mixed hyperlipidemia    Reviewed last lipid panel with well-controlled LDL Continue simvastatin at current dose       Other Visit Diagnoses    Respiratory crackles at left lung base    -  Primary   Relevant Orders   DG Chest 2 View (Completed)   Cough    -Given cough, increased sputum production, and abnormal lung exam, concern for possible pneumonia -No wheezes to suggest COPD exacerbation -No history of heart failure -We will obtain chest x-ray to evaluate further -Possible antibiotics pending chest x-ray results -Discussed strict return precautions   Relevant Orders   DG Chest 2 View (Completed)    Screen for colon cancer       Relevant Orders   Ambulatory referral to Gastroenterology       Return in about 6 months (around 03/31/2019) for AWV/CPE.   The entirety of the information documented in the History of Present Illness, Review of Systems and Physical Exam were personally obtained by me. Portions of this information were initially documented by Yevonne PaxNikki Wahlstrom, CMA and reviewed by me for thoroughness and accuracy.    Erasmo DownerBacigalupo, Gianelle Mccaul M, MD, MPH [facility] 09/30/2018 4:05 PM

## 2018-09-30 ENCOUNTER — Telehealth: Payer: Self-pay | Admitting: Family Medicine

## 2018-09-30 ENCOUNTER — Other Ambulatory Visit: Payer: Self-pay

## 2018-09-30 ENCOUNTER — Other Ambulatory Visit: Payer: Self-pay | Admitting: Family Medicine

## 2018-09-30 DIAGNOSIS — Z1211 Encounter for screening for malignant neoplasm of colon: Secondary | ICD-10-CM

## 2018-09-30 MED ORDER — DOXYCYCLINE HYCLATE 100 MG PO TABS: 100.0000 mg | ORAL_TABLET | Freq: Two times a day (BID) | ORAL | 0 refills | Status: AC

## 2018-09-30 MED ORDER — HYDROCODONE-HOMATROPINE 5-1.5 MG/5ML PO SYRP: 5.0000 mL | ORAL_SOLUTION | Freq: Three times a day (TID) | ORAL | 0 refills | Status: DC | PRN

## 2018-09-30 NOTE — Assessment & Plan Note (Signed)
Well-controlled with last A1c of 6.6 Needs foot exam at next visit Followed by endocrinology We will try to obtain ophthalmology exam Up-to-date on vaccinations

## 2018-09-30 NOTE — Telephone Encounter (Signed)
Patient wants chest xray results and ask if he can get a cough medicine called in.

## 2018-09-30 NOTE — Assessment & Plan Note (Signed)
Well-controlled Does not need urology follow-up at this time We will check annual PSA Reviewed last PSA that was well controlled 0.6 Continue Flomax and finasteride at current dose

## 2018-09-30 NOTE — Assessment & Plan Note (Signed)
Well-controlled Continue current medications Reviewed recent metabolic panel Follow-up in 6 months 

## 2018-09-30 NOTE — Telephone Encounter (Signed)
Patient advised.

## 2018-09-30 NOTE — Assessment & Plan Note (Signed)
Chronic and stable No signs of current exacerbation as he has no wheezing Continue current medications including Spiriva, Symbicort, albuterol as needed Continue to follow with pulmonology Return precautions discussed

## 2018-09-30 NOTE — Assessment & Plan Note (Signed)
Reviewed last lipid panel with well-controlled LDL Continue simvastatin at current dose

## 2018-09-30 NOTE — Assessment & Plan Note (Signed)
Chronic and stable Continue omeprazole Discussed dietary and lifestyle changes. 

## 2018-09-30 NOTE — Telephone Encounter (Signed)
Hycodan sent to pharmacy 

## 2018-09-30 NOTE — Telephone Encounter (Signed)
Patient advised of CXR results. He is requesting cough medication be sent to pharmacy.

## 2018-09-30 NOTE — Assessment & Plan Note (Signed)
Chronic and stable Discussed risks of long-term Ambien use Continue Ambien 10 mg nightly We will complete PA if necessary for insurance

## 2018-10-18 ENCOUNTER — Encounter
Admission: RE | Disposition: A | Discharge status: Home / Self Care | Payer: Self-pay | Source: Home / Self Care | Attending: Gastroenterology

## 2018-10-18 ENCOUNTER — Ambulatory Visit
Admission: RE | Admit: 2018-10-18 | Discharge: 2018-10-18 | Disposition: A | Discharge status: Home / Self Care | Payer: Medicare Other | Attending: Gastroenterology | Admitting: Gastroenterology

## 2018-10-18 ENCOUNTER — Encounter: Payer: Self-pay | Admitting: *Deleted

## 2018-10-18 ENCOUNTER — Ambulatory Visit: Payer: Medicare Other | Admitting: Certified Registered"

## 2018-10-18 DIAGNOSIS — Z1211 Encounter for screening for malignant neoplasm of colon: Secondary | ICD-10-CM

## 2018-10-18 DIAGNOSIS — I10 Essential (primary) hypertension: Secondary | ICD-10-CM | POA: Insufficient documentation

## 2018-10-18 DIAGNOSIS — K5730 Diverticulosis of large intestine without perforation or abscess without bleeding: Secondary | ICD-10-CM | POA: Insufficient documentation

## 2018-10-18 DIAGNOSIS — Z87891 Personal history of nicotine dependence: Secondary | ICD-10-CM | POA: Diagnosis not present

## 2018-10-18 DIAGNOSIS — J449 Chronic obstructive pulmonary disease, unspecified: Secondary | ICD-10-CM | POA: Diagnosis not present

## 2018-10-18 DIAGNOSIS — Z794 Long term (current) use of insulin: Secondary | ICD-10-CM | POA: Diagnosis not present

## 2018-10-18 DIAGNOSIS — D509 Iron deficiency anemia, unspecified: Secondary | ICD-10-CM | POA: Insufficient documentation

## 2018-10-18 DIAGNOSIS — G4700 Insomnia, unspecified: Secondary | ICD-10-CM | POA: Insufficient documentation

## 2018-10-18 DIAGNOSIS — Z7951 Long term (current) use of inhaled steroids: Secondary | ICD-10-CM | POA: Diagnosis not present

## 2018-10-18 DIAGNOSIS — Z7982 Long term (current) use of aspirin: Secondary | ICD-10-CM | POA: Insufficient documentation

## 2018-10-18 DIAGNOSIS — N400 Enlarged prostate without lower urinary tract symptoms: Secondary | ICD-10-CM | POA: Diagnosis not present

## 2018-10-18 DIAGNOSIS — Z79899 Other long term (current) drug therapy: Secondary | ICD-10-CM | POA: Diagnosis not present

## 2018-10-18 DIAGNOSIS — E782 Mixed hyperlipidemia: Secondary | ICD-10-CM | POA: Insufficient documentation

## 2018-10-18 DIAGNOSIS — K219 Gastro-esophageal reflux disease without esophagitis: Secondary | ICD-10-CM | POA: Diagnosis not present

## 2018-10-18 DIAGNOSIS — Z9981 Dependence on supplemental oxygen: Secondary | ICD-10-CM | POA: Diagnosis not present

## 2018-10-18 DIAGNOSIS — E119 Type 2 diabetes mellitus without complications: Secondary | ICD-10-CM | POA: Insufficient documentation

## 2018-10-18 HISTORY — PX: COLONOSCOPY WITH PROPOFOL: SHX5780

## 2018-10-18 LAB — GLUCOSE, CAPILLARY: Glucose-Capillary: 158 mg/dL — ABNORMAL HIGH (ref 70–99)

## 2018-10-18 SURGERY — COLONOSCOPY WITH PROPOFOL
Anesthesia: General

## 2018-10-18 MED ORDER — PROPOFOL 500 MG/50ML IV EMUL
INTRAVENOUS | Status: DC | PRN
  Administered 2018-10-18: 150 ug/kg/min via INTRAVENOUS

## 2018-10-18 MED ORDER — SODIUM CHLORIDE 0.9 % IV SOLN
INTRAVENOUS | Status: DC
  Administered 2018-10-18: 1000 mL via INTRAVENOUS

## 2018-10-18 MED ORDER — PROPOFOL 10 MG/ML IV BOLUS
INTRAVENOUS | Status: DC | PRN
  Administered 2018-10-18: 50 mg via INTRAVENOUS

## 2018-10-18 MED ORDER — PROPOFOL 10 MG/ML IV BOLUS
INTRAVENOUS | Status: AC
  Filled 2018-10-18: qty 40

## 2018-10-18 NOTE — H&P (Signed)
Billy Miniumarren Dushaun Okey, MD [facility] [address] [place], [place] [address] Phone: [phone] Fax : [phone]  Primary Care Physician:  Billy DownerBacigalupo, Angela M, MD Primary Gastroenterologist:  Dr. Servando Wise  Pre-Procedure History & Physical: HPI:  Billy Wise is a 72 y.o. male is here for a screening colonoscopy.   Past Medical History:  Diagnosis Date  . Actinic keratoses   . BPH (benign prostatic hyperplasia)   . COPD (chronic obstructive pulmonary disease) (HCC)   . Diabetes mellitus without complication (HCC)   . GERD (gastroesophageal reflux disease)   . Hypertension   . Insomnia   . Iron deficiency anemia   . Mixed hyperlipidemia   . Oxygen deficiency     Past Surgical History:  Procedure Laterality Date  . CARDIAC CATHETERIZATION    . CHOLECYSTECTOMY    . HERNIA REPAIR      Prior to Admission medications   Medication Sig Start Date End Date Taking? Authorizing Provider  acidophilus (RISAQUAD) CAPS capsule Take 1 capsule by mouth daily.   Yes [provider]  amLODipine (NORVASC) 10 MG tablet Take 10 mg by mouth daily. 09/28/17  Yes [provider]  ascorbic acid (VITAMIN C) 1000 MG tablet Take 1 tablet by mouth daily.   Yes [provider]  aspirin EC 81 MG tablet Take 81 mg by mouth daily.   Yes [provider]  BYETTA 10 MCG PEN 10 MCG/0.04ML SOPN injection Inject 10 mcg into the skin 2 (two) times daily with a meal.  09/29/17  Yes [provider]  finasteride (PROSCAR) 5 MG tablet Take 5 mg by mouth daily. 08/06/17  Yes [provider]  glimepiride (AMARYL) 4 MG tablet Take 4 mg by mouth daily with breakfast.  09/28/17  Yes [provider]  IRON PO Take 1,000 mg by mouth daily.   Yes [provider]  Lactobacillus (PROBIOTIC ACIDOPHILUS PO) Take 1 capsule by mouth daily.   Yes [provider]  LEVEMIR FLEXTOUCH 100 UNIT/ML Pen Inject 80 Units into the skin at bedtime. 09/29/17  Yes  [provider]  metFORMIN (GLUCOPHAGE) 1000 MG tablet Take 1 tablet by mouth 2 (two) times daily. 09/24/17  Yes [provider]  omeprazole (PRILOSEC) 20 MG capsule Take 20 mg by mouth daily. 09/24/17  Yes [provider]  oxybutynin (DITROPAN-XL) 10 MG 24 hr tablet Take 10 mg by mouth daily. 09/28/17  Yes [provider]  polycarbophil (FIBERCON) 625 MG tablet Take 625 mg by mouth every evening.   Yes [provider]  Potassium 99 MG TABS Take by mouth.   Yes [provider]  quinapril (ACCUPRIL) 40 MG tablet Take 40 mg by mouth daily. 09/28/17  Yes [provider]  simvastatin (ZOCOR) 20 MG tablet Take 20 mg by mouth every evening. 09/28/17  Yes [provider]  SPIRIVA HANDIHALER 18 MCG inhalation capsule Take 18 mcg by mouth daily.  10/07/17  Yes [provider]  SYMBICORT 80-4.5 MCG/ACT inhaler Take 2 puffs by mouth 2 (two) times daily. 09/29/17  Yes [provider]  tamsulosin (FLOMAX) 0.4 MG CAPS capsule Take 1 capsule by mouth at bedtime.  08/06/17  Yes [provider]  vitamin B-12 (CYANOCOBALAMIN) 1000 MCG tablet Take 1,000 mcg by mouth daily.   Yes [provider]  zolpidem (AMBIEN) 10 MG tablet Take 10 mg by mouth at bedtime. 04/13/18  Yes [provider]  HYDROcodone-homatropine (HYCODAN) 5-1.5 MG/5ML syrup Take 5 mLs by mouth every  8 (eight) hours as needed for cough. Patient not taking: Reported on 10/18/2018 09/30/18   Billy DownerBacigalupo, Angela M, MD  ipratropium-albuterol (DUONEB) 0.5-2.5 (3) MG/3ML SOLN Take 3 mLs by nebulization every 6 (six) hours. 11/01/17   Auburn BilberryPatel, Shreyang, MD  PROAIR HFA 108 [phone](90 Base) MCG/ACT inhaler Take 2 puffs by mouth every 4 (four) hours as needed for wheezing.  10/15/17   [provider]    Allergies as of 09/30/2018  . (No Known Allergies)    Family History  Problem Relation Age of Onset  . Heart attack Sister   . Hypertension  Sister   . Gout Father     Social History   Socioeconomic History  . Marital status: Married    Spouse name: Not on file  . Number of children: 2  . Years of education: Not on file  . Highest education level: Not on file  Occupational History  . Not on file  Social Needs  . Financial resource strain: Not on file  . Food insecurity:    Worry: Not on file    Inability: Not on file  . Transportation needs:    Medical: Not on file    Non-medical: Not on file  Tobacco Use  . Smoking status: Former Smoker    Packs/day: 1.50    Years: 50.00    Pack years: 75.00    Types: Cigarettes    Last attempt to quit: 09/29/2008    Years since quitting: 10.0  . Smokeless tobacco: Never Used  Substance and Sexual Activity  . Alcohol use: Yes    Alcohol/week: 0.0 - 1.0 standard drinks    Frequency: Never  . Drug use: No  . Sexual activity: Yes    Partners: Female    Birth control/protection: None  Lifestyle  . Physical activity:    Days per week: Not on file    Minutes per session: Not on file  . Stress: Not on file  Relationships  . Social connections:    Talks on phone: Not on file    Gets together: Not on file    Attends religious service: Not on file    Active member of club or organization: Not on file    Attends meetings of clubs or organizations: Not on file    Relationship status: Not on file  . Intimate partner violence:    Fear of current or ex partner: Not on file    Emotionally abused: Not on file    Physically abused: Not on file    Forced sexual activity: Not on file  Other Topics Concern  . Not on file  Social History Narrative  . Not on file    Review of Systems: See HPI, otherwise negative ROS  Physical Exam: BP 135/79   Pulse 81   Temp (!) 96.6 F (35.9 C) (Tympanic)   Resp 20   Ht 5\' 5"  (1.651 Wise)   Wt 78.9 kg   SpO2 92%   BMI 28.96 kg/Wise  General:   Alert,  pleasant and cooperative in NAD Head:  Normocephalic and atraumatic. Neck:  Supple;  no masses or thyromegaly. Lungs:  Clear throughout to auscultation.    Heart:  Regular rate and rhythm. Abdomen:  Soft, nontender and nondistended. Normal bowel sounds, without guarding, and without rebound.   Neurologic:  Alert and  oriented x4;  grossly normal neurologically.  Impression/Plan: Billy Wise is now here to undergo a screening colonoscopy.  Risks, benefits, and alternatives regarding colonoscopy have  been reviewed with the patient.  Questions have been answered.  All parties agreeable.

## 2018-10-18 NOTE — Anesthesia Post-op Follow-up Note (Signed)
Anesthesia QCDR form completed.        

## 2018-10-18 NOTE — Anesthesia Postprocedure Evaluation (Signed)
Anesthesia Post Note  Patient: Billy DeisSteven J Wise  Procedure(s) Performed: COLONOSCOPY WITH PROPOFOL (N/A )  Patient location during evaluation: Endoscopy Anesthesia Type: General Level of consciousness: awake and alert and oriented Pain management: pain level controlled Vital Signs Assessment: post-procedure vital signs reviewed and stable Respiratory status: spontaneous breathing, nonlabored ventilation and respiratory function stable Cardiovascular status: blood pressure returned to baseline and stable Postop Assessment: no signs of nausea or vomiting Anesthetic complications: no     Last Vitals:  Vitals:   10/18/18 1218 10/18/18 1228  BP: 129/78 131/80  Pulse: 77 76  Resp: (!) 21 19  Temp:    SpO2: 93% 95%    Last Pain:  Vitals:   10/18/18 1228  TempSrc:   PainSc: 0-No pain                 Shareef Eddinger

## 2018-10-18 NOTE — Transfer of Care (Signed)
Immediate Anesthesia Transfer of Care Note  Patient: Billy Wise  Procedure(s) Performed: COLONOSCOPY WITH PROPOFOL (N/A )  Patient Location: Endoscopy Unit  Anesthesia Type:General  Level of Consciousness: awake, alert  and oriented  Airway & Oxygen Therapy: Patient Spontanous Breathing and Patient connected to nasal cannula oxygen  Post-op Assessment: Report given to RN and Post -op Vital signs reviewed and stable  Post vital signs: Reviewed and stable  Last Vitals:  Vitals Value Taken Time  BP    Temp    Pulse    Resp    SpO2      Last Pain:  Vitals:   10/18/18 1027  TempSrc: Tympanic  PainSc: 0-No pain         Complications: No apparent anesthesia complications

## 2018-10-18 NOTE — Op Note (Signed)
[facility] Gastroenterology Patient Name: Billy Wise Procedure Date: 10/18/2018 11:37 AM MRN: [id number] Account #: [id number] Date of Birth: August 15, 1946 Admit Type: Outpatient Age: 72 Room: [facility] ENDO ROOM 4 Gender: Male Note Status: Finalized Procedure:            Colonoscopy Indications:          Screening for colorectal malignant neoplasm Providers:            Midge Minium MD, MD Referring MD:         Marzella Schlein. Bacigalupo (Referring MD) Medicines:            Propofol per Anesthesia Complications:        No immediate complications. Procedure:            Pre-Anesthesia Assessment:                       - Prior to the procedure, a History and Physical was                        performed, and patient medications and allergies were                        reviewed. The patient's tolerance of previous                        anesthesia was also reviewed. The risks and benefits of                        the procedure and the sedation options and risks were                        discussed with the patient. All questions were                        answered, and informed consent was obtained. Prior                        Anticoagulants: The patient has taken no previous                        anticoagulant or antiplatelet agents. ASA Grade                        Assessment: II - A patient with mild systemic disease.                        After reviewing the risks and benefits, the patient was                        deemed in satisfactory condition to undergo the                        procedure.                       After obtaining informed consent, the colonoscope was                        passed under direct vision. Throughout the procedure,  the patient's blood pressure, pulse, and oxygen                        saturations were monitored continuously. The                        Colonoscope was introduced through the anus and              advanced to the the cecum, identified by appendiceal                        orifice and ileocecal valve. The colonoscopy was                        performed without difficulty. The patient tolerated the                        procedure well. The quality of the bowel preparation                        was excellent. Findings:      The perianal and digital rectal examinations were normal.      A few small-mouthed diverticula were found in the sigmoid colon. Impression:           - Diverticulosis in the sigmoid colon.                       - No specimens collected. Recommendation:       - Discharge patient to home.                       - Resume previous diet.                       - Continue present medications.                       - Repeat colonoscopy in 10 years for screening unless                        any change in family history or lower GI problems. Procedure Code(s):    --- Professional ---                       [phone], Colonoscopy, flexible; diagnostic, including                        collection of specimen(s) by brushing or washing, when                        performed (separate procedure) Diagnosis Code(s):    --- Professional ---                       Z12.11, Encounter for screening for malignant neoplasm                        of colon CPT copyright 2018 American Medical Association. All rights reserved. The codes documented in this report are preliminary and upon coder review may  be revised to meet current compliance requirements. [name] MD, MD 10/18/2018 11:54:00 AM This report has been signed electronically. Number of  Addenda: 0 Note Initiated On: 10/18/2018 11:37 AM Scope Withdrawal Time: 0 hours 6 minutes 40 seconds  Total Procedure Duration: 0 hours 8 minutes 37 seconds       [facility]

## 2018-10-18 NOTE — Anesthesia Preprocedure Evaluation (Signed)
Anesthesia Evaluation  Patient identified by MRN, date of birth, ID band Patient awake    Reviewed: Allergy & Precautions, NPO status , Patient's Chart, lab work & pertinent test results  History of Anesthesia Complications Negative for: history of anesthetic complications  Airway Mallampati: III  TM Distance: >3 FB Neck ROM: Full    Dental no notable dental hx.    Pulmonary neg sleep apnea, COPD (wears O2 at night),  oxygen dependent, former smoker,    breath sounds clear to auscultation- rhonchi (-) wheezing      Cardiovascular hypertension, Pt. on medications (-) CAD, (-) Past MI, (-) Cardiac Stents and (-) CABG  Rhythm:Regular Rate:Normal - Systolic murmurs and - Diastolic murmurs    Neuro/Psych neg Seizures negative neurological ROS  negative psych ROS   GI/Hepatic Neg liver ROS, GERD  ,  Endo/Other  diabetes, Oral Hypoglycemic Agents  Renal/GU negative Renal ROS     Musculoskeletal negative musculoskeletal ROS (+)   Abdominal (+) - obese,   Peds  Hematology  (+) anemia ,   Anesthesia Other Findings Past Medical History: No date: Actinic keratoses No date: BPH (benign prostatic hyperplasia) No date: COPD (chronic obstructive pulmonary disease) (HCC) No date: Diabetes mellitus without complication (HCC) No date: GERD (gastroesophageal reflux disease) No date: Hypertension No date: Insomnia No date: Iron deficiency anemia No date: Mixed hyperlipidemia No date: Oxygen deficiency   Reproductive/Obstetrics                             Anesthesia Physical Anesthesia Plan  ASA: III  Anesthesia Plan: General   Post-op Pain Management:    Induction: Intravenous  PONV Risk Score and Plan: 1 and Propofol infusion  Airway Management Planned: Natural Airway  Additional Equipment:   Intra-op Plan:   Post-operative Plan:   Informed Consent: I have reviewed the patients History  and Physical, chart, labs and discussed the procedure including the risks, benefits and alternatives for the proposed anesthesia with the patient or authorized representative who has indicated his/her understanding and acceptance.   Dental advisory given  Plan Discussed with: CRNA and Anesthesiologist  Anesthesia Plan Comments:         Anesthesia Quick Evaluation

## 2018-10-20 ENCOUNTER — Encounter: Payer: Self-pay | Admitting: Gastroenterology

## 2018-11-29 ENCOUNTER — Encounter: Payer: Self-pay | Admitting: Family Medicine

## 2018-11-29 ENCOUNTER — Ambulatory Visit: Payer: Medicare Other | Admitting: Family Medicine

## 2018-11-29 VITALS — BP 132/78 | HR 98 | Temp 97.7°F | Resp 18 | Wt 182.6 lb

## 2018-11-29 DIAGNOSIS — J069 Acute upper respiratory infection, unspecified: Secondary | ICD-10-CM | POA: Diagnosis not present

## 2018-11-29 DIAGNOSIS — J439 Emphysema, unspecified: Secondary | ICD-10-CM

## 2018-11-29 MED ORDER — DOXYCYCLINE HYCLATE 100 MG PO TABS: 100.0000 mg | ORAL_TABLET | Freq: Two times a day (BID) | ORAL | 0 refills | Status: DC

## 2018-11-29 MED ORDER — PREDNISONE 20 MG PO TABS: ORAL_TABLET | ORAL | 1 refills | Status: DC

## 2018-11-29 NOTE — Patient Instructions (Signed)
Continue Mucinex and Albuterol at least twice daily while ill. Start the antibiotic if your cough and breathing not improving with prednisone.

## 2018-11-29 NOTE — Progress Notes (Signed)
  Subjective:     Patient ID: Billy Wise, male   DOB: 03-17-1946, 73 y.o.   MRN: [id number] Chief Complaint  Patient presents with  . COPD    Pt presents in office today for cough and chest congestion x 21 days. Pt has been taking Mucinex with some relief. Pt reports cough is productive with clear sputum, he reports shortness of breath and wheezing. Pt has been using Albuterol treatment bid at home, also usint Symbicort and Spirvia inhaler.   HPI States he developed cold sx 3 weeks ago. Sinuses have improved but now has been coughing for the past week. Reports clear, thin sputum and no fever. Concerned about possibility of  developing pneumonia. He has been compliant with his inhalers and is using albuterol more frequently during this illness.  Review of Systems     Objective:   Physical Exam Constitutional:      General: He is not in acute distress.    Appearance: He is not ill-appearing.  Neurological:     Mental Status: He is alert.   Ears: T.M's intact without inflammation Throat: no tonsillar enlargement or exudate Neck: no cervical adenopathy Lungs: basilar coarse crackles L > R     Assessment:    1. URI, acute - predniSONE (DELTASONE) 20 MG tablet; One pill twice daily for 5 days  Dispense: 10 tablet; Refill: 1 - doxycycline (VIBRA-TABS) 100 MG tablet; Take 1 tablet (100 mg total) by mouth 2 (two) times daily.  Dispense: 14 tablet; Refill: 0  2. Pulmonary emphysema, unspecified emphysema type (HCC) - predniSONE (DELTASONE) 20 MG tablet; One pill twice daily for 5 days  Dispense: 10 tablet; Refill: 1    Plan:    Start prednisone. If cough not improving to start abx. Continue Mucinex and albuterol.

## 2018-12-03 ENCOUNTER — Encounter: Payer: Self-pay | Admitting: Family Medicine

## 2018-12-22 ENCOUNTER — Other Ambulatory Visit: Payer: Self-pay | Admitting: Family Medicine

## 2018-12-22 NOTE — Telephone Encounter (Signed)
Patient advised.

## 2018-12-22 NOTE — Telephone Encounter (Signed)
Patient needs to not get further refills from [facility] and only refills will come from [place] now that we are filling this.

## 2018-12-26 ENCOUNTER — Encounter: Payer: Self-pay | Admitting: Family Medicine

## 2018-12-27 ENCOUNTER — Telehealth: Payer: Self-pay

## 2018-12-27 MED ORDER — SUVOREXANT 10 MG PO TABS: 10.0000 mg | ORAL_TABLET | Freq: Every day | ORAL | 2 refills | Status: DC

## 2018-12-27 NOTE — Telephone Encounter (Signed)
Patient's Zolpidem required a PA. PA was submitted and denied. Patient needs to try Belsomra. Patient's wife Agustin Cree advised.

## 2018-12-27 NOTE — Telephone Encounter (Signed)
rx for belsomra sent to pharmacy

## 2019-01-05 LAB — HM DIABETES EYE EXAM

## 2019-01-15 ENCOUNTER — Encounter: Payer: Self-pay | Admitting: Family Medicine

## 2019-01-16 MED ORDER — ZOLPIDEM TARTRATE 10 MG PO TABS: ORAL_TABLET | ORAL | 1 refills | Status: DC

## 2019-01-23 ENCOUNTER — Telehealth: Payer: Self-pay

## 2019-01-23 MED ORDER — RAMELTEON 8 MG PO TABS: 8.0000 mg | ORAL_TABLET | Freq: Every day | ORAL | 2 refills | Status: DC

## 2019-01-23 NOTE — Telephone Encounter (Signed)
Received a PA for Ambien. Attempted to complete PA, but needed a appeals since medication has been denied previous. Patient tried and failed Belsomra. Contacted [employer] to complete appeals. Representative states patient will have to try and fail Belsomra & Remelteon. Patient advised. He agrees to try Remelteon. He requested RX be sent to [facility] pharmacy.

## 2019-01-23 NOTE — Telephone Encounter (Signed)
Rx sent. Patient can let us know about tolerability and efficacy.

## 2019-02-09 LAB — BASIC METABOLIC PANEL
BUN: 11 (ref 4–21)
Creatinine: 0.8 (ref 0.6–1.3)
Glucose: 178
Potassium: 4.8 (ref 3.4–5.3)
Sodium: 138 (ref 137–147)

## 2019-02-09 LAB — HEMOGLOBIN A1C: Hemoglobin A1C: 6.8

## 2019-02-19 ENCOUNTER — Encounter: Payer: Self-pay | Admitting: Family Medicine

## 2019-02-20 MED ORDER — ZOLPIDEM TARTRATE 10 MG PO TABS: ORAL_TABLET | ORAL | 1 refills | Status: DC

## 2019-02-26 ENCOUNTER — Encounter: Payer: Self-pay | Admitting: Family Medicine

## 2019-04-19 ENCOUNTER — Other Ambulatory Visit: Payer: Self-pay

## 2019-04-19 ENCOUNTER — Ambulatory Visit (INDEPENDENT_AMBULATORY_CARE_PROVIDER_SITE_OTHER): Payer: Medicare Other

## 2019-04-19 ENCOUNTER — Telehealth: Payer: Self-pay

## 2019-04-19 ENCOUNTER — Ambulatory Visit (INDEPENDENT_AMBULATORY_CARE_PROVIDER_SITE_OTHER): Payer: Medicare Other | Admitting: Family Medicine

## 2019-04-19 ENCOUNTER — Encounter: Payer: Self-pay | Admitting: Family Medicine

## 2019-04-19 VITALS — BP 124/60 | HR 94 | Temp 98.0°F | Ht 65.0 in | Wt 183.2 lb

## 2019-04-19 DIAGNOSIS — E782 Mixed hyperlipidemia: Secondary | ICD-10-CM | POA: Diagnosis not present

## 2019-04-19 DIAGNOSIS — K219 Gastro-esophageal reflux disease without esophagitis: Secondary | ICD-10-CM

## 2019-04-19 DIAGNOSIS — N401 Enlarged prostate with lower urinary tract symptoms: Secondary | ICD-10-CM

## 2019-04-19 DIAGNOSIS — E1169 Type 2 diabetes mellitus with other specified complication: Secondary | ICD-10-CM

## 2019-04-19 DIAGNOSIS — Z794 Long term (current) use of insulin: Secondary | ICD-10-CM

## 2019-04-19 DIAGNOSIS — Z862 Personal history of diseases of the blood and blood-forming organs and certain disorders involving the immune mechanism: Secondary | ICD-10-CM

## 2019-04-19 DIAGNOSIS — Z0000 Encounter for general adult medical examination without abnormal findings: Secondary | ICD-10-CM

## 2019-04-19 DIAGNOSIS — I10 Essential (primary) hypertension: Secondary | ICD-10-CM | POA: Diagnosis not present

## 2019-04-19 DIAGNOSIS — J439 Emphysema, unspecified: Secondary | ICD-10-CM

## 2019-04-19 DIAGNOSIS — G4700 Insomnia, unspecified: Secondary | ICD-10-CM

## 2019-04-19 DIAGNOSIS — N138 Other obstructive and reflux uropathy: Secondary | ICD-10-CM

## 2019-04-19 MED ORDER — METFORMIN HCL 1000 MG PO TABS: 1000.0000 mg | ORAL_TABLET | Freq: Two times a day (BID) | ORAL | 3 refills | Status: DC

## 2019-04-19 MED ORDER — OXYBUTYNIN CHLORIDE ER 10 MG PO TB24: 10.0000 mg | ORAL_TABLET | Freq: Every day | ORAL | 3 refills | Status: DC

## 2019-04-19 MED ORDER — GLUCOSE BLOOD VI STRP: ORAL_STRIP | 12 refills | Status: DC

## 2019-04-19 MED ORDER — PEN NEEDLES 31G X 5 MM MISC: 1.0000 | Freq: Every day | 3 refills | Status: DC | PRN

## 2019-04-19 MED ORDER — LEVEMIR FLEXTOUCH 100 UNIT/ML ~~LOC~~ SOPN: 80.0000 [IU] | PEN_INJECTOR | Freq: Every day | SUBCUTANEOUS | 3 refills | Status: DC

## 2019-04-19 MED ORDER — ZOLPIDEM TARTRATE 10 MG PO TABS: ORAL_TABLET | ORAL | 1 refills | Status: DC

## 2019-04-19 MED ORDER — QUINAPRIL HCL 40 MG PO TABS: 40.0000 mg | ORAL_TABLET | Freq: Every day | ORAL | 3 refills | Status: DC

## 2019-04-19 MED ORDER — ONETOUCH DELICA LANCETS 33G MISC: 3 refills | Status: DC

## 2019-04-19 MED ORDER — FINASTERIDE 5 MG PO TABS: 5.0000 mg | ORAL_TABLET | Freq: Every day | ORAL | 3 refills | Status: DC

## 2019-04-19 MED ORDER — OMEPRAZOLE 20 MG PO CPDR: 20.0000 mg | DELAYED_RELEASE_CAPSULE | Freq: Every day | ORAL | 3 refills | Status: DC

## 2019-04-19 MED ORDER — IPRATROPIUM-ALBUTEROL 0.5-2.5 (3) MG/3ML IN SOLN: 3.0000 mL | Freq: Four times a day (QID) | RESPIRATORY_TRACT | 2 refills | Status: DC

## 2019-04-19 MED ORDER — TAMSULOSIN HCL 0.4 MG PO CAPS: 0.4000 mg | ORAL_CAPSULE | Freq: Every day | ORAL | 3 refills | Status: DC

## 2019-04-19 MED ORDER — SPIRIVA HANDIHALER 18 MCG IN CAPS: 18.0000 ug | ORAL_CAPSULE | Freq: Every day | RESPIRATORY_TRACT | 3 refills | Status: DC

## 2019-04-19 MED ORDER — SYMBICORT 80-4.5 MCG/ACT IN AERO: 2.0000 | INHALATION_SPRAY | Freq: Two times a day (BID) | RESPIRATORY_TRACT | 3 refills | Status: DC

## 2019-04-19 MED ORDER — PROAIR HFA 108 (90 BASE) MCG/ACT IN AERS: 2.0000 | INHALATION_SPRAY | RESPIRATORY_TRACT | 5 refills | Status: DC | PRN

## 2019-04-19 MED ORDER — GLIMEPIRIDE 4 MG PO TABS: 4.0000 mg | ORAL_TABLET | Freq: Two times a day (BID) | ORAL | 3 refills | Status: DC

## 2019-04-19 MED ORDER — BYETTA 10 MCG PEN 10 MCG/0.04ML ~~LOC~~ SOPN: 10.0000 ug | PEN_INJECTOR | Freq: Two times a day (BID) | SUBCUTANEOUS | 3 refills | Status: DC

## 2019-04-19 MED ORDER — SIMVASTATIN 20 MG PO TABS: 20.0000 mg | ORAL_TABLET | Freq: Every evening | ORAL | 3 refills | Status: DC

## 2019-04-19 MED ORDER — AMLODIPINE BESYLATE 10 MG PO TABS: 10.0000 mg | ORAL_TABLET | Freq: Every day | ORAL | 3 refills | Status: DC

## 2019-04-19 NOTE — Telephone Encounter (Signed)
Sent to [facility].

## 2019-04-19 NOTE — Patient Instructions (Signed)
 The CDC recommends two doses of Shingrix (the shingles vaccine) separated by 2 to 6 months for adults age 71 years and older. I recommend checking with your insurance plan regarding coverage for this vaccine.    Preventive Care 71 Years and Older, Male Preventive care refers to lifestyle choices and visits with your health care provider that can promote health and wellness. This includes:  A yearly physical exam. This is also called an annual well check.  Regular dental and eye exams.  Immunizations.  Screening for certain conditions.  Healthy lifestyle choices, such as diet and exercise. What can I expect for my preventive care visit? Physical exam Your health care provider will check:  Height and weight. These may be used to calculate body mass index (BMI), which is a measurement that tells if you are at a healthy weight.  Heart rate and blood pressure.  Your skin for abnormal spots. Counseling Your health care provider may ask you questions about:  Alcohol, tobacco, and drug use.  Emotional well-being.  Home and relationship well-being.  Sexual activity.  Eating habits.  History of falls.  Memory and ability to understand (cognition).  Work and work environment. What immunizations do I need?  Influenza (flu) vaccine  This is recommended every year. Tetanus, diphtheria, and pertussis (Tdap) vaccine  You may need a Td booster every 10 years. Varicella (chickenpox) vaccine  You may need this vaccine if you have not already been vaccinated. Zoster (shingles) vaccine  You may need this after age 60. Pneumococcal conjugate (PCV13) vaccine  One dose is recommended after age 65. Pneumococcal polysaccharide (PPSV23) vaccine  One dose is recommended after age 65. Measles, mumps, and rubella (MMR) vaccine  You may need at least one dose of MMR if you were born in 1957 or later. You may also need a second dose. Meningococcal conjugate (MenACWY) vaccine  You  may need this if you have certain conditions. Hepatitis A vaccine  You may need this if you have certain conditions or if you travel or work in places where you may be exposed to hepatitis A. Hepatitis B vaccine  You may need this if you have certain conditions or if you travel or work in places where you may be exposed to hepatitis B. Haemophilus influenzae type b (Hib) vaccine  You may need this if you have certain conditions. You may receive vaccines as individual doses or as more than one vaccine together in one shot (combination vaccines). Talk with your health care provider about the risks and benefits of combination vaccines. What tests do I need? Blood tests  Lipid and cholesterol levels. These may be checked every 5 years, or more frequently depending on your overall health.  Hepatitis C test.  Hepatitis B test. Screening  Lung cancer screening. You may have this screening every year starting at age 55 if you have a 30-pack-year history of smoking and currently smoke or have quit within the past 15 years.  Colorectal cancer screening. All adults should have this screening starting at age 71 and continuing until age 75. Your health care provider may recommend screening at age 45 if you are at increased risk. You will have tests every 1-10 years, depending on your results and the type of screening test.  Prostate cancer screening. Recommendations will vary depending on your family history and other risks.  Diabetes screening. This is done by checking your blood sugar (glucose) after you have not eaten for a while (fasting). You may have   this done every 1-3 years.  Abdominal aortic aneurysm (AAA) screening. You may need this if you are a current or former smoker.  Sexually transmitted disease (STD) testing. Follow these instructions at home: Eating and drinking  Eat a diet that includes fresh fruits and vegetables, whole grains, lean protein, and low-fat dairy products. Limit  your intake of foods with high amounts of sugar, saturated fats, and salt.  Take vitamin and mineral supplements as recommended by your health care provider.  Do not drink alcohol if your health care provider tells you not to drink.  If you drink alcohol: ? Limit how much you have to 0-2 drinks a day. ? Be aware of how much alcohol is in your drink. In the [place] one drink equals one 12 oz bottle of beer (355 mL), one 5 oz glass of wine (148 mL), or one 1 oz glass of hard liquor (44 mL). Lifestyle  Take daily care of your teeth and gums.  Stay active. Exercise for at least 30 minutes on 5 or more days each week.  Do not use any products that contain nicotine or tobacco, such as cigarettes, e-cigarettes, and chewing tobacco. If you need help quitting, ask your health care provider.  If you are sexually active, practice safe sex. Use a condom or other form of protection to prevent STIs (sexually transmitted infections).  Talk with your health care provider about taking a low-dose aspirin or statin. What's next?  Visit your health care provider once a year for a well check visit.  Ask your health care provider how often you should have your eyes and teeth checked.  Stay up to date on all vaccines. This information is not intended to replace advice given to you by your health care provider. Make sure you discuss any questions you have with your health care provider. Document Released: 11/01/2015 Document Revised: 09/29/2018 Document Reviewed: 09/29/2018 Elsevier Patient Education  2020 Elsevier Inc.  

## 2019-04-19 NOTE — Patient Instructions (Signed)
Billy Wise , Thank you for taking time to come for your Medicare Wellness Visit. I appreciate your ongoing commitment to your health goals. Please review the following plan we discussed and let me know if I can assist you in the future.   Screening recommendations/referrals: Colonoscopy: Up to date Recommended yearly ophthalmology/optometry visit for glaucoma screening and checkup Recommended yearly dental visit for hygiene and checkup  Vaccinations: Influenza vaccine: Up to date Pneumococcal vaccine: Completed series Tdap vaccine: Up to date, due 02/2026 Shingles vaccine: Pt declines today.     Advanced directives: Please bring a copy of your POA (Power of Attorney) and/or Living Will to your next appointment.   Conditions/risks identified: Obesity- recommend to eat 3 small meals a day with 2 healthy snacks in between to help decrease portion sizes.   Next appointment: 9:20 AM today with Dr Brita Romp.   Preventive Care 23 Years and Older, Male Preventive care refers to lifestyle choices and visits with your health care provider that can promote health and wellness. What does preventive care include?  A yearly physical exam. This is also called an annual well check.  Dental exams once or twice a year.  Routine eye exams. Ask your health care provider how often you should have your eyes checked.  Personal lifestyle choices, including:  Daily care of your teeth and gums.  Regular physical activity.  Eating a healthy diet.  Avoiding tobacco and drug use.  Limiting alcohol use.  Practicing safe sex.  Taking low doses of aspirin every day.  Taking vitamin and mineral supplements as recommended by your health care provider. What happens during an annual well check? The services and screenings done by your health care provider during your annual well check will depend on your age, overall health, lifestyle risk factors, and family history of disease. Counseling  Your  health care provider may ask you questions about your:  Alcohol use.  Tobacco use.  Drug use.  Emotional well-being.  Home and relationship well-being.  Sexual activity.  Eating habits.  History of falls.  Memory and ability to understand (cognition).  Work and work Statistician. Screening  You may have the following tests or measurements:  Height, weight, and BMI.  Blood pressure.  Lipid and cholesterol levels. These may be checked every 5 years, or more frequently if you are over 85 years old.  Skin check.  Lung cancer screening. You may have this screening every year starting at age 81 if you have a 30-pack-year history of smoking and currently smoke or have quit within the past 15 years.  Fecal occult blood test (FOBT) of the stool. You may have this test every year starting at age 44.  Flexible sigmoidoscopy or colonoscopy. You may have a sigmoidoscopy every 5 years or a colonoscopy every 10 years starting at age 63.  Prostate cancer screening. Recommendations will vary depending on your family history and other risks.  Hepatitis C blood test.  Hepatitis B blood test.  Sexually transmitted disease (STD) testing.  Diabetes screening. This is done by checking your blood sugar (glucose) after you have not eaten for a while (fasting). You may have this done every 1-3 years.  Abdominal aortic aneurysm (AAA) screening. You may need this if you are a current or former smoker.  Osteoporosis. You may be screened starting at age 63 if you are at high risk. Talk with your health care provider about your test results, treatment options, and if necessary, the need for more tests. Vaccines  Your health care provider may recommend certain vaccines, such as:  Influenza vaccine. This is recommended every year.  Tetanus, diphtheria, and acellular pertussis (Tdap, Td) vaccine. You may need a Td booster every 10 years.  Zoster vaccine. You may need this after age 33.   Pneumococcal 13-valent conjugate (PCV13) vaccine. One dose is recommended after age 23.  Pneumococcal polysaccharide (PPSV23) vaccine. One dose is recommended after age 48. Talk to your health care provider about which screenings and vaccines you need and how often you need them. This information is not intended to replace advice given to you by your health care provider. Make sure you discuss any questions you have with your health care provider. Document Released: 11/01/2015 Document Revised: 06/24/2016 Document Reviewed: 08/06/2015 Elsevier Interactive Patient Education  2017 [employer] Prevention in the Home Falls can cause injuries. They can happen to people of all ages. There are many things you can do to make your home safe and to help prevent falls. What can I do on the outside of my home?  Regularly fix the edges of walkways and driveways and fix any cracks.  Remove anything that might make you trip as you walk through a door, such as a raised step or threshold.  Trim any bushes or trees on the path to your home.  Use bright outdoor lighting.  Clear any walking paths of anything that might make someone trip, such as rocks or tools.  Regularly check to see if handrails are loose or broken. Make sure that both sides of any steps have handrails.  Any raised decks and porches should have guardrails on the edges.  Have any leaves, snow, or ice cleared regularly.  Use sand or salt on walking paths during winter.  Clean up any spills in your garage right away. This includes oil or grease spills. What can I do in the bathroom?  Use night lights.  Install grab bars by the toilet and in the tub and shower. Do not use towel bars as grab bars.  Use non-skid mats or decals in the tub or shower.  If you need to sit down in the shower, use a plastic, non-slip stool.  Keep the floor dry. Clean up any water that spills on the floor as soon as it happens.  Remove soap  buildup in the tub or shower regularly.  Attach bath mats securely with double-sided non-slip rug tape.  Do not have throw rugs and other things on the floor that can make you trip. What can I do in the bedroom?  Use night lights.  Make sure that you have a light by your bed that is easy to reach.  Do not use any sheets or blankets that are too big for your bed. They should not hang down onto the floor.  Have a firm chair that has side arms. You can use this for support while you get dressed.  Do not have throw rugs and other things on the floor that can make you trip. What can I do in the kitchen?  Clean up any spills right away.  Avoid walking on wet floors.  Keep items that you use a lot in easy-to-reach places.  If you need to reach something above you, use a strong step stool that has a grab bar.  Keep electrical cords out of the way.  Do not use floor polish or wax that makes floors slippery. If you must use wax, use non-skid floor wax.  Do  not have throw rugs and other things on the floor that can make you trip. What can I do with my stairs?  Do not leave any items on the stairs.  Make sure that there are handrails on both sides of the stairs and use them. Fix handrails that are broken or loose. Make sure that handrails are as long as the stairways.  Check any carpeting to make sure that it is firmly attached to the stairs. Fix any carpet that is loose or worn.  Avoid having throw rugs at the top or bottom of the stairs. If you do have throw rugs, attach them to the floor with carpet tape.  Make sure that you have a light switch at the top of the stairs and the bottom of the stairs. If you do not have them, ask someone to add them for you. What else can I do to help prevent falls?  Wear shoes that:  Do not have high heels.  Have rubber bottoms.  Are comfortable and fit you well.  Are closed at the toe. Do not wear sandals.  If you use a stepladder:  Make  sure that it is fully opened. Do not climb a closed stepladder.  Make sure that both sides of the stepladder are locked into place.  Ask someone to hold it for you, if possible.  Clearly mark and make sure that you can see:  Any grab bars or handrails.  First and last steps.  Where the edge of each step is.  Use tools that help you move around (mobility aids) if they are needed. These include:  Canes.  Walkers.  Scooters.  Crutches.  Turn on the lights when you go into a dark area. Replace any light bulbs as soon as they burn out.  Set up your furniture so you have a clear path. Avoid moving your furniture around.  If any of your floors are uneven, fix them.  If there are any pets around you, be aware of where they are.  Review your medicines with your doctor. Some medicines can make you feel dizzy. This can increase your chance of falling. Ask your doctor what other things that you can do to help prevent falls. This information is not intended to replace advice given to you by your health care provider. Make sure you discuss any questions you have with your health care provider. Document Released: 08/01/2009 Document Revised: 03/12/2016 Document Reviewed: 11/09/2014 Elsevier Interactive Patient Education  2017 [employer].

## 2019-04-19 NOTE — Progress Notes (Signed)
Subjective:   Billy Wise is a 73 y.o. male who presents for an Initial Medicare Annual Wellness Visit.  Review of Systems  N/A  Cardiac Risk Factors include: advanced age (>82men, >47 women);diabetes mellitus;dyslipidemia;hypertension;male gender;obesity (BMI >30kg/m2)    Objective:    Today's Vitals   04/19/19 0844 04/19/19 0849  BP: 124/60   Pulse: 94   Temp: 98 F (36.7 C)   TempSrc: Oral   Weight: 183 lb 3.2 oz (83.1 kg)   Height: 5\' 5"  (1.651 m)   PainSc: 0-No pain 0-No pain   Body mass index is 30.49 kg/m.  Advanced Directives 04/19/2019 10/18/2018 11/30/2017 10/28/2017 03/01/2016  Does Patient Have a Medical Advance Directive? Yes Yes Yes No No  Type of Paramedic of [place];Living will [place];Living will [place];Living will - -  Does patient want to make changes to medical advance directive? - - No - Patient declined - -  Copy of [place] in Chart? No - copy requested - - - -  Would patient like information on creating a medical advance directive? - - - No - Patient declined No - patient declined information    Current Medications (verified) Outpatient Encounter Medications as of 04/19/2019  Medication Sig  . acidophilus (RISAQUAD) CAPS capsule Take 1 capsule by mouth daily.  Marland Kitchen amLODipine (NORVASC) 10 MG tablet Take 10 mg by mouth daily.  Marland Kitchen ascorbic acid (VITAMIN C) 1000 MG tablet Take 1 tablet by mouth daily.  Marland Kitchen aspirin EC 81 MG tablet Take 81 mg by mouth daily.  Marland Kitchen BYETTA 10 MCG PEN 10 MCG/0.04ML SOPN injection Inject 10 mcg into the skin 2 (two) times daily with a meal.   . finasteride (PROSCAR) 5 MG tablet Take 5 mg by mouth daily.  Marland Kitchen glimepiride (AMARYL) 4 MG tablet Take 4 mg by mouth 2 (two) times daily.   Marland Kitchen ipratropium-albuterol (DUONEB) 0.5-2.5 (3) MG/3ML SOLN Take 3 mLs by nebulization every 6 (six) hours.  . IRON PO Take 1,000 mg by mouth daily.  . Lactobacillus  (PROBIOTIC ACIDOPHILUS PO) Take 1 capsule by mouth daily.  Marland Kitchen LEVEMIR FLEXTOUCH 100 UNIT/ML Pen Inject 80 Units into the skin at bedtime.  . metFORMIN (GLUCOPHAGE) 1000 MG tablet Take 1 tablet by mouth 2 (two) times daily.  Marland Kitchen omeprazole (PRILOSEC) 20 MG capsule Take 20 mg by mouth daily.  Marland Kitchen oxybutynin (DITROPAN-XL) 10 MG 24 hr tablet Take 10 mg by mouth daily.  . polycarbophil (FIBERCON) 625 MG tablet Take 625 mg by mouth every evening.  . Potassium 99 MG TABS Take by mouth daily.   Marland Kitchen PROAIR HFA 108 (90 Base) MCG/ACT inhaler Take 2 puffs by mouth every 4 (four) hours as needed for wheezing.   . quinapril (ACCUPRIL) 40 MG tablet Take 40 mg by mouth daily.  . simvastatin (ZOCOR) 20 MG tablet Take 20 mg by mouth every evening.  Marland Kitchen SPIRIVA HANDIHALER 18 MCG inhalation capsule Take 18 mcg by mouth daily.   . SYMBICORT 80-4.5 MCG/ACT inhaler Take 2 puffs by mouth 2 (two) times daily.  . tamsulosin (FLOMAX) 0.4 MG CAPS capsule Take 1 capsule by mouth at bedtime.   . vitamin B-12 (CYANOCOBALAMIN) 1000 MCG tablet Take 1,000 mcg by mouth daily.  Marland Kitchen zolpidem (AMBIEN) 10 MG tablet TAKE 1 TABLET(10 MG) BY MOUTH DAILY AS NEEDED FOR SLEEP   No facility-administered encounter medications on file as of 04/19/2019.     Allergies (verified) Patient has  no known allergies.   History: Past Medical History:  Diagnosis Date  . Actinic keratoses   . BPH (benign prostatic hyperplasia)   . COPD (chronic obstructive pulmonary disease) (HCC)   . Diabetes mellitus without complication (HCC)   . GERD (gastroesophageal reflux disease)   . Hypertension   . Insomnia   . Iron deficiency anemia   . Mixed hyperlipidemia   . Oxygen deficiency    Past Surgical History:  Procedure Laterality Date  . CARDIAC CATHETERIZATION    . CHOLECYSTECTOMY    . COLONOSCOPY WITH PROPOFOL N/A 10/18/2018   Procedure: COLONOSCOPY WITH PROPOFOL;  Surgeon: Midge MiniumWohl, Darren, MD;  [facility];  Service: Endoscopy;  Laterality:  N/A;  . HERNIA REPAIR     Family History  Problem Relation Age of Onset  . Heart attack Sister   . Hypertension Sister   . Gout Father    Social History   Socioeconomic History  . Marital status: Married    Spouse name: Not on file  . Number of children: 2  . Years of education: Not on file  . Highest education level: High school graduate  Occupational History  . Occupation: Civil Service [employer]  Social Needs  . Financial resource strain: Not hard at all  . Food insecurity    Worry: Never true    Inability: Never true  . Transportation needs    Medical: No    Non-medical: No  Tobacco Use  . Smoking status: Former Smoker    Packs/day: 1.50    Years: 50.00    Pack years: 75.00    Types: Cigarettes    Quit date: 09/29/2008    Years since quitting: 10.5  . Smokeless tobacco: Never Used  Substance and Sexual Activity  . Alcohol use: Yes    Alcohol/week: 0.0 - 1.0 standard drinks    Frequency: Never  . Drug use: No  . Sexual activity: Yes    Partners: Female    Birth control/protection: None  Lifestyle  . Physical activity    Days per week: 4 days    Minutes per session: 40 min  . Stress: Not at all  Relationships  . Social Musicianconnections    Talks on phone: Patient refused    Gets together: Patient refused    Attends religious service: Patient refused    Active member of club or organization: Patient refused    Attends meetings of clubs or organizations: Patient refused    Relationship status: Patient refused  Other Topics Concern  . Not on file  Social History Narrative  . Not on file   Tobacco Counseling Counseling given: Not Answered   Clinical Intake:  Pre-visit preparation completed: Yes  Pain : No/denies pain Pain Score: 0-No pain     Nutritional Status: BMI > 30  Obese Nutritional Risks: None Diabetes: Yes  How often do you need to have someone help you when you read instructions, pamphlets, or other written materials from your doctor or  pharmacy?: 1 - Never   Diabetes:  Is the patient diabetic?  Yes type 2 If diabetic, was a CBG obtained today?  No  Did the patient bring in their glucometer from home?  No  How often do you monitor your CBG's? Once a day in AM.   Financial Strains and Diabetes Management:  Are you having any financial strains with the device, your supplies or your medication? No .  Does the patient want to be seen by Chronic Care Management for  management of their diabetes?  No  Would the patient like to be referred to a Nutritionist or for Diabetic Management?  No   Diabetic Exams:  Diabetic Eye Exam: Completed 01/05/19. Repeat yearly.   Diabetic Foot Exam: Completed 09/19/18. Repeat yearly.  Interpreter Needed?: No  Information entered by :: [facility] LLCMmarkoski, LPN  Activities of Daily Living In your present state of health, do you have any difficulty performing the following activities: 04/19/2019 09/29/2018  Hearing? N N  Vision? N N  Comment Wears eye glasses daily. -  Difficulty concentrating or making decisions? N N  Walking or climbing stairs? N Y  Dressing or bathing? N N  Doing errands, shopping? N N  Preparing Food and eating ? N -  Using the Toilet? N -  In the past six months, have you accidently leaked urine? N -  Do you have problems with loss of bowel control? N -  Managing your Medications? N -  Managing your Finances? N -  Housekeeping or managing your Housekeeping? N -  Some recent data might be hidden     Immunizations and Health Maintenance Immunization History  Administered Date(s) Administered  . Influenza, High Dose Seasonal PF 07/24/2015, 07/21/2016, 08/02/2017, 08/06/2017, 07/27/2018  . Influenza-Unspecified 09/14/2012  . Pneumococcal Conjugate-13 08/25/2014  . Pneumococcal Polysaccharide-23 01/26/2016  . Pneumococcal-Unspecified 12/17/2005  . Tdap 12/17/2005, 02/25/2016  . Zoster 11/24/2013   There are no preventive care reminders to display for this patient.   Patient Care Team: Erasmo DownerBacigalupo, Angela M, MD as PCP - General (Family Medicine) Delorise Royalsose, Donald Roderick, MD as Referring Physician (Endocrinology) Francee NodalGandhi, Lauren B A, MD as Referring Physician (Dermatology) Shane Crutchamachandran, Pradeep, MD as Consulting Physician (Pulmonary Disease)  Indicate any recent Medical Services you may have received from other than [facility] providers in the past year (date may be approximate).    Assessment:   This is a routine wellness examination for Billy SpareSteven.  Hearing/Vision screen No exam data present  Dietary issues and exercise activities discussed: Current Exercise Habits: Home exercise routine, Type of exercise: strength training/weights;treadmill;walking;Other - see comments(rides a stationary bike), Time (Minutes): 45, Frequency (Times/Week): 4, Weekly Exercise (Minutes/Week): 180, Intensity: Moderate, Exercise limited by: None identified  Goals    . DIET - REDUCE PORTION SIZE     Recommend to decrease portion sizes by eating 3 small healthy meals and at least 2 healthy snacks per day.      Depression Screen PHQ 2/9 Scores 04/19/2019 09/29/2018 03/11/2018 12/13/2017  PHQ - 2 Score 0 0 0 3  PHQ- 9 Score - 0 1 7    Fall Risk Fall Risk  04/19/2019 09/29/2018 11/30/2017  Falls in the past year? 0 0 No    FALL RISK PREVENTION PERTAINING TO THE HOME:  Any stairs in or around the home? Yes  If so, are there any without handrails? No   Home free of loose throw rugs in walkways, pet beds, electrical cords, etc? Yes  Adequate lighting in your home to reduce risk of falls? Yes   ASSISTIVE DEVICES UTILIZED TO PREVENT FALLS:  Life alert? No  Use of a cane, walker or w/c? No  Grab bars in the bathroom? Yes  Shower chair or bench in shower? Yes  Elevated toilet seat or a handicapped toilet? No    TIMED UP AND GO:  Was the test performed? No .    Cognitive Function:     6CIT Screen 04/19/2019  What Year? 0 points  What month? 0 points  What  time? 0 points   Count back from 20 0 points  Months in reverse 0 points  Repeat phrase 0 points  Total Score 0    Screening Tests Health Maintenance  Topic Date Due  . INFLUENZA VACCINE  05/20/2019  . HEMOGLOBIN A1C  08/05/2019  . FOOT EXAM  09/20/2019  . OPHTHALMOLOGY EXAM  01/05/2020  . TETANUS/TDAP  02/24/2026  . COLONOSCOPY  10/18/2028  . Hepatitis C Screening  Completed  . PNA vac Low Risk Adult  Completed    Qualifies for Shingles Vaccine? Yes  Zostavax completed 11/24/13. Due for Shingrix. Education has been provided regarding the importance of this vaccine. Pt has been advised to call insurance company to determine out of pocket expense. Advised may also receive vaccine at local pharmacy or Health Dept. Verbalized acceptance and understanding.  Tdap: Up to date  Flu Vaccine: Up to date  Pneumococcal Vaccine: Completed series  Cancer Screenings:  Colorectal Screening: Completed 10/18/18. Repeat every 10 years.  Lung Cancer Screening: (Low Dose CT Chest recommended if Age 78-80 years, 30 pack-year currently smoking OR have quit w/in 15years.) does qualify, however had this completed 09/29/18.  Additional Screening:  Hepatitis C Screening: Up to date  Dental Screening: Recommended annual dental exams for proper oral hygiene  Community Resource Referral:  CRR required this visit?  No        Plan:  I have personally reviewed and addressed the Medicare Annual Wellness questionnaire and have noted the following in the patient's chart:  A. Medical and social history B. Use of alcohol, tobacco or illicit drugs  C. Current medications and supplements D. Functional ability and status E.  Nutritional status F.  Physical activity G. Advance directives H. List of other physicians I.  Hospitalizations, surgeries, and ER visits in previous 12 months J.  Vitals K. Screenings such as hearing and vision if needed, cognitive and depression L. Referrals and appointments   In addition,  I have reviewed and discussed with patient certain preventive protocols, quality metrics, and best practice recommendations. A written personalized care plan for preventive services as well as general preventive health recommendations were provided to patient.   Darrick HuntsmanSigned,    Adalay Azucena, LPN   1/6/10967/10/2018  Nurse Health Advisor    Nurse Notes: None.

## 2019-04-19 NOTE — Telephone Encounter (Signed)
Patient stating that he called office back to let Dr. B know that he needs a prescription sent to [facility] on [facility] [facility] for One touch Ultra test strips. KW

## 2019-04-19 NOTE — Progress Notes (Signed)
Patient: Billy Wise, Male    DOB: [birth year], 73 y.o.   MRN: [id number] Visit Date: 04/19/2019  Today's Provider: Shirlee LatchAngela Joanthony Hamza, MD   Chief Complaint  Patient presents with  . Annual Exam   Subjective:  Patient had a AWE with [facility] prior to appointment.    Annual wellness visit Billy DeisSteven J Miyasaki is a 73 y.o. male who presents today for his Subsequent Annual Wellness Visit. He feels well. He reports exercising 4 days per week. He reports he is sleeping well.  He is currently taking Ambien 10 mg nightly as needed for sleep.  He tried 2 other medications and they did not help.  Insurance finally approved Ambien for him to take again.  Patient's diabetes is managed by his endocrinologist with [facility], Dr Okey Dupreose.  He reports no changes to his medications.  Reports that his prostate symptoms are well controlled with his finasteride and Flomax and oxybutynin.  He reports COPD is well controlled.  He is taking his Symbicort, Spiriva controller medications with good compliance.  He uses duo nebs and albuterol as needed, but this is infrequent  Reports that home blood pressure readings are well controlled.  He is taking amlodipine 10 mg daily and quinapril 40 mg daily with good compliance.  He reports low-sodium diet.  He recently built a home gym when he could no longer go to the gym to work out.  He is also taking simvastatin 20 mg daily with good compliance.  He denies any medication side effects.  He denies any chest pain, myalgias, shortness of breath, dyspnea on exertion, headaches, vision changes.  Patient was found to be anemic in the past and was started on an iron supplement.  He is not sure if he is supposed to continue this.  He was not told what why he was iron deficient.  He reports he did not have further work-up from that. -----------------------------------------------------------   Review of Systems  Constitutional: Negative.   HENT: Negative.   Eyes: Negative.    Respiratory: Positive for shortness of breath.   Cardiovascular: Negative.   Gastrointestinal: Negative.   Endocrine: Negative.   Genitourinary: Negative.   Musculoskeletal: Negative.   Skin: Negative.   Allergic/Immunologic: Negative.   Neurological: Negative.   Hematological: Negative.   Psychiatric/Behavioral: Negative.     Social History   Socioeconomic History  . Marital status: Married    Spouse name: Not on file  . Number of children: 2  . Years of education: Not on file  . Highest education level: High school graduate  Occupational History  . Occupation: Civil Service [employer]  Social Needs  . Financial resource strain: Not hard at all  . Food insecurity    Worry: Never true    Inability: Never true  . Transportation needs    Medical: No    Non-medical: No  Tobacco Use  . Smoking status: Former Smoker    Packs/day: 1.50    Years: 50.00    Pack years: 75.00    Types: Cigarettes    Quit date: 09/29/2008    Years since quitting: 10.5  . Smokeless tobacco: Never Used  Substance and Sexual Activity  . Alcohol use: Yes    Alcohol/week: 0.0 - 1.0 standard drinks    Frequency: Never  . Drug use: No  . Sexual activity: Yes    Partners: Female    Birth control/protection: None  Lifestyle  . Physical activity    Days per week: 4 days  Minutes per session: 40 min  . Stress: Not at all  Relationships  . Social Herbalist on phone: Patient refused    Gets together: Patient refused    Attends religious service: Patient refused    Active member of club or organization: Patient refused    Attends meetings of clubs or organizations: Patient refused    Relationship status: Patient refused  . Intimate partner violence    Fear of current or ex partner: Patient refused    Emotionally abused: Patient refused    Physically abused: Patient refused    Forced sexual activity: Patient refused  Other Topics Concern  . Not on file  Social History Narrative   . Not on file    Patient Active Problem List   Diagnosis Date Noted  . Encounter for screening colonoscopy   . Gastroesophageal reflux disease without esophagitis 09/09/2018  . Mixed hyperlipidemia 09/09/2018  . T2DM (type 2 diabetes mellitus) ([place]) 10/28/2017  . COPD (chronic obstructive pulmonary disease) ([facility]) 10/28/2017  . HTN (hypertension) 10/28/2017  . Insomnia 09/03/2016  . BPH with obstruction/lower urinary tract symptoms 12/16/2015    Past Surgical History:  Procedure Laterality Date  . CARDIAC CATHETERIZATION    . CHOLECYSTECTOMY    . COLONOSCOPY WITH PROPOFOL N/A 10/18/2018   Procedure: COLONOSCOPY WITH PROPOFOL;  Surgeon: Lucilla Lame, MD;  Location[facility] ENDOSCOPY;  Service: Endoscopy;  Laterality: N/A;  . HERNIA REPAIR      His family history includes Gout in his father; Heart attack in his sister; Hypertension in his sister.     Previous Medications   ACIDOPHILUS (RISAQUAD) CAPS CAPSULE    Take 1 capsule by mouth daily.   AMLODIPINE (NORVASC) 10 MG TABLET    Take 10 mg by mouth daily.   ASCORBIC ACID (VITAMIN C) 1000 MG TABLET    Take 1 tablet by mouth daily.   ASPIRIN EC 81 MG TABLET    Take 81 mg by mouth daily.   BYETTA 10 MCG PEN 10 MCG/0.04ML SOPN INJECTION    Inject 10 mcg into the skin 2 (two) times daily with a meal.    FINASTERIDE (PROSCAR) 5 MG TABLET    Take 5 mg by mouth daily.   GLIMEPIRIDE (AMARYL) 4 MG TABLET    Take 4 mg by mouth 2 (two) times daily.    IPRATROPIUM-ALBUTEROL (DUONEB) 0.5-2.5 (3) MG/3ML SOLN    Take 3 mLs by nebulization every 6 (six) hours.   IRON PO    Take 1,000 mg by mouth daily.   LACTOBACILLUS (PROBIOTIC ACIDOPHILUS PO)    Take 1 capsule by mouth daily.   LEVEMIR FLEXTOUCH 100 UNIT/ML PEN    Inject 80 Units into the skin at bedtime.   METFORMIN (GLUCOPHAGE) 1000 MG TABLET    Take 1 tablet by mouth 2 (two) times daily.   OMEPRAZOLE (PRILOSEC) 20 MG CAPSULE    Take 20 mg by mouth daily.   OXYBUTYNIN (DITROPAN-XL) 10 MG 24  HR TABLET    Take 10 mg by mouth daily.   POLYCARBOPHIL (FIBERCON) 625 MG TABLET    Take 625 mg by mouth every evening.   POTASSIUM 99 MG TABS    Take by mouth daily.    PROAIR HFA 108 (90 BASE) MCG/ACT INHALER    Take 2 puffs by mouth every 4 (four) hours as needed for wheezing.    QUINAPRIL (ACCUPRIL) 40 MG TABLET    Take 40 mg by mouth daily.   SIMVASTATIN (ZOCOR)  20 MG TABLET    Take 20 mg by mouth every evening.   SPIRIVA HANDIHALER 18 MCG INHALATION CAPSULE    Take 18 mcg by mouth daily.    SYMBICORT 80-4.5 MCG/ACT INHALER    Take 2 puffs by mouth 2 (two) times daily.   TAMSULOSIN (FLOMAX) 0.4 MG CAPS CAPSULE    Take 1 capsule by mouth at bedtime.    VITAMIN B-12 (CYANOCOBALAMIN) 1000 MCG TABLET    Take 1,000 mcg by mouth daily.   ZOLPIDEM (AMBIEN) 10 MG TABLET    TAKE 1 TABLET(10 MG) BY MOUTH DAILY AS NEEDED FOR SLEEP    Patient Care Team: Erasmo Downer, MD as PCP - General (Family Medicine) Delorise Royals, MD as Referring Physician (Endocrinology) Francee Nodal, MD as Referring Physician (Dermatology) Shane Crutch, MD as Consulting Physician (Pulmonary Disease)      Objective:   Vitals: BP 124/60 (BP Location: Right Arm, Patient Position: Sitting, Cuff Size: Normal)   Pulse 94   Temp 98 F (36.7 C) (Oral)   Ht  (1.651 m)   Wt 183 lb 3.2 oz (83.1 kg)   BMI 30.49 kg/m   Physical Exam Vitals signs reviewed.  Constitutional:      General: He is not in acute distress.    Appearance: Normal appearance. He is well-developed. He is not diaphoretic.  HENT:     Head: Normocephalic and atraumatic.     Right Ear: Tympanic membrane, ear canal and external ear normal.     Left Ear: Tympanic membrane, ear canal and external ear normal.  Eyes:     General: No scleral icterus.    Extraocular Movements: Extraocular movements intact.     Conjunctiva/sclera: Conjunctivae normal.     Pupils: Pupils are equal, round, and reactive to light.  Neck:      Musculoskeletal: Neck supple.     Thyroid: No thyromegaly.  Cardiovascular:     Rate and Rhythm: Normal rate and regular rhythm.     Pulses: Normal pulses.     Heart sounds: Normal heart sounds. No murmur.  Pulmonary:     Effort: Pulmonary effort is normal. No respiratory distress.     Breath sounds: Normal breath sounds. No wheezing or rales.  Abdominal:     General: There is no distension.     Palpations: Abdomen is soft.     Tenderness: There is no abdominal tenderness.  Musculoskeletal:        General: No deformity.     Right lower leg: No edema.     Left lower leg: No edema.  Lymphadenopathy:     Cervical: No cervical adenopathy.  Skin:    General: Skin is warm and dry.     Capillary Refill: Capillary refill takes less than 2 seconds.     Findings: No rash.  Neurological:     Mental Status: He is alert and oriented to person, place, and time. Mental status is at baseline.  Psychiatric:        Mood and Affect: Mood normal.        Behavior: Behavior normal.        Thought Content: Thought content normal.     Activities of Daily Living In your present state of health, do you have any difficulty performing the following activities: 04/19/2019 09/29/2018  Hearing? N N  Vision? N N  Comment Wears eye glasses daily. -  Difficulty concentrating or making decisions? N N  Walking or climbing stairs? N  Y  Dressing or bathing? N N  Doing errands, shopping? N N  Preparing Food and eating ? N -  Using the Toilet? N -  In the past six months, have you accidently leaked urine? N -  Do you have problems with loss of bowel control? N -  Managing your Medications? N -  Managing your Finances? N -  Housekeeping or managing your Housekeeping? N -  Some recent data might be hidden    Fall Risk Assessment Fall Risk  04/19/2019 09/29/2018 11/30/2017  Falls in the past year? 0 0 No     Depression Screen PHQ 2/9 Scores 04/19/2019 09/29/2018 03/11/2018 12/13/2017  PHQ - 2 Score 0 0 0 3   PHQ- 9 Score - 0 1 7    Assessment & Plan:     Annual Wellness Visit  Reviewed patient's Family Medical History Reviewed and updated list of patient's medical providers Assessment of cognitive impairment was done Assessed patient's functional ability Established a written schedule for health screening services Health Risk Assessent Completed and Reviewed  Exercise Activities and Dietary recommendations Goals    . DIET - REDUCE PORTION SIZE     Recommend to decrease portion sizes by eating 3 small healthy meals and at least 2 healthy snacks per day.       Immunization History  Administered Date(s) Administered  . Influenza, High Dose Seasonal PF 07/24/2015, 07/21/2016, 08/02/2017, 08/06/2017, 07/27/2018  . Influenza-Unspecified 09/14/2012  . Pneumococcal Conjugate-13 08/25/2014  . Pneumococcal Polysaccharide-23 01/26/2016  . Pneumococcal-Unspecified 12/17/2005  . Tdap 12/17/2005, 02/25/2016  . Zoster 11/24/2013    Health Maintenance  Topic Date Due  . INFLUENZA VACCINE  05/20/2019  . HEMOGLOBIN A1C  08/05/2019  . FOOT EXAM  09/20/2019  . OPHTHALMOLOGY EXAM  01/05/2020  . TETANUS/TDAP  02/24/2026  . COLONOSCOPY  10/18/2028  . Hepatitis C Screening  Completed  . PNA vac Low Risk Adult  Completed     Discussed health benefits of physical activity, and encouraged him to engage in regular exercise appropriate for his age and condition.    ------------------------------------------------------------------------------------------------------------  Problem List Items Addressed This Visit      Cardiovascular and Mediastinum   HTN (hypertension)    Well-controlled Continue current medications Recheck metabolic panel Refill sent today Follow-up in 6 months      Relevant Medications   amLODipine (NORVASC) 10 MG tablet   simvastatin (ZOCOR) 20 MG tablet   quinapril (ACCUPRIL) 40 MG tablet     Respiratory   COPD (chronic obstructive pulmonary disease) (HCC)     Chronic and stable No signs of current exacerbation Lungs clear on exam Continue current medications Continue to follow with pulmonology Return precautions discussed      Relevant Medications   ipratropium-albuterol (DUONEB) 0.5-2.5 (3) MG/3ML SOLN   SPIRIVA HANDIHALER 18 MCG inhalation capsule   PROAIR HFA 108 (90 Base) MCG/ACT inhaler   SYMBICORT 80-4.5 MCG/ACT inhaler     Digestive   Gastroesophageal reflux disease without esophagitis    Chronic and stable Continue omeprazole Discussed dietary and lifestyle changes.      Relevant Medications   omeprazole (PRILOSEC) 20 MG capsule   Other Relevant Orders   CBC w/Diff/Platelet (Completed)     Endocrine   T2DM (type 2 diabetes mellitus) (HCC)    Well-controlled Reviewed last A1c and abstracted into the chart Up-to-date on screenings and vaccinations On ACE inhibitor and statin Followed by endocrinology      Relevant Medications   BYETTA 10 MCG  PEN 10 MCG/0.04ML SOPN injection   glimepiride (AMARYL) 4 MG tablet   simvastatin (ZOCOR) 20 MG tablet   quinapril (ACCUPRIL) 40 MG tablet   metFORMIN (GLUCOPHAGE) 1000 MG tablet   LEVEMIR FLEXTOUCH 100 UNIT/ML Pen   glucose blood test strip     Genitourinary   BPH with obstruction/lower urinary tract symptoms    Well-controlled No longer following with urology Recheck his annual PSA today Continue Flomax and finasteride at current dose Continue oxybutynin for nocturia/urinary urgency      Relevant Medications   finasteride (PROSCAR) 5 MG tablet   tamsulosin (FLOMAX) 0.4 MG CAPS capsule   Other Relevant Orders   PSA Total (Reflex To Free) (Completed)     Other   Insomnia    Chronic and stable Discussed risks of long-term Ambien use Continue Ambien 10 mg nightly      Mixed hyperlipidemia    Reviewed last lipid panel Recheck lipid panel and LFTs today Continue simvastatin at current dose Given diabetes, goal LDL less than 70      Relevant Medications    amLODipine (NORVASC) 10 MG tablet   simvastatin (ZOCOR) 20 MG tablet   quinapril (ACCUPRIL) 40 MG tablet   Other Relevant Orders   Lipid panel (Completed)   Hepatic function panel (Completed)   History of anemia    Unclear history of anemia and no anemia on last CBC We will recheck CBC and iron panel today If he is truly iron deficient, he may need warrant further work-up as there is no source of bleeding that we know of If iron panel is normal, we will stop iron supplement and see if it down trends again      Relevant Orders   CBC w/Diff/Platelet (Completed)   Iron and TIBC (Completed)   Ferritin (Completed)    Other Visit Diagnoses    Encounter for annual physical exam    -  Primary   Relevant Orders   PSA Total (Reflex To Free) (Completed)   CBC w/Diff/Platelet (Completed)   Lipid panel (Completed)   Hepatic function panel (Completed)   Iron and TIBC (Completed)   Ferritin (Completed)       Return in about 6 months (around 10/20/2019) for chronic disease f/u.   The entirety of the information documented in the History of Present Illness, Review of Systems and Physical Exam were personally obtained by me. Portions of this information were initially documented by Presley RaddleNikki Walston, CMA and reviewed by me for thoroughness and accuracy.    Yahira Timberman, Marzella SchleinAngela M, MD MPH [facility]

## 2019-04-20 DIAGNOSIS — Z862 Personal history of diseases of the blood and blood-forming organs and certain disorders involving the immune mechanism: Secondary | ICD-10-CM | POA: Insufficient documentation

## 2019-04-20 LAB — IRON AND TIBC
Iron Saturation: 15 % (ref 15–55)
Iron: 39 ug/dL (ref 38–169)
Total Iron Binding Capacity: 260 ug/dL (ref 250–450)
UIBC: 221 ug/dL (ref 111–343)

## 2019-04-20 LAB — HEPATIC FUNCTION PANEL
ALT: 22 IU/L (ref 0–44)
AST: 26 IU/L (ref 0–40)
Albumin: 4.4 g/dL (ref 3.8–4.8)
Alkaline Phosphatase: 71 IU/L (ref 39–117)
Bilirubin Total: 0.3 mg/dL (ref 0.0–1.2)
Bilirubin, Direct: 0.1 mg/dL (ref 0.00–0.40)
Total Protein: 7.2 g/dL (ref 6.0–8.5)

## 2019-04-20 LAB — CBC WITH DIFFERENTIAL/PLATELET
Basophils Absolute: 0 10*3/uL (ref 0.0–0.2)
Basos: 1 %
EOS (ABSOLUTE): 0.3 10*3/uL (ref 0.0–0.4)
Eos: 4 %
Hematocrit: 41.5 % (ref 37.5–51.0)
Hemoglobin: 14.3 g/dL (ref 13.0–17.7)
Immature Grans (Abs): 0 10*3/uL (ref 0.0–0.1)
Immature Granulocytes: 0 %
Lymphocytes Absolute: 1.4 10*3/uL (ref 0.7–3.1)
Lymphs: 21 %
MCH: 28.3 pg (ref 26.6–33.0)
MCHC: 34.5 g/dL (ref 31.5–35.7)
MCV: 82 fL (ref 79–97)
Monocytes Absolute: 0.5 10*3/uL (ref 0.1–0.9)
Monocytes: 8 %
Neutrophils Absolute: 4.3 10*3/uL (ref 1.4–7.0)
Neutrophils: 66 %
Platelets: 225 10*3/uL (ref 150–450)
RBC: 5.06 x10E6/uL (ref 4.14–5.80)
RDW: 13.3 % (ref 11.6–15.4)
WBC: 6.5 10*3/uL (ref 3.4–10.8)

## 2019-04-20 LAB — LIPID PANEL
Chol/HDL Ratio: 5.9 ratio — ABNORMAL HIGH (ref 0.0–5.0)
Cholesterol, Total: 130 mg/dL (ref 100–199)
HDL: 22 mg/dL — ABNORMAL LOW (ref >39)
LDL Calculated: 67 mg/dL (ref 0–99)
Triglycerides: 207 mg/dL — ABNORMAL HIGH (ref 0–149)
VLDL Cholesterol Cal: 41 mg/dL — ABNORMAL HIGH (ref 5–40)

## 2019-04-20 LAB — FERRITIN: Ferritin: 107 ng/mL (ref 30–400)

## 2019-04-20 LAB — PSA TOTAL (REFLEX TO FREE): Prostate Specific Ag, Serum: 0.4 ng/mL (ref 0.0–4.0)

## 2019-04-20 NOTE — Assessment & Plan Note (Signed)
Well-controlled Reviewed last A1c and abstracted into the chart Up-to-date on screenings and vaccinations On ACE inhibitor and statin Followed by endocrinology

## 2019-04-20 NOTE — Assessment & Plan Note (Signed)
Chronic and stable Discussed risks of long-term Ambien use Continue Ambien 10 mg nightly

## 2019-04-20 NOTE — Assessment & Plan Note (Signed)
Chronic and stable No signs of current exacerbation Lungs clear on exam Continue current medications Continue to follow with pulmonology Return precautions discussed

## 2019-04-20 NOTE — Assessment & Plan Note (Signed)
Well-controlled Continue current medications Recheck metabolic panel Refill sent today Follow-up in 6 months

## 2019-04-20 NOTE — Assessment & Plan Note (Signed)
Well-controlled No longer following with urology Recheck his annual PSA today Continue Flomax and finasteride at current dose Continue oxybutynin for nocturia/urinary urgency

## 2019-04-20 NOTE — Assessment & Plan Note (Signed)
Reviewed last lipid panel Recheck lipid panel and LFTs today Continue simvastatin at current dose Given diabetes, goal LDL less than 70

## 2019-04-20 NOTE — Assessment & Plan Note (Signed)
Unclear history of anemia and no anemia on last CBC We will recheck CBC and iron panel today If he is truly iron deficient, he may need warrant further work-up as there is no source of bleeding that we know of If iron panel is normal, we will stop iron supplement and see if it down trends again

## 2019-04-20 NOTE — Assessment & Plan Note (Signed)
Chronic and stable Continue omeprazole Discussed dietary and lifestyle changes.

## 2019-06-23 NOTE — Progress Notes (Signed)
[facility] Consultation      Assessment and Plan:  COPD with dyspnea on exertion. -Symptoms of COPD/emphysema. -Continue spiriva, symbicort, albuterol.  --Continued dyspnea on exertion, can use albuterol before strenuous activity. - He has completed pulmonary rehab, and found that it was helpful.  I encouraged him to continue to try to be active with home exercise until code restrictions are lifted. --PCV 13, 08/25/2014; PPSV 23, 02/15/2016.  High-dose flu vaccine today 06/27/2019.  ILD. -CT chest 02/01/18, shows severe emphysema with air trapping, bibasilar fibrotic changes, do not appear to be in a UIP pattern.  Likely related to underlying emphysema, no further testing at this time. -We will continue to monitor.   Chronic hypoxic respiratory failure. -Repeat sleep study 11/27/2017 showed no significant sleep apnea.  However he did have a sleep-related hypoxemia, currently on 2 L of oxygen at night. - Continue oxygen at night.    Return in about 1 year (around 06/26/2020).   Date: 06/23/2019  MRN# [id number] Billy Wise 07/29/1946    Billy Wise is a 73 y.o. old Wise seen in consultation for chief complaint of:    Chief Complaint  Patient presents with  . Follow-up    c/o sob with exertion, prod cough with white to green mucus mainly in the morning.     HPI:  Billy Wise is a 73 y.o. Wise with a history of COPD exacerbation in January 2019.  He has severe emphysema with bibasilar fibrotic changes.  He has gone through pulmonary rehab, asked to use Symbicort, Spiriva  Since his last visit he feels that his breathing is about the same. He was going to the gym before covid, he continues to exercise at home 4 days per week. When he exercised less he noticed that he lost some strength. He has started to use oxygen during exercise and noticed that he can do more.  He is using spiriva once daily, symbicort 2 puffs bid and albuterol 3 to 4 days per week.     He has completed pulmonary rehab and found it helpful, he went in continuation but had to stop with covid. He has continues on oxygen on 2L at night.   He was started on CPAP for OSA, but could not get used to the mask. He used a full facial mask.  Repeat sleep study showed no evidence of sleep apnea, and it was stopped.  He is being maintained on oxygen.   **Repeat sleep study 11/27/17>> no sleep apnea.  **CBC 10/28/17>>eosinophils equals 0. **CT chest personally reviewed>> CT hi-res  02/01/18; chest x-ray 10/28/17; there is mild basilar fibrosis, bronchiectatic changes with bronchial thickening, severe apical emphysema. Findings not likely due to UIP.  **PFT tracings personally reviewed from 02/01/18>> FVC is 86% predicted, FEV1 is 67% predicted, there is no improvement with bronchodilator.  Ratio 61%. TLC is 106% predicted, RV to TLC ratio is elevated.  DLCO is reduced at 52%.  Flow volume loop is obstructive.  Forced expiratory time is below 6 seconds. -Overall this test is suggestive of obstructive lung disease, however test is suboptimal due to low forced expiratory time. **Desat walk 11/15/17>> baseline sat on RA at rest was 91% and HR 89. Walked 360 feet at a very brisk pace, was conversational. Sat was 88% and HR 112.  Moderate dyspnea.  Medication:    Current Outpatient Medications:  .  acidophilus (RISAQUAD) CAPS capsule, Take 1 capsule by mouth daily., Disp: , Rfl:  .  amLODipine (NORVASC) 10 MG tablet, Take 1 tablet (10 mg total) by mouth daily., Disp: 90 tablet, Rfl: 3 .  ascorbic acid (VITAMIN C) 1000 MG tablet, Take 1 tablet by mouth daily., Disp: , Rfl:  .  aspirin EC 81 MG tablet, Take 81 mg by mouth daily., Disp: , Rfl:  .  BYETTA 10 MCG PEN 10 MCG/0.04ML SOPN injection, Inject 0.04 mLs (10 mcg total) into the skin 2 (two) times daily with a meal., Disp: 3 pen, Rfl: 3 .  finasteride (PROSCAR) 5 MG tablet, Take 1 tablet (5 mg total) by mouth daily., Disp: 90 tablet, Rfl: 3 .   glimepiride (AMARYL) 4 MG tablet, Take 1 tablet (4 mg total) by mouth 2 (two) times daily., Disp: 180 tablet, Rfl: 3 .  glucose blood test strip, Use as instructed to check blood glucose twice daily, Disp: 100 each, Rfl: 12 .  Insulin Pen Needle (PEN NEEDLES) 31G X 5 MM MISC, 1 each by Does not apply route daily as needed., Disp: 100 each, Rfl: 3 .  ipratropium-albuterol (DUONEB) 0.5-2.5 (3) MG/3ML SOLN, Take 3 mLs by nebulization every 6 (six) hours., Disp: 360 mL, Rfl: 2 .  IRON PO, Take 1,000 mg by mouth daily., Disp: , Rfl:  .  Lactobacillus (PROBIOTIC ACIDOPHILUS PO), Take 1 capsule by mouth daily., Disp: , Rfl:  .  LEVEMIR FLEXTOUCH 100 UNIT/ML Pen, Inject 80 Units into the skin at bedtime., Disp: 20 pen, Rfl: 3 .  metFORMIN (GLUCOPHAGE) 1000 MG tablet, Take 1 tablet (1,000 mg total) by mouth 2 (two) times daily., Disp: 180 tablet, Rfl: 3 .  omeprazole (PRILOSEC) 20 MG capsule, Take 1 capsule (20 mg total) by mouth daily., Disp: 90 capsule, Rfl: 3 .  OneTouch Delica Lancets 33G MISC, Use as directed to test twice daily, Disp: 100 each, Rfl: 3 .  oxybutynin (DITROPAN-XL) 10 MG 24 hr tablet, Take 1 tablet (10 mg total) by mouth daily., Disp: 90 tablet, Rfl: 3 .  polycarbophil (FIBERCON) 625 MG tablet, Take 625 mg by mouth every evening., Disp: , Rfl:  .  Potassium 99 MG TABS, Take by mouth daily. , Disp: , Rfl:  .  PROAIR HFA 108 (90 Base) MCG/ACT inhaler, Inhale 2 puffs into the lungs every 4 (four) hours as needed for wheezing., Disp: 18 g, Rfl: 5 .  quinapril (ACCUPRIL) 40 MG tablet, Take 1 tablet (40 mg total) by mouth daily., Disp: 90 tablet, Rfl: 3 .  simvastatin (ZOCOR) 20 MG tablet, Take 1 tablet (20 mg total) by mouth every evening., Disp: 90 tablet, Rfl: 3 .  SPIRIVA HANDIHALER 18 MCG inhalation capsule, Place 1 capsule (18 mcg total) into inhaler and inhale daily., Disp: 90 capsule, Rfl: 3 .  SYMBICORT 80-4.5 MCG/ACT inhaler, Inhale 2 puffs into the lungs 2 (two) times daily., Disp: 3  Inhaler, Rfl: 3 .  tamsulosin (FLOMAX) 0.4 MG CAPS capsule, Take 1 capsule (0.4 mg total) by mouth at bedtime., Disp: 90 capsule, Rfl: 3 .  vitamin B-12 (CYANOCOBALAMIN) 1000 MCG tablet, Take 1,000 mcg by mouth daily., Disp: , Rfl:  .  zolpidem (AMBIEN) 10 MG tablet, TAKE 1 TABLET(10 MG) BY MOUTH DAILY AS NEEDED FOR SLEEP, Disp: 90 tablet, Rfl: 1   Allergies:  Patient has no known allergies.  Review of Systems:  Constitutional: Feels well. Cardiovascular: Denies chest pain, exertional chest pain.  Pulmonary: Denies hemoptysis, pleuritic chest pain.   The remainder of systems were reviewed and were found to be negative other than what  is documented in the HPI.    Physical Examination:   VS: BP 122/60 (BP Location: Left Arm, Cuff Size: Normal)   Pulse 85   Temp (!) 97.5 F (36.4 C) (Temporal)   Ht 5\' 5"  (1.651 m)   Wt 183 lb 6.4 oz (83.2 kg)   SpO2 95%   BMI 30.52 kg/m   General Appearance: No distress  Neuro:without focal findings, mental status, speech normal, alert and oriented HEENT: PERRLA, EOM intact Pulmonary: No wheezing, No rales  CardiovascularNormal S1,S2.  No m/r/g.  Abdomen: Benign, Soft, non-tender, No masses Renal:  No costovertebral tenderness  GU:  No performed at this time. Endoc: No evident thyromegaly, no signs of acromegaly or Cushing features Skin:   warm, no rashes, no ecchymosis  Extremities: normal, no cyanosis, clubbing.      LABORATORY PANEL:   CBC No results for input(s): WBC, HGB, HCT, PLT in the last 168 hours. ------------------------------------------------------------------------------------------------------------------  Chemistries  No results for input(s): NA, K, CL, CO2, GLUCOSE, BUN, CREATININE, CALCIUM, MG, AST, ALT, ALKPHOS, BILITOT in the last 168 hours.  Invalid input(s): GFRCGP ------------------------------------------------------------------------------------------------------------------  Cardiac Enzymes No results for  input(s): TROPONINI in the last 168 hours. ------------------------------------------------------------  RADIOLOGY:  No results found.     Thank  you for the consultation and for allowing [facility] to assist in the care of your patient. Our recommendations are noted above.  Please contact us if we can be of further service.  Marda Stalker, M.D., F.C.C.P.  Board Certified in Internal Medicine, Pulmonary Medicine, [place], and Sleep Medicine.  [facility] Office Number: [phone]   06/23/2019

## 2019-06-27 ENCOUNTER — Encounter: Payer: Self-pay | Admitting: Internal Medicine

## 2019-06-27 ENCOUNTER — Other Ambulatory Visit: Payer: Self-pay

## 2019-06-27 ENCOUNTER — Ambulatory Visit: Payer: Medicare Other | Admitting: Internal Medicine

## 2019-06-27 VITALS — BP 122/60 | HR 85 | Temp 97.5°F | Ht 65.0 in | Wt 183.4 lb

## 2019-06-27 DIAGNOSIS — IMO0002 Reserved for concepts with insufficient information to code with codable children: Secondary | ICD-10-CM

## 2019-06-27 DIAGNOSIS — J449 Chronic obstructive pulmonary disease, unspecified: Secondary | ICD-10-CM | POA: Diagnosis not present

## 2019-06-27 DIAGNOSIS — J849 Interstitial pulmonary disease, unspecified: Secondary | ICD-10-CM

## 2019-06-27 DIAGNOSIS — G4736 Sleep related hypoventilation in conditions classified elsewhere: Secondary | ICD-10-CM

## 2019-06-27 DIAGNOSIS — Z23 Encounter for immunization: Secondary | ICD-10-CM | POA: Diagnosis not present

## 2019-06-27 NOTE — Patient Instructions (Signed)
Continue using symbicort and spiriva.  Continue oxygen.  Continue exercise, this is the best thing that you can do for your respiratory health.

## 2019-09-16 IMAGING — CR DG CHEST 2V
1 series · 2 of 2 positions shown · non-contrast
Comparison: 03/01/2016

CLINICAL DATA: Cough, shortness of breath and fever.

EXAM:
CHEST  2 VIEW

[Series 1: w chest pa · 0.14mm/px · 2 of 2 slices shown]
[im 1/2]
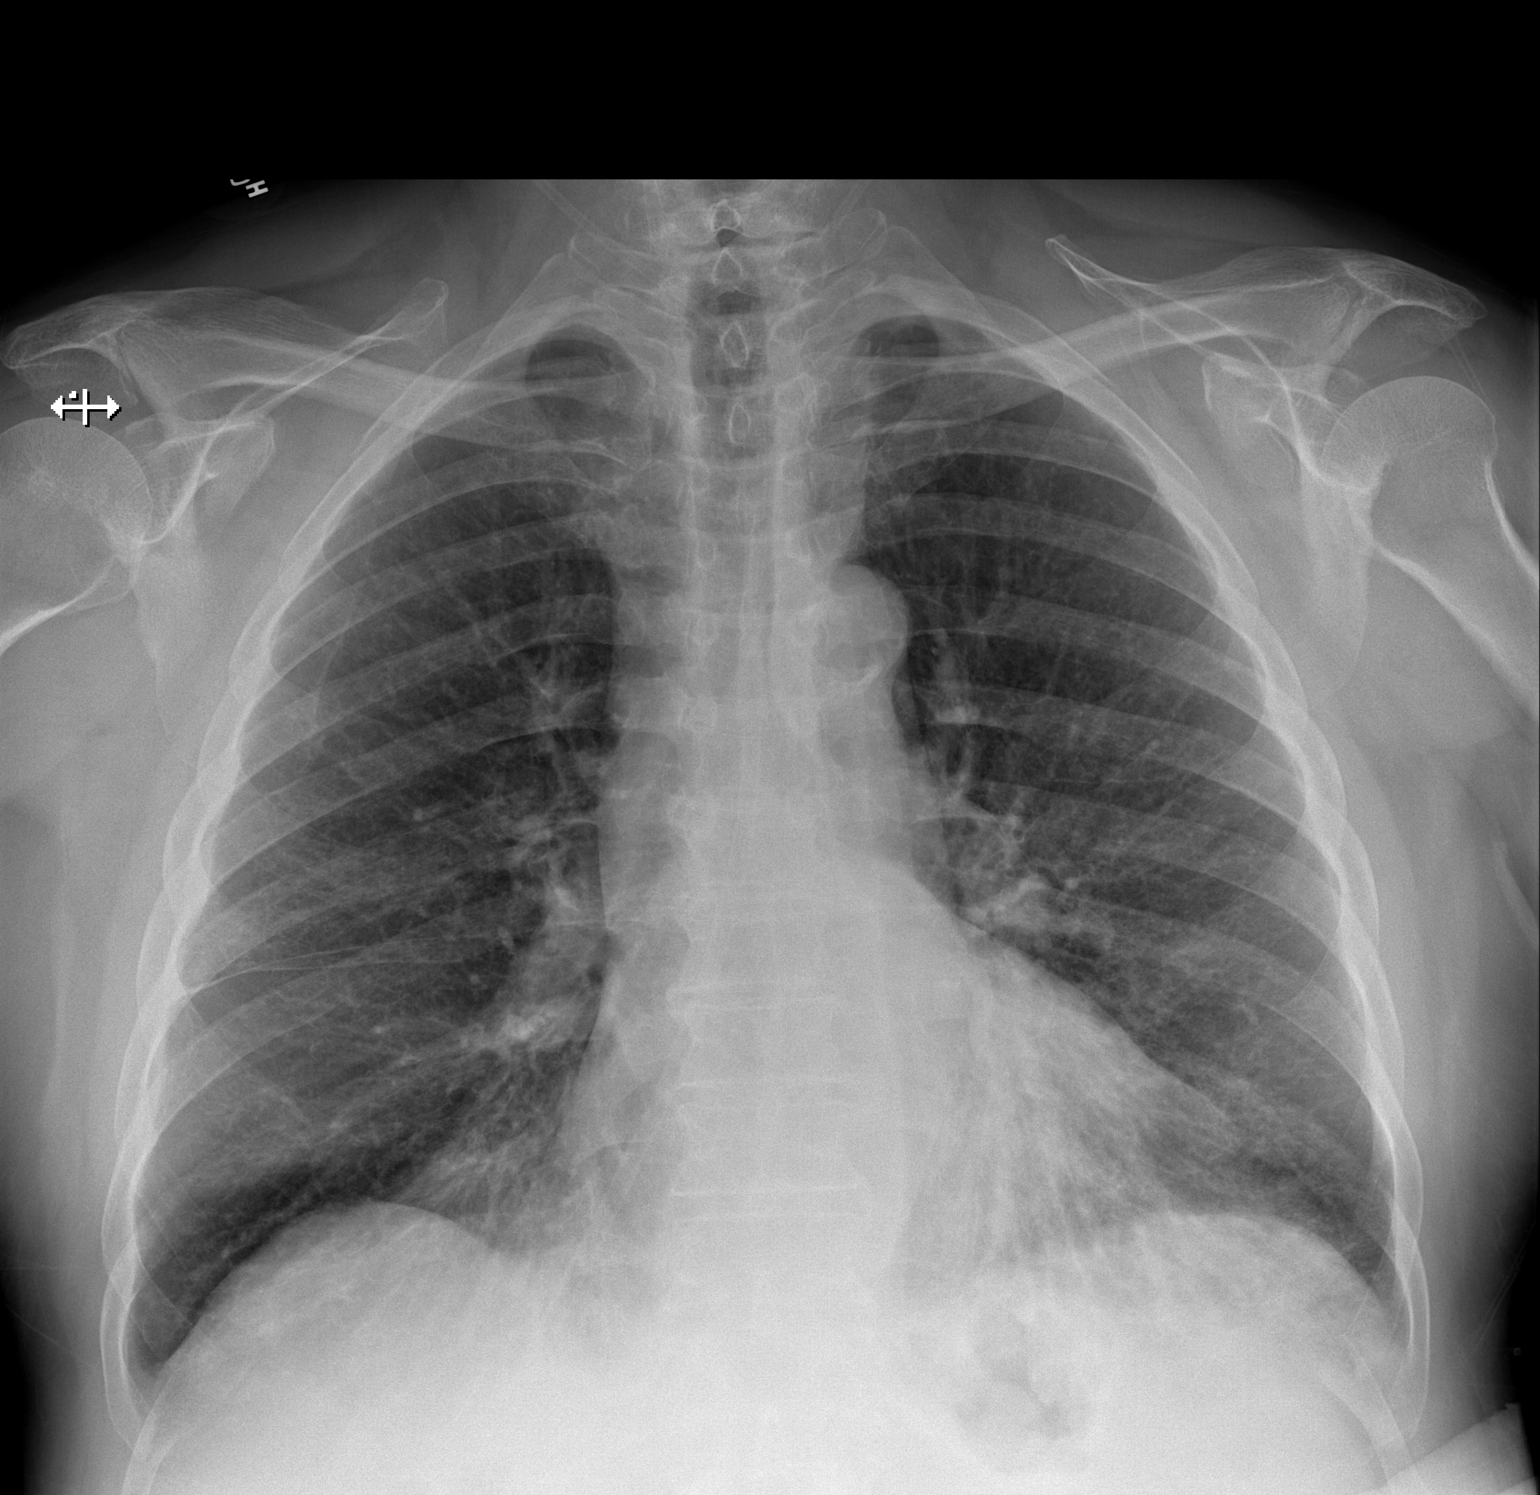
[im 2/2]
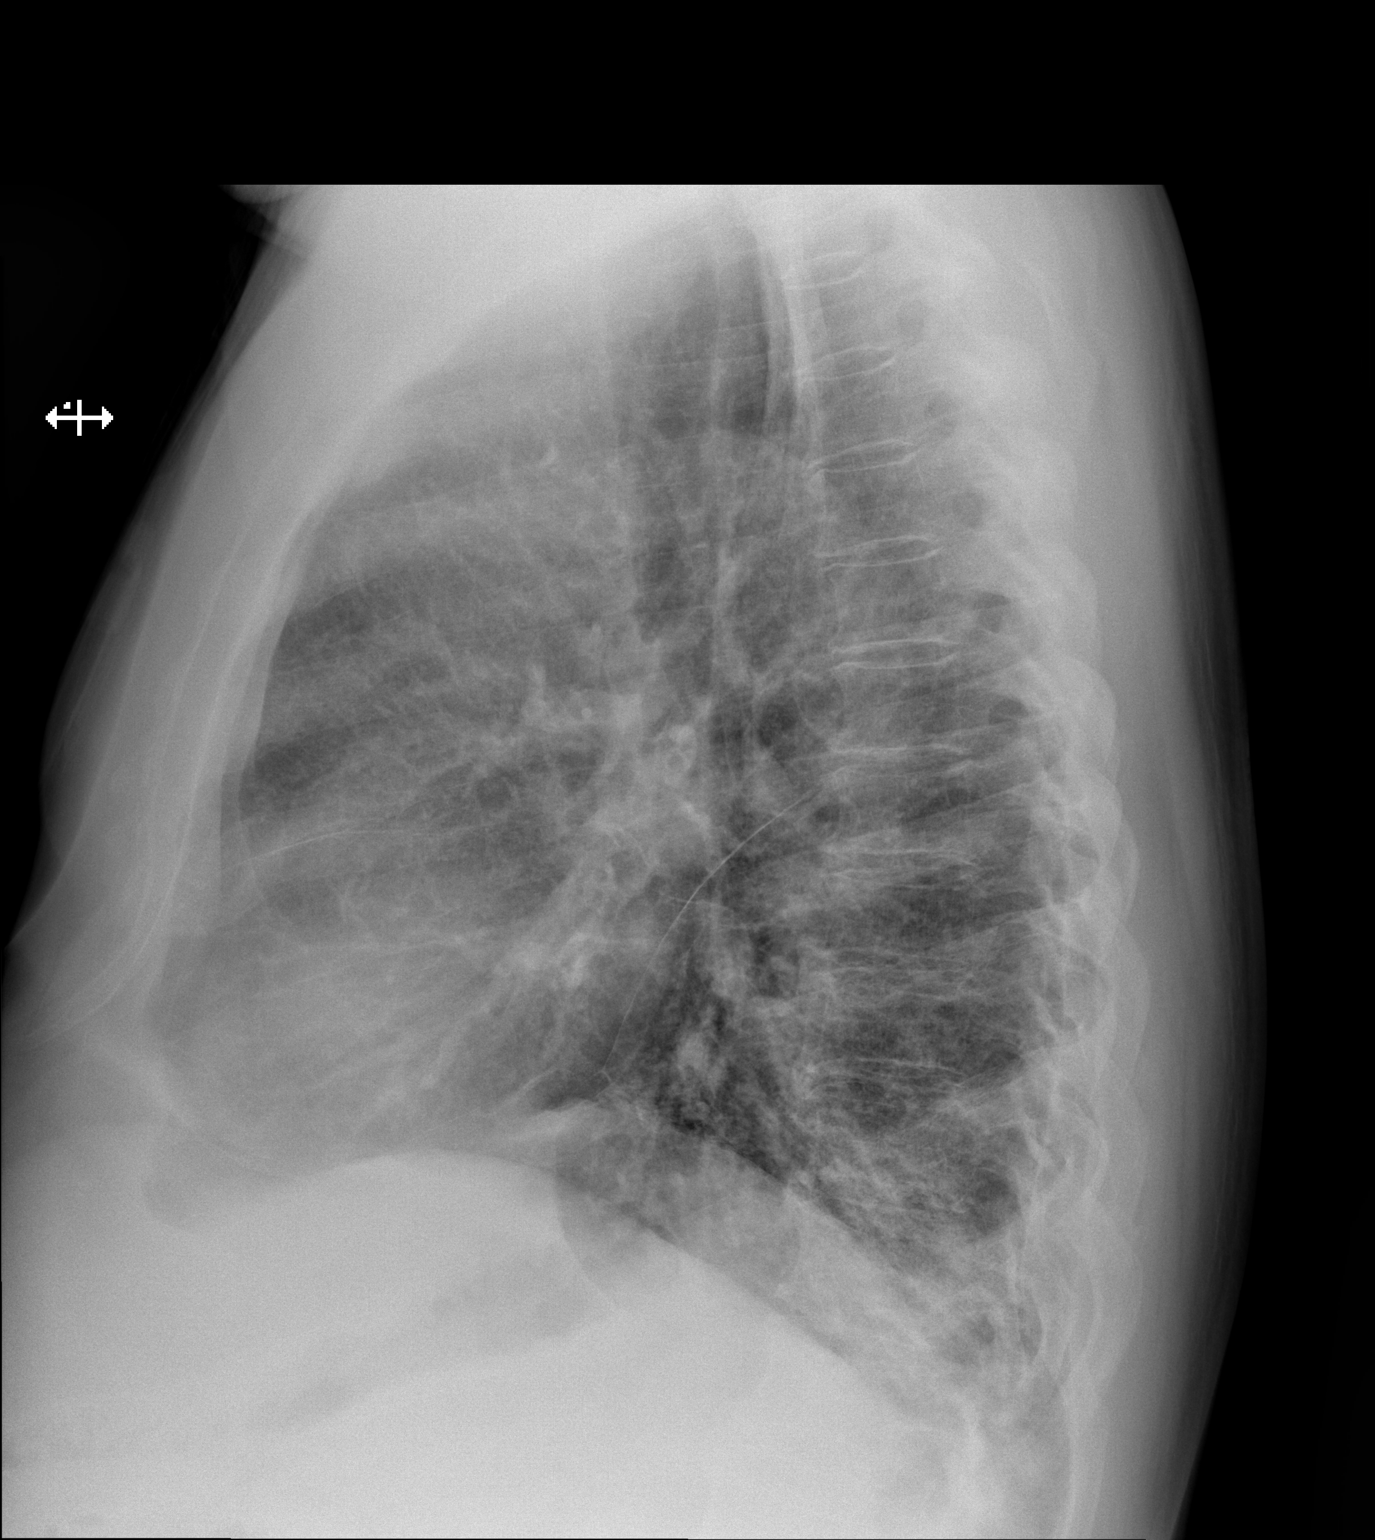

[2 of 2 positions shown; findings below may reference images not displayed]

FINDINGS: The heart size and mediastinal contours are within normal limits.
Bronchial thickening present, especially in the lower lung zones
bilaterally. This may be consistent with acute bronchitis. No edema,
focal airspace consolidation, pneumothorax, nodule or pleural fluid
identified. The visualized skeletal structures are unremarkable.
IMPRESSION: Bilateral lower lung zone bronchial thickening which may be
consistent with acute bronchitis.

## 2019-10-25 ENCOUNTER — Other Ambulatory Visit: Payer: Self-pay

## 2019-10-25 ENCOUNTER — Ambulatory Visit (INDEPENDENT_AMBULATORY_CARE_PROVIDER_SITE_OTHER): Payer: Medicare PPO | Admitting: Family Medicine

## 2019-10-25 ENCOUNTER — Encounter: Payer: Self-pay | Admitting: Family Medicine

## 2019-10-25 VITALS — BP 131/71 | HR 93 | Temp 97.6°F | Resp 16 | Wt 185.0 lb

## 2019-10-25 DIAGNOSIS — K219 Gastro-esophageal reflux disease without esophagitis: Secondary | ICD-10-CM

## 2019-10-25 DIAGNOSIS — I10 Essential (primary) hypertension: Secondary | ICD-10-CM

## 2019-10-25 DIAGNOSIS — J439 Emphysema, unspecified: Secondary | ICD-10-CM

## 2019-10-25 DIAGNOSIS — Z794 Long term (current) use of insulin: Secondary | ICD-10-CM

## 2019-10-25 DIAGNOSIS — E1169 Type 2 diabetes mellitus with other specified complication: Secondary | ICD-10-CM | POA: Diagnosis not present

## 2019-10-25 DIAGNOSIS — E782 Mixed hyperlipidemia: Secondary | ICD-10-CM

## 2019-10-25 DIAGNOSIS — E669 Obesity, unspecified: Secondary | ICD-10-CM

## 2019-10-25 DIAGNOSIS — Z6830 Body mass index (BMI) 30.0-30.9, adult: Secondary | ICD-10-CM

## 2019-10-25 MED ORDER — LEVEMIR FLEXTOUCH 100 UNIT/ML ~~LOC~~ SOPN: 80.0000 [IU] | PEN_INJECTOR | Freq: Every day | SUBCUTANEOUS | 3 refills | Status: DC

## 2019-10-25 NOTE — Assessment & Plan Note (Signed)
Well controlled Continue current medications Recheck metabolic panel F/u in 6 months  

## 2019-10-25 NOTE — Assessment & Plan Note (Signed)
Discussed importance of healthy weight management Discussed diet and exercise  

## 2019-10-25 NOTE — Assessment & Plan Note (Signed)
Chronic and previously well controlled Complicated/associated with HTN, GERD, HLD, obesity Recheck A1c Up-to-date on screenings and vaccinations Foot exam completed today On ACE inhibitor and statin Also followed by endocrinology Refilled his Levemir He will discuss switching from Byetta to Ozempic or Trulicity at his upcoming visit, but we did discuss that either of these would be a good option

## 2019-10-25 NOTE — Assessment & Plan Note (Addendum)
Chronic and stable  No recent exacerbations Clear lung exam today Continue Spiriva and Symbicort Continue to use albuterol as needed

## 2019-10-25 NOTE — Assessment & Plan Note (Signed)
Reviewed last lipid panel Discussed goal LDL less than 70 given diabetes Recheck FLP and CMP today Continue simvastatin at current dose

## 2019-10-25 NOTE — Assessment & Plan Note (Signed)
Chronic and stable Continue omeprazole Reviewed last CBC

## 2019-10-25 NOTE — Progress Notes (Signed)
Patient: Billy Wise Male    DOB: 29-Jun-1946   74 y.o.   MRN: [id number] Visit Date: 10/25/2019  Today's Provider: Shirlee Latch, MD   Chief Complaint  Patient presents with  . Hypertension  . COPD   Subjective:    I Billy Wise, CMA, am acting as scribe for Shirlee Latch, MD.  HPI  Hypertension, follow-up:  BP Readings from Last 3 Encounters:  10/25/19 131/71  06/27/19 122/60  04/19/19 124/60    He was last seen for hypertension 6 months ago.  BP at that visit was 124/60. Management changes since that visit include no changes. He reports excellent compliance with treatment. He is not having side effects.  He is exercising. He is adherent to low salt diet.   Outside blood pressures are stable. He is experiencing none.  Patient denies chest pain.   Cardiovascular risk factors include advanced age (older than 15 for men, 52 for women), diabetes mellitus, hypertension and obesity (BMI >= 30 kg/m2).  Use of agents associated with hypertension: none.     Weight trend: stable Wt Readings from Last 3 Encounters:  10/25/19 185 lb (83.9 kg)  06/27/19 183 lb 6.4 oz (83.2 kg)  04/19/19 183 lb 3.2 oz (83.1 kg)    Current diet: in general, a "healthy" diet    ------------------------------------------------------------------------  Follow up for COPD  The patient was last seen for this 6 months ago. Changes made at last visit include no changes.  He reports excellent compliance with treatment. Patient reports he is having his rescue inhaler 2-3 times a week. He feels that condition is Improved. He is not having side effects.   ------------------------------------------------------------------------------------ Patient reports that his insurance is asking him to change his Byetta. Patient reports that his Levemir is wrote for 45 days only and would like to 90 day supply.  He is seeing Endocrinology later this month, who primarily manages his  diabetes.  His home fasting blood sugars have been good (low 100s).  Denies any hypoglycemia or medication side effects. Reports some burning in the dorsum of his feet occasionally.  Not particularly painful. Assumes it is neuropathy.   No Known Allergies   Current Outpatient Medications:  .  acidophilus (RISAQUAD) CAPS capsule, Take 1 capsule by mouth daily., Disp: , Rfl:  .  amLODipine (NORVASC) 10 MG tablet, Take 1 tablet (10 mg total) by mouth daily., Disp: 90 tablet, Rfl: 3 .  ascorbic acid (VITAMIN C) 1000 MG tablet, Take 1 tablet by mouth daily., Disp: , Rfl:  .  aspirin EC 81 MG tablet, Take 81 mg by mouth daily., Disp: , Rfl:  .  BYETTA 10 MCG PEN 10 MCG/0.04ML SOPN injection, Inject 0.04 mLs (10 mcg total) into the skin 2 (two) times daily with a meal., Disp: 3 pen, Rfl: 3 .  finasteride (PROSCAR) 5 MG tablet, Take 1 tablet (5 mg total) by mouth daily., Disp: 90 tablet, Rfl: 3 .  glimepiride (AMARYL) 4 MG tablet, Take 1 tablet (4 mg total) by mouth 2 (two) times daily., Disp: 180 tablet, Rfl: 3 .  glucose blood test strip, Use as instructed to check blood glucose twice daily, Disp: 100 each, Rfl: 12 .  Insulin Pen Needle (PEN NEEDLES) 31G X 5 MM MISC, 1 each by Does not apply route daily as needed., Disp: 100 each, Rfl: 3 .  ipratropium-albuterol (DUONEB) 0.5-2.5 (3) MG/3ML SOLN, Take 3 mLs by nebulization every 6 (six) hours., Disp: 360 mL,  Rfl: 2 .  LEVEMIR FLEXTOUCH 100 UNIT/ML Pen, Inject 80 Units into the skin at bedtime., Disp: 20 pen, Rfl: 3 .  metFORMIN (GLUCOPHAGE) 1000 MG tablet, Take 1 tablet (1,000 mg total) by mouth 2 (two) times daily., Disp: 180 tablet, Rfl: 3 .  omeprazole (PRILOSEC) 20 MG capsule, Take 1 capsule (20 mg total) by mouth daily., Disp: 90 capsule, Rfl: 3 .  OneTouch Delica Lancets 33G MISC, Use as directed to test twice daily, Disp: 100 each, Rfl: 3 .  oxybutynin (DITROPAN-XL) 10 MG 24 hr tablet, Take 1 tablet (10 mg total) by mouth daily., Disp: 90  tablet, Rfl: 3 .  polycarbophil (FIBERCON) 625 MG tablet, Take 625 mg by mouth every evening., Disp: , Rfl:  .  Potassium 99 MG TABS, Take by mouth daily. , Disp: , Rfl:  .  PROAIR HFA 108 (90 Base) MCG/ACT inhaler, Inhale 2 puffs into the lungs every 4 (four) hours as needed for wheezing., Disp: 18 g, Rfl: 5 .  quinapril (ACCUPRIL) 40 MG tablet, Take 1 tablet (40 mg total) by mouth daily., Disp: 90 tablet, Rfl: 3 .  simvastatin (ZOCOR) 20 MG tablet, Take 1 tablet (20 mg total) by mouth every evening., Disp: 90 tablet, Rfl: 3 .  SPIRIVA HANDIHALER 18 MCG inhalation capsule, Place 1 capsule (18 mcg total) into inhaler and inhale daily., Disp: 90 capsule, Rfl: 3 .  SYMBICORT 80-4.5 MCG/ACT inhaler, Inhale 2 puffs into the lungs 2 (two) times daily., Disp: 3 Inhaler, Rfl: 3 .  tamsulosin (FLOMAX) 0.4 MG CAPS capsule, Take 1 capsule (0.4 mg total) by mouth at bedtime., Disp: 90 capsule, Rfl: 3 .  vitamin B-12 (CYANOCOBALAMIN) 1000 MCG tablet, Take 1,000 mcg by mouth daily., Disp: , Rfl:  .  zolpidem (AMBIEN) 10 MG tablet, TAKE 1 TABLET(10 MG) BY MOUTH DAILY AS NEEDED FOR SLEEP, Disp: 90 tablet, Rfl: 1 .  Lactobacillus (PROBIOTIC ACIDOPHILUS PO), Take 1 capsule by mouth daily., Disp: , Rfl:   Review of Systems  Constitutional: Negative.   Eyes: Negative.   Respiratory: Positive for shortness of breath.   Cardiovascular: Negative.   Endocrine: Negative.     Social History   Tobacco Use  . Smoking status: Former Smoker    Packs/day: 1.50    Years: 50.00    Pack years: 75.00    Types: Cigarettes    Quit date: 09/29/2008    Years since quitting: 11.0  . Smokeless tobacco: Never Used  Substance Use Topics  . Alcohol use: Yes    Alcohol/week: 0.0 - 1.0 standard drinks      Objective:   BP 131/71 (BP Location: Left Arm, Patient Position: Sitting, Cuff Size: Large)   Pulse 93   Temp 97.6 F (36.4 C) (Temporal)   Resp 16   Wt 185 lb (83.9 kg)   SpO2 96%   BMI 30.79 kg/m  Vitals:    10/25/19 0842  BP: 131/71  Pulse: 93  Resp: 16  Temp: 97.6 F (36.4 C)  TempSrc: Temporal  SpO2: 96%  Weight: 185 lb (83.9 kg)  Body mass index is 30.79 kg/m.   Physical Exam Vitals reviewed.  Constitutional:      General: He is not in acute distress.    Appearance: Normal appearance. He is not diaphoretic.  HENT:     Head: Normocephalic and atraumatic.  Eyes:     General: No scleral icterus.    Conjunctiva/sclera: Conjunctivae normal.  Cardiovascular:     Rate and Rhythm: Normal rate  and regular rhythm.     Pulses: Normal pulses.     Heart sounds: Normal heart sounds. No murmur.  Pulmonary:     Effort: Pulmonary effort is normal. No respiratory distress.     Breath sounds: Normal breath sounds. No wheezing or rhonchi.  Abdominal:     General: There is no distension.     Palpations: Abdomen is soft.     Tenderness: There is no abdominal tenderness.  Musculoskeletal:     Cervical back: Neck supple.     Right lower leg: No edema.     Left lower leg: No edema.  Lymphadenopathy:     Cervical: No cervical adenopathy.  Skin:    General: Skin is warm and dry.     Capillary Refill: Capillary refill takes less than 2 seconds.     Findings: No rash.  Neurological:     Mental Status: He is alert and oriented to person, place, and time. Mental status is at baseline.     Cranial Nerves: No cranial nerve deficit.  Psychiatric:        Mood and Affect: Mood normal.        Behavior: Behavior normal.     Diabetic Foot Exam - Simple   Simple Foot Form Diabetic Foot exam was performed with the following findings: Yes 10/25/2019  9:09 AM  Visual Inspection No deformities, no ulcerations, no other skin breakdown bilaterally: Yes Sensation Testing Intact to touch and monofilament testing bilaterally: Yes Pulse Check Posterior Tibialis and Dorsalis pulse intact bilaterally: Yes Comments      No results found for any visits on 10/25/19.     Assessment & Plan    Problem  List Items Addressed This Visit      Cardiovascular and Mediastinum   HTN (hypertension) - Primary    Well controlled Continue current medications Recheck metabolic panel F/u in 6 months       Relevant Orders   CMP (Comprehensive metabolic panel)     Respiratory   COPD (chronic obstructive pulmonary disease) (HCC)    Chronic and stable  No recent exacerbations Clear lung exam today Continue Spiriva and Symbicort Continue to use albuterol as needed        Digestive   Gastroesophageal reflux disease without esophagitis    Chronic and stable Continue omeprazole Reviewed last CBC        Endocrine   T2DM (type 2 diabetes mellitus) (HCC)    Chronic and previously well controlled Complicated/associated with HTN, GERD, HLD, obesity Recheck A1c Up-to-date on screenings and vaccinations Foot exam completed today On ACE inhibitor and statin Also followed by endocrinology Refilled his Levemir He will discuss switching from Byetta to Ozempic or Trulicity at his upcoming visit, but we did discuss that either of these would be a good option      Relevant Medications   LEVEMIR FLEXTOUCH 100 UNIT/ML Pen   Other Relevant Orders   Hemoglobin A1c     Other   Mixed hyperlipidemia    Reviewed last lipid panel Discussed goal LDL less than 70 given diabetes Recheck FLP and CMP today Continue simvastatin at current dose      Relevant Orders   CMP (Comprehensive metabolic panel)   Lipid panel   Obesity    Discussed importance of healthy weight management Discussed diet and exercise      Relevant Medications   LEVEMIR FLEXTOUCH 100 UNIT/ML Pen       Return in about 6 months (around 04/23/2020) for CPE/AWV,  as scheduled.   The entirety of the information documented in the History of Present Illness, Review of Systems and Physical Exam were personally obtained by me. Portions of this information were initially documented by Lynford Humphrey, CMA and reviewed by me for  thoroughness and accuracy.    Brunilda Eble, Dionne Bucy, MD MPH [facility]

## 2019-10-26 LAB — COMPREHENSIVE METABOLIC PANEL
ALT: 34 IU/L (ref 0–44)
AST: 45 IU/L — ABNORMAL HIGH (ref 0–40)
Albumin/Globulin Ratio: 1.4 (ref 1.2–2.2)
Albumin: 4.5 g/dL (ref 3.7–4.7)
Alkaline Phosphatase: 76 IU/L (ref 39–117)
BUN/Creatinine Ratio: 14 (ref 10–24)
BUN: 12 mg/dL (ref 8–27)
Bilirubin Total: 0.2 mg/dL (ref 0.0–1.2)
CO2: 21 mmol/L (ref 20–29)
Calcium: 9.5 mg/dL (ref 8.6–10.2)
Chloride: 98 mmol/L (ref 96–106)
Creatinine, Ser: 0.84 mg/dL (ref 0.76–1.27)
GFR calc Af Amer: 102 mL/min/{1.73_m2} (ref >59)
GFR calc non Af Amer: 88 mL/min/{1.73_m2} (ref >59)
Globulin, Total: 3.3 g/dL (ref 1.5–4.5)
Glucose: 87 mg/dL (ref 65–99)
Potassium: 4.4 mmol/L (ref 3.5–5.2)
Sodium: 138 mmol/L (ref 134–144)
Total Protein: 7.8 g/dL (ref 6.0–8.5)

## 2019-10-26 LAB — LIPID PANEL
Chol/HDL Ratio: 5.5 ratio — ABNORMAL HIGH (ref 0.0–5.0)
Cholesterol, Total: 142 mg/dL (ref 100–199)
HDL: 26 mg/dL — ABNORMAL LOW (ref >39)
LDL Chol Calc (NIH): 77 mg/dL (ref 0–99)
Triglycerides: 232 mg/dL — ABNORMAL HIGH (ref 0–149)
VLDL Cholesterol Cal: 39 mg/dL (ref 5–40)

## 2019-10-26 LAB — HEMOGLOBIN A1C
Est. average glucose Bld gHb Est-mCnc: 146 mg/dL
Hgb A1c MFr Bld: 6.7 % — ABNORMAL HIGH (ref 4.8–5.6)

## 2019-11-14 ENCOUNTER — Other Ambulatory Visit: Payer: Self-pay | Admitting: Family Medicine

## 2019-11-14 NOTE — Telephone Encounter (Signed)
[facility] faxed refill request for the following medications:  zolpidem (AMBIEN) 10 MG tablet   Last Rx: 04/19/2019 90 day supply with 1 refill LOV: 10/25/2019 NOV: 04/29/2020 Please advise. Thanks TNP

## 2019-11-15 MED ORDER — ZOLPIDEM TARTRATE 10 MG PO TABS: ORAL_TABLET | ORAL | 1 refills | Status: DC

## 2020-01-02 DIAGNOSIS — J441 Chronic obstructive pulmonary disease with (acute) exacerbation: Secondary | ICD-10-CM | POA: Diagnosis not present

## 2020-01-08 DIAGNOSIS — E119 Type 2 diabetes mellitus without complications: Secondary | ICD-10-CM | POA: Diagnosis not present

## 2020-01-08 LAB — HM DIABETES EYE EXAM

## 2020-01-09 ENCOUNTER — Encounter: Payer: Self-pay | Admitting: Family Medicine

## 2020-01-23 DIAGNOSIS — E1165 Type 2 diabetes mellitus with hyperglycemia: Secondary | ICD-10-CM | POA: Diagnosis not present

## 2020-02-02 DIAGNOSIS — J441 Chronic obstructive pulmonary disease with (acute) exacerbation: Secondary | ICD-10-CM | POA: Diagnosis not present

## 2020-02-05 ENCOUNTER — Other Ambulatory Visit: Payer: Self-pay | Admitting: Family Medicine

## 2020-02-05 NOTE — Telephone Encounter (Signed)
Medication Refill - Medication: Aucex avia plus meter and strips  He needs a new script sent  Has the patient contacted their pharmacy? No. (Agent: If no, request that the patient contact the pharmacy for the refill.) (Agent: If yes, when and what did the pharmacy advise?)  Preferred Pharmacy (with phone number or street name): [facility] at [address]  Agent: Please be advised that RX refills may take up to 3 business days. We ask that you follow-up with your pharmacy.

## 2020-02-07 MED ORDER — ACCU-CHEK AVIVA PLUS VI STRP: ORAL_STRIP | 1 refills | Status: DC

## 2020-02-07 MED ORDER — ACCU-CHEK AVIVA PLUS W/DEVICE KIT: PACK | 0 refills | Status: AC

## 2020-02-07 MED ORDER — ACCU-CHEK SOFTCLIX LANCETS MISC: 1 refills | Status: DC

## 2020-02-21 DIAGNOSIS — E1165 Type 2 diabetes mellitus with hyperglycemia: Secondary | ICD-10-CM | POA: Diagnosis not present

## 2020-03-03 DIAGNOSIS — J441 Chronic obstructive pulmonary disease with (acute) exacerbation: Secondary | ICD-10-CM | POA: Diagnosis not present

## 2020-03-28 ENCOUNTER — Telehealth (INDEPENDENT_AMBULATORY_CARE_PROVIDER_SITE_OTHER): Payer: Medicare PPO | Admitting: Physician Assistant

## 2020-03-28 DIAGNOSIS — J441 Chronic obstructive pulmonary disease with (acute) exacerbation: Secondary | ICD-10-CM

## 2020-03-28 MED ORDER — PREDNISONE 20 MG PO TABS: 20.0000 mg | ORAL_TABLET | Freq: Every day | ORAL | 0 refills | Status: AC

## 2020-03-28 MED ORDER — DOXYCYCLINE HYCLATE 100 MG PO TABS: 100.0000 mg | ORAL_TABLET | Freq: Two times a day (BID) | ORAL | 0 refills | Status: AC

## 2020-03-28 NOTE — Progress Notes (Signed)
  MyChart Video Visit    Virtual Visit via Video Note   This visit type was conducted due to national recommendations for restrictions regarding the COVID-19 Pandemic (e.g. social distancing) in an effort to limit this patient's exposure and mitigate transmission in our community. This patient is at least at moderate risk for complications without adequate follow up. This format is felt to be most appropriate for this patient at this time. Physical exam was limited by quality of the video and audio technology used for the visit.   Patient location: Home Provider location: Office    Patient: Billy Wise   DOB: 07/02/1946   74 y.o. Male  MRN: [id number] Visit Date: 03/28/2020  Today's healthcare provider: Adriana M Pollak, PA-C   Chief Complaint  Patient presents with  . Cough  I,Adriana M Pollak,acting as a scribe for Adriana M Pollak, PA-C.,have documented all relevant documentation on the behalf of Adriana M Pollak, PA-C,as directed by  Adriana M Pollak, PA-C while in the presence of Adriana M Pollak, PA-C.  Subjective    Cough This is a new problem. The current episode started in the past 7 days. The problem has been gradually worsening. The cough is productive of sputum. Associated symptoms include headaches, nasal congestion, postnasal drip, rhinorrhea, shortness of breath and wheezing. Pertinent negatives include no sore throat. The symptoms are aggravated by exercise. He has tried OTC cough suppressant for the symptoms. The treatment provided mild relief. His past medical history is significant for COPD and pneumonia.  Patient reports he has had pneumonia in the past and does not want this to become pneumonia again. He reports he has to be on  2 units of oxygen at night. No SOB more so than normal. Cough bringing up white mucous. Has been vaccinated for COVID.      Medications: Outpatient Medications Prior to Visit  Medication Sig  . Accu-Chek Softclix Lancets lancets  Use to check blood sugar twice a day. DX E11.9  . acidophilus (RISAQUAD) CAPS capsule Take 1 capsule by mouth daily.  . amLODipine (NORVASC) 10 MG tablet Take 1 tablet (10 mg total) by mouth daily.  . ascorbic acid (VITAMIN C) 1000 MG tablet Take 1 tablet by mouth daily.  . aspirin EC 81 MG tablet Take 81 mg by mouth daily.  . Blood Glucose Monitoring Suppl (ACCU-CHEK AVIVA PLUS) w/Device KIT Use to check blood sugar twice a day.  DX E11.9  . BYETTA 10 MCG PEN 10 MCG/0.04ML SOPN injection Inject 0.04 mLs (10 mcg total) into the skin 2 (two) times daily with a meal.  . finasteride (PROSCAR) 5 MG tablet Take 1 tablet (5 mg total) by mouth daily.  . glimepiride (AMARYL) 4 MG tablet Take 1 tablet (4 mg total) by mouth 2 (two) times daily.  . glucose blood (ACCU-CHEK AVIVA PLUS) test strip Use to check blood sugar twice a day. DX: E11.9  . Insulin Pen Needle (PEN NEEDLES) 31G X 5 MM MISC 1 each by Does not apply route daily as needed.  . ipratropium-albuterol (DUONEB) 0.5-2.5 (3) MG/3ML SOLN Take 3 mLs by nebulization every 6 (six) hours.  . Lactobacillus (PROBIOTIC ACIDOPHILUS PO) Take 1 capsule by mouth daily.  . LEVEMIR FLEXTOUCH 100 UNIT/ML Pen Inject 80 Units into the skin at bedtime.  . metFORMIN (GLUCOPHAGE) 1000 MG tablet Take 1 tablet (1,000 mg total) by mouth 2 (two) times daily.  . omeprazole (PRILOSEC) 20 MG capsule Take 1 capsule (20 mg total) by mouth   daily.  . oxybutynin (DITROPAN-XL) 10 MG 24 hr tablet Take 1 tablet (10 mg total) by mouth daily.  . polycarbophil (FIBERCON) 625 MG tablet Take 625 mg by mouth every evening.  . Potassium 99 MG TABS Take by mouth daily.   . PROAIR HFA 108 (90 Base) MCG/ACT inhaler Inhale 2 puffs into the lungs every 4 (four) hours as needed for wheezing.  . quinapril (ACCUPRIL) 40 MG tablet Take 1 tablet (40 mg total) by mouth daily.  . simvastatin (ZOCOR) 20 MG tablet Take 1 tablet (20 mg total) by mouth every evening.  . SPIRIVA HANDIHALER 18 MCG  inhalation capsule Place 1 capsule (18 mcg total) into inhaler and inhale daily.  . SYMBICORT 80-4.5 MCG/ACT inhaler Inhale 2 puffs into the lungs 2 (two) times daily.  . tamsulosin (FLOMAX) 0.4 MG CAPS capsule Take 1 capsule (0.4 mg total) by mouth at bedtime.  . vitamin B-12 (CYANOCOBALAMIN) 1000 MCG tablet Take 1,000 mcg by mouth daily.  . zolpidem (AMBIEN) 10 MG tablet TAKE 1 TABLET(10 MG) BY MOUTH DAILY AS NEEDED FOR SLEEP   No facility-administered medications prior to visit.    Review of Systems  Constitutional: Negative.   HENT: Positive for congestion, postnasal drip, rhinorrhea and sinus pressure. Negative for sore throat.   Respiratory: Positive for cough, shortness of breath and wheezing.   Neurological: Positive for headaches.      Objective    There were no vitals taken for this visit.   Physical Exam Constitutional:      Appearance: Normal appearance. He is not ill-appearing.  Pulmonary:     Effort: Pulmonary effort is normal. No respiratory distress.  Neurological:     Mental Status: He is alert and oriented to person, place, and time. Mental status is at baseline.  Psychiatric:        Mood and Affect: Mood normal.        Behavior: Behavior normal.        Assessment & Plan    1. COPD exacerbation (HCC)  - doxycycline (VIBRA-TABS) 100 MG tablet; Take 1 tablet (100 mg total) by mouth 2 (two) times daily for 7 days.  Dispense: 14 tablet; Refill: 0 - predniSONE (DELTASONE) 20 MG tablet; Take 1 tablet (20 mg total) by mouth daily with breakfast for 5 days.  Dispense: 5 tablet; Refill: 0    No follow-ups on file.     I discussed the assessment and treatment plan with the patient. The patient was provided an opportunity to ask questions and all were answered. The patient agreed with the plan and demonstrated an understanding of the instructions.   The patient was advised to call back or seek an in-person evaluation if the symptoms worsen or if the condition  fails to improve as anticipated.  I, Adriana M Pollak, PA-C, have reviewed all documentation for this visit. The documentation on 03/28/20 for the exam, diagnosis, procedures, and orders are all accurate and complete.   Adriana M Pollak, PA-C [facility] [phone] (phone) [phone] (fax)  [facility]   

## 2020-03-28 NOTE — Patient Instructions (Signed)

## 2020-04-03 DIAGNOSIS — J441 Chronic obstructive pulmonary disease with (acute) exacerbation: Secondary | ICD-10-CM | POA: Diagnosis not present

## 2020-04-15 DIAGNOSIS — Z794 Long term (current) use of insulin: Secondary | ICD-10-CM | POA: Diagnosis not present

## 2020-04-15 DIAGNOSIS — Z8249 Family history of ischemic heart disease and other diseases of the circulatory system: Secondary | ICD-10-CM | POA: Diagnosis not present

## 2020-04-15 DIAGNOSIS — E1121 Type 2 diabetes mellitus with diabetic nephropathy: Secondary | ICD-10-CM | POA: Diagnosis not present

## 2020-04-15 DIAGNOSIS — Z8669 Personal history of other diseases of the nervous system and sense organs: Secondary | ICD-10-CM | POA: Diagnosis not present

## 2020-04-15 DIAGNOSIS — E785 Hyperlipidemia, unspecified: Secondary | ICD-10-CM | POA: Diagnosis not present

## 2020-04-15 DIAGNOSIS — I10 Essential (primary) hypertension: Secondary | ICD-10-CM | POA: Diagnosis not present

## 2020-04-15 DIAGNOSIS — E65 Localized adiposity: Secondary | ICD-10-CM | POA: Diagnosis not present

## 2020-04-23 ENCOUNTER — Other Ambulatory Visit: Payer: Self-pay | Admitting: Family Medicine

## 2020-04-23 MED ORDER — GLIMEPIRIDE 4 MG PO TABS: 4.0000 mg | ORAL_TABLET | Freq: Two times a day (BID) | ORAL | 0 refills | Status: DC

## 2020-04-23 NOTE — Telephone Encounter (Signed)
Please review. Ok to refill?  

## 2020-04-23 NOTE — Telephone Encounter (Signed)
[facility] faxed refill request for the following medications:  glimepiride (AMARYL) 4 MG tablet 90 day supply  Last Rx: 04/19/2019 LOV: 03/28/2020 w/Adriana Please advise. Thanks TNP

## 2020-04-25 NOTE — Progress Notes (Signed)
Subjective:   Billy Wise is a 74 y.o. male who presents for Medicare Annual/Subsequent preventive examination.  Review of Systems    N/A  Cardiac Risk Factors include: advanced age (>45mn, >>62women);hypertension;diabetes mellitus;male gender;dyslipidemia     Objective:    There were no vitals filed for this visit. There is no height or weight on file to calculate BMI.  Advanced Directives 05/06/2020 04/19/2019 10/18/2018 11/30/2017 10/28/2017 03/01/2016  Does Patient Have a Medical Advance Directive? Yes Yes Yes Yes No No  Type of AParamedicof AScip[place]Living will [place]ashmeetLiving will [place]Living will HSewardLiving will - -  Does patient want to make changes to medical advance directive? - - - No - Patient declined - -  Copy of [place] Chart? No - copy requested No - copy requested - - - -  Would patient like information on creating a medical advance directive? - - - - No - Patient declined No - patient declined information    Current Medications (verified) Outpatient Encounter Medications as of 05/06/2020  Medication Sig  . Accu-Chek Softclix Lancets lancets Use to check blood sugar twice a day. DX E11.9  . acidophilus (RISAQUAD) CAPS capsule Take 1 capsule by mouth daily.  .Marland KitchenamLODipine (NORVASC) 10 MG tablet Take 1 tablet (10 mg total) by mouth daily.  .Marland Kitchenascorbic acid (VITAMIN C) 1000 MG tablet Take 1 tablet by mouth daily.  .Marland Kitchenaspirin EC 81 MG tablet Take 81 mg by mouth daily.  . Blood Glucose Monitoring Suppl (ACCU-CHEK AVIVA PLUS) w/Device KIT Use to check blood sugar twice a day.  DX E11.9  . Dulaglutide (TRULICITY) 1.5 MNA/3.5TDSOPN Inject 1.5 mg into the skin once a week.   . finasteride (PROSCAR) 5 MG tablet Take 1 tablet (5 mg total) by mouth daily.  .Marland Kitchenglimepiride (AMARYL) 4 MG tablet Take 1 tablet (4 mg total) by mouth 2 (two) times daily.  .Marland Kitchenglucose  blood (ACCU-CHEK AVIVA PLUS) test strip Use to check blood sugar twice a day. DX: E11.9  . Insulin Pen Needle (PEN NEEDLES) 31G X 5 MM MISC 1 each by Does not apply route daily as needed.  .Marland Kitchenipratropium-albuterol (DUONEB) 0.5-2.5 (3) MG/3ML SOLN Take 3 mLs by nebulization every 6 (six) hours.  .Marland KitchenLEVEMIR FLEXTOUCH 100 UNIT/ML Pen Inject 80 Units into the skin at bedtime.  . metFORMIN (GLUCOPHAGE) 1000 MG tablet Take 1 tablet (1,000 mg total) by mouth 2 (two) times daily.  .Marland Kitchenomeprazole (PRILOSEC) 20 MG capsule Take 1 capsule (20 mg total) by mouth daily.  .Marland Kitchenoxybutynin (DITROPAN-XL) 10 MG 24 hr tablet Take 1 tablet (10 mg total) by mouth daily.  . polycarbophil (FIBERCON) 625 MG tablet Take 625 mg by mouth every evening.  .Marland KitchenPROAIR HFA 108 (90 Base) MCG/ACT inhaler Inhale 2 puffs into the lungs every 4 (four) hours as needed for wheezing.  . quinapril (ACCUPRIL) 40 MG tablet Take 1 tablet (40 mg total) by mouth daily.  . simvastatin (ZOCOR) 20 MG tablet Take 1 tablet (20 mg total) by mouth every evening.  .Marland KitchenSPIRIVA HANDIHALER 18 MCG inhalation capsule Place 1 capsule (18 mcg total) into inhaler and inhale daily.  . SYMBICORT 80-4.5 MCG/ACT inhaler Inhale 2 puffs into the lungs 2 (two) times daily.  . tamsulosin (FLOMAX) 0.4 MG CAPS capsule Take 1 capsule (0.4 mg total) by mouth at bedtime.  . vitamin B-12 (CYANOCOBALAMIN) 1000 MCG tablet  Take 1,000 mcg by mouth daily.  Marland Kitchen zolpidem (AMBIEN) 10 MG tablet TAKE 1 TABLET(10 MG) BY MOUTH DAILY AS NEEDED FOR SLEEP  . BYETTA 10 MCG PEN 10 MCG/0.04ML SOPN injection Inject 0.04 mLs (10 mcg total) into the skin 2 (two) times daily with a meal. (Patient not taking: Reported on 05/06/2020)  . Lactobacillus (PROBIOTIC ACIDOPHILUS PO) Take 1 capsule by mouth daily. (Patient not taking: Reported on 05/06/2020)  . Potassium 99 MG TABS Take by mouth daily.   . [DISCONTINUED] finasteride (PROSCAR) 5 MG tablet Take 1 tablet (5 mg total) by mouth daily.   No  facility-administered encounter medications on file as of 05/06/2020.    Allergies (verified) Patient has no known allergies.   History: Past Medical History:  Diagnosis Date  . Actinic keratoses   . BPH (benign prostatic hyperplasia)   . COPD (chronic obstructive pulmonary disease) ([place])   . Diabetes mellitus without complication ([place])   . GERD (gastroesophageal reflux disease)   . Hypertension   . Insomnia   . Iron deficiency anemia   . Mixed hyperlipidemia   . Oxygen deficiency    Past Surgical History:  Procedure Laterality Date  . CARDIAC CATHETERIZATION    . CHOLECYSTECTOMY    . COLONOSCOPY WITH PROPOFOL N/A 10/18/2018   Procedure: COLONOSCOPY WITH PROPOFOL;  Surgeon: Lucilla Lame, MD;  Location[facility]COPY;  Service: Endoscopy;  Laterality: N/A;  . HERNIA REPAIR     Family History  Problem Relation Age of Onset  . Heart attack Sister   . Hypertension Sister   . Gout Father    Social History   Socioeconomic History  . Marital status: Married    Spouse name: Not on file  . Number of children: 2  . Years of education: Not on file  . Highest education level: High school graduate  Occupational History  . 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 Smoking status: Former Smoker    Packs/day: 1.50    Years: 50.00    Pack years: 75.00    Types: Cigarettes    Quit date: 09/29/2008    Years since quitting: 11.6  . Smokeless tobacco: Never Used  Vaping Use  . Vaping Use: Never used  Substance and Sexual Activity  . Alcohol use: Yes    Comment: 2 drinks a month / cocktail  . Drug use: No  . Sexual activity: Yes    Partners: Female    Birth control/protection: None  Other Topics Concern  . Not on file  Social History Narrative  . Not on file   Social Determinants of Health   Financial Resource Strain: Low Risk   . Difficulty of Paying Living Expenses: Not hard at all  Food Insecurity: No Food Insecurity  . Worried About  Charity fundraiser in the Last Year: Never true  . Ran Out of Food in the Last Year: Never true  Transportation Needs: No Transportation Needs  . Lack of Transportation (Medical): No  . Lack of Transportation (Non-Medical): No  Physical Activity: Sufficiently Active  . Days of Exercise per Week: 4 days  . Minutes of Exercise per Session: 40 min  Stress: No Stress Concern Present  . Feeling of Stress : Not at all  Social Connections: Moderately Integrated  . Frequency of Communication with Friends and Family: More than three times a week  . Frequency of Social Gatherings with Friends and Family: More than three times a  week  . Attends Religious Services: More than 4 times per year  . Active Member of Clubs or Organizations: No  . Attends Archivist Meetings: Never  . Marital Status: Married    Tobacco Counseling Counseling given: Not Answered   Clinical Intake:  Pre-visit preparation completed: Yes  Pain : No/denies pain     Nutritional Risks: None Diabetes: Yes  How often do you need to have someone help you when you read instructions, pamphlets, or other written materials from your doctor or pharmacy?: 1 - Never  Diabetic? Yes  Nutrition Risk Assessment:  Has the patient had any N/V/D within the last 2 months?  No  Does the patient have any non-healing wounds?  No  Has the patient had any unintentional weight loss or weight gain?  No   Diabetes:  Is the patient diabetic?  Yes  If diabetic, was a CBG obtained today?  No  Did the patient bring in their glucometer from home?  No  How often do you monitor your CBG's? Once a day.   Financial Strains and Diabetes Management:  Are you having any financial strains with the device, your supplies or your medication? No .  Does the patient want to be seen by Chronic Care Management for management of their diabetes?  No  Would the patient like to be referred to a Nutritionist or for Diabetic Management?  No    Diabetic Exams:  Diabetic Eye Exam: Completed 01/08/20. Diabetic Foot Exam: Completed 10/25/19.   Interpreter Needed?: No  Information entered by :: [facility], LPN   Activities of Daily Living In your present state of health, do you have any difficulty performing the following activities: 05/06/2020  Hearing? N  Vision? N  Difficulty concentrating or making decisions? N  Walking or climbing stairs? N  Dressing or bathing? N  Doing errands, shopping? N  Preparing Food and eating ? N  Using the Toilet? N  In the past six months, have you accidently leaked urine? N  Do you have problems with loss of bowel control? N  Managing your Medications? N  Managing your Finances? N  Housekeeping or managing your Housekeeping? N  Some recent data might be hidden    Patient Care Team: Virginia Crews, MD as PCP - General (Family Medicine) Jyl Heinz, MD as Referring Physician (Endocrinology) Crosby Oyster, MD as Referring Physician (Dermatology) Leandrew Koyanagi, MD as Referring Physician (Ophthalmology)  Indicate any recent Medical Services you may have received from other than [facility] providers in the past year (date may be approximate).     Assessment:   This is a routine wellness examination for [place].  Hearing/Vision screen No exam data present  Dietary issues and exercise activities discussed: Current Exercise Habits: Home exercise routine, Type of exercise: treadmill (and biking), Time (Minutes): 45, Frequency (Times/Week): 4, Weekly Exercise (Minutes/Week): 180, Intensity: Mild, Exercise limited by: None identified  Goals    . DIET - REDUCE PORTION SIZE     Recommend to decrease portion sizes by eating 3 small healthy meals and at least 2 healthy snacks per day.      Depression Screen PHQ 2/9 Scores 05/06/2020 04/19/2019 09/29/2018 03/11/2018 12/13/2017 11/30/2017  PHQ - 2 Score 0 0 0 0 3 3  PHQ- 9 Score - - 0 '1 7 9    ' Fall Risk Fall Risk   05/06/2020 04/19/2019 09/29/2018 11/30/2017  Falls in the past year? 0 0 0 No  Number falls in past  yr: 0 - - -  Injury with Fall? 0 - - -    Any stairs in or around the home? Yes  If so, are there any without handrails? No  Home free of loose throw rugs in walkways, pet beds, electrical cords, etc? Yes  Adequate lighting in your home to reduce risk of falls? Yes   ASSISTIVE DEVICES UTILIZED TO PREVENT FALLS:  Life alert? No  Use of a cane, walker or w/c? No  Grab bars in the bathroom? Yes  Shower chair or bench in shower? Yes  Elevated toilet seat or a handicapped toilet? No    Cognitive Function: Declined today.     6CIT Screen 04/19/2019  What Year? 0 points  What month? 0 points  What time? 0 points  Count back from 20 0 points  Months in reverse 0 points  Repeat phrase 0 points  Total Score 0    Immunizations Immunization History  Administered Date(s) Administered  . Influenza, High Dose Seasonal PF 07/24/2015, 07/21/2016, 08/02/2017, 08/06/2017, 07/27/2018  . Influenza,inj,Quad PF,6+ Mos 06/27/2019  . Influenza-Unspecified 09/14/2012  . PFIZER SARS-COV-2 Vaccination 11/10/2019, 12/01/2019  . Pneumococcal Conjugate-13 08/25/2014  . Pneumococcal Polysaccharide-23 01/26/2016  . Pneumococcal-Unspecified 12/17/2005  . Tdap 12/17/2005, 02/25/2016  . Zoster 11/24/2013  . Zoster Recombinat (Shingrix) 05/16/2019    TDAP status: Up to date Flu Vaccine status: Up to date Pneumococcal vaccine status: Up to date Covid-19 vaccine status: Completed vaccines  Qualifies for Shingles Vaccine? Yes   Zostavax completed Yes   Shingrix Completed?: No, first dose completed. Shingrix discussed. Please contact your pharmacy for coverage information.   Screening Tests Health Maintenance  Topic Date Due  . HEMOGLOBIN A1C  04/23/2020  . INFLUENZA VACCINE  05/19/2020  . FOOT EXAM  10/24/2020  . OPHTHALMOLOGY EXAM  01/07/2021  . TETANUS/TDAP  02/24/2026  . COLONOSCOPY  10/18/2028   . COVID-19 Vaccine  Completed  . Hepatitis C Screening  Completed  . PNA vac Low Risk Adult  Completed    Health Maintenance  Health Maintenance Due  Topic Date Due  . HEMOGLOBIN A1C  04/23/2020    Colorectal cancer screening: Completed 10/18/18. Repeat every 10 years  Lung Cancer Screening: (Low Dose CT Chest recommended if Age 27-80 years, 30 pack-year currently smoking OR have quit w/in 15years.) does qualify.   Lung Cancer Screening Referral: An Epic message has been sent to Burgess Estelle, RN (Oncology Nurse Navigator) regarding the possible need for this exam. Raquel Sarna will review the patient's chart to determine if the patient truly qualifies for the exam. If the patient qualifies, Raquel Sarna will order the Low Dose CT of the chest to facilitate the scheduling of this exam.  Additional Screening:  Hepatitis C Screening: Up to date  Vision Screening: Recommended annual ophthalmology exams for early detection of glaucoma and other disorders of the eye. Is the patient up to date with their annual eye exam?  Yes  Who is the provider or what is the name of the office in which the patient attends annual eye exams? Dr Wallace Going @ [facility] If pt is not established with a provider, would they like to be referred to a provider to establish care? No .   Dental Screening: Recommended annual dental exams for proper oral hygiene  Community Resource Referral / Chronic Care Management: CRR required this visit?  No   CCM required this visit?  No      Plan:     I have personally reviewed and noted  the following in the patient's chart:   . Medical and social history . Use of alcohol, tobacco or illicit drugs  . Current medications and supplements . Functional ability and status . Nutritional status . Physical activity . Advanced directives . List of other physicians . Hospitalizations, surgeries, and ER visits in previous 12 months . Vitals . Screenings to include cognitive, depression,  and falls . Referrals and appointments  In addition, I have reviewed and discussed with patient certain preventive protocols, quality metrics, and best practice recommendations. A written personalized care plan for preventive services as well as general preventive health recommendations were provided to patient.     Puneet Selden [place], [place]   7/37/1062   Nurse Notes: Pt needs a Hgb A1c check at next in office apt.

## 2020-04-29 ENCOUNTER — Ambulatory Visit: Payer: Medicare Other

## 2020-04-29 ENCOUNTER — Encounter: Payer: Medicare Other | Admitting: Family Medicine

## 2020-05-03 DIAGNOSIS — J441 Chronic obstructive pulmonary disease with (acute) exacerbation: Secondary | ICD-10-CM | POA: Diagnosis not present

## 2020-05-06 ENCOUNTER — Ambulatory Visit (INDEPENDENT_AMBULATORY_CARE_PROVIDER_SITE_OTHER): Payer: Medicare PPO

## 2020-05-06 ENCOUNTER — Other Ambulatory Visit: Payer: Self-pay

## 2020-05-06 ENCOUNTER — Other Ambulatory Visit: Payer: Self-pay | Admitting: Family Medicine

## 2020-05-06 DIAGNOSIS — Z0000 Encounter for general adult medical examination without abnormal findings: Secondary | ICD-10-CM | POA: Diagnosis not present

## 2020-05-06 MED ORDER — FINASTERIDE 5 MG PO TABS: 5.0000 mg | ORAL_TABLET | Freq: Every day | ORAL | 3 refills | Status: DC

## 2020-05-06 NOTE — Telephone Encounter (Signed)
[facility] faxed refill request for the following medications:  finasteride (PROSCAR) 5 MG tablet   Please advise.  

## 2020-05-06 NOTE — Patient Instructions (Addendum)
[name] , Thank you for taking time to come for your Medicare Wellness Visit. I appreciate your ongoing commitment to your health goals. Please review the following plan we discussed and let me know if I can assist you in the future.   Screening recommendations/referrals: Colonoscopy: Up to date, due 09/2028 Recommended yearly ophthalmology/optometry visit for glaucoma screening and checkup Recommended yearly dental visit for hygiene and checkup  Vaccinations: Influenza vaccine: Done 06/27/19 Pneumococcal vaccine: Completed series Tdap vaccine: Up to date, due 02/2026 Shingles vaccine: First dose completed 05/16/19, second dose due.     Advanced directives: Please bring a copy of your POA (Power of Attorney) and/or Living Will to your next appointment.   Conditions/risks identified: Continue to work on decreasing portion sizes and eating 3 small meals a day with 2 healthy snacks in between.   Next appointment: 08/16/20 @ 1:40 PM with Dr Beryle Flock. Declined scheduling an AWV for 2022 at this time.   Preventive Care 4 Years and Older, Male Preventive care refers to lifestyle choices and visits with your health care provider that can promote health and wellness. What does preventive care include?  A yearly physical exam. This is also called an annual well check.  Dental exams once or twice a year.  Routine eye exams. Ask your health care provider how often you should have your eyes checked.  Personal lifestyle choices, including:  Daily care of your teeth and gums.  Regular physical activity.  Eating a healthy diet.  Avoiding tobacco and drug use.  Limiting alcohol use.  Practicing safe sex.  Taking low doses of aspirin every day.  Taking vitamin and mineral supplements as recommended by your health care provider. What happens during an annual well check? The services and screenings done by your health care provider during your annual well check will depend on your age,  overall health, lifestyle risk factors, and family history of disease. Counseling  Your health care provider may ask you questions about your:  Alcohol use.  Tobacco use.  Drug use.  Emotional well-being.  Home and relationship well-being.  Sexual activity.  Eating habits.  History of falls.  Memory and ability to understand (cognition).  Work and work Astronomer. Screening  You may have the following tests or measurements:  Height, weight, and BMI.  Blood pressure.  Lipid and cholesterol levels. These may be checked every 5 years, or more frequently if you are over 24 years old.  Skin check.  Lung cancer screening. You may have this screening every year starting at age 60 if you have a 30-pack-year history of smoking and currently smoke or have quit within the past 15 years.  Fecal occult blood test (FOBT) of the stool. You may have this test every year starting at age 43.  Flexible sigmoidoscopy or colonoscopy. You may have a sigmoidoscopy every 5 years or a colonoscopy every 10 years starting at age 45.  Prostate cancer screening. Recommendations will vary depending on your family history and other risks.  Hepatitis C blood test.  Hepatitis B blood test.  Sexually transmitted disease (STD) testing.  Diabetes screening. This is done by checking your blood sugar (glucose) after you have not eaten for a while (fasting). You may have this done every 1-3 years.  Abdominal aortic aneurysm (AAA) screening. You may need this if you are a current or former smoker.  Osteoporosis. You may be screened starting at age 51 if you are at high risk. Talk with your health care provider  about your test results, treatment options, and if necessary, the need for more tests. Vaccines  Your health care provider may recommend certain vaccines, such as:  Influenza vaccine. This is recommended every year.  Tetanus, diphtheria, and acellular pertussis (Tdap, Td) vaccine. You may  need a Td booster every 10 years.  Zoster vaccine. You may need this after age 1.  Pneumococcal 13-valent conjugate (PCV13) vaccine. One dose is recommended after age 14.  Pneumococcal polysaccharide (PPSV23) vaccine. One dose is recommended after age 13. Talk to your health care provider about which screenings and vaccines you need and how often you need them. This information is not intended to replace advice given to you by your health care provider. Make sure you discuss any questions you have with your health care provider. Document Released: 11/01/2015 Document Revised: 06/24/2016 Document Reviewed: 08/06/2015 Elsevier Interactive Patient Education  2017 [employer] Prevention in the Home Falls can cause injuries. They can happen to people of all ages. There are many things you can do to make your home safe and to help prevent falls. What can I do on the outside of my home?  Regularly fix the edges of walkways and driveways and fix any cracks.  Remove anything that might make you trip as you walk through a door, such as a raised step or threshold.  Trim any bushes or trees on the path to your home.  Use bright outdoor lighting.  Clear any walking paths of anything that might make someone trip, such as rocks or tools.  Regularly check to see if handrails are loose or broken. Make sure that both sides of any steps have handrails.  Any raised decks and porches should have guardrails on the edges.  Have any leaves, snow, or ice cleared regularly.  Use sand or salt on walking paths during winter.  Clean up any spills in your garage right away. This includes oil or grease spills. What can I do in the bathroom?  Use night lights.  Install grab bars by the toilet and in the tub and shower. Do not use towel bars as grab bars.  Use non-skid mats or decals in the tub or shower.  If you need to sit down in the shower, use a plastic, non-slip stool.  Keep the floor  dry. Clean up any water that spills on the floor as soon as it happens.  Remove soap buildup in the tub or shower regularly.  Attach bath mats securely with double-sided non-slip rug tape.  Do not have throw rugs and other things on the floor that can make you trip. What can I do in the bedroom?  Use night lights.  Make sure that you have a light by your bed that is easy to reach.  Do not use any sheets or blankets that are too big for your bed. They should not hang down onto the floor.  Have a firm chair that has side arms. You can use this for support while you get dressed.  Do not have throw rugs and other things on the floor that can make you trip. What can I do in the kitchen?  Clean up any spills right away.  Avoid walking on wet floors.  Keep items that you use a lot in easy-to-reach places.  If you need to reach something above you, use a strong step stool that has a grab bar.  Keep electrical cords out of the way.  Do not use floor polish or  wax that makes floors slippery. If you must use wax, use non-skid floor wax.  Do not have throw rugs and other things on the floor that can make you trip. What can I do with my stairs?  Do not leave any items on the stairs.  Make sure that there are handrails on both sides of the stairs and use them. Fix handrails that are broken or loose. Make sure that handrails are as long as the stairways.  Check any carpeting to make sure that it is firmly attached to the stairs. Fix any carpet that is loose or worn.  Avoid having throw rugs at the top or bottom of the stairs. If you do have throw rugs, attach them to the floor with carpet tape.  Make sure that you have a light switch at the top of the stairs and the bottom of the stairs. If you do not have them, ask someone to add them for you. What else can I do to help prevent falls?  Wear shoes that:  Do not have high heels.  Have rubber bottoms.  Are comfortable and fit you  well.  Are closed at the toe. Do not wear sandals.  If you use a stepladder:  Make sure that it is fully opened. Do not climb a closed stepladder.  Make sure that both sides of the stepladder are locked into place.  Ask someone to hold it for you, if possible.  Clearly mark and make sure that you can see:  Any grab bars or handrails.  First and last steps.  Where the edge of each step is.  Use tools that help you move around (mobility aids) if they are needed. These include:  Canes.  Walkers.  Scooters.  Crutches.  Turn on the lights when you go into a dark area. Replace any light bulbs as soon as they burn out.  Set up your furniture so you have a clear path. Avoid moving your furniture around.  If any of your floors are uneven, fix them.  If there are any pets around you, be aware of where they are.  Review your medicines with your doctor. Some medicines can make you feel dizzy. This can increase your chance of falling. Ask your doctor what other things that you can do to help prevent falls. This information is not intended to replace advice given to you by your health care provider. Make sure you discuss any questions you have with your health care provider. Document Released: 08/01/2009 Document Revised: 03/12/2016 Document Reviewed: 11/09/2014 Elsevier Interactive Patient Education  2017 [employer].

## 2020-05-08 ENCOUNTER — Other Ambulatory Visit: Payer: Self-pay | Admitting: Family Medicine

## 2020-05-08 MED ORDER — SYMBICORT 80-4.5 MCG/ACT IN AERO: 2.0000 | INHALATION_SPRAY | Freq: Two times a day (BID) | RESPIRATORY_TRACT | 3 refills | Status: DC

## 2020-05-08 MED ORDER — TAMSULOSIN HCL 0.4 MG PO CAPS: 0.4000 mg | ORAL_CAPSULE | Freq: Every day | ORAL | 3 refills | Status: DC

## 2020-05-08 MED ORDER — SPIRIVA HANDIHALER 18 MCG IN CAPS: 18.0000 ug | ORAL_CAPSULE | Freq: Every day | RESPIRATORY_TRACT | 3 refills | Status: DC

## 2020-05-08 MED ORDER — QUINAPRIL HCL 40 MG PO TABS: 40.0000 mg | ORAL_TABLET | Freq: Every day | ORAL | 3 refills | Status: DC

## 2020-05-08 MED ORDER — METFORMIN HCL 1000 MG PO TABS: 1000.0000 mg | ORAL_TABLET | Freq: Two times a day (BID) | ORAL | 3 refills | Status: DC

## 2020-05-08 MED ORDER — ZOLPIDEM TARTRATE 10 MG PO TABS: ORAL_TABLET | ORAL | 1 refills | Status: DC

## 2020-05-08 MED ORDER — OXYBUTYNIN CHLORIDE ER 10 MG PO TB24: 10.0000 mg | ORAL_TABLET | Freq: Every day | ORAL | 3 refills | Status: DC

## 2020-05-08 MED ORDER — OMEPRAZOLE 20 MG PO CPDR: 20.0000 mg | DELAYED_RELEASE_CAPSULE | Freq: Every day | ORAL | 3 refills | Status: DC

## 2020-05-08 NOTE — Telephone Encounter (Signed)
Please advise refills? Last ov 10/25/19. Next appt scheduled for 08/16/2020.

## 2020-05-08 NOTE — Telephone Encounter (Signed)
[facility] faxed refill request for the following medications:  1. tamsulosin (FLOMAX) 0.4 MG CAPS capsule 2. zolpidem (AMBIEN) 10 MG tablet 3. SYMBICORT 80-4.5 MCG/ACT inhaler 4. quinapril (ACCUPRIL) 40 MG tablet 5. oxybutynin (DITROPAN-XL) 10 MG 24 hr tablet 6. omeprazole (PRILOSEC) 20 MG capsule 7. metFORMIN (GLUCOPHAGE) 1000 MG tablet 8. SPIRIVA HANDIHALER 18 MCG inhalation capsule  90 day supply  LOV: 10/25/2019 Please advise. Thanks TNP

## 2020-05-29 DIAGNOSIS — L814 Other melanin hyperpigmentation: Secondary | ICD-10-CM | POA: Diagnosis not present

## 2020-05-29 DIAGNOSIS — D229 Melanocytic nevi, unspecified: Secondary | ICD-10-CM | POA: Diagnosis not present

## 2020-05-29 DIAGNOSIS — L570 Actinic keratosis: Secondary | ICD-10-CM | POA: Diagnosis not present

## 2020-05-29 DIAGNOSIS — Z872 Personal history of diseases of the skin and subcutaneous tissue: Secondary | ICD-10-CM | POA: Diagnosis not present

## 2020-05-29 DIAGNOSIS — L821 Other seborrheic keratosis: Secondary | ICD-10-CM | POA: Diagnosis not present

## 2020-05-29 DIAGNOSIS — I10 Essential (primary) hypertension: Secondary | ICD-10-CM | POA: Diagnosis not present

## 2020-06-03 DIAGNOSIS — J441 Chronic obstructive pulmonary disease with (acute) exacerbation: Secondary | ICD-10-CM | POA: Diagnosis not present

## 2020-06-19 ENCOUNTER — Other Ambulatory Visit: Payer: Self-pay | Admitting: Family Medicine

## 2020-06-19 MED ORDER — AMLODIPINE BESYLATE 10 MG PO TABS
10.0000 mg | ORAL_TABLET | Freq: Every day | ORAL | 0 refills | Status: DC
Start: 2020-06-19 — End: 2020-08-05

## 2020-06-19 MED ORDER — SIMVASTATIN 20 MG PO TABS
20.0000 mg | ORAL_TABLET | Freq: Every evening | ORAL | 0 refills | Status: DC
Start: 2020-06-19 — End: 2020-08-05

## 2020-06-19 NOTE — Telephone Encounter (Signed)
[facility] faxed refill request for the following medications:  1. simvastatin (ZOCOR) 20 MG tablet 2. amLODipine (NORVASC) 10 MG tablet  90 day supply Last Rx: 04/19/2019 LOV: Adriana 03/28/2020 Pt was scheduled for physical 04/29/2020, appt had be rescheduled. Please advise. Thanks TNP

## 2020-07-04 DIAGNOSIS — J441 Chronic obstructive pulmonary disease with (acute) exacerbation: Secondary | ICD-10-CM | POA: Diagnosis not present

## 2020-07-19 ENCOUNTER — Ambulatory Visit (INDEPENDENT_AMBULATORY_CARE_PROVIDER_SITE_OTHER): Payer: Medicare PPO | Admitting: Pulmonary Disease

## 2020-07-19 ENCOUNTER — Other Ambulatory Visit: Payer: Self-pay

## 2020-07-19 ENCOUNTER — Encounter: Payer: Self-pay | Admitting: Pulmonary Disease

## 2020-07-19 VITALS — BP 110/70 | HR 84 | Temp 97.5°F | Ht 66.0 in | Wt 186.6 lb

## 2020-07-19 DIAGNOSIS — J9611 Chronic respiratory failure with hypoxia: Secondary | ICD-10-CM | POA: Diagnosis not present

## 2020-07-19 DIAGNOSIS — E65 Localized adiposity: Secondary | ICD-10-CM

## 2020-07-19 DIAGNOSIS — R0602 Shortness of breath: Secondary | ICD-10-CM | POA: Diagnosis not present

## 2020-07-19 DIAGNOSIS — J849 Interstitial pulmonary disease, unspecified: Secondary | ICD-10-CM | POA: Diagnosis not present

## 2020-07-19 DIAGNOSIS — J449 Chronic obstructive pulmonary disease, unspecified: Secondary | ICD-10-CM | POA: Diagnosis not present

## 2020-07-19 MED ORDER — BREZTRI AEROSPHERE 160-9-4.8 MCG/ACT IN AERO: 2.0000 | INHALATION_SPRAY | Freq: Two times a day (BID) | RESPIRATORY_TRACT | 0 refills | Status: AC

## 2020-07-19 NOTE — Progress Notes (Signed)
Subjective:    Patient ID: Billy Wise, male    DOB: October 26, 1945, 74 y.o.   MRN: [id number]  HPI This is a 74 year old, former smoker, former patient of Dr. Ashby Dawes who presents for follow-up on moderate COPD and dyspnea.  I am assuming care after Dr. Mathis Fare departure from the practice.  I have reviewed the patient's chart and prior studies.  Patient was last seen by Dr. Ashby Dawes on 27 June 2019.  He is on nocturnal oxygen for nocturnal hypoxemia prior sleep study showed no sleep apnea but nocturnal hypoxemia.  He has COPD on the basis of emphysema with associated fibrosis due to prior smoking history and emphysema.  Prior high-resolution CT scan of the chest did not show findings compatible with IPF.  Since his prior visit he has not had any hospital admissions or exacerbations.  He does note increasing shortness of breath with exertion.  He did stop workouts for a while due to Covid lockdowns.  Since then he has built up his gym at home and has started doing exercise on his own however has noted that he has to use his oxygen during exercise.  His last COPD exacerbation was in 2019.  He has had pulmonary rehab previously.  He notes that exertion worsens his symptoms, rest and as needed albuterol help his symptoms.  He feels Symbicort and Spiriva are "okay".  He has not had any chest pain.  No orthopnea or paroxysmal nocturnal dyspnea.  He does have some mild lower extremity edema towards the end of the day.  He voices no other complaint.  Cough has been at baseline usually in the mornings mostly adductive of scant yellowish sputum.  He continues to be abstinent of cigarettes.  Data:  CBC 10/28/17>>eosinophils equals 0.  Sleep Study 11/27/17>> no sleep apnea.  Nocturnal desaturations noted.  Desat walk 11/15/17>> baseline sat on RA at rest was 91% and HR 89. Walked 360 feet at a very brisk pace, was conversational. Sat was 88% and HR 112.  Moderate dyspnea.  CT chest personally  reviewed>> CT hi-res  02/01/18; chest x-ray 10/28/17; there is mild basilar fibrosis, bronchiectatic changes with bronchial thickening, severe apical emphysema. Findings not likely due to UIP.   PFT tracings personally reviewed from 02/01/18>> FVC is 86% predicted, FEV1 is 67% predicted, there is no improvement with bronchodilator.  Ratio 61%. DLCO is reduced at 52%.Flow volume loop is obstructive.    Review of Systems A 10 point review of systems was performed and it is as noted above otherwise negative.  No Known Allergies  Current Meds  Medication Sig  . Accu-Chek Softclix Lancets lancets Use to check blood sugar twice a day. DX E11.9  . acidophilus (RISAQUAD) CAPS capsule Take 1 capsule by mouth daily.  Marland Kitchen amLODipine (NORVASC) 10 MG tablet Take 1 tablet (10 mg total) by mouth daily.  Marland Kitchen ascorbic acid (VITAMIN C) 1000 MG tablet Take 1 tablet by mouth daily.  Marland Kitchen aspirin EC 81 MG tablet Take 81 mg by mouth daily.  . Blood Glucose Monitoring Suppl (ACCU-CHEK AVIVA PLUS) w/Device KIT Use to check blood sugar twice a day.  DX E11.9  . Dulaglutide (TRULICITY) 1.5 MM/3.8TR SOPN Inject 1.5 mg into the skin once a week.   . finasteride (PROSCAR) 5 MG tablet Take 1 tablet (5 mg total) by mouth daily.  Marland Kitchen glimepiride (AMARYL) 4 MG tablet Take 1 tablet (4 mg total) by mouth 2 (two) times daily.  Marland Kitchen glucose blood (ACCU-CHEK AVIVA PLUS)  test strip Use to check blood sugar twice a day. DX: E11.9  . Insulin Pen Needle (PEN NEEDLES) 31G X 5 MM MISC 1 each by Does not apply route daily as needed.  Marland Kitchen ipratropium-albuterol (DUONEB) 0.5-2.5 (3) MG/3ML SOLN Take 3 mLs by nebulization every 6 (six) hours.  . Lactobacillus (PROBIOTIC ACIDOPHILUS PO) Take 1 capsule by mouth daily.   Marland Kitchen LEVEMIR FLEXTOUCH 100 UNIT/ML Pen Inject 80 Units into the skin at bedtime.  . metFORMIN (GLUCOPHAGE) 1000 MG tablet Take 1 tablet (1,000 mg total) by mouth 2 (two) times daily.  Marland Kitchen omeprazole (PRILOSEC) 20 MG capsule Take 1 capsule (20 mg  total) by mouth daily.  Marland Kitchen oxybutynin (DITROPAN-XL) 10 MG 24 hr tablet Take 1 tablet (10 mg total) by mouth daily.  . polycarbophil (FIBERCON) 625 MG tablet Take 625 mg by mouth every evening.  . Potassium 99 MG TABS Take by mouth daily.   Marland Kitchen PROAIR HFA 108 (90 Base) MCG/ACT inhaler Inhale 2 puffs into the lungs every 4 (four) hours as needed for wheezing.  . quinapril (ACCUPRIL) 40 MG tablet Take 1 tablet (40 mg total) by mouth daily.  . simvastatin (ZOCOR) 20 MG tablet Take 1 tablet (20 mg total) by mouth every evening.  Marland Kitchen SPIRIVA HANDIHALER 18 MCG inhalation capsule Place 1 capsule (18 mcg total) into inhaler and inhale daily.  . SYMBICORT 80-4.5 MCG/ACT inhaler Inhale 2 puffs into the lungs 2 (two) times daily.  . tamsulosin (FLOMAX) 0.4 MG CAPS capsule Take 1 capsule (0.4 mg total) by mouth at bedtime.  . vitamin B-12 (CYANOCOBALAMIN) 1000 MCG tablet Take 1,000 mcg by mouth daily.  Marland Kitchen zolpidem (AMBIEN) 10 MG tablet TAKE 1 TABLET(10 MG) BY MOUTH DAILY AS NEEDED FOR SLEEP   Immunization History  Administered Date(s) Administered  . Influenza, High Dose Seasonal PF 07/24/2015, 07/21/2016, 08/02/2017, 08/06/2017, 07/27/2018  . Influenza,inj,Quad PF,6+ Mos 06/27/2019  . Influenza-Unspecified 09/14/2012  . PFIZER SARS-COV-2 Vaccination 11/10/2019, 12/01/2019  . Pneumococcal Conjugate-13 08/25/2014  . Pneumococcal Polysaccharide-23 01/26/2016  . Pneumococcal-Unspecified 12/17/2005  . Tdap 12/17/2005, 02/25/2016  . Zoster 11/24/2013  . Zoster Recombinat (Shingrix) 05/16/2019   Social History   Tobacco Use  . Smoking status: Former Smoker    Packs/day: 1.50    Years: 50.00    Pack years: 75.00    Types: Cigarettes    Quit date: 09/29/2008    Years since quitting: 11.8  . Smokeless tobacco: Never Used  Substance Use Topics  . Alcohol use: Yes    Comment: 2 drinks a month / cocktail      Objective:   Physical Exam BP 110/70 (BP Location: Left Arm, Patient Position: Sitting, Cuff  Size: Normal)   Pulse 84   Temp (!) 97.5 F (36.4 C) (Temporal)   Ht '5\' 6"'  (1.676 m)   Wt 186 lb 9.6 oz (84.6 kg)   SpO2 93%   BMI 30.12 kg/m  GENERAL: Awake, alert, no respiratory distress.  Fully ambulatory. HEAD: Normocephalic, atraumatic.  EYES: Pupils equal, round, reactive to light.  No scleral icterus.  MOUTH: Nose/mouth/throat not examined due to masking requirements for COVID 19. NECK: Supple. No thyromegaly. Trachea midline. No JVD.  No adenopathy. PULMONARY: Good air entry bilaterally.  Coarse, dry crackles at bases, no other adventitious sounds. CARDIOVASCULAR: S1 and S2. Regular rate and rhythm.  No rubs, murmurs or gallops heard. ABDOMEN: Protuberant, otherwise benign. MUSCULOSKELETAL: No joint deformity, no clubbing, trace edema.  NEUROLOGIC: No deficits, no gait disturbance, speech is fluent. SKIN:  Intact,warm,dry.  Stasis changes lower extremities. PSYCH: Mood and behavior normal.    Assessment & Plan:     ICD-10-CM   1. Shortness of breath  R06.02 Pulmonary Function Test [facility] Only    ECHOCARDIOGRAM COMPLETE   Multifactorial PFTs 2D echo to exclude cardiac etiology Supplemental oxygen with ambulation See below   2. Moderate COPD (chronic obstructive pulmonary disease) (HCC)  J44.9 AMB REFERRAL FOR DME   Poorly compensated Trial of Breztri 2 puffs twice a day PFTs  3. Interstitial pulmonary disease (HCC)  J84.9    Related to smoking/COPD Pattern not consistent with IPF  4. Chronic respiratory failure with hypoxia (HCC)  J96.11    Compliant with nocturnal oxygen Ambulatory oximetry showed desaturation with exercise Maintains with 2 L/min during ambulation  5. Truncal obesity  E65    Weight loss recommended   Orders Placed This Encounter  Procedures  . AMB REFERRAL FOR DME    Referral Priority:   Routine    Referral Type:   Durable Medical Equipment Purchase    Number of Visits Requested:   1  . Pulmonary Function Test [facility] Only    Standing Status:    Future    Standing Expiration Date:   07/19/2021    Order Specific Question:   Full PFT: includes the following: basic spirometry, spirometry pre & post bronchodilator, diffusion capacity (DLCO), lung volumes    Answer:   Full PFT  . ECHOCARDIOGRAM COMPLETE    Standing Status:   Future    Standing Expiration Date:   07/19/2021    Order Specific Question:   Where should this test be performed    Answer:   [facility]    Order Specific Question:   Please indicate who you request to read the echo results.    Answer:   [facility]    Order Specific Question:   Perflutren DEFINITY (image enhancing agent) should be administered unless hypersensitivity or allergy exist    Answer:   Administer Perflutren    Order Specific Question:   Reason for exam-Echo    Answer:   Dyspnea  786.09 / R06.00   Meds ordered this encounter  Medications  . Budeson-Glycopyrrol-Formoterol (BREZTRI AEROSPHERE) 160-9-4.8 MCG/ACT AERO    Sig: Inhale 2 puffs into the lungs in the morning and at bedtime for 1 day.    Dispense:  5.9 g    Refill:  0    Order Specific Question:   Lot Number?    Answer:   [id number]    Order Specific Question:   Expiration Date?    Answer:   11/19/2021    Order Specific Question:   Manufacturer?    Answer:   [employer] [[id number]]   Discussion:  Patient has shown significant desaturations with ambulation.  We will proceed with ordering oxygen 2 L/min with activity, he is already on oxygen at nighttime he should continue this.  He would like a portable oxygen concentrator if he meets criteria we will send the order to [facility].  We will see him in follow-up in 2 to 3 months time he is to contact us prior to that time should any new problems arise.  Please see orders for details as above.  Renold Don, MD [facility]   *This note was dictated using voice recognition software/Dragon.  Despite best efforts to proofread, errors can occur which can change the meaning.  Any change was  purely unintentional.

## 2020-07-19 NOTE — Patient Instructions (Signed)
The oxygen test today showed that you need oxygen when you exert yourself.  We will send the updated oxygen prescription to [facility], will have them consider a portable concentrator.  We are giving you a trial of medication called BREZTRI.  Please let us know how you do with this medication so we can send the prescription in.  Do not take Symbicort or Spiriva while on the [place].  We have schedule breathing tests and a heart test.  We will see you in follow-up in 2 to 3 months time.  Call sooner should any new problems arise.

## 2020-07-25 ENCOUNTER — Other Ambulatory Visit
Admission: RE | Admit: 2020-07-25 | Discharge: 2020-07-25 | Disposition: A | Discharge status: Home / Self Care | Payer: Medicare PPO | Source: Ambulatory Visit | Attending: Pulmonary Disease | Admitting: Pulmonary Disease

## 2020-07-25 ENCOUNTER — Other Ambulatory Visit: Payer: Self-pay

## 2020-07-25 DIAGNOSIS — Z01818 Encounter for other preprocedural examination: Secondary | ICD-10-CM | POA: Diagnosis not present

## 2020-07-25 DIAGNOSIS — Z20822 Contact with and (suspected) exposure to covid-19: Secondary | ICD-10-CM | POA: Insufficient documentation

## 2020-07-25 LAB — SARS CORONAVIRUS 2 (TAT 6-24 HRS): SARS Coronavirus 2: NEGATIVE

## 2020-07-26 ENCOUNTER — Ambulatory Visit: Payer: Medicare PPO | Attending: Pulmonary Disease

## 2020-07-26 DIAGNOSIS — R0602 Shortness of breath: Secondary | ICD-10-CM | POA: Insufficient documentation

## 2020-07-26 DIAGNOSIS — I5189 Other ill-defined heart diseases: Secondary | ICD-10-CM | POA: Insufficient documentation

## 2020-07-26 DIAGNOSIS — I2729 Other secondary pulmonary hypertension: Secondary | ICD-10-CM | POA: Insufficient documentation

## 2020-07-26 MED ORDER — ALBUTEROL SULFATE (2.5 MG/3ML) 0.083% IN NEBU
2.5000 mg | INHALATION_SOLUTION | Freq: Once | RESPIRATORY_TRACT | Status: AC
  Administered 2020-07-26: 2.5 mg via RESPIRATORY_TRACT
  Filled 2020-07-26: qty 3

## 2020-07-29 ENCOUNTER — Other Ambulatory Visit: Payer: Self-pay | Admitting: Physician Assistant

## 2020-07-29 NOTE — Telephone Encounter (Signed)
Requested medication (s) are due for refill today: yes  Requested medication (s) are on the active medication list: yes  Last refill:  04/23/20 #180 0 refills   Future visit scheduled: yes in 1 week  Notes to clinic:  HA1C last drawn 02/09/2019 : refill for 3 months?     Requested Prescriptions  Pending Prescriptions Disp Refills   glimepiride (AMARYL) 4 MG tablet [Pharmacy Med Name: GLIMEPIRIDE 4MG  TABLETS] 180 tablet 0    Sig: TAKE 1 TABLET(4 MG) BY MOUTH TWICE DAILY      Endocrinology:  Diabetes - Sulfonylureas Failed - 07/29/2020  5:10 PM      Failed - HBA1C is between 0 and 7.9 and within 180 days    Hemoglobin A1C  Date Value Ref Range Status  02/09/2019 6.8  Final   Hgb A1c MFr Bld  Date Value Ref Range Status  10/25/2019 6.7 (H) 4.8 - 5.6 % Final    Comment:             Prediabetes: 5.7 - 6.4          Diabetes: >6.4          Glycemic control for adults with diabetes: <7.0           Passed - Valid encounter within last 6 months    Recent Outpatient Visits           4 months ago COPD exacerbation [facility])   [facility] M, PA-C   9 months ago Essential hypertension   M, [facility], MD   1 year ago Encounter for annual physical exam   [facility] [place], [place], MD   1 year ago URI, acute   [facility] [place], [place], [place]   1 year ago Respiratory crackles at left lung base   [facility] [place], [place], MD       Future Appointments             In 1 week Marzella Schlein, Rosezetta Schlatter, PA-C Alessandra Bevels, [facility]

## 2020-07-30 LAB — PULMONARY FUNCTION TEST ARMC ONLY
DL/VA % pred: 60 %
DL/VA: 2.5 ml/min/mmHg/L
DLCO unc % pred: 57 %
DLCO unc: 13.13 ml/min/mmHg
FEF 25-75 Post: 0.83 L/sec
FEF 25-75 Pre: 0.67 L/sec
FEF2575-%Change-Post: 23 %
FEF2575-%Pred-Post: 40 %
FEF2575-%Pred-Pre: 32 %
FEV1-%Change-Post: 10 %
FEV1-%Pred-Post: 63 %
FEV1-%Pred-Pre: 57 %
FEV1-Post: 1.73 L
FEV1-Pre: 1.57 L
FEV1FVC-%Change-Post: 6 %
FEV1FVC-%Pred-Pre: 76 %
FEV6-%Change-Post: 6 %
FEV6-%Pred-Post: 81 %
FEV6-%Pred-Pre: 76 %
FEV6-Post: 2.85 L
FEV6-Pre: 2.69 L
FEV6FVC-%Change-Post: 2 %
FEV6FVC-%Pred-Post: 105 %
FEV6FVC-%Pred-Pre: 102 %
FVC-%Change-Post: 3 %
FVC-%Pred-Post: 77 %
FVC-%Pred-Pre: 74 %
FVC-Post: 2.88 L
Post FEV1/FVC ratio: 60 %
Post FEV6/FVC ratio: 99 %
Pre FEV1/FVC ratio: 56 %
Pre FEV6/FVC Ratio: 96 %
RV % pred: 129 %
RV: 2.91 L
TLC % pred: 87 %
TLC: 5.43 L

## 2020-07-31 ENCOUNTER — Ambulatory Visit
Admission: RE | Admit: 2020-07-31 | Discharge: 2020-07-31 | Disposition: A | Discharge status: Home / Self Care | Payer: Medicare PPO | Source: Ambulatory Visit | Attending: Pulmonary Disease | Admitting: Pulmonary Disease

## 2020-07-31 ENCOUNTER — Other Ambulatory Visit: Payer: Self-pay

## 2020-07-31 DIAGNOSIS — R0602 Shortness of breath: Secondary | ICD-10-CM

## 2020-07-31 LAB — ECHOCARDIOGRAM COMPLETE
AR max vel: 1.74 cm2
AV Area VTI: 1.79 cm2
AV Area mean vel: 1.6 cm2
AV Mean grad: 6 mmHg
AV Peak grad: 10.7 mmHg
Ao pk vel: 1.64 m/s
Area-P 1/2: 2.08 cm2
S' Lateral: 2.9 cm

## 2020-07-31 NOTE — Progress Notes (Signed)
*  PRELIMINARY RESULTS* Echocardiogram 2D Echocardiogram has been performed.  Cristela Blue 07/31/2020, 10:30 AM

## 2020-08-03 DIAGNOSIS — J441 Chronic obstructive pulmonary disease with (acute) exacerbation: Secondary | ICD-10-CM | POA: Diagnosis not present

## 2020-08-05 ENCOUNTER — Other Ambulatory Visit: Payer: Self-pay

## 2020-08-05 ENCOUNTER — Encounter: Payer: Self-pay | Admitting: Physician Assistant

## 2020-08-05 ENCOUNTER — Ambulatory Visit (INDEPENDENT_AMBULATORY_CARE_PROVIDER_SITE_OTHER): Payer: Medicare PPO | Admitting: Physician Assistant

## 2020-08-05 VITALS — BP 131/91 | HR 104 | Temp 97.5°F | Resp 16 | Ht 66.0 in | Wt 189.8 lb

## 2020-08-05 DIAGNOSIS — R252 Cramp and spasm: Secondary | ICD-10-CM

## 2020-08-05 DIAGNOSIS — J418 Mixed simple and mucopurulent chronic bronchitis: Secondary | ICD-10-CM

## 2020-08-05 DIAGNOSIS — E782 Mixed hyperlipidemia: Secondary | ICD-10-CM | POA: Diagnosis not present

## 2020-08-05 DIAGNOSIS — Z794 Long term (current) use of insulin: Secondary | ICD-10-CM

## 2020-08-05 DIAGNOSIS — Z0000 Encounter for general adult medical examination without abnormal findings: Secondary | ICD-10-CM | POA: Diagnosis not present

## 2020-08-05 DIAGNOSIS — N138 Other obstructive and reflux uropathy: Secondary | ICD-10-CM

## 2020-08-05 DIAGNOSIS — E1169 Type 2 diabetes mellitus with other specified complication: Secondary | ICD-10-CM

## 2020-08-05 DIAGNOSIS — N401 Enlarged prostate with lower urinary tract symptoms: Secondary | ICD-10-CM

## 2020-08-05 DIAGNOSIS — Z23 Encounter for immunization: Secondary | ICD-10-CM

## 2020-08-05 DIAGNOSIS — I10 Essential (primary) hypertension: Secondary | ICD-10-CM

## 2020-08-05 MED ORDER — TAMSULOSIN HCL 0.4 MG PO CAPS: 0.4000 mg | ORAL_CAPSULE | Freq: Every day | ORAL | 3 refills | Status: DC

## 2020-08-05 MED ORDER — AMLODIPINE BESYLATE 10 MG PO TABS: 10.0000 mg | ORAL_TABLET | Freq: Every day | ORAL | 3 refills | Status: DC

## 2020-08-05 MED ORDER — OXYBUTYNIN CHLORIDE ER 10 MG PO TB24: 10.0000 mg | ORAL_TABLET | Freq: Every day | ORAL | 3 refills | Status: DC

## 2020-08-05 MED ORDER — LEVEMIR FLEXTOUCH 100 UNIT/ML ~~LOC~~ SOPN: 80.0000 [IU] | PEN_INJECTOR | Freq: Every day | SUBCUTANEOUS | 3 refills | Status: DC

## 2020-08-05 MED ORDER — METFORMIN HCL 1000 MG PO TABS: 1000.0000 mg | ORAL_TABLET | Freq: Two times a day (BID) | ORAL | 3 refills | Status: DC

## 2020-08-05 MED ORDER — SIMVASTATIN 20 MG PO TABS: 20.0000 mg | ORAL_TABLET | Freq: Every evening | ORAL | 3 refills | Status: DC

## 2020-08-05 MED ORDER — TRULICITY 1.5 MG/0.5ML ~~LOC~~ SOAJ: 1.5000 mg | SUBCUTANEOUS | 6 refills | Status: DC

## 2020-08-05 MED ORDER — GLIMEPIRIDE 4 MG PO TABS: ORAL_TABLET | ORAL | 3 refills | Status: DC

## 2020-08-05 MED ORDER — ZOLPIDEM TARTRATE 10 MG PO TABS: ORAL_TABLET | ORAL | 1 refills | Status: DC

## 2020-08-05 MED ORDER — QUINAPRIL HCL 40 MG PO TABS: 40.0000 mg | ORAL_TABLET | Freq: Every day | ORAL | 3 refills | Status: DC

## 2020-08-05 MED ORDER — PROAIR HFA 108 (90 BASE) MCG/ACT IN AERS: 2.0000 | INHALATION_SPRAY | RESPIRATORY_TRACT | 5 refills | Status: DC | PRN

## 2020-08-05 MED ORDER — OMEPRAZOLE 20 MG PO CPDR: 20.0000 mg | DELAYED_RELEASE_CAPSULE | Freq: Every day | ORAL | 3 refills | Status: DC

## 2020-08-05 MED ORDER — IPRATROPIUM-ALBUTEROL 0.5-2.5 (3) MG/3ML IN SOLN: 3.0000 mL | Freq: Four times a day (QID) | RESPIRATORY_TRACT | 2 refills | Status: DC

## 2020-08-05 MED ORDER — FINASTERIDE 5 MG PO TABS: 5.0000 mg | ORAL_TABLET | Freq: Every day | ORAL | 3 refills | Status: DC

## 2020-08-05 NOTE — Progress Notes (Signed)
Complete physical exam   Patient: Billy Wise   DOB: 26-Dec-1945   74 y.o. Male  MRN: [id number] Visit Date: 08/05/2020  Today's healthcare provider: Mar Daring, PA-C   Chief Complaint  Patient presents with  . Annual Exam   Subjective    Billy Wise is a 74 y.o. male who presents today for a complete physical exam.  He reports consuming a general diet. Home exercise routine includes stretching, treadmill and weights and stationary bike. He generally feels fairly well. He reports sleeping poorly. He does have additional problems to discuss today. Reports that he is not sleeping well because he is having a lot of leg cramps. HPI  Patient had AWV with [facility] 05/06/2020  He had an Echo cardiogram last week and and saw pulmonary 2 weeks ago. Started on a new inhaler Judithann Sauger). Has stopped Symbicort and Spiriva. Also has been ordered to have portable oxygen for activity. He reports he has an appt with his insurance company on 08/20/20 for further testing for approval.   Past Medical History:  Diagnosis Date  . Actinic keratoses   . BPH (benign prostatic hyperplasia)   . COPD (chronic obstructive pulmonary disease) ([place])   . Diabetes mellitus without complication ([place])   . GERD (gastroesophageal reflux disease)   . Hypertension   . Insomnia   . Iron deficiency anemia   . Mixed hyperlipidemia   . Oxygen deficiency    Past Surgical History:  Procedure Laterality Date  . CARDIAC CATHETERIZATION    . CHOLECYSTECTOMY    . COLONOSCOPY WITH PROPOFOL N/A 10/18/2018   Procedure: COLONOSCOPY WITH PROPOFOL;  Surgeon: Lucilla Lame, MD;  Location: [facility] ENDOSCOPY;  Service: Endoscopy;  Laterality: N/A;  . HERNIA REPAIR     Social History   Socioeconomic History  . Marital status: Married    Spouse name: Not on file  . Number of children: 2  . Years of education: Not on file  . Highest education level: High school graduate  Occupational History  . 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 Smoking status: Former Smoker    Packs/day: 1.50    Years: 50.00    Pack years: 75.00    Types: Cigarettes    Quit date: 09/29/2008    Years since quitting: 11.8  . Smokeless tobacco: Never Used  Vaping Use  . Vaping Use: Never used  Substance and Sexual Activity  . Alcohol use: Yes    Comment: 2 drinks a month / cocktail  . Drug use: No  . Sexual activity: Yes    Partners: Female    Birth control/protection: None  Other Topics Concern  . Not on file  Social History Narrative  . Not on file   Social Determinants of Health   Financial Resource Strain: Low Risk   . Difficulty of Paying Living Expenses: Not hard at all  Food Insecurity: No Food Insecurity  . Worried About Charity fundraiser in the Last Year: Never true  . Ran Out of Food in the Last Year: Never true  Transportation Needs: No Transportation Needs  . Lack of Transportation (Medical): No  . Lack of Transportation (Non-Medical): No  Physical Activity: Sufficiently Active  . Days of Exercise per Week: 4 days  . Minutes of Exercise per Session: 40 min  Stress: No Stress Concern Present  . Feeling of Stress : Not at all  Social Connections: Moderately Integrated  .  Frequency of Communication with Friends and Family: More than three times a week  . Frequency of Social Gatherings with Friends and Family: More than three times a week  . Attends Religious Services: More than 4 times per year  . Active Member of Clubs or Organizations: No  . Attends Archivist Meetings: Never  . Marital Status: Married  Human resources officer Violence: Not At Risk  . Fear of Current or Ex-Partner: No  . Emotionally Abused: No  . Physically Abused: No  . Sexually Abused: No   Family Status  Relation Name Status  . Sister  (Not Specified)  . Mother  Deceased at age 8  . Father  Deceased at age 82       died of anesthesia complications   Family History    Problem Relation Age of Onset  . Heart attack Sister   . Hypertension Sister   . Gout Father    No Known Allergies  Patient Care Team: Virginia Crews, MD as PCP - General (Family Medicine) Jyl Heinz, MD as Referring Physician (Endocrinology) Crosby Oyster, MD as Referring Physician (Dermatology) Leandrew Koyanagi, MD as Referring Physician (Ophthalmology)   Medications: Outpatient Medications Prior to Visit  Medication Sig  . Accu-Chek Softclix Lancets lancets Use to check blood sugar twice a day. DX E11.9  . acidophilus (RISAQUAD) CAPS capsule Take 1 capsule by mouth daily.  Marland Kitchen ascorbic acid (VITAMIN C) 1000 MG tablet Take 1 tablet by mouth daily.  Marland Kitchen aspirin EC 81 MG tablet Take 81 mg by mouth daily.  . Blood Glucose Monitoring Suppl (ACCU-CHEK AVIVA PLUS) w/Device KIT Use to check blood sugar twice a day.  DX E11.9  . glucose blood (ACCU-CHEK AVIVA PLUS) test strip Use to check blood sugar twice a day. DX: E11.9  . Insulin Pen Needle (PEN NEEDLES) 31G X 5 MM MISC 1 each by Does not apply route daily as needed.  . Lactobacillus (PROBIOTIC ACIDOPHILUS PO) Take 1 capsule by mouth daily.   . polycarbophil (FIBERCON) 625 MG tablet Take 625 mg by mouth every evening.  . Potassium 99 MG TABS Take by mouth daily.   . vitamin B-12 (CYANOCOBALAMIN) 1000 MCG tablet Take 1,000 mcg by mouth daily.  . [DISCONTINUED] amLODipine (NORVASC) 10 MG tablet Take 1 tablet (10 mg total) by mouth daily.  . [DISCONTINUED] Dulaglutide (TRULICITY) 1.5 PZ/0.2HE SOPN Inject 1.5 mg into the skin once a week.   . [DISCONTINUED] finasteride (PROSCAR) 5 MG tablet Take 1 tablet (5 mg total) by mouth daily.  . [DISCONTINUED] glimepiride (AMARYL) 4 MG tablet TAKE 1 TABLET(4 MG) BY MOUTH TWICE DAILY  . [DISCONTINUED] ipratropium-albuterol (DUONEB) 0.5-2.5 (3) MG/3ML SOLN Take 3 mLs by nebulization every 6 (six) hours.  . [DISCONTINUED] LEVEMIR FLEXTOUCH 100 UNIT/ML Pen Inject 80 Units into the  skin at bedtime.  . [DISCONTINUED] metFORMIN (GLUCOPHAGE) 1000 MG tablet Take 1 tablet (1,000 mg total) by mouth 2 (two) times daily.  . [DISCONTINUED] omeprazole (PRILOSEC) 20 MG capsule Take 1 capsule (20 mg total) by mouth daily.  . [DISCONTINUED] oxybutynin (DITROPAN-XL) 10 MG 24 hr tablet Take 1 tablet (10 mg total) by mouth daily.  . [DISCONTINUED] PROAIR HFA 108 (90 Base) MCG/ACT inhaler Inhale 2 puffs into the lungs every 4 (four) hours as needed for wheezing.  . [DISCONTINUED] quinapril (ACCUPRIL) 40 MG tablet Take 1 tablet (40 mg total) by mouth daily.  . [DISCONTINUED] simvastatin (ZOCOR) 20 MG tablet Take 1 tablet (20 mg total) by  mouth every evening.  . [DISCONTINUED] tamsulosin (FLOMAX) 0.4 MG CAPS capsule Take 1 capsule (0.4 mg total) by mouth at bedtime.  . [DISCONTINUED] zolpidem (AMBIEN) 10 MG tablet TAKE 1 TABLET(10 MG) BY MOUTH DAILY AS NEEDED FOR SLEEP  . Budeson-Glycopyrrol-Formoterol (BREZTRI AEROSPHERE) 160-9-4.8 MCG/ACT AERO Inhale into the lungs.  . [DISCONTINUED] BYETTA 10 MCG PEN 10 MCG/0.04ML SOPN injection Inject 0.04 mLs (10 mcg total) into the skin 2 (two) times daily with a meal. (Patient not taking: Reported on 07/19/2020)  . [DISCONTINUED] SPIRIVA HANDIHALER 18 MCG inhalation capsule Place 1 capsule (18 mcg total) into inhaler and inhale daily. (Patient not taking: Reported on 08/05/2020)  . [DISCONTINUED] SYMBICORT 80-4.5 MCG/ACT inhaler Inhale 2 puffs into the lungs 2 (two) times daily. (Patient not taking: Reported on 08/05/2020)   No facility-administered medications prior to visit.    Review of Systems  Constitutional: Negative.   HENT: Negative.   Eyes: Negative.   Respiratory: Positive for shortness of breath.   Cardiovascular: Negative.   Gastrointestinal: Negative.   Endocrine: Negative.   Genitourinary: Negative.   Musculoskeletal: Negative.   Skin: Negative.   Allergic/Immunologic: Negative.   Neurological: Negative.   Hematological:  Negative.   Psychiatric/Behavioral: Negative.     Last CBC Lab Results  Component Value Date   WBC 6.5 04/19/2019   HGB 14.3 04/19/2019   HCT 41.5 04/19/2019   MCV 82 04/19/2019   MCH 28.3 04/19/2019   RDW 13.3 04/19/2019   PLT 225 54/00/8676   Last metabolic panel Lab Results  Component Value Date   GLUCOSE 87 10/25/2019   NA 138 10/25/2019   K 4.4 10/25/2019   CL 98 10/25/2019   CO2 21 10/25/2019   BUN 12 10/25/2019   CREATININE 0.84 10/25/2019   GFRNONAA 88 10/25/2019   GFRAA 102 10/25/2019   CALCIUM 9.5 10/25/2019   PROT 7.8 10/25/2019   ALBUMIN 4.5 10/25/2019   LABGLOB 3.3 10/25/2019   AGRATIO 1.4 10/25/2019   BILITOT 0.2 10/25/2019   ALKPHOS 76 10/25/2019   AST 45 (H) 10/25/2019   ALT 34 10/25/2019   ANIONGAP 12 11/01/2017      Objective    BP (!) 131/91 (BP Location: Left Arm, Patient Position: Sitting, Cuff Size: Large)   Pulse (!) 104   Temp (!) 97.5 F (36.4 C) (Oral)   Resp 16   Ht '5\' 6"'  (1.676 m)   Wt 189 lb 12.8 oz (86.1 kg)   SpO2 91% Comment: COPD  BMI 30.63 kg/m  BP Readings from Last 3 Encounters:  08/05/20 (!) 131/91  07/19/20 110/70  10/25/19 131/71   Wt Readings from Last 3 Encounters:  08/05/20 189 lb 12.8 oz (86.1 kg)  07/19/20 186 lb 9.6 oz (84.6 kg)  10/25/19 185 lb (83.9 kg)      Physical Exam Vitals reviewed.  Constitutional:      General: He is not in acute distress.    Appearance: Normal appearance. He is well-developed and well-groomed. He is obese. He is not ill-appearing.  HENT:     Head: Normocephalic and atraumatic.     Right Ear: Tympanic membrane, ear canal and external ear normal.     Left Ear: Tympanic membrane, ear canal and external ear normal.     Nose: Nose normal.     Mouth/Throat:     Mouth: Mucous membranes are moist.     Pharynx: Oropharynx is clear. No oropharyngeal exudate or posterior oropharyngeal erythema.  Eyes:     General:  Right eye: No discharge.        Left eye: No discharge.       Extraocular Movements: Extraocular movements intact.     Conjunctiva/sclera: Conjunctivae normal.     Pupils: Pupils are equal, round, and reactive to light.  Neck:     Thyroid: No thyromegaly.     Vascular: No carotid bruit.     Trachea: No tracheal deviation.  Cardiovascular:     Rate and Rhythm: Normal rate and regular rhythm.     Pulses: Normal pulses.     Heart sounds: Normal heart sounds. No murmur heard.   Pulmonary:     Effort: Pulmonary effort is normal. No respiratory distress.     Breath sounds: Normal breath sounds. No wheezing or rales.  Chest:     Chest wall: No tenderness.  Abdominal:     General: Abdomen is flat. Bowel sounds are normal. There is no distension.     Palpations: Abdomen is soft. There is no mass.     Tenderness: There is no abdominal tenderness. There is no guarding or rebound.  Musculoskeletal:        General: No tenderness. Normal range of motion.     Cervical back: Normal range of motion and neck supple. No tenderness.     Right lower leg: No edema.     Left lower leg: No edema.  Lymphadenopathy:     Cervical: No cervical adenopathy.  Skin:    General: Skin is warm and dry.     Capillary Refill: Capillary refill takes less than 2 seconds.     Findings: No erythema or rash.  Neurological:     General: No focal deficit present.     Mental Status: He is alert and oriented to person, place, and time. Mental status is at baseline.     Cranial Nerves: No cranial nerve deficit.     Motor: No abnormal muscle tone.     Coordination: Coordination normal.     Deep Tendon Reflexes: Reflexes are normal and symmetric. Reflexes normal.  Psychiatric:        Mood and Affect: Mood normal.        Behavior: Behavior normal. Behavior is cooperative.        Thought Content: Thought content normal.        Judgment: Judgment normal.       Last depression screening scores PHQ 2/9 Scores 05/06/2020 04/19/2019 09/29/2018  PHQ - 2 Score 0 0 0  PHQ- 9 Score -  - 0   Last fall risk screening Fall Risk  05/06/2020  Falls in the past year? 0  Number falls in past yr: 0  Injury with Fall? 0   Last Audit-C alcohol use screening Alcohol Use Disorder Test (AUDIT) 05/06/2020  1. How often do you have a drink containing alcohol? 1  2. How many drinks containing alcohol do you have on a typical day when you are drinking? 0  3. How often do you have six or more drinks on one occasion? 0  AUDIT-C Score 1  Alcohol Brief Interventions/Follow-up AUDIT Score <7 follow-up not indicated   A score of 3 or more in women, and 4 or more in men indicates increased risk for alcohol abuse, EXCEPT if all of the points are from question 1   No results found for any visits on 08/05/20.  Assessment & Plan    Routine Health Maintenance and Physical Exam  Exercise Activities and Dietary recommendations Goals    .  DIET - REDUCE PORTION SIZE     Recommend to decrease portion sizes by eating 3 small healthy meals and at least 2 healthy snacks per day.       Immunization History  Administered Date(s) Administered  . Fluad Quad(high Dose 65+) 08/05/2020  . Influenza, High Dose Seasonal PF 07/24/2015, 07/21/2016, 08/02/2017, 08/06/2017, 07/27/2018  . Influenza,inj,Quad PF,6+ Mos 06/27/2019  . Influenza-Unspecified 09/14/2012  . PFIZER SARS-COV-2 Vaccination 11/10/2019, 12/01/2019  . Pneumococcal Conjugate-13 08/25/2014  . Pneumococcal Polysaccharide-23 01/26/2016  . Pneumococcal-Unspecified 12/17/2005  . Tdap 12/17/2005, 02/25/2016  . Zoster 11/24/2013  . Zoster Recombinat (Shingrix) 05/16/2019    Health Maintenance  Topic Date Due  . HEMOGLOBIN A1C  04/23/2020  . FOOT EXAM  10/24/2020  . OPHTHALMOLOGY EXAM  01/07/2021  . TETANUS/TDAP  02/24/2026  . COLONOSCOPY  10/18/2028  . INFLUENZA VACCINE  Completed  . COVID-19 Vaccine  Completed  . Hepatitis C Screening  Completed  . PNA vac Low Risk Adult  Completed    Discussed health benefits of physical  activity, and encouraged him to engage in regular exercise appropriate for his age and condition.  1. Annual physical exam Normal physical exam today. Will check labs as below and f/u pending lab results. If labs are stable and WNL he will not need to have these rechecked for one year at his next annual physical exam. He is to call the office in the meantime if he has any acute issue, questions or concerns.  2. Primary hypertension Stable. Diagnosis pulled for medication refill. Continue current medical treatment plan. Will check labs as below and f/u pending results. - CBC with Differential/Platelet - Comprehensive metabolic panel - Lipid panel - Hemoglobin A1c - TSH - amLODipine (NORVASC) 10 MG tablet; Take 1 tablet (10 mg total) by mouth daily.  Dispense: 90 tablet; Refill: 3  3. Mixed simple and mucopurulent chronic bronchitis (HCC) Stable. Diagnosis pulled for medication refill. Continue current medical treatment plan. Followed by Pulmonology, Dr. Patsey Berthold.  - Budeson-Glycopyrrol-Formoterol (BREZTRI AEROSPHERE) 160-9-4.8 MCG/ACT AERO; Inhale into the lungs.  4. Type 2 diabetes mellitus with other specified complication, with long-term current use of insulin (HCC) Stable. Continue current medical treatment plan. Followed by Endocrinology. Will check labs as below and f/u pending results. - CBC with Differential/Platelet - Comprehensive metabolic panel - Lipid panel - Hemoglobin A1c - TSH  5. BPH with obstruction/lower urinary tract symptoms Stable on medications. Still has mild nocturia, but medications help. Does not see Urology regularly.   6. Mixed hyperlipidemia Stable on Simvastatin. Will check labs as below and f/u pending results. - CBC with Differential/Platelet - Comprehensive metabolic panel - Lipid panel - Hemoglobin A1c - TSH  7. Need for influenza vaccination Flu vaccine given today without complication. Patient sat upright for 15 minutes to check for adverse  reaction before being released. - Flu Vaccine QUAD High Dose(Fluad)  8. Leg cramps Will check labs as below and f/u pending results. If labs are normal, will send in a low dose muscle relaxer for him to use prn.  - Magnesium   Return if symptoms worsen or fail to improve.     I, [place], CMA, have reviewed all documentation for this visit. The documentation on 08/05/20 for the exam, diagnosis, procedures, and orders are all accurate and complete.   Rubye Beach  [facility] [phone] (phone) [phone] (fax)  [place]

## 2020-08-05 NOTE — Patient Instructions (Signed)

## 2020-08-06 ENCOUNTER — Telehealth: Payer: Self-pay

## 2020-08-06 ENCOUNTER — Encounter: Payer: Self-pay | Admitting: Physician Assistant

## 2020-08-06 LAB — COMPREHENSIVE METABOLIC PANEL
ALT: 26 IU/L (ref 0–44)
AST: 20 IU/L (ref 0–40)
Albumin/Globulin Ratio: 1.2 (ref 1.2–2.2)
Albumin: 4.2 g/dL (ref 3.7–4.7)
Alkaline Phosphatase: 81 IU/L (ref 44–121)
BUN/Creatinine Ratio: 17 (ref 10–24)
BUN: 16 mg/dL (ref 8–27)
Bilirubin Total: 0.2 mg/dL (ref 0.0–1.2)
CO2: 24 mmol/L (ref 20–29)
Calcium: 9.6 mg/dL (ref 8.6–10.2)
Chloride: 99 mmol/L (ref 96–106)
Creatinine, Ser: 0.95 mg/dL (ref 0.76–1.27)
GFR calc Af Amer: 93 mL/min/{1.73_m2} (ref >59)
GFR calc non Af Amer: 80 mL/min/{1.73_m2} (ref >59)
Globulin, Total: 3.4 g/dL (ref 1.5–4.5)
Glucose: 170 mg/dL — ABNORMAL HIGH (ref 65–99)
Potassium: 4.9 mmol/L (ref 3.5–5.2)
Sodium: 138 mmol/L (ref 134–144)
Total Protein: 7.6 g/dL (ref 6.0–8.5)

## 2020-08-06 LAB — CBC WITH DIFFERENTIAL/PLATELET
Basophils Absolute: 0.1 10*3/uL (ref 0.0–0.2)
Basos: 1 %
EOS (ABSOLUTE): 0.3 10*3/uL (ref 0.0–0.4)
Eos: 4 %
Hematocrit: 42.2 % (ref 37.5–51.0)
Hemoglobin: 13.8 g/dL (ref 13.0–17.7)
Immature Grans (Abs): 0 10*3/uL (ref 0.0–0.1)
Immature Granulocytes: 0 %
Lymphocytes Absolute: 1.2 10*3/uL (ref 0.7–3.1)
Lymphs: 16 %
MCH: 26.7 pg (ref 26.6–33.0)
MCHC: 32.7 g/dL (ref 31.5–35.7)
MCV: 82 fL (ref 79–97)
Monocytes Absolute: 0.6 10*3/uL (ref 0.1–0.9)
Monocytes: 8 %
Neutrophils Absolute: 5.1 10*3/uL (ref 1.4–7.0)
Neutrophils: 71 %
Platelets: 207 10*3/uL (ref 150–450)
RBC: 5.16 x10E6/uL (ref 4.14–5.80)
RDW: 14 % (ref 11.6–15.4)
WBC: 7.2 10*3/uL (ref 3.4–10.8)

## 2020-08-06 LAB — MAGNESIUM: Magnesium: 1.8 mg/dL (ref 1.6–2.3)

## 2020-08-06 LAB — LIPID PANEL
Chol/HDL Ratio: 5 ratio (ref 0.0–5.0)
Cholesterol, Total: 130 mg/dL (ref 100–199)
HDL: 26 mg/dL — ABNORMAL LOW (ref >39)
LDL Chol Calc (NIH): 69 mg/dL (ref 0–99)
Triglycerides: 212 mg/dL — ABNORMAL HIGH (ref 0–149)
VLDL Cholesterol Cal: 35 mg/dL (ref 5–40)

## 2020-08-06 LAB — HEMOGLOBIN A1C
Est. average glucose Bld gHb Est-mCnc: 174 mg/dL
Hgb A1c MFr Bld: 7.7 % — ABNORMAL HIGH (ref 4.8–5.6)

## 2020-08-06 LAB — TSH: TSH: 3.23 u[IU]/mL (ref 0.450–4.500)

## 2020-08-06 NOTE — Telephone Encounter (Signed)
Written by Margaretann Loveless, PA-C on 08/06/2020 10:04 AM EDT View Full Comments Seen by patient Billy Wise on 08/06/2020 1:42 PM

## 2020-08-06 NOTE — Telephone Encounter (Signed)
-----   Message from Margaretann Loveless, [place] sent at 08/06/2020 10:04 AM EDT ----- Blood count is normal. Kidney and liver function are normal. Sodium, potassium, and calcium are normal. Cholesterol has improved compared to last check. A1c has worsened. It had been 6.7 at last check and has increased to 7.7. I know you had to have a steroid back in June so that could have affected it some. I am not sure if you want [place] to send this result to your endocrinologist to see if they want to make medication adjustments. Thyroid is normal. Magnesium is in normal range, but is low normal. I would recommend to try magnesium sulfate 250mg  at bedtime for 1-2 weeks to see if that helps the night time cramps.

## 2020-08-07 NOTE — Telephone Encounter (Signed)
Please abstract covid information. Card attached to [employer]

## 2020-08-09 NOTE — Telephone Encounter (Signed)
Abstraction done 

## 2020-08-13 ENCOUNTER — Other Ambulatory Visit: Payer: Medicare PPO

## 2020-08-14 ENCOUNTER — Ambulatory Visit: Payer: Medicare PPO

## 2020-08-16 ENCOUNTER — Encounter: Payer: Medicare PPO | Admitting: Family Medicine

## 2020-08-19 ENCOUNTER — Telehealth: Payer: Self-pay

## 2020-08-19 DIAGNOSIS — M62838 Other muscle spasm: Secondary | ICD-10-CM

## 2020-08-19 MED ORDER — CYCLOBENZAPRINE HCL 5 MG PO TABS: 5.0000 mg | ORAL_TABLET | Freq: Every day | ORAL | 1 refills | Status: DC

## 2020-08-19 NOTE — Telephone Encounter (Signed)
Will send in Flexeril.

## 2020-08-19 NOTE — Telephone Encounter (Signed)
Copied from CRM [id number]. Topic: General - Other >> Aug 19, 2020 11:21 AM Dalphine Handing A wrote: Pateint was advised by Rosezetta Schlatter to callback if magneisum didn't work , so that [employer] could call in muscle relaxers. Patient didn't have a good weekend and would like the muscle relaxers called in to to pharmacy and a callback once medication has been sent there. [id number] - [place], [place] - [address] AT [facility] [address]  Phone:  [phone] Fax:  [phone]

## 2020-08-19 NOTE — Telephone Encounter (Signed)
Patient's wife advised

## 2020-08-19 NOTE — Addendum Note (Signed)
Addended by: Margaretann Loveless on: 08/19/2020 05:22 PM   Modules accepted: Orders

## 2020-08-26 ENCOUNTER — Telehealth: Payer: Self-pay | Admitting: Pulmonary Disease

## 2020-08-26 DIAGNOSIS — J418 Mixed simple and mucopurulent chronic bronchitis: Secondary | ICD-10-CM

## 2020-08-26 MED ORDER — BREZTRI AEROSPHERE 160-9-4.8 MCG/ACT IN AERO: 2.0000 | INHALATION_SPRAY | Freq: Two times a day (BID) | RESPIRATORY_TRACT | 1 refills | Status: DC

## 2020-08-26 NOTE — Telephone Encounter (Signed)
90 day supply of Billy Wise has been sent to preferred pharmacy.  Patient is aware and voiced her understanding.  Nothing further needed.

## 2020-09-03 ENCOUNTER — Telehealth: Payer: Self-pay | Admitting: Pulmonary Disease

## 2020-09-03 DIAGNOSIS — J441 Chronic obstructive pulmonary disease with (acute) exacerbation: Secondary | ICD-10-CM | POA: Diagnosis not present

## 2020-09-03 NOTE — Telephone Encounter (Signed)
I have spoken to Billy Wise with [facility], who is requesting order for POC to be faxed to [phone].   Dr. Jayme Cloud, please advise if okay to order? Thanks   Fax number [phone]

## 2020-09-03 NOTE — Telephone Encounter (Signed)
Lm for Will [facility].

## 2020-09-03 NOTE — Telephone Encounter (Signed)
Patient is aware that we are working

## 2020-09-03 NOTE — Telephone Encounter (Signed)
ATC provided number x2 and received recording that number is not in service.   I have spoken to patient, who stated that he is in the process of purchasing a POC from [facility]. Patient provided me with a contact number of [phone].   I have left message for Will with [facility].

## 2020-09-03 NOTE — Telephone Encounter (Signed)
Can send prescription for 2 L/min with exertion.

## 2020-09-03 NOTE — Telephone Encounter (Signed)
Will from [facility] returning missed call. [phone]

## 2020-09-03 NOTE — Telephone Encounter (Signed)
Order has been faxed to [facility]. Patient is aware and voiced his understanding.  Nothing further needed.

## 2020-09-03 NOTE — Telephone Encounter (Signed)
Pt states he is returning a missed call.[phone]

## 2020-09-17 ENCOUNTER — Encounter: Payer: Self-pay | Admitting: Family Medicine

## 2020-09-17 DIAGNOSIS — Z794 Long term (current) use of insulin: Secondary | ICD-10-CM | POA: Diagnosis not present

## 2020-09-17 DIAGNOSIS — R202 Paresthesia of skin: Secondary | ICD-10-CM | POA: Diagnosis not present

## 2020-09-17 DIAGNOSIS — I10 Essential (primary) hypertension: Secondary | ICD-10-CM | POA: Diagnosis not present

## 2020-09-17 DIAGNOSIS — E785 Hyperlipidemia, unspecified: Secondary | ICD-10-CM | POA: Diagnosis not present

## 2020-09-17 DIAGNOSIS — E1121 Type 2 diabetes mellitus with diabetic nephropathy: Secondary | ICD-10-CM | POA: Diagnosis not present

## 2020-09-17 DIAGNOSIS — J449 Chronic obstructive pulmonary disease, unspecified: Secondary | ICD-10-CM | POA: Diagnosis not present

## 2020-09-17 DIAGNOSIS — E65 Localized adiposity: Secondary | ICD-10-CM | POA: Diagnosis not present

## 2020-09-17 DIAGNOSIS — Z8249 Family history of ischemic heart disease and other diseases of the circulatory system: Secondary | ICD-10-CM | POA: Diagnosis not present

## 2020-09-17 DIAGNOSIS — R200 Anesthesia of skin: Secondary | ICD-10-CM | POA: Diagnosis not present

## 2020-09-17 DIAGNOSIS — M62838 Other muscle spasm: Secondary | ICD-10-CM

## 2020-09-17 DIAGNOSIS — Z8669 Personal history of other diseases of the nervous system and sense organs: Secondary | ICD-10-CM | POA: Diagnosis not present

## 2020-09-18 ENCOUNTER — Other Ambulatory Visit: Payer: Self-pay

## 2020-09-18 ENCOUNTER — Ambulatory Visit: Payer: Medicare PPO | Admitting: Pulmonary Disease

## 2020-09-18 ENCOUNTER — Encounter: Payer: Self-pay | Admitting: Pulmonary Disease

## 2020-09-18 VITALS — BP 120/66 | HR 103 | Temp 97.0°F | Ht 70.0 in | Wt 186.8 lb

## 2020-09-18 DIAGNOSIS — J849 Interstitial pulmonary disease, unspecified: Secondary | ICD-10-CM

## 2020-09-18 DIAGNOSIS — J9611 Chronic respiratory failure with hypoxia: Secondary | ICD-10-CM | POA: Diagnosis not present

## 2020-09-18 DIAGNOSIS — J449 Chronic obstructive pulmonary disease, unspecified: Secondary | ICD-10-CM

## 2020-09-18 DIAGNOSIS — I272 Pulmonary hypertension, unspecified: Secondary | ICD-10-CM | POA: Diagnosis not present

## 2020-09-18 DIAGNOSIS — E65 Localized adiposity: Secondary | ICD-10-CM | POA: Diagnosis not present

## 2020-09-18 DIAGNOSIS — I2729 Other secondary pulmonary hypertension: Secondary | ICD-10-CM | POA: Diagnosis not present

## 2020-09-18 DIAGNOSIS — I5189 Other ill-defined heart diseases: Secondary | ICD-10-CM | POA: Diagnosis not present

## 2020-09-18 NOTE — Patient Instructions (Signed)
Continue medications as you are doing  Rinse well after using Breztri  Continue oxygen use at 2 L/min  We will see you in follow-up in 6 months time call sooner should any difficulties arise

## 2020-09-18 NOTE — Progress Notes (Signed)
Subjective:    Patient ID: Billy Wise, male    DOB: 1946-07-10, 74 y.o.   MRN: [id number]  HPI 74 year old former smoker, follows from his site with me on 19 July 2020.  He has moderate basis of emphysema with associated fibrosis related to smoking.  Prior CT scan of the chest was not compatible with IPF.  At his prior visit he was noted to have increasing dyspnea the oximetry showed desaturations with exercise he was placed on 2 L/min during ambulation.  Since that time his dyspnea is markedly improved.  He is now able to continue his exercise program at home with supplemental oxygen.  He is also on Breztri 2 puffs twice a day and feels that this is a good substitution for his Symbicort and Spiriva.  He is compliant with the medication.  He voices no active complaint today he overall looks well and feels well.  We discussed in detail the results of the echocardiogram and 2D echo and he understands need for ongoing oxygen supplementation particularly in view of developing pulmonary hypertension.   DATA:  CBC 10/28/17>>eosinophils equals 0.  Sleep Study 11/27/17>>no sleep apnea.  Nocturnal desaturations noted.  Desat walk 11/15/17>>baseline sat on RA at rest was 91% and HR 89. Walked 360 feet at a very brisk pace, was conversational. Sat was 88% and HR 112. Moderate dyspnea.  CT chest personally reviewed>>CT hi-res 02/01/18; chest x-ray 10/28/17; there is mild basilar fibrosis, bronchiectatic changes with bronchial thickening, severe apical emphysema. Findings not likely due to UIP.   PFT tracings personally reviewed from 02/01/18>>FVC is 86% predicted, FEV1 is 67% predicted, there is no improvement with bronchodilator. Ratio 61%.DLCO is reduced at 52%.Flow volume loop is obstructive.  Ambulatory oximetry 07/19/2020: Desaturations with exercise, initiated oxygen at 2 L/min  2D echocardiogram 07/26/2020: LVEF 60 to 65%, grade 1 DD, moderate elevation on pulmonary artery systolic  pressure.  PFTs 07/31/2020: FEV1 1.57 L or 57% predicted, FVC 2.79 L or 74% predicted.  FEV1/FVC 56%.  Postbronchodilator there is a 10% change in FEV1 lung volumes showed mild air trapping no hyperinflation, diffusion capacity moderately reduced.  Consistent with COPD emphysema.  Flow volume loop is delayed consistent with obstructive lung disease.   Review of Systems A 10 point review of systems was performed and it is as noted above otherwise negative.  Patient Active Problem List   Diagnosis Date Noted  . Other secondary pulmonary hypertension ([place]) 07/26/2020  . Diastolic dysfunction 09/98/3382  . Obesity 10/25/2019  . History of anemia 04/20/2019  . Gastroesophageal reflux disease without esophagitis 09/09/2018  . Mixed hyperlipidemia 09/09/2018  . T2DM (type 2 diabetes mellitus) ([place]) 10/28/2017  . COPD (chronic obstructive pulmonary disease) ([place]) 10/28/2017  . HTN (hypertension) 10/28/2017  . Insomnia 09/03/2016  . BPH with obstruction/lower urinary tract symptoms 12/16/2015   No Known Allergies  Current Meds  Medication Sig  . Accu-Chek Softclix Lancets lancets Use to check blood sugar twice a day. DX E11.9  . acidophilus (RISAQUAD) CAPS capsule Take 1 capsule by mouth daily.  Marland Kitchen amLODipine (NORVASC) 10 MG tablet Take 1 tablet (10 mg total) by mouth daily.  Marland Kitchen ascorbic acid (VITAMIN C) 1000 MG tablet Take 1 tablet by mouth daily.  Marland Kitchen aspirin EC 81 MG tablet Take 81 mg by mouth daily.  . Blood Glucose Monitoring Suppl (ACCU-CHEK AVIVA PLUS) w/Device KIT Use to check blood sugar twice a day.  DX E11.9  . Budeson-Glycopyrrol-Formoterol (BREZTRI AEROSPHERE) 160-9-4.8 MCG/ACT AERO Inhale 2 puffs  into the lungs in the morning and at bedtime.  . cyclobenzaprine (FLEXERIL) 5 MG tablet Take 1 tablet (5 mg total) by mouth at bedtime.  . Dulaglutide (TRULICITY) 1.5 MB/8.4YK SOPN Inject 1.5 mg into the skin once a week.  . finasteride (PROSCAR) 5 MG tablet Take 1 tablet (5 mg total) by  mouth daily.  Marland Kitchen glimepiride (AMARYL) 4 MG tablet TAKE 1 TABLET(4 MG) BY MOUTH TWICE DAILY  . glucose blood (ACCU-CHEK AVIVA PLUS) test strip Use to check blood sugar twice a day. DX: E11.9  . Insulin Pen Needle (PEN NEEDLES) 31G X 5 MM MISC 1 each by Does not apply route daily as needed.  Marland Kitchen ipratropium-albuterol (DUONEB) 0.5-2.5 (3) MG/3ML SOLN Take 3 mLs by nebulization every 6 (six) hours.  . Lactobacillus (PROBIOTIC ACIDOPHILUS PO) Take 1 capsule by mouth daily.   Marland Kitchen LEVEMIR FLEXTOUCH 100 UNIT/ML FlexPen Inject 80 Units into the skin at bedtime.  . metFORMIN (GLUCOPHAGE) 1000 MG tablet Take 1 tablet (1,000 mg total) by mouth 2 (two) times daily.  Marland Kitchen omeprazole (PRILOSEC) 20 MG capsule Take 1 capsule (20 mg total) by mouth daily.  Marland Kitchen oxybutynin (DITROPAN-XL) 10 MG 24 hr tablet Take 1 tablet (10 mg total) by mouth daily.  . polycarbophil (FIBERCON) 625 MG tablet Take 625 mg by mouth every evening.  . Potassium 99 MG TABS Take by mouth daily.   Marland Kitchen PROAIR HFA 108 (90 Base) MCG/ACT inhaler Inhale 2 puffs into the lungs every 4 (four) hours as needed for wheezing.  . quinapril (ACCUPRIL) 40 MG tablet Take 1 tablet (40 mg total) by mouth daily.  . simvastatin (ZOCOR) 20 MG tablet Take 1 tablet (20 mg total) by mouth every evening.  . tamsulosin (FLOMAX) 0.4 MG CAPS capsule Take 1 capsule (0.4 mg total) by mouth at bedtime.  . vitamin B-12 (CYANOCOBALAMIN) 1000 MCG tablet Take 1,000 mcg by mouth daily.  Marland Kitchen zolpidem (AMBIEN) 10 MG tablet TAKE 1 TABLET(10 MG) BY MOUTH DAILY AS NEEDED FOR SLEEP   Immunization History  Administered Date(s) Administered  . Fluad Quad(high Dose 65+) 08/05/2020  . Influenza, High Dose Seasonal PF 07/24/2015, 07/21/2016, 08/02/2017, 08/06/2017, 07/27/2018  . Influenza,inj,Quad PF,6+ Mos 06/27/2019  . Influenza-Unspecified 09/14/2012  . PFIZER SARS-COV-2 Vaccination 11/10/2019, 12/01/2019, 07/14/2020  . Pneumococcal Conjugate-13 08/25/2014  . Pneumococcal Polysaccharide-23  01/26/2016  . Pneumococcal-Unspecified 12/17/2005  . Tdap 12/17/2005, 02/25/2016  . Zoster 11/24/2013  . Zoster Recombinat (Shingrix) 05/16/2019       Objective:   Physical Exam BP 120/66 (BP Location: Right Arm, Cuff Size: Normal)   Pulse (!) 103   Temp (!) 97 F (36.1 C) (Temporal)   Ht '5\' 10"'  (1.778 m)   Wt 186 lb 12.8 oz (84.7 kg)   SpO2 90%   BMI 26.80 kg/m  GENERAL: Awake, alert, no respiratory distress.  Fully ambulatory. HEAD: Normocephalic, atraumatic.  EYES: Pupils equal, round, reactive to light.  No scleral icterus.  MOUTH: Nose/mouth/throat not examined due to masking requirements for COVID 19. NECK: Supple. No thyromegaly. Trachea midline. No JVD.  No adenopathy. PULMONARY: Good air entry bilaterally.  Coarse, dry crackles at bases, no other adventitious sounds. CARDIOVASCULAR: S1 and S2. Regular rate and rhythm.  No rubs, murmurs or gallops heard. ABDOMEN: Protuberant, truncal obesity, otherwise benign. MUSCULOSKELETAL: No joint deformity, no clubbing, trace edema.  NEUROLOGIC: No deficits, no gait disturbance, speech is fluent. SKIN: Intact,warm,dry.  Stasis changes lower extremities. PSYCH: Mood and behavior normal.     Assessment & Plan:  ICD-10-CM   1. Moderate COPD (chronic obstructive pulmonary disease) (HCC)  J44.9    Continue Breztri 2 puffs twice a day with as needed albuterol Rinse mouth well after use of Breztri  2. Interstitial pulmonary disease (HCC)  J84.9    Smoking related fibrosis Prior high-resolution CT negative for IPF  3. Chronic respiratory failure with hypoxia (HCC)  J96.11    Continue oxygen at 2 L/min  4. Pulmonary hypertension (HCC)  I27.20    Related to hypoxic vasoconstriction Continue oxygen supplementation.  5. Truncal obesity  E65    This issue adds complexity to his management Will add an element of restrictive physiology to PFTs  6. Other secondary pulmonary hypertension (HCC)  I27.29   7. Diastolic dysfunction   E45.40    Discussion:  Patient is doing well with oxygen supplementation and changes made to his medications to simplify his COPD regimen.  It is imperative that he continue oxygen supplementation as is.  He is compliant with oxygen therapy and feels benefit of the same.  Weight loss will also help as he has significant truncal obesity and this will cause restriction with regards to his lung function.  We will see the patient in follow-up in 6 months time he is to contact us prior to that time should any new difficulties arise.  Renold Don, MD [facility]   *This note was dictated using voice recognition software/Dragon.  Despite best efforts to proofread, errors can occur which can change the meaning.  Any change was purely unintentional.

## 2020-09-23 MED ORDER — CYCLOBENZAPRINE HCL 5 MG PO TABS: 5.0000 mg | ORAL_TABLET | Freq: Every day | ORAL | 1 refills | Status: DC

## 2020-09-23 NOTE — Telephone Encounter (Signed)
Yes please go ahead and send #[phone] to local pharmacy. Thanks!

## 2020-09-29 ENCOUNTER — Other Ambulatory Visit: Payer: Self-pay | Admitting: Family Medicine

## 2020-10-03 DIAGNOSIS — J441 Chronic obstructive pulmonary disease with (acute) exacerbation: Secondary | ICD-10-CM | POA: Diagnosis not present

## 2020-11-03 DIAGNOSIS — J441 Chronic obstructive pulmonary disease with (acute) exacerbation: Secondary | ICD-10-CM | POA: Diagnosis not present

## 2020-11-06 DIAGNOSIS — Z794 Long term (current) use of insulin: Secondary | ICD-10-CM | POA: Diagnosis not present

## 2020-11-06 DIAGNOSIS — E1165 Type 2 diabetes mellitus with hyperglycemia: Secondary | ICD-10-CM | POA: Diagnosis not present

## 2020-11-06 DIAGNOSIS — N181 Chronic kidney disease, stage 1: Secondary | ICD-10-CM | POA: Diagnosis not present

## 2020-11-06 DIAGNOSIS — E1122 Type 2 diabetes mellitus with diabetic chronic kidney disease: Secondary | ICD-10-CM | POA: Diagnosis not present

## 2020-11-12 ENCOUNTER — Other Ambulatory Visit: Payer: Self-pay

## 2020-11-12 DIAGNOSIS — E1169 Type 2 diabetes mellitus with other specified complication: Secondary | ICD-10-CM

## 2020-11-12 DIAGNOSIS — Z794 Long term (current) use of insulin: Secondary | ICD-10-CM

## 2020-11-12 MED ORDER — ACCU-CHEK SOFTCLIX LANCETS MISC: 2 refills | Status: AC

## 2020-11-12 MED ORDER — ACCU-CHEK AVIVA PLUS VI STRP: ORAL_STRIP | 2 refills | Status: DC

## 2020-11-15 ENCOUNTER — Other Ambulatory Visit: Payer: Medicare PPO

## 2020-11-15 DIAGNOSIS — Z20822 Contact with and (suspected) exposure to covid-19: Secondary | ICD-10-CM

## 2020-11-17 LAB — SARS-COV-2, NAA 2 DAY TAT

## 2020-11-17 LAB — NOVEL CORONAVIRUS, NAA: SARS-CoV-2, NAA: NOT DETECTED

## 2020-11-21 DIAGNOSIS — Z794 Long term (current) use of insulin: Secondary | ICD-10-CM | POA: Diagnosis not present

## 2020-11-21 DIAGNOSIS — E1121 Type 2 diabetes mellitus with diabetic nephropathy: Secondary | ICD-10-CM | POA: Diagnosis not present

## 2020-11-28 DIAGNOSIS — Z794 Long term (current) use of insulin: Secondary | ICD-10-CM | POA: Diagnosis not present

## 2020-11-28 DIAGNOSIS — Z8669 Personal history of other diseases of the nervous system and sense organs: Secondary | ICD-10-CM | POA: Diagnosis not present

## 2020-11-28 DIAGNOSIS — I10 Essential (primary) hypertension: Secondary | ICD-10-CM | POA: Diagnosis not present

## 2020-11-28 DIAGNOSIS — Z8249 Family history of ischemic heart disease and other diseases of the circulatory system: Secondary | ICD-10-CM | POA: Diagnosis not present

## 2020-11-28 DIAGNOSIS — E1121 Type 2 diabetes mellitus with diabetic nephropathy: Secondary | ICD-10-CM | POA: Diagnosis not present

## 2020-11-28 DIAGNOSIS — J449 Chronic obstructive pulmonary disease, unspecified: Secondary | ICD-10-CM | POA: Diagnosis not present

## 2020-11-28 DIAGNOSIS — E65 Localized adiposity: Secondary | ICD-10-CM | POA: Diagnosis not present

## 2020-11-28 DIAGNOSIS — E785 Hyperlipidemia, unspecified: Secondary | ICD-10-CM | POA: Diagnosis not present

## 2020-12-04 DIAGNOSIS — J441 Chronic obstructive pulmonary disease with (acute) exacerbation: Secondary | ICD-10-CM | POA: Diagnosis not present

## 2020-12-11 DIAGNOSIS — L821 Other seborrheic keratosis: Secondary | ICD-10-CM | POA: Diagnosis not present

## 2020-12-11 DIAGNOSIS — D229 Melanocytic nevi, unspecified: Secondary | ICD-10-CM | POA: Diagnosis not present

## 2020-12-11 DIAGNOSIS — Z85828 Personal history of other malignant neoplasm of skin: Secondary | ICD-10-CM | POA: Diagnosis not present

## 2020-12-11 DIAGNOSIS — I10 Essential (primary) hypertension: Secondary | ICD-10-CM | POA: Diagnosis not present

## 2020-12-11 DIAGNOSIS — L814 Other melanin hyperpigmentation: Secondary | ICD-10-CM | POA: Diagnosis not present

## 2020-12-11 DIAGNOSIS — Z872 Personal history of diseases of the skin and subcutaneous tissue: Secondary | ICD-10-CM | POA: Diagnosis not present

## 2020-12-11 DIAGNOSIS — L570 Actinic keratosis: Secondary | ICD-10-CM | POA: Diagnosis not present

## 2020-12-15 ENCOUNTER — Encounter: Payer: Self-pay | Admitting: Family Medicine

## 2020-12-16 ENCOUNTER — Other Ambulatory Visit: Payer: Self-pay | Admitting: Family Medicine

## 2020-12-16 ENCOUNTER — Other Ambulatory Visit: Payer: Self-pay

## 2020-12-16 ENCOUNTER — Telehealth: Payer: Self-pay | Admitting: Family Medicine

## 2020-12-16 DIAGNOSIS — E1169 Type 2 diabetes mellitus with other specified complication: Secondary | ICD-10-CM

## 2020-12-16 DIAGNOSIS — Z794 Long term (current) use of insulin: Secondary | ICD-10-CM

## 2020-12-16 MED ORDER — ACCU-CHEK GUIDE VI STRP: ORAL_STRIP | 12 refills | Status: AC

## 2020-12-16 MED ORDER — ACCU-CHEK AVIVA PLUS VI STRP: ORAL_STRIP | 2 refills | Status: DC

## 2020-12-16 NOTE — Telephone Encounter (Signed)
Patient will need new Rx for test strips- Accu-Chek Guide Me

## 2020-12-16 NOTE — Telephone Encounter (Signed)
Medication Refill - Medication: Accucheck Guide Me test strips  Has the patient contacted their pharmacy? Yes.   Luther Parody, from [facility], calling on behalf of pt stating that the pharmacy does not have this prescription on file and the pt is completely out. Please advise.  (Agent: If no, request that the patient contact the pharmacy for the refill.) (Agent: If yes, when and what did the pharmacy advise?)  Preferred Pharmacy (with phone number or street name):  [id number] - [place], [place] - [address] AT [facility] [address] [facility] [address]  [address] [place] [address]  Phone: [phone] Fax: [phone]  Hours: Not open 24 hours     Agent: Please be advised that RX refills may take up to 3 business days. We ask that you follow-up with your pharmacy.

## 2020-12-16 NOTE — Progress Notes (Signed)
accu

## 2020-12-16 NOTE — Telephone Encounter (Signed)
Pt stated the wrong test strips were sent in.   Pt needs "accu chek guide me test strips"  Pharmacy: [id number] [place], [place] - [address] AT [facility] [address]  [address] [place], [place] [address]  Phone:  [phone] Fax:  [phone]

## 2020-12-24 DIAGNOSIS — Z794 Long term (current) use of insulin: Secondary | ICD-10-CM | POA: Diagnosis not present

## 2020-12-24 DIAGNOSIS — E1121 Type 2 diabetes mellitus with diabetic nephropathy: Secondary | ICD-10-CM | POA: Diagnosis not present

## 2020-12-30 ENCOUNTER — Encounter: Payer: Self-pay | Admitting: Family Medicine

## 2021-01-01 DIAGNOSIS — J441 Chronic obstructive pulmonary disease with (acute) exacerbation: Secondary | ICD-10-CM | POA: Diagnosis not present

## 2021-01-01 NOTE — Telephone Encounter (Signed)
Recommend calling or following up with Endocrine about blood sugars. Definitely needs to be seen here for blood pressures. Bring home BP cuff with him to appt so we can compare.

## 2021-01-03 ENCOUNTER — Ambulatory Visit: Payer: Medicare PPO | Admitting: Physician Assistant

## 2021-01-03 NOTE — Progress Notes (Signed)
Established patient visit   Patient: Billy Wise   DOB: 1946-04-28   75 y.o. Male  MRN: [id number] Visit Date: 01/06/2021  Today's healthcare provider: Lavon Paganini, MD   Chief Complaint  Patient presents with  . Hypertension  I,Angela Bacigalupo,acting as a scribe for Lavon Paganini, MD.,have documented all relevant documentation on the behalf of Lavon Paganini, MD,as directed by  Lavon Paganini, MD while in the presence of Lavon Paganini, MD.  Subjective    HPI  Hypertension, follow-up  BP Readings from Last 3 Encounters:  01/06/21 137/77  09/18/20 120/66  08/05/20 (!) 131/91   Wt Readings from Last 3 Encounters:  01/06/21 185 lb 11.2 oz (84.2 kg)  09/18/20 186 lb 12.8 oz (84.7 kg)  08/05/20 189 lb 12.8 oz (86.1 kg)     He was last seen for hypertension 5 months ago.  BP at that visit was 131/91. Management since that visit includes no changes.  He reports good compliance with treatment. He is not having side effects.  He is following a Regular diet. He is not exercising. He does not smoke.  Use of agents associated with hypertension: none.   Outside blood pressures are arranging around 150's/70's-190's/90's. Seems that home cuff is running elevated and not accurate.  Feels worse when position changing.   Symptoms: Yes chest pain No chest pressure  No palpitations No syncope  No dyspnea No orthopnea  No paroxysmal nocturnal dyspnea No lower extremity edema   Pertinent labs: Lab Results  Component Value Date   CHOL 130 08/05/2020   HDL 26 (L) 08/05/2020   LDLCALC 69 08/05/2020   TRIG 212 (H) 08/05/2020   CHOLHDL 5.0 08/05/2020   Lab Results  Component Value Date   NA 138 08/05/2020   K 4.9 08/05/2020   CREATININE 0.95 08/05/2020   GFRNONAA 80 08/05/2020   GFRAA 93 08/05/2020   GLUCOSE 170 (H) 08/05/2020     The 10-year ASCVD risk score Mikey Bussing DC Jr., et al., 2013) is: 47.2%    --------------------------------------------------------------------------------------------------- Orthostatic VS for the past 72 hrs (Last 3 readings):  Orthostatic BP Patient Position BP Location Cuff Size Orthostatic Pulse  01/06/21 0830 109/65 Standing Left Arm Normal 77  01/06/21 0827 110/69 Sitting Left Arm Normal 90  01/06/21 0824 126/71 Supine Left Arm Normal 88     Patient Active Problem List   Diagnosis Date Noted  . Chest pain 01/06/2021  . Orthostatic hypotension 01/06/2021  . Other secondary pulmonary hypertension ([place]) 07/26/2020  . Diastolic dysfunction 43/15/4008  . Obesity 10/25/2019  . History of anemia 04/20/2019  . Gastroesophageal reflux disease without esophagitis 09/09/2018  . Mixed hyperlipidemia 09/09/2018  . T2DM (type 2 diabetes mellitus) ([place]) 10/28/2017  . COPD (chronic obstructive pulmonary disease) ([place]) 10/28/2017  . HTN (hypertension) 10/28/2017  . Insomnia 09/03/2016  . BPH with obstruction/lower urinary tract symptoms 12/16/2015   Social History   Tobacco Use  . Smoking status: Former Smoker    Packs/day: 1.50    Years: 50.00    Pack years: 75.00    Types: Cigarettes    Quit date: 09/29/2008    Years since quitting: 12.2  . Smokeless tobacco: Never Used  Vaping Use  . Vaping Use: Never used  Substance Use Topics  . Alcohol use: Yes    Comment: 2 drinks a month / cocktail  . Drug use: No   No Known Allergies     Medications: Outpatient Medications Prior to Visit  Medication Sig  .  Accu-Chek Softclix Lancets lancets Use to check blood sugar twice a day. DX E11.9  . acidophilus (RISAQUAD) CAPS capsule Take 1 capsule by mouth daily.  Marland Kitchen ascorbic acid (VITAMIN C) 1000 MG tablet Take 1 tablet by mouth daily.  Marland Kitchen aspirin EC 81 MG tablet Take 81 mg by mouth daily.  . Blood Glucose Monitoring Suppl (ACCU-CHEK AVIVA PLUS) w/Device KIT Use to check blood sugar twice a day.  DX E11.9  . Budeson-Glycopyrrol-Formoterol (BREZTRI AEROSPHERE)  160-9-4.8 MCG/ACT AERO Inhale 2 puffs into the lungs in the morning and at bedtime.  . cyclobenzaprine (FLEXERIL) 5 MG tablet Take 1 tablet (5 mg total) by mouth at bedtime.  . Dulaglutide (TRULICITY) 1.5 VQ/2.5ZD SOPN Inject 1.5 mg into the skin once a week.  . finasteride (PROSCAR) 5 MG tablet Take 1 tablet (5 mg total) by mouth daily.  Marland Kitchen glimepiride (AMARYL) 4 MG tablet TAKE 1 TABLET(4 MG) BY MOUTH TWICE DAILY  . glucose blood (ACCU-CHEK GUIDE) test strip Use as instructed  . Insulin Pen Needle (PEN NEEDLES) 31G X 5 MM MISC 1 each by Does not apply route daily as needed.  Marland Kitchen ipratropium-albuterol (DUONEB) 0.5-2.5 (3) MG/3ML SOLN Take 3 mLs by nebulization every 6 (six) hours.  . Lactobacillus (PROBIOTIC ACIDOPHILUS PO) Take 1 capsule by mouth daily.   Marland Kitchen LEVEMIR FLEXTOUCH 100 UNIT/ML FlexPen Inject 80 Units into the skin at bedtime.  . metFORMIN (GLUCOPHAGE) 1000 MG tablet Take 1 tablet (1,000 mg total) by mouth 2 (two) times daily.  Marland Kitchen omeprazole (PRILOSEC) 20 MG capsule Take 1 capsule (20 mg total) by mouth daily.  Marland Kitchen oxybutynin (DITROPAN-XL) 10 MG 24 hr tablet Take 1 tablet (10 mg total) by mouth daily.  . polycarbophil (FIBERCON) 625 MG tablet Take 625 mg by mouth every evening.  . Potassium 99 MG TABS Take by mouth daily.   Marland Kitchen PROAIR HFA 108 (90 Base) MCG/ACT inhaler Inhale 2 puffs into the lungs every 4 (four) hours as needed for wheezing.  . quinapril (ACCUPRIL) 40 MG tablet Take 1 tablet (40 mg total) by mouth daily.  . simvastatin (ZOCOR) 20 MG tablet Take 1 tablet (20 mg total) by mouth every evening.  . tamsulosin (FLOMAX) 0.4 MG CAPS capsule Take 1 capsule (0.4 mg total) by mouth at bedtime.  . vitamin B-12 (CYANOCOBALAMIN) 1000 MCG tablet Take 1,000 mcg by mouth daily.  Marland Kitchen zolpidem (AMBIEN) 10 MG tablet TAKE 1 TABLET(10 MG) BY MOUTH DAILY AS NEEDED FOR SLEEP  . [DISCONTINUED] amLODipine (NORVASC) 10 MG tablet Take 1 tablet (10 mg total) by mouth daily.   No facility-administered  medications prior to visit.    Review of Systems  Constitutional: Positive for fatigue. Negative for appetite change and unexpected weight change.  Respiratory: Positive for chest tightness. Negative for cough and shortness of breath.   Cardiovascular: Positive for chest pain. Negative for palpitations.  Gastrointestinal: Negative for constipation, diarrhea, nausea, rectal pain and vomiting.  Neurological: Positive for dizziness and light-headedness.    Last thyroid functions Lab Results  Component Value Date   TSH 3.230 08/05/2020   Last vitamin B12 and Folate Lab Results  Component Value Date   VITAMINB12 1,933 09/19/2018        Objective    BP 137/77 (BP Location: Left Arm, Patient Position: Sitting, Cuff Size: Normal)   Pulse (!) 107   Temp 98.4 F (36.9 C) (Oral)   Wt 185 lb 11.2 oz (84.2 kg)   SpO2 96%   BMI 26.65 kg/m  BP  Readings from Last 3 Encounters:  01/06/21 137/77  09/18/20 120/66  08/05/20 (!) 131/91   Wt Readings from Last 3 Encounters:  01/06/21 185 lb 11.2 oz (84.2 kg)  09/18/20 186 lb 12.8 oz (84.7 kg)  08/05/20 189 lb 12.8 oz (86.1 kg)      Physical Exam Vitals reviewed.  Constitutional:      General: He is not in acute distress.    Appearance: Normal appearance. He is not diaphoretic.  HENT:     Head: Normocephalic and atraumatic.  Eyes:     General: No scleral icterus.    Conjunctiva/sclera: Conjunctivae normal.  Cardiovascular:     Rate and Rhythm: Normal rate and regular rhythm.     Pulses: Normal pulses.     Heart sounds: Normal heart sounds. No murmur heard.   Pulmonary:     Effort: Pulmonary effort is normal. No respiratory distress.     Breath sounds: Normal breath sounds. No wheezing or rhonchi.  Abdominal:     General: There is no distension.     Palpations: Abdomen is soft.     Tenderness: There is no abdominal tenderness.  Musculoskeletal:     Cervical back: Neck supple.     Right lower leg: No edema.     Left  lower leg: No edema.  Lymphadenopathy:     Cervical: No cervical adenopathy.  Skin:    General: Skin is warm and dry.     Capillary Refill: Capillary refill takes less than 2 seconds.     Findings: No rash.  Neurological:     Mental Status: He is alert and oriented to person, place, and time.     Cranial Nerves: No cranial nerve deficit.  Psychiatric:        Mood and Affect: Mood normal.        Behavior: Behavior normal.      EKG unchanged from previous  No results found for any visits on 01/06/21.  Assessment & Plan     Problem List Items Addressed This Visit      Cardiovascular and Mediastinum   HTN (hypertension)    See below for orthostatic hypotension Decrease amlodipine dose      Relevant Medications   amLODipine (NORVASC) 5 MG tablet   Orthostatic hypotension - Primary    Patient was concerned about high home BPs, but cuff seems inaccurate He has symptomatic orthostatic hypotension in our office, which explains his symptoms Decrease amlodipine to 64m daily Continue quinapril Get new home BP cuff and check against a manual BP (DIL is an RTherapist, sports F/u in 1 month to recheck or sooner as needed      Relevant Medications   amLODipine (NORVASC) 5 MG tablet     Other   Chest pain    Benign and non cardiac in nature (non-exertional, sharp in nature, related to eating) Continue GERD remedies EKG reviewed Return precautions discussed      Relevant Orders   EKG 12-Lead (Completed)       Return in about 4 weeks (around 02/03/2021) for BP f/u.      I, ALavon Paganini MD, have reviewed all documentation for this visit. The documentation on 01/06/21 for the exam, diagnosis, procedures, and orders are all accurate and complete.   Bacigalupo, ADionne Bucy MD, MPH [facility]

## 2021-01-03 NOTE — Patient Instructions (Addendum)

## 2021-01-06 ENCOUNTER — Other Ambulatory Visit: Payer: Self-pay

## 2021-01-06 ENCOUNTER — Encounter: Payer: Self-pay | Admitting: Family Medicine

## 2021-01-06 ENCOUNTER — Ambulatory Visit: Payer: Medicare PPO | Admitting: Family Medicine

## 2021-01-06 VITALS — BP 137/77 | HR 107 | Temp 98.4°F | Wt 185.7 lb

## 2021-01-06 DIAGNOSIS — I10 Essential (primary) hypertension: Secondary | ICD-10-CM | POA: Diagnosis not present

## 2021-01-06 DIAGNOSIS — I951 Orthostatic hypotension: Secondary | ICD-10-CM | POA: Diagnosis not present

## 2021-01-06 DIAGNOSIS — R079 Chest pain, unspecified: Secondary | ICD-10-CM | POA: Insufficient documentation

## 2021-01-06 MED ORDER — AMLODIPINE BESYLATE 5 MG PO TABS: 5.0000 mg | ORAL_TABLET | Freq: Every day | ORAL | 5 refills | Status: DC

## 2021-01-06 NOTE — Assessment & Plan Note (Signed)
See below for orthostatic hypotension Decrease amlodipine dose

## 2021-01-06 NOTE — Assessment & Plan Note (Signed)
Patient was concerned about high home BPs, but cuff seems inaccurate He has symptomatic orthostatic hypotension in our office, which explains his symptoms Decrease amlodipine to 5mg  daily Continue quinapril Get new home BP cuff and check against a manual BP (DIL is an ) F/u in 1 month to recheck or sooner as needed

## 2021-01-06 NOTE — Assessment & Plan Note (Signed)
Benign and non cardiac in nature (non-exertional, sharp in nature, related to eating) Continue GERD remedies EKG reviewed Return precautions discussed

## 2021-01-09 DIAGNOSIS — E119 Type 2 diabetes mellitus without complications: Secondary | ICD-10-CM | POA: Diagnosis not present

## 2021-01-09 LAB — HM DIABETES EYE EXAM

## 2021-01-13 DIAGNOSIS — J449 Chronic obstructive pulmonary disease, unspecified: Secondary | ICD-10-CM | POA: Diagnosis not present

## 2021-01-13 DIAGNOSIS — Z8249 Family history of ischemic heart disease and other diseases of the circulatory system: Secondary | ICD-10-CM | POA: Diagnosis not present

## 2021-01-13 DIAGNOSIS — Z794 Long term (current) use of insulin: Secondary | ICD-10-CM | POA: Diagnosis not present

## 2021-01-13 DIAGNOSIS — Z8669 Personal history of other diseases of the nervous system and sense organs: Secondary | ICD-10-CM | POA: Diagnosis not present

## 2021-01-13 DIAGNOSIS — E785 Hyperlipidemia, unspecified: Secondary | ICD-10-CM | POA: Diagnosis not present

## 2021-01-13 DIAGNOSIS — E65 Localized adiposity: Secondary | ICD-10-CM | POA: Diagnosis not present

## 2021-01-13 DIAGNOSIS — E1121 Type 2 diabetes mellitus with diabetic nephropathy: Secondary | ICD-10-CM | POA: Diagnosis not present

## 2021-01-13 DIAGNOSIS — I10 Essential (primary) hypertension: Secondary | ICD-10-CM | POA: Diagnosis not present

## 2021-01-16 ENCOUNTER — Encounter: Payer: Self-pay | Admitting: Family Medicine

## 2021-01-27 ENCOUNTER — Other Ambulatory Visit: Payer: Self-pay

## 2021-01-27 MED ORDER — ZOLPIDEM TARTRATE 10 MG PO TABS: ORAL_TABLET | ORAL | 0 refills | Status: DC

## 2021-01-27 NOTE — Telephone Encounter (Signed)
[facility] request for the following medications: ° °zolpidem (AMBIEN) 10 MG tablet  ° °Please advise. °

## 2021-02-01 DIAGNOSIS — J441 Chronic obstructive pulmonary disease with (acute) exacerbation: Secondary | ICD-10-CM | POA: Diagnosis not present

## 2021-02-05 ENCOUNTER — Ambulatory Visit: Payer: Self-pay | Admitting: Family Medicine

## 2021-02-06 ENCOUNTER — Ambulatory Visit: Payer: Self-pay | Admitting: Family Medicine

## 2021-03-03 DIAGNOSIS — J441 Chronic obstructive pulmonary disease with (acute) exacerbation: Secondary | ICD-10-CM | POA: Diagnosis not present

## 2021-03-10 ENCOUNTER — Other Ambulatory Visit: Payer: Self-pay | Admitting: Pulmonary Disease

## 2021-03-10 DIAGNOSIS — J418 Mixed simple and mucopurulent chronic bronchitis: Secondary | ICD-10-CM

## 2021-04-03 DIAGNOSIS — J441 Chronic obstructive pulmonary disease with (acute) exacerbation: Secondary | ICD-10-CM | POA: Diagnosis not present

## 2021-05-03 DIAGNOSIS — J441 Chronic obstructive pulmonary disease with (acute) exacerbation: Secondary | ICD-10-CM | POA: Diagnosis not present

## 2021-05-12 ENCOUNTER — Other Ambulatory Visit: Payer: Self-pay | Admitting: Family Medicine

## 2021-05-12 NOTE — Telephone Encounter (Signed)
Requested medications are due for refill today.  yes  Requested medications are on the active medications list.  yes  Last refill. 01/27/2021  Future visit scheduled.   yes  Notes to clinic.  Medication not delegated.

## 2021-05-13 ENCOUNTER — Other Ambulatory Visit: Payer: Self-pay | Admitting: Family Medicine

## 2021-05-13 NOTE — Telephone Encounter (Signed)
Requested Prescriptions  Pending Prescriptions Disp Refills  . tamsulosin (FLOMAX) 0.4 MG CAPS capsule [Pharmacy Med Name: TAMSULOSIN 0.4MG  CAPSULES] 90 capsule 1    Sig: TAKE 1 CAPSULE(0.4 MG) BY MOUTH AT BEDTIME     Urology: Alpha-Adrenergic Blocker Passed - 05/13/2021  3:30 AM      Passed - Last BP in normal range    BP Readings from Last 1 Encounters:  01/06/21 137/77         Passed - Valid encounter within last 12 months    Recent Outpatient Visits          4 months ago Orthostatic hypotension   [facility] [place], Marzella Schlein, [place]   9 months ago Annual physical exam   [facility] [place], Alessandra Bevels, [place]   1 year ago COPD exacerbation [facility])   [facility] Trey Sailors, [place]   1 year ago Essential hypertension   [facility] [place], Marzella Schlein, [place]   2 years ago Encounter for annual physical exam   [facility] Bacigalupo, Marzella Schlein, MD      Future Appointments            In 2 months Bacigalupo, Marzella Schlein, MD [facility], [facility]

## 2021-05-26 ENCOUNTER — Telehealth: Payer: Self-pay | Admitting: Family Medicine

## 2021-05-26 DIAGNOSIS — Z794 Long term (current) use of insulin: Secondary | ICD-10-CM | POA: Diagnosis not present

## 2021-05-26 DIAGNOSIS — Z8669 Personal history of other diseases of the nervous system and sense organs: Secondary | ICD-10-CM | POA: Diagnosis not present

## 2021-05-26 DIAGNOSIS — E1121 Type 2 diabetes mellitus with diabetic nephropathy: Secondary | ICD-10-CM | POA: Diagnosis not present

## 2021-05-26 DIAGNOSIS — Z8249 Family history of ischemic heart disease and other diseases of the circulatory system: Secondary | ICD-10-CM | POA: Diagnosis not present

## 2021-05-26 DIAGNOSIS — E65 Localized adiposity: Secondary | ICD-10-CM | POA: Diagnosis not present

## 2021-05-26 DIAGNOSIS — I10 Essential (primary) hypertension: Secondary | ICD-10-CM | POA: Diagnosis not present

## 2021-05-26 DIAGNOSIS — J449 Chronic obstructive pulmonary disease, unspecified: Secondary | ICD-10-CM | POA: Diagnosis not present

## 2021-05-26 DIAGNOSIS — E785 Hyperlipidemia, unspecified: Secondary | ICD-10-CM | POA: Diagnosis not present

## 2021-05-26 LAB — TSH: TSH: 3.04 (ref <=5.90)

## 2021-05-26 LAB — LIPID PANEL
Cholesterol: 116 (ref 0–200)
HDL: 25 — AB (ref 35–70)
LDL Cholesterol: 50
Triglycerides: 256 — AB (ref 40–160)

## 2021-05-26 LAB — HEMOGLOBIN A1C: Hemoglobin A1C: 7.3

## 2021-05-26 LAB — MICROALBUMIN, URINE: Microalb, Ur: 491.5

## 2021-05-26 NOTE — Telephone Encounter (Signed)
[facility] refill request for the following medications:   glimepiride (AMARYL) 4 MG tablet   Please advise.  

## 2021-05-28 ENCOUNTER — Other Ambulatory Visit: Payer: Self-pay

## 2021-05-28 MED ORDER — GLIMEPIRIDE 4 MG PO TABS: ORAL_TABLET | ORAL | 1 refills | Status: DC

## 2021-06-03 DIAGNOSIS — J441 Chronic obstructive pulmonary disease with (acute) exacerbation: Secondary | ICD-10-CM | POA: Diagnosis not present

## 2021-06-10 DIAGNOSIS — L570 Actinic keratosis: Secondary | ICD-10-CM | POA: Diagnosis not present

## 2021-06-10 DIAGNOSIS — L821 Other seborrheic keratosis: Secondary | ICD-10-CM | POA: Diagnosis not present

## 2021-06-10 DIAGNOSIS — L814 Other melanin hyperpigmentation: Secondary | ICD-10-CM | POA: Diagnosis not present

## 2021-06-10 DIAGNOSIS — D229 Melanocytic nevi, unspecified: Secondary | ICD-10-CM | POA: Diagnosis not present

## 2021-06-10 DIAGNOSIS — Z85828 Personal history of other malignant neoplasm of skin: Secondary | ICD-10-CM | POA: Diagnosis not present

## 2021-06-10 DIAGNOSIS — Z872 Personal history of diseases of the skin and subcutaneous tissue: Secondary | ICD-10-CM | POA: Diagnosis not present

## 2021-06-10 DIAGNOSIS — I10 Essential (primary) hypertension: Secondary | ICD-10-CM | POA: Diagnosis not present

## 2021-06-24 DIAGNOSIS — Z794 Long term (current) use of insulin: Secondary | ICD-10-CM | POA: Diagnosis not present

## 2021-06-24 DIAGNOSIS — E1121 Type 2 diabetes mellitus with diabetic nephropathy: Secondary | ICD-10-CM | POA: Diagnosis not present

## 2021-06-24 LAB — BASIC METABOLIC PANEL
BUN: 18 (ref 4–21)
CO2: 24 — AB (ref 13–22)
Chloride: 100 (ref 99–108)
Creatinine: 1.1 (ref <=1.3)
Glucose: 86
Potassium: 4.9 (ref 3.4–5.3)
Sodium: 137 (ref 137–147)

## 2021-06-24 LAB — COMPREHENSIVE METABOLIC PANEL
Calcium: 9.9 (ref 8.7–10.7)
GFR calc non Af Amer: 79

## 2021-07-04 DIAGNOSIS — J441 Chronic obstructive pulmonary disease with (acute) exacerbation: Secondary | ICD-10-CM | POA: Diagnosis not present

## 2021-08-03 DIAGNOSIS — J441 Chronic obstructive pulmonary disease with (acute) exacerbation: Secondary | ICD-10-CM | POA: Diagnosis not present

## 2021-08-07 ENCOUNTER — Other Ambulatory Visit: Payer: Self-pay

## 2021-08-07 ENCOUNTER — Ambulatory Visit: Payer: Medicare PPO | Admitting: Pulmonary Disease

## 2021-08-07 ENCOUNTER — Encounter: Payer: Self-pay | Admitting: Family Medicine

## 2021-08-07 ENCOUNTER — Ambulatory Visit (INDEPENDENT_AMBULATORY_CARE_PROVIDER_SITE_OTHER): Payer: Medicare PPO | Admitting: Family Medicine

## 2021-08-07 VITALS — BP 120/70 | HR 80 | Temp 97.8°F | Resp 20 | Ht 66.0 in | Wt 186.9 lb

## 2021-08-07 DIAGNOSIS — G4700 Insomnia, unspecified: Secondary | ICD-10-CM | POA: Diagnosis not present

## 2021-08-07 DIAGNOSIS — I878 Other specified disorders of veins: Secondary | ICD-10-CM

## 2021-08-07 DIAGNOSIS — Z0000 Encounter for general adult medical examination without abnormal findings: Secondary | ICD-10-CM

## 2021-08-07 DIAGNOSIS — I10 Essential (primary) hypertension: Secondary | ICD-10-CM

## 2021-08-07 DIAGNOSIS — E1159 Type 2 diabetes mellitus with other circulatory complications: Secondary | ICD-10-CM | POA: Diagnosis not present

## 2021-08-07 DIAGNOSIS — J9611 Chronic respiratory failure with hypoxia: Secondary | ICD-10-CM | POA: Insufficient documentation

## 2021-08-07 DIAGNOSIS — E785 Hyperlipidemia, unspecified: Secondary | ICD-10-CM

## 2021-08-07 DIAGNOSIS — M62838 Other muscle spasm: Secondary | ICD-10-CM

## 2021-08-07 DIAGNOSIS — Z6830 Body mass index (BMI) 30.0-30.9, adult: Secondary | ICD-10-CM

## 2021-08-07 DIAGNOSIS — I2729 Other secondary pulmonary hypertension: Secondary | ICD-10-CM

## 2021-08-07 DIAGNOSIS — E669 Obesity, unspecified: Secondary | ICD-10-CM | POA: Diagnosis not present

## 2021-08-07 DIAGNOSIS — E1169 Type 2 diabetes mellitus with other specified complication: Secondary | ICD-10-CM

## 2021-08-07 DIAGNOSIS — J418 Mixed simple and mucopurulent chronic bronchitis: Secondary | ICD-10-CM | POA: Diagnosis not present

## 2021-08-07 DIAGNOSIS — I152 Hypertension secondary to endocrine disorders: Secondary | ICD-10-CM

## 2021-08-07 DIAGNOSIS — Z794 Long term (current) use of insulin: Secondary | ICD-10-CM

## 2021-08-07 MED ORDER — AMLODIPINE BESYLATE 5 MG PO TABS: 5.0000 mg | ORAL_TABLET | Freq: Every day | ORAL | 3 refills | Status: DC

## 2021-08-07 MED ORDER — SIMVASTATIN 20 MG PO TABS: 20.0000 mg | ORAL_TABLET | Freq: Every evening | ORAL | 3 refills | Status: DC

## 2021-08-07 MED ORDER — OXYBUTYNIN CHLORIDE ER 10 MG PO TB24: 10.0000 mg | ORAL_TABLET | Freq: Every day | ORAL | 3 refills | Status: DC

## 2021-08-07 MED ORDER — OMEPRAZOLE 20 MG PO CPDR: 20.0000 mg | DELAYED_RELEASE_CAPSULE | Freq: Every day | ORAL | 3 refills | Status: DC

## 2021-08-07 MED ORDER — CYCLOBENZAPRINE HCL 5 MG PO TABS: 5.0000 mg | ORAL_TABLET | Freq: Every day | ORAL | 1 refills | Status: DC

## 2021-08-07 MED ORDER — QUINAPRIL HCL 40 MG PO TABS: 40.0000 mg | ORAL_TABLET | Freq: Every day | ORAL | 3 refills | Status: DC

## 2021-08-07 MED ORDER — METFORMIN HCL 1000 MG PO TABS: 1000.0000 mg | ORAL_TABLET | Freq: Two times a day (BID) | ORAL | 3 refills | Status: DC

## 2021-08-07 MED ORDER — LANTUS SOLOSTAR 100 UNIT/ML ~~LOC~~ SOPN: PEN_INJECTOR | SUBCUTANEOUS | Status: DC

## 2021-08-07 MED ORDER — TAMSULOSIN HCL 0.4 MG PO CAPS
0.8000 mg | ORAL_CAPSULE | Freq: Every day | ORAL | 1 refills | Status: DC
Start: 2021-08-07 — End: 2022-05-28

## 2021-08-07 MED ORDER — FINASTERIDE 5 MG PO TABS: 5.0000 mg | ORAL_TABLET | Freq: Every day | ORAL | 3 refills | Status: DC

## 2021-08-07 MED ORDER — ZOLPIDEM TARTRATE 10 MG PO TABS
10.0000 mg | ORAL_TABLET | Freq: Every day | ORAL | 1 refills | Status: DC
Start: 2021-08-07 — End: 2022-01-30

## 2021-08-07 MED ORDER — PROAIR HFA 108 (90 BASE) MCG/ACT IN AERS: 2.0000 | INHALATION_SPRAY | RESPIRATORY_TRACT | 5 refills | Status: DC | PRN

## 2021-08-07 MED ORDER — IPRATROPIUM-ALBUTEROL 0.5-2.5 (3) MG/3ML IN SOLN: 3.0000 mL | Freq: Four times a day (QID) | RESPIRATORY_TRACT | 2 refills | Status: DC

## 2021-08-07 NOTE — Assessment & Plan Note (Signed)
Reviewed last lipid panel Well controlled Continue simvastatin at current dose

## 2021-08-07 NOTE — Assessment & Plan Note (Signed)
Chronic and stable without recent exacerbations Continue current meds F/b Pulm Refilled nebs and albuterol all day

## 2021-08-07 NOTE — Assessment & Plan Note (Addendum)
F/b Pulm No changes today

## 2021-08-07 NOTE — Assessment & Plan Note (Addendum)
Chronic and well controlled Complicated by HTN, HLD, obesity F/b endocrinology Reviewed last labs No changes to meds Foot exam today UTD on vaccines

## 2021-08-07 NOTE — Assessment & Plan Note (Signed)
F/b Pulm Continue O2 qhs - is prescribed 24 hours per day, but he states that he doesn't wear it through the day

## 2021-08-07 NOTE — Assessment & Plan Note (Signed)
Well controlled Continue current medications Recheck metabolic panel F/u in 6 months  

## 2021-08-07 NOTE — Progress Notes (Signed)
Annual Wellness Visit     Patient: Billy Wise, Male    DOB: Mar 14, 1946, 75 y.o.   MRN: [id number] Visit Date: 08/07/2021  Today's Provider: Lavon Paganini, MD   Chief Complaint  Patient presents with   Medicare Wellness   Subjective    LORENSO Wise is a 75 y.o. male who presents today for his Annual Wellness Visit. He reports consuming a general diet. The patient does not participate in regular exercise at present. He generally feels fairly well. He reports sleeping poorly. He does have additional problems to discuss today.   HPI Getting up hourly to urinate. Not sleeping well  B/l ankle swelling x1 yr. Goes down overnight while sleeping, worse at the end of the day. Sleeping on 1 pillow. Sleeping with his oxygen. Not wearing compression socks.   Medications: Outpatient Medications Prior to Visit  Medication Sig   Accu-Chek Softclix Lancets lancets Use to check blood sugar twice a day. DX E11.9   acidophilus (RISAQUAD) CAPS capsule Take 1 capsule by mouth daily.   ascorbic acid (VITAMIN C) 1000 MG tablet Take 1 tablet by mouth daily.   aspirin EC 81 MG tablet Take 81 mg by mouth daily.   Blood Glucose Monitoring Suppl (ACCU-CHEK AVIVA PLUS) w/Device KIT Use to check blood sugar twice a day.  DX E11.9   BREZTRI AEROSPHERE 160-9-4.8 MCG/ACT AERO INHALE 2 PUFFS INTO THE LUNGS IN THE MORNING AND AT BEDTIME   Dulaglutide (TRULICITY) 4.5 WU/9.8JX SOPN Inject into the skin.   glucose blood (ACCU-CHEK GUIDE) test strip Use as instructed   insulin lispro (HUMALOG) 100 UNIT/ML KwikPen Junior breakfast 22 units and supper 12 units directly with food   Insulin Pen Needle (PEN NEEDLES) 31G X 5 MM MISC 1 each by Does not apply route daily as needed.   Lactobacillus (PROBIOTIC ACIDOPHILUS PO) Take 1 capsule by mouth daily.    Potassium 99 MG TABS Take by mouth daily.    [DISCONTINUED] amLODipine (NORVASC) 5 MG tablet Take 1 tablet (5 mg total) by mouth daily.    [DISCONTINUED] cyclobenzaprine (FLEXERIL) 5 MG tablet Take 1 tablet (5 mg total) by mouth at bedtime.   [DISCONTINUED] Dulaglutide (TRULICITY) 1.5 BJ/4.7WG SOPN Inject 1.5 mg into the skin once a week.   [DISCONTINUED] finasteride (PROSCAR) 5 MG tablet Take 1 tablet (5 mg total) by mouth daily.   [DISCONTINUED] insulin glargine (LANTUS SOLOSTAR) 100 UNIT/ML Solostar Pen Inject 49 units PM   [DISCONTINUED] ipratropium-albuterol (DUONEB) 0.5-2.5 (3) MG/3ML SOLN Take 3 mLs by nebulization every 6 (six) hours.   [DISCONTINUED] metFORMIN (GLUCOPHAGE) 1000 MG tablet Take 1 tablet (1,000 mg total) by mouth 2 (two) times daily.   [DISCONTINUED] omeprazole (PRILOSEC) 20 MG capsule Take 1 capsule (20 mg total) by mouth daily.   [DISCONTINUED] oxybutynin (DITROPAN-XL) 10 MG 24 hr tablet Take 1 tablet (10 mg total) by mouth daily.   [DISCONTINUED] PROAIR HFA 108 (90 Base) MCG/ACT inhaler Inhale 2 puffs into the lungs every 4 (four) hours as needed for wheezing.   [DISCONTINUED] quinapril (ACCUPRIL) 40 MG tablet Take 1 tablet (40 mg total) by mouth daily.   [DISCONTINUED] simvastatin (ZOCOR) 20 MG tablet Take 1 tablet (20 mg total) by mouth every evening.   [DISCONTINUED] tamsulosin (FLOMAX) 0.4 MG CAPS capsule TAKE 1 CAPSULE(0.4 MG) BY MOUTH AT BEDTIME   [DISCONTINUED] zolpidem (AMBIEN) 10 MG tablet TAKE 1 TABLET(10 MG) BY MOUTH DAILY AS NEEDED FOR SLEEP   [DISCONTINUED] glimepiride (AMARYL) 4 MG tablet  TAKE 1 TABLET(4 MG) BY MOUTH TWICE DAILY (Patient not taking: Reported on 08/07/2021)   [DISCONTINUED] LEVEMIR FLEXTOUCH 100 UNIT/ML FlexPen Inject 80 Units into the skin at bedtime. (Patient not taking: Reported on 08/07/2021)   [DISCONTINUED] polycarbophil (FIBERCON) 625 MG tablet Take 625 mg by mouth every evening. (Patient not taking: Reported on 08/07/2021)   [DISCONTINUED] vitamin B-12 (CYANOCOBALAMIN) 1000 MCG tablet Take 1,000 mcg by mouth daily. (Patient not taking: Reported on 08/07/2021)   No  facility-administered medications prior to visit.    No Known Allergies  Patient Care Team: Virginia Crews, MD as PCP - General (Family Medicine) Jyl Heinz, MD as Referring Physician (Endocrinology) Crosby Oyster, MD as Referring Physician (Dermatology) Leandrew Koyanagi, MD as Referring Physician (Ophthalmology)  Review of Systems  Respiratory:  Positive for chest tightness and wheezing.   Cardiovascular:  Positive for leg swelling.   Last CBC Lab Results  Component Value Date   WBC 7.2 08/05/2020   HGB 13.8 08/05/2020   HCT 42.2 08/05/2020   MCV 82 08/05/2020   MCH 26.7 08/05/2020   RDW 14.0 08/05/2020   PLT 207 80/32/1224   Last metabolic panel Lab Results  Component Value Date   GLUCOSE 170 (H) 08/05/2020   NA 137 06/24/2021   K 4.9 06/24/2021   CL 100 06/24/2021   CO2 24 (A) 06/24/2021   BUN 18 06/24/2021   CREATININE 1.1 06/24/2021   GFRNONAA 79 06/24/2021   CALCIUM 9.9 06/24/2021   PROT 7.6 08/05/2020   ALBUMIN 4.2 08/05/2020   LABGLOB 3.4 08/05/2020   AGRATIO 1.2 08/05/2020   BILITOT 0.2 08/05/2020   ALKPHOS 81 08/05/2020   AST 20 08/05/2020   ALT 26 08/05/2020   ANIONGAP 12 11/01/2017   Last lipids Lab Results  Component Value Date   CHOL 116 05/26/2021   HDL 25 (A) 05/26/2021   LDLCALC 50 05/26/2021   TRIG 256 (A) 05/26/2021   CHOLHDL 5.0 08/05/2020   Last hemoglobin A1c Lab Results  Component Value Date   HGBA1C 7.3 05/26/2021        Objective    Vitals: BP 120/70 (BP Location: Left Arm, Patient Position: Sitting, Cuff Size: Large)   Pulse 80   Temp 97.8 F (36.6 C) (Temporal)   Resp 20   Ht _0  (1.676 m)   Wt 186 lb 14.4 oz (84.8 kg)   SpO2 95%   BMI 30.17 kg/m  BP Readings from Last 3 Encounters:  08/07/21 120/70  01/06/21 137/77  09/18/20 120/66   Wt Readings from Last 3 Encounters:  08/07/21 186 lb 14.4 oz (84.8 kg)  01/06/21 185 lb 11.2 oz (84.2 kg)  09/18/20 186 lb 12.8 oz (84.7 kg)       Physical Exam Vitals reviewed.  Constitutional:      General: He is not in acute distress.    Appearance: Normal appearance. He is well-developed. He is not diaphoretic.  HENT:     Head: Normocephalic and atraumatic.     Right Ear: Tympanic membrane, ear canal and external ear normal.     Left Ear: Tympanic membrane, ear canal and external ear normal.     Nose: Nose normal.     Mouth/Throat:     Mouth: Mucous membranes are moist.     Pharynx: Oropharynx is clear. No oropharyngeal exudate.  Eyes:     General: No scleral icterus.    Conjunctiva/sclera: Conjunctivae normal.     Pupils: Pupils are equal, round, and reactive  to light.  Neck:     Thyroid: No thyromegaly.  Cardiovascular:     Rate and Rhythm: Normal rate and regular rhythm.     Pulses: Normal pulses.     Heart sounds: Normal heart sounds. No murmur heard. Pulmonary:     Effort: Pulmonary effort is normal. No respiratory distress.     Breath sounds: Normal breath sounds. No wheezing or rales.  Abdominal:     General: There is no distension.     Palpations: Abdomen is soft.     Tenderness: There is no abdominal tenderness.  Musculoskeletal:        General: No deformity.     Cervical back: Neck supple.     Right lower leg: Edema present.     Left lower leg: Edema present.  Lymphadenopathy:     Cervical: No cervical adenopathy.  Skin:    General: Skin is warm and dry.     Findings: No rash.  Neurological:     Mental Status: He is alert and oriented to person, place, and time. Mental status is at baseline.     Sensory: No sensory deficit.     Motor: No weakness.     Gait: Gait normal.  Psychiatric:        Mood and Affect: Mood normal.        Behavior: Behavior normal.        Thought Content: Thought content normal.     Most recent functional status assessment: In your present state of health, do you have any difficulty performing the following activities: 08/07/2021  Hearing? N  Vision? N  Difficulty  concentrating or making decisions? N  Walking or climbing stairs? Y  Dressing or bathing? N  Doing errands, shopping? N  Some recent data might be hidden   Most recent fall risk assessment: Fall Risk  08/07/2021  Falls in the past year? 0  Number falls in past yr: 0  Injury with Fall? 0  Risk for fall due to : No Fall Risks  Follow up Falls evaluation completed    Most recent depression screenings: PHQ 2/9 Scores 08/07/2021 05/06/2020  PHQ - 2 Score 0 0  PHQ- 9 Score 3 -   Most recent cognitive screening: 6CIT Screen 04/19/2019  What Year? 0 points  What month? 0 points  What time? 0 points  Count back from 20 0 points  Months in reverse 0 points  Repeat phrase 0 points  Total Score 0   Most recent Audit-C alcohol use screening Alcohol Use Disorder Test (AUDIT) 08/07/2021  1. How often do you have a drink containing alcohol? 2  2. How many drinks containing alcohol do you have on a typical day when you are drinking? 0  3. How often do you have six or more drinks on one occasion? 0  AUDIT-C Score 2  Alcohol Brief Interventions/Follow-up -   A score of 3 or more in women, and 4 or more in men indicates increased risk for alcohol abuse, EXCEPT if all of the points are from question 1   No results found for any visits on 08/07/21.  Assessment & Plan     Annual wellness visit done today including the all of the following: Reviewed patient's Family Medical History Reviewed and updated list of patient's medical providers Assessment of cognitive impairment was done Assessed patient's functional ability Established a written schedule for health screening [place] Completed and Reviewed  Exercise Activities and Dietary recommendations  Goals  DIET - REDUCE PORTION SIZE     Recommend to decrease portion sizes by eating 3 small healthy meals and at least 2 healthy snacks per day.        Immunization History  Administered Date(s) Administered    Fluad Quad(high Dose 65+) 08/05/2020   Influenza, High Dose Seasonal PF 07/24/2015, 07/21/2016, 08/02/2017, 08/06/2017, 07/27/2018   Influenza,inj,Quad PF,6+ Mos 06/27/2019   Influenza-Unspecified 09/14/2012, 07/19/2021   PFIZER(Purple Top)SARS-COV-2 Vaccination 11/10/2019, 12/01/2019, 07/14/2020   Pneumococcal Conjugate-13 08/25/2014   Pneumococcal Polysaccharide-23 01/26/2016   Pneumococcal-Unspecified 12/17/2005   Tdap 12/17/2005, 02/25/2016   Zoster Recombinat (Shingrix) 05/16/2019   Zoster, Live 11/24/2013    Health Maintenance  Topic Date Due   Zoster Vaccines- Shingrix (2 of 2) 07/11/2019   COVID-19 Vaccine (4 - Booster for Pfizer series) 09/08/2020   HEMOGLOBIN A1C  11/26/2021   OPHTHALMOLOGY EXAM  01/09/2022   FOOT EXAM  08/07/2022   TETANUS/TDAP  02/24/2026   COLONOSCOPY (Pts 45-58yr Insurance coverage will need to be confirmed)  10/18/2028   Pneumonia Vaccine 75 Years old  Completed   INFLUENZA VACCINE  Completed   Hepatitis C Screening  Completed   HPV VACCINES  Aged Out     Discussed health benefits of physical activity, and encouraged him to engage in regular exercise appropriate for his age and condition.    Problem List Items Addressed This Visit       Cardiovascular and Mediastinum   Hypertension associated with diabetes ([place]    Well controlled Continue current medications Recheck metabolic panel F/u in 6 months       Relevant Medications   insulin lispro (HUMALOG) 100 UNIT/ML KwikPen Junior   Dulaglutide (TRULICITY) 4.5 MCL/2.7NTSOPN   amLODipine (NORVASC) 5 MG tablet   insulin glargine (LANTUS SOLOSTAR) 100 UNIT/ML Solostar Pen   metFORMIN (GLUCOPHAGE) 1000 MG tablet   quinapril (ACCUPRIL) 40 MG tablet   simvastatin (ZOCOR) 20 MG tablet   Other secondary pulmonary hypertension (HCC)    F/b Pulm No changes today      Relevant Medications   amLODipine (NORVASC) 5 MG tablet   quinapril (ACCUPRIL) 40 MG tablet   simvastatin (ZOCOR) 20 MG  tablet     Respiratory   COPD (chronic obstructive pulmonary disease) (HCC)    Chronic and stable without recent exacerbations Continue current meds F/b Pulm Refilled nebs and albuterol all day      Relevant Medications   ipratropium-albuterol (DUONEB) 0.5-2.5 (3) MG/3ML SOLN   PROAIR HFA 108 (90 Base) MCG/ACT inhaler   Chronic respiratory failure with hypoxia (HCC)    F/b Pulm Continue O2 qhs - is prescribed 24 hours per day, but he states that he doesn't wear it through the day        Endocrine   T2DM (type 2 diabetes mellitus) ([place]    Chronic and well controlled Complicated by HTN, HLD, obesity F/b endocrinology Reviewed last labs No changes to meds Foot exam today UTD on vaccines      Relevant Medications   insulin lispro (HUMALOG) 100 UNIT/ML KwikPen Junior   Dulaglutide (TRULICITY) 4.5 MZG/0.1VCSOPN   insulin glargine (LANTUS SOLOSTAR) 100 UNIT/ML Solostar Pen   metFORMIN (GLUCOPHAGE) 1000 MG tablet   quinapril (ACCUPRIL) 40 MG tablet   simvastatin (ZOCOR) 20 MG tablet   Hyperlipidemia associated with type 2 diabetes mellitus ([place]    Reviewed last lipid panel Well controlled Continue simvastatin at current dose      Relevant Medications   insulin lispro (HUMALOG) 100  UNIT/ML KwikPen Junior   Dulaglutide (TRULICITY) 4.5 WQ/3.7DK SOPN   amLODipine (NORVASC) 5 MG tablet   insulin glargine (LANTUS SOLOSTAR) 100 UNIT/ML Solostar Pen   metFORMIN (GLUCOPHAGE) 1000 MG tablet   quinapril (ACCUPRIL) 40 MG tablet   simvastatin (ZOCOR) 20 MG tablet     Other   Insomnia    Chronic and stable Have discussed risks of long-term Ambien use in the past Continue Ambien 10 mg nightly      Obesity    Discussed importance of healthy weight management Discussed diet and exercise       Relevant Medications   insulin lispro (HUMALOG) 100 UNIT/ML KwikPen Junior   Dulaglutide (TRULICITY) 4.5 CC/6.1JU SOPN   insulin glargine (LANTUS SOLOSTAR) 100 UNIT/ML Solostar Pen    metFORMIN (GLUCOPHAGE) 1000 MG tablet   Venous stasis    New problem Reassuring features as it improves overnight No other signs of hypervolemia on exam Reviewed recent echo Encourage elevation and compression      Other Visit Diagnoses     Encounter for annual wellness visit (AWV) in Medicare patient    -  Primary   Encounter for annual physical exam       Primary hypertension       Relevant Medications   amLODipine (NORVASC) 5 MG tablet   quinapril (ACCUPRIL) 40 MG tablet   simvastatin (ZOCOR) 20 MG tablet   Muscle spasm       Relevant Medications   cyclobenzaprine (FLEXERIL) 5 MG tablet        Return in about 1 year (around 08/07/2022) for CPE, AWV.     I, Lavon Paganini, MD, have reviewed all documentation for this visit. The documentation on 08/08/21 for the exam, diagnosis, procedures, and orders are all accurate and complete.   Adlynn Lowenstein, Dionne Bucy, MD, MPH [facility]

## 2021-08-08 NOTE — Assessment & Plan Note (Signed)
Discussed importance of healthy weight management Discussed diet and exercise  

## 2021-08-08 NOTE — Assessment & Plan Note (Signed)
Chronic and stable Have discussed risks of long-term Ambien use in the past Continue Ambien 10 mg nightly

## 2021-08-08 NOTE — Assessment & Plan Note (Signed)
New problem Reassuring features as it improves overnight No other signs of hypervolemia on exam Reviewed recent echo Encourage elevation and compression

## 2021-08-20 ENCOUNTER — Ambulatory Visit: Payer: Medicare PPO | Admitting: Pulmonary Disease

## 2021-08-20 ENCOUNTER — Other Ambulatory Visit: Payer: Self-pay

## 2021-08-20 ENCOUNTER — Encounter: Payer: Self-pay | Admitting: Pulmonary Disease

## 2021-08-20 VITALS — BP 126/70 | HR 70 | Temp 97.9°F | Ht 66.0 in | Wt 190.0 lb

## 2021-08-20 DIAGNOSIS — I272 Pulmonary hypertension, unspecified: Secondary | ICD-10-CM

## 2021-08-20 DIAGNOSIS — E65 Localized adiposity: Secondary | ICD-10-CM | POA: Diagnosis not present

## 2021-08-20 DIAGNOSIS — J449 Chronic obstructive pulmonary disease, unspecified: Secondary | ICD-10-CM | POA: Diagnosis not present

## 2021-08-20 DIAGNOSIS — Z87891 Personal history of nicotine dependence: Secondary | ICD-10-CM | POA: Diagnosis not present

## 2021-08-20 DIAGNOSIS — J9611 Chronic respiratory failure with hypoxia: Secondary | ICD-10-CM | POA: Diagnosis not present

## 2021-08-20 DIAGNOSIS — J849 Interstitial pulmonary disease, unspecified: Secondary | ICD-10-CM | POA: Diagnosis not present

## 2021-08-20 NOTE — Progress Notes (Signed)
Subjective:    Patient ID: Billy Wise, male    DOB: 08-Jun-1946, 75 y.o.   MRN: [id number] Chief Complaint  Patient presents with   Follow-up    prod cough with clear sputum and mild wheezing.     HPI This is a 75 year old former smoker, who follows for COPD with chronic hypoxic respiratory failure on supplemental oxygen at 2 L/min.  This is a scheduled visit.  Last seen at the clinic by me in summer 2021.  He has moderate to severe COPD on the basis of emphysema with associated fibrosis related to smoking.  Prior CT scan of the chest was not compatible with IPF.  At a prior visit he was noted to have increasing dyspnea the oximetry showed desaturations with exercise he was placed on 2 L/min during ambulation.  Since that time his he has been compliant with oxygen therapy and dyspnea has markedly improved.  He is now able to continue his exercise program at home with supplemental oxygen.  He continues to do stationary bicycle exercise for approximately 20 minutes/day.  He is on Breztri 2 puffs twice a day and feels that this continues to give him good relief of his symptoms.  He is compliant with the medication.  He has not had any recent exacerbations.  He voices no active complaint today.  No fevers, chills or sweats.  No chest pain, no orthopnea or paroxysmal nocturnal dyspnea.  Overall he looks well and feels well.    DATA: CBC 10/28/17>>eosinophils equals 0. Sleep Study 11/27/17>> no sleep apnea.  Nocturnal desaturations noted. Desat walk 11/15/17>> baseline sat on RA at rest was 91% and HR 89. Walked 360 feet at a very brisk pace, was conversational. Sat was 88% and HR 112.  Moderate dyspnea. CT chest personally reviewed>> CT hi-res  02/01/18; chest x-ray 10/28/17; there is mild basilar fibrosis, bronchiectatic changes with bronchial thickening, severe apical emphysema. Findings not likely due to UIP.  PFT tracings personally reviewed from 02/01/18>> FVC is 86% predicted, FEV1 is 67%  predicted, there is no improvement with bronchodilator.  Ratio 61%. DLCO is reduced at 52%.Flow volume loop is obstructive. Ambulatory oximetry 07/19/2020: Desaturations with exercise, initiated oxygen at 2 L/min 2D echocardiogram 07/26/2020: LVEF 60 to 65%, grade 1 DD, moderate elevation on pulmonary artery systolic pressure. PFTs 07/31/2020: FEV1 1.57 L or 57% predicted, FVC 2.79 L or 74% predicted.  FEV1/FVC 56%.  Postbronchodilator there is a 10% change in FEV1 lung volumes showed mild air trapping no hyperinflation, diffusion capacity moderately reduced.  Consistent with COPD emphysema.  Flow volume loop is delayed consistent with obstructive lung disease.    Review of Systems A 10 point review of systems was performed and it is as noted above otherwise negative.  Patient Active Problem List   Diagnosis Date Noted   Chronic respiratory failure with hypoxia ([place]) 08/07/2021   Venous stasis 08/07/2021   Chest pain 01/06/2021   Orthostatic hypotension 01/06/2021   Other secondary pulmonary hypertension ([place]) 15/83/0940   Diastolic dysfunction 76/80/8811   Obesity 10/25/2019   History of anemia 04/20/2019   Gastroesophageal reflux disease without esophagitis 09/09/2018   Hyperlipidemia associated with type 2 diabetes mellitus ([place]) 09/09/2018   T2DM (type 2 diabetes mellitus) ([place]) 10/28/2017   COPD (chronic obstructive pulmonary disease) ([place]) 10/28/2017   Hypertension associated with diabetes ([place]) 10/28/2017   Insomnia 09/03/2016   BPH with obstruction/lower urinary tract symptoms 12/16/2015   Social History   Tobacco Use   Smoking status:  Former    Packs/day: 1.50    Years: 50.00    Pack years: 75.00    Types: Cigarettes    Quit date: 09/29/2008    Years since quitting: 12.8   Smokeless tobacco: Never  Substance Use Topics   Alcohol use: Yes    Comment: 2 drinks a month / cocktail   No Known Allergies Current Meds  Medication Sig   Accu-Chek Softclix Lancets lancets  Use to check blood sugar twice a day. DX E11.9   acidophilus (RISAQUAD) CAPS capsule Take 1 capsule by mouth daily.   amLODipine (NORVASC) 5 MG tablet Take 1 tablet (5 mg total) by mouth daily.   ascorbic acid (VITAMIN C) 1000 MG tablet Take 1 tablet by mouth daily.   aspirin EC 81 MG tablet Take 81 mg by mouth daily.   Blood Glucose Monitoring Suppl (ACCU-CHEK AVIVA PLUS) w/Device KIT Use to check blood sugar twice a day.  DX E11.9   BREZTRI AEROSPHERE 160-9-4.8 MCG/ACT AERO INHALE 2 PUFFS INTO THE LUNGS IN THE MORNING AND AT BEDTIME   cyclobenzaprine (FLEXERIL) 5 MG tablet Take 1 tablet (5 mg total) by mouth at bedtime.   Dulaglutide (TRULICITY) 4.5 SE/3.9RV SOPN Inject into the skin.   finasteride (PROSCAR) 5 MG tablet Take 1 tablet (5 mg total) by mouth daily.   glucose blood (ACCU-CHEK GUIDE) test strip Use as instructed   insulin glargine (LANTUS SOLOSTAR) 100 UNIT/ML Solostar Pen Inject 49 units PM   insulin lispro (HUMALOG) 100 UNIT/ML KwikPen Junior breakfast 22 units and supper 12 units directly with food   Insulin Pen Needle (PEN NEEDLES) 31G X 5 MM MISC 1 each by Does not apply route daily as needed.   ipratropium-albuterol (DUONEB) 0.5-2.5 (3) MG/3ML SOLN Take 3 mLs by nebulization every 6 (six) hours.   Lactobacillus (PROBIOTIC ACIDOPHILUS PO) Take 1 capsule by mouth daily.    metFORMIN (GLUCOPHAGE) 1000 MG tablet Take 1 tablet (1,000 mg total) by mouth 2 (two) times daily.   omeprazole (PRILOSEC) 20 MG capsule Take 1 capsule (20 mg total) by mouth daily.   oxybutynin (DITROPAN-XL) 10 MG 24 hr tablet Take 1 tablet (10 mg total) by mouth daily.   Potassium 99 MG TABS Take by mouth daily.    PROAIR HFA 108 (90 Base) MCG/ACT inhaler Inhale 2 puffs into the lungs every 4 (four) hours as needed for wheezing.   quinapril (ACCUPRIL) 40 MG tablet Take 1 tablet (40 mg total) by mouth daily.   simvastatin (ZOCOR) 20 MG tablet Take 1 tablet (20 mg total) by mouth every evening.   tamsulosin  (FLOMAX) 0.4 MG CAPS capsule Take 2 capsules (0.8 mg total) by mouth daily.   zolpidem (AMBIEN) 10 MG tablet Take 1 tablet (10 mg total) by mouth at bedtime.   Immunization History  Administered Date(s) Administered   Fluad Quad(high Dose 65+) 08/05/2020   Influenza, High Dose Seasonal PF 07/24/2015, 07/21/2016, 08/02/2017, 08/06/2017, 07/27/2018   Influenza,inj,Quad PF,6+ Mos 06/27/2019   Influenza-Unspecified 09/14/2012, 07/19/2021   Moderna SARS-COV2 Booster Vaccination 07/06/2021   PFIZER(Purple Top)SARS-COV-2 Vaccination 11/10/2019, 12/01/2019, 07/14/2020   Pfizer Covid-19 Vaccine Bivalent Booster 5yr & up 01/26/2021   Pneumococcal Conjugate-13 08/25/2014   Pneumococcal Polysaccharide-23 01/26/2016   Pneumococcal-Unspecified 12/17/2005   Tdap 12/17/2005, 02/25/2016   Zoster Recombinat (Shingrix) 05/16/2019   Zoster, Live 11/24/2013         Objective:   Physical Exam BP 126/70 (BP Location: Left Arm, Cuff Size: Normal)   Pulse 70  Temp 97.9 F (36.6 C) (Temporal)   Ht '5\' 6"'  (1.676 m)   Wt 190 lb (86.2 kg)   SpO2 95%   BMI 30.67 kg/m  GENERAL: Awake, alert, no respiratory distress.  Fully ambulatory. HEAD: Normocephalic, atraumatic.  EYES: Pupils equal, round, reactive to light.  No scleral icterus.  MOUTH: Nose/mouth/throat not examined due to masking requirements for COVID 19. NECK: Supple. No thyromegaly. Trachea midline. No JVD.  No adenopathy. PULMONARY: Good air entry bilaterally.  Coarse, dry crackles at bases, no other adventitious sounds. CARDIOVASCULAR: S1 and S2. Regular rate and rhythm.  No rubs, murmurs or gallops heard. ABDOMEN: Protuberant, truncal obesity, otherwise benign. MUSCULOSKELETAL: No joint deformity, no clubbing, trace edema.  NEUROLOGIC: No deficits, no gait disturbance, speech is fluent. SKIN: Intact,warm,dry.  Stasis changes lower extremities. PSYCH: Mood and behavior normal.     Assessment & Plan:     ICD-10-CM   1. Moderate COPD  (chronic obstructive pulmonary disease) (HCC)  J44.9    Continue Breztri 2 puffs twice a day Continue as needed albuterol    2. Interstitial pulmonary disease (HCC)  J84.9    Smoking-related ILD Clinically no progression    3. Chronic respiratory failure with hypoxia (HCC)  J96.11    Continue oxygen at 2 L/min    4. Pulmonary hypertension (HCC)  I27.20    Continue supplemental oxygen Periodic follow-up of 2D echo    5. Truncal obesity  E65    This issue adds complexity to his management Decreases vital capacity    6. Former heavy cigarette smoker (20-39 per day)  Z87.891 Ambulatory Referral for Lung Cancer Scre   Enroll in lung cancer screening     Orders Placed This Encounter  Procedures   Ambulatory Referral for Lung Cancer Scre    Referral Priority:   Routine    Referral Type:   Consultation    Referral Reason:   Specialty Services Required    Number of Visits Requested:   1   Patient continues to do well.  We will see him in follow-up in 6 months time he is to call sooner should any new problems arise.  Renold Don, MD Advanced Bronchoscopy PCCM [facility]    *This note was dictated using voice recognition software/Dragon.  Despite best efforts to proofread, errors can occur which can change the meaning.  Any change was purely unintentional.

## 2021-08-20 NOTE — Patient Instructions (Signed)
We have sent a referral to the lung cancer screening program  Continue using your Breztri 2 puffs twice a day  We will see you in follow-up in 6 months time call sooner should any new problems arise

## 2021-09-04 ENCOUNTER — Telehealth: Payer: Self-pay | Admitting: Pulmonary Disease

## 2021-09-04 ENCOUNTER — Other Ambulatory Visit: Payer: Self-pay | Admitting: Pulmonary Disease

## 2021-09-04 DIAGNOSIS — Z87891 Personal history of nicotine dependence: Secondary | ICD-10-CM

## 2021-09-04 DIAGNOSIS — J418 Mixed simple and mucopurulent chronic bronchitis: Secondary | ICD-10-CM

## 2021-09-05 NOTE — Telephone Encounter (Signed)
Spoke with patient Scheduled SDMV 10/06/21 3:00 CT order was placed Pt voiced understanding and had no further questions.

## 2021-09-09 ENCOUNTER — Encounter: Payer: Self-pay | Admitting: Family Medicine

## 2021-09-10 ENCOUNTER — Other Ambulatory Visit: Payer: Self-pay | Admitting: Physician Assistant

## 2021-09-10 DIAGNOSIS — E1159 Type 2 diabetes mellitus with other circulatory complications: Secondary | ICD-10-CM

## 2021-09-10 MED ORDER — RAMIPRIL 10 MG PO CAPS: 10.0000 mg | ORAL_CAPSULE | Freq: Every day | ORAL | 3 refills | Status: DC

## 2021-09-10 NOTE — Progress Notes (Signed)
Pharm out of quinapril 40 mg, equivi is ramipril 10, sent to pharm

## 2021-09-23 DIAGNOSIS — I10 Essential (primary) hypertension: Secondary | ICD-10-CM | POA: Diagnosis not present

## 2021-09-23 DIAGNOSIS — Z8669 Personal history of other diseases of the nervous system and sense organs: Secondary | ICD-10-CM | POA: Diagnosis not present

## 2021-09-23 DIAGNOSIS — E65 Localized adiposity: Secondary | ICD-10-CM | POA: Diagnosis not present

## 2021-09-23 DIAGNOSIS — J449 Chronic obstructive pulmonary disease, unspecified: Secondary | ICD-10-CM | POA: Diagnosis not present

## 2021-09-23 DIAGNOSIS — Z8249 Family history of ischemic heart disease and other diseases of the circulatory system: Secondary | ICD-10-CM | POA: Diagnosis not present

## 2021-09-23 DIAGNOSIS — E785 Hyperlipidemia, unspecified: Secondary | ICD-10-CM | POA: Diagnosis not present

## 2021-09-23 DIAGNOSIS — E1121 Type 2 diabetes mellitus with diabetic nephropathy: Secondary | ICD-10-CM | POA: Diagnosis not present

## 2021-09-23 DIAGNOSIS — Z794 Long term (current) use of insulin: Secondary | ICD-10-CM | POA: Diagnosis not present

## 2021-10-06 ENCOUNTER — Encounter: Payer: Self-pay | Admitting: Acute Care

## 2021-10-06 ENCOUNTER — Other Ambulatory Visit: Payer: Self-pay

## 2021-10-06 ENCOUNTER — Ambulatory Visit (INDEPENDENT_AMBULATORY_CARE_PROVIDER_SITE_OTHER): Payer: Medicare PPO | Admitting: Acute Care

## 2021-10-06 DIAGNOSIS — Z87891 Personal history of nicotine dependence: Secondary | ICD-10-CM | POA: Diagnosis not present

## 2021-10-06 NOTE — Progress Notes (Signed)
Virtual Visit via Telephone Note  I connected with LON KLIPPEL on 10/06/21 at  3:00 PM EST by telephone and verified that I am speaking with the correct person using two identifiers.  Location: Patient: At home Provider: [address] [place], [place], [address]    I discussed the limitations, risks, security and privacy concerns of performing an evaluation and management service by telephone and the availability of in person appointments. I also discussed with the patient that there may be a patient responsible charge related to this service. The patient expressed understanding and agreed to proceed.    Shared Decision Making Visit Lung Cancer Screening Program [phone])   Eligibility: Age 75 y.o. Pack Years Smoking History Calculation 80 pack year smoking history (# packs/per year x # years smoked) Recent History of coughing up blood  no Unexplained weight loss? no ( >Than 15 pounds within the last 6 months ) Prior History Lung / other cancer no (Diagnosis within the last 5 years already requiring surveillance chest CT Scans). Smoking Status Former Smoker Former Smokers: Years since quit: 12 years  Quit Date: 2010  Visit Components: Discussion included one or more decision making aids. yes Discussion included risk/benefits of screening. yes Discussion included potential follow up diagnostic testing for abnormal scans. yes Discussion included meaning and risk of over diagnosis. yes Discussion included meaning and risk of False Positives. yes Discussion included meaning of total radiation exposure. yes  Counseling Included: Importance of adherence to annual lung cancer LDCT screening. yes Impact of comorbidities on ability to participate in the program. yes Ability and willingness to under diagnostic treatment. yes  Smoking Cessation Counseling: Current Smokers:  Discussed importance of smoking cessation. yes Information about tobacco cessation classes and  interventions provided to patient. yes Patient provided with "ticket" for LDCT Scan. yes Symptomatic Patient. no  Counseling Diagnosis Code: Tobacco Use Z72.0 Asymptomatic Patient yes  Counseling (Intermediate counseling: > three minutes counseling) [id number] Former Smokers:  Discussed the importance of maintaining cigarette abstinence. yes Diagnosis Code: Personal History of Nicotine Dependence. E99.371 Information about tobacco cessation classes and interventions provided to patient. Yes Patient provided with "ticket" for LDCT Scan. yes Written Order for Lung Cancer Screening with LDCT placed in Epic. Yes (CT Chest Lung Cancer Screening Low Dose W/O CM) [id number] Z12.2-Screening of respiratory organs Z87.891-Personal history of nicotine dependence  I spent 25 minutes of face to face time/virtual visit time  with Mr. Hedden discussing the risks and benefits of lung cancer screening. We took the time to pause the power point at intervals to allow for questions to be asked and answered to ensure understanding. We discussed that he had taken the single most powerful action possible to decrease his risk of developing lung cancer when he quit smoking. I counseled him to remain smoke free, and to contact me if he ever had the desire to smoke again so that I can provide resources and tools to help support the effort to remain smoke free. We discussed the time and location of the scan, and that either  Abigail Miyamoto RN, Karlton Lemon, RN or I  or I will call / send a letter with the results within  24-72 hours of receiving them. He has the office contact information in the event he needs to speak with me,  He verbalized understanding of all of the above and had no further questions upon leaving the office.     I explained to the patient that there has been a  high incidence of coronary artery disease noted on these exams. I explained that this is a non-gated exam therefore degree or severity cannot be  determined. This patient is on statin therapy. I have asked the patient to follow-up with their PCP regarding any incidental finding of coronary artery disease and management with diet or medication as they feel is clinically indicated. The patient verbalized understanding of the above and had no further questions.     Bevelyn Ngo, NP 10/06/2021

## 2021-10-06 NOTE — Patient Instructions (Signed)
Thank you for participating in the [facility] Cancer Screening Program. °It was our pleasure to meet you today. °We will call you with the results of your scan within the next few days. °Your scan will be assigned a Lung RADS category score by the physicians reading the scans.  °This Lung RADS score determines follow up scanning.  °See below for description of categories, and follow up screening recommendations. °We will be in touch to schedule your follow up screening annually or based on recommendations of our providers. °We will fax a copy of your scan results to your Primary Care Physician, or the physician who referred you to the program, to ensure they have the results. °Please call the office if you have any questions or concerns regarding your scanning experience or results.  °Our office number is [phone]. °Please speak with Denise Phelps, RN. She is our Lung Cancer Screening RN. °If she is unavailable when you call, please have the office staff send her a message. She will return your call at her earliest convenience. °Remember, if your scan is normal, we will scan you annually as long as you continue to meet the criteria for the program. (Age 55-77, Current smoker or smoker who has quit within the last 15 years). °If you are a smoker, remember, quitting is the single most powerful action that you can take to decrease your risk of lung cancer and other pulmonary, breathing related problems. °We know quitting is hard, and we are here to help.  °Please let us know if there is anything we can do to help you meet your goal of quitting. °If you are a former smoker, congratulations. We are proud of you! Remain smoke free! °Remember you can refer friends or family members through the number above.  °We will screen them to make sure they meet criteria for the program. °Thank you for helping us take better care of you by participating in Lung Screening. ° °You can receive free nicotine replacement therapy  ( patches, gum or mints) by calling 1-800-QUIT NOW. Please call so we can get you on the path to becoming  a non-smoker. I know it is hard, but you can do this! ° °Lung RADS Categories: ° °Lung RADS 1: no nodules or definitely non-concerning nodules.  °Recommendation is for a repeat annual scan in 12 months. ° °Lung RADS 2:  nodules that are non-concerning in appearance and behavior with a very low likelihood of becoming an active cancer. °Recommendation is for a repeat annual scan in 12 months. ° °Lung RADS 3: nodules that are probably non-concerning , includes nodules with a low likelihood of becoming an active cancer.  Recommendation is for a 6-month repeat screening scan. Often noted after an upper respiratory illness. We will be in touch to make sure you have no questions, and to schedule your 6-month scan. ° °Lung RADS 4 A: nodules with concerning findings, recommendation is most often for a follow up scan in 3 months or additional testing based on our provider's assessment of the scan. We will be in touch to make sure you have no questions and to schedule the recommended 3 month follow up scan. ° °Lung RADS 4 B:  indicates findings that are concerning. We will be in touch with you to schedule additional diagnostic testing based on our provider's  assessment of the scan. ° °Hypnosis for smoking cessation  °[employer] °[phone] ° °Acupuncture for smoking cessation  °[facility] °[phone]  °

## 2021-10-07 ENCOUNTER — Ambulatory Visit
Admission: RE | Admit: 2021-10-07 | Discharge: 2021-10-07 | Disposition: A | Discharge status: Home / Self Care | Payer: Medicare PPO | Source: Ambulatory Visit | Attending: Acute Care | Admitting: Acute Care

## 2021-10-07 DIAGNOSIS — Z87891 Personal history of nicotine dependence: Secondary | ICD-10-CM

## 2021-10-09 ENCOUNTER — Ambulatory Visit: Payer: Self-pay | Admitting: *Deleted

## 2021-10-09 DIAGNOSIS — Z8709 Personal history of other diseases of the respiratory system: Secondary | ICD-10-CM | POA: Diagnosis not present

## 2021-10-09 DIAGNOSIS — Z03818 Encounter for observation for suspected exposure to other biological agents ruled out: Secondary | ICD-10-CM | POA: Diagnosis not present

## 2021-10-09 DIAGNOSIS — J40 Bronchitis, not specified as acute or chronic: Secondary | ICD-10-CM | POA: Diagnosis not present

## 2021-10-09 DIAGNOSIS — R197 Diarrhea, unspecified: Secondary | ICD-10-CM | POA: Diagnosis not present

## 2021-10-09 NOTE — Telephone Encounter (Signed)
FYI

## 2021-10-09 NOTE — Telephone Encounter (Signed)
Noted  

## 2021-10-09 NOTE — Telephone Encounter (Signed)
°  Chief Complaint: cough,chest congestion, SOB-COPD Symptoms: diarrhea Frequency: symptoms started Monday Pertinent Negatives: Patient denies fever Disposition: [] ED /[x] Urgent Care (no appt availability in office) / [] Appointment(In office/virtual)/ []  [facility]/ [] Home Care/ [] Refused Recommended Disposition  Additional Notes: Negative COVID test

## 2021-10-09 NOTE — Telephone Encounter (Signed)
Reason for Disposition  [1] Longstanding difficulty breathing (e.g., CHF, COPD, emphysema) AND [2] WORSE than normal  Answer Assessment - Initial Assessment Questions 1. RESPIRATORY STATUS: "Describe your breathing?" (e.g., wheezing, shortness of breath, unable to speak, severe coughing)      SOB- COPD, cough- green sputum, COVID test- negative 2. ONSET: "When did this breathing problem begin?"      Monday 3. PATTERN "Does the difficult breathing come and go, or has it been constant since it started?"      Comes and goes- exertion 4. SEVERITY: "How bad is your breathing?" (e.g., mild, moderate, severe)    - MILD: No SOB at rest, mild SOB with walking, speaks normally in sentences, can lie down, no retractions, pulse < 100.    - MODERATE: SOB at rest, SOB with minimal exertion and prefers to sit, cannot lie down flat, speaks in phrases, mild retractions, audible wheezing, pulse 100-120.    - SEVERE: Very SOB at rest, speaks in single words, struggling to breathe, sitting hunched forward, retractions, pulse > 120      Mild- COPD 5. RECURRENT SYMPTOM: "Have you had difficulty breathing before?" If Yes, ask: "When was the last time?" and "What happened that time?"      Yes- URI- COPD- antibiotic 6. CARDIAC HISTORY: "Do you have any history of heart disease?" (e.g., heart attack, angina, bypass surgery, angioplasty)      no 7. LUNG HISTORY: "Do you have any history of lung disease?"  (e.g., pulmonary embolus, asthma, emphysema)     COPD 8. CAUSE: "What do you think is causing the breathing problem?"      URI 9. OTHER SYMPTOMS: "Do you have any other symptoms? (e.g., dizziness, runny nose, cough, chest pain, fever)     Cough, diarrhea-since Monday 10. O2 SATURATION MONITOR:  "Do you use an oxygen saturation monitor (pulse oximeter) at home?" If Yes, "What is your reading (oxygen level) today?" "What is your usual oxygen saturation reading?" (e.g., 95%)       Sitting-94% on O2 11. PREGNANCY: "Is  there any chance you are pregnant?" "When was your last menstrual period?"       na 12. TRAVEL: "Have you traveled out of the country in the last month?" (e.g., travel history, exposures)       Employee has flu- 2 weeks ago  Protocols used: Breathing Difficulty-A-AH

## 2021-10-10 ENCOUNTER — Other Ambulatory Visit: Payer: Self-pay | Admitting: Acute Care

## 2021-10-10 DIAGNOSIS — Z87891 Personal history of nicotine dependence: Secondary | ICD-10-CM

## 2021-10-29 ENCOUNTER — Other Ambulatory Visit: Payer: Self-pay | Admitting: Family Medicine

## 2021-10-29 NOTE — Telephone Encounter (Signed)
Requested Prescriptions  Pending Prescriptions Disp Refills   ipratropium-albuterol (DUONEB) 0.5-2.5 (3) MG/3ML SOLN [Pharmacy Med Name: IPRATROPI/ALB 0.5/3MG  INH SL 30X3ML] 360 mL 2    Sig: USE 3 ML VIA NEBULIZER EVERY 6 HOURS     Pulmonology:  Combination Products Passed - 10/29/2021 11:05 AM      Passed - Valid encounter within last 12 months    Recent Outpatient Visits          2 months ago Encounter for annual wellness visit (AWV) in Medicare patient   [facility] [place], Marzella Schlein, [place]   9 months ago Orthostatic hypotension   [facility] [place], Marzella Schlein, MD   1 year ago Annual physical exam   [facility], Alessandra Bevels, PA-C   1 year ago COPD exacerbation [facility])   [facility], Lavella Hammock, [place]   2 years ago Essential hypertension   [facility] [place], Marzella Schlein, MD      Future Appointments            In 9 months [place], Marzella Schlein, MD [facility]

## 2022-01-12 DIAGNOSIS — E113293 Type 2 diabetes mellitus with mild nonproliferative diabetic retinopathy without macular edema, bilateral: Secondary | ICD-10-CM | POA: Diagnosis not present

## 2022-01-12 DIAGNOSIS — Z0100 Encounter for examination of eyes and vision without abnormal findings: Secondary | ICD-10-CM | POA: Diagnosis not present

## 2022-01-12 LAB — HM DIABETES EYE EXAM

## 2022-01-27 DIAGNOSIS — I10 Essential (primary) hypertension: Secondary | ICD-10-CM | POA: Diagnosis not present

## 2022-01-27 DIAGNOSIS — Z8669 Personal history of other diseases of the nervous system and sense organs: Secondary | ICD-10-CM | POA: Diagnosis not present

## 2022-01-27 DIAGNOSIS — E785 Hyperlipidemia, unspecified: Secondary | ICD-10-CM | POA: Diagnosis not present

## 2022-01-27 DIAGNOSIS — E1121 Type 2 diabetes mellitus with diabetic nephropathy: Secondary | ICD-10-CM | POA: Diagnosis not present

## 2022-01-27 DIAGNOSIS — Z8249 Family history of ischemic heart disease and other diseases of the circulatory system: Secondary | ICD-10-CM | POA: Diagnosis not present

## 2022-01-27 DIAGNOSIS — J449 Chronic obstructive pulmonary disease, unspecified: Secondary | ICD-10-CM | POA: Diagnosis not present

## 2022-01-27 DIAGNOSIS — Z794 Long term (current) use of insulin: Secondary | ICD-10-CM | POA: Diagnosis not present

## 2022-01-27 DIAGNOSIS — E65 Localized adiposity: Secondary | ICD-10-CM | POA: Diagnosis not present

## 2022-01-30 ENCOUNTER — Other Ambulatory Visit: Payer: Self-pay | Admitting: Family Medicine

## 2022-01-30 NOTE — Telephone Encounter (Signed)
[facility] request for the following medications: ° °zolpidem (AMBIEN) 10 MG tablet  ° °Please advise. °

## 2022-02-02 MED ORDER — ZOLPIDEM TARTRATE 10 MG PO TABS
10.0000 mg | ORAL_TABLET | Freq: Every day | ORAL | 1 refills | Status: DC
Start: 2022-02-02 — End: 2022-07-27

## 2022-03-12 DIAGNOSIS — R809 Proteinuria, unspecified: Secondary | ICD-10-CM | POA: Diagnosis not present

## 2022-03-12 DIAGNOSIS — I10 Essential (primary) hypertension: Secondary | ICD-10-CM | POA: Diagnosis not present

## 2022-03-12 DIAGNOSIS — E1129 Type 2 diabetes mellitus with other diabetic kidney complication: Secondary | ICD-10-CM | POA: Diagnosis not present

## 2022-04-27 DIAGNOSIS — R809 Proteinuria, unspecified: Secondary | ICD-10-CM | POA: Diagnosis not present

## 2022-04-27 DIAGNOSIS — N400 Enlarged prostate without lower urinary tract symptoms: Secondary | ICD-10-CM | POA: Diagnosis not present

## 2022-04-27 DIAGNOSIS — N2889 Other specified disorders of kidney and ureter: Secondary | ICD-10-CM | POA: Diagnosis not present

## 2022-04-27 DIAGNOSIS — I10 Essential (primary) hypertension: Secondary | ICD-10-CM | POA: Diagnosis not present

## 2022-05-19 ENCOUNTER — Ambulatory Visit: Payer: Medicare PPO | Admitting: Urology

## 2022-05-28 ENCOUNTER — Telehealth: Payer: Self-pay | Admitting: Family Medicine

## 2022-05-28 MED ORDER — TAMSULOSIN HCL 0.4 MG PO CAPS: 0.8000 mg | ORAL_CAPSULE | Freq: Every day | ORAL | 0 refills | Status: DC

## 2022-05-28 NOTE — Telephone Encounter (Signed)
[facility] faxed refill request for the following medications: ° °tamsulosin (FLOMAX) 0.4 MG CAPS capsule  ° °Please advise. ° °

## 2022-06-01 DIAGNOSIS — Z794 Long term (current) use of insulin: Secondary | ICD-10-CM | POA: Diagnosis not present

## 2022-06-01 DIAGNOSIS — E1121 Type 2 diabetes mellitus with diabetic nephropathy: Secondary | ICD-10-CM | POA: Diagnosis not present

## 2022-06-01 DIAGNOSIS — E65 Localized adiposity: Secondary | ICD-10-CM | POA: Diagnosis not present

## 2022-06-01 DIAGNOSIS — I10 Essential (primary) hypertension: Secondary | ICD-10-CM | POA: Diagnosis not present

## 2022-06-01 DIAGNOSIS — E785 Hyperlipidemia, unspecified: Secondary | ICD-10-CM | POA: Diagnosis not present

## 2022-06-01 DIAGNOSIS — Z8249 Family history of ischemic heart disease and other diseases of the circulatory system: Secondary | ICD-10-CM | POA: Diagnosis not present

## 2022-06-10 DIAGNOSIS — L570 Actinic keratosis: Secondary | ICD-10-CM | POA: Diagnosis not present

## 2022-06-10 DIAGNOSIS — E875 Hyperkalemia: Secondary | ICD-10-CM | POA: Diagnosis not present

## 2022-06-10 DIAGNOSIS — D229 Melanocytic nevi, unspecified: Secondary | ICD-10-CM | POA: Diagnosis not present

## 2022-06-10 DIAGNOSIS — Z872 Personal history of diseases of the skin and subcutaneous tissue: Secondary | ICD-10-CM | POA: Diagnosis not present

## 2022-06-10 DIAGNOSIS — I10 Essential (primary) hypertension: Secondary | ICD-10-CM | POA: Diagnosis not present

## 2022-06-10 DIAGNOSIS — L821 Other seborrheic keratosis: Secondary | ICD-10-CM | POA: Diagnosis not present

## 2022-06-10 DIAGNOSIS — L814 Other melanin hyperpigmentation: Secondary | ICD-10-CM | POA: Diagnosis not present

## 2022-06-10 DIAGNOSIS — Z85828 Personal history of other malignant neoplasm of skin: Secondary | ICD-10-CM | POA: Diagnosis not present

## 2022-06-12 ENCOUNTER — Other Ambulatory Visit: Payer: Self-pay | Admitting: Pulmonary Disease

## 2022-06-12 DIAGNOSIS — J418 Mixed simple and mucopurulent chronic bronchitis: Secondary | ICD-10-CM

## 2022-06-26 ENCOUNTER — Telehealth: Payer: Medicare PPO | Admitting: Physician Assistant

## 2022-06-26 ENCOUNTER — Ambulatory Visit: Payer: Self-pay

## 2022-06-26 DIAGNOSIS — U071 COVID-19: Secondary | ICD-10-CM | POA: Diagnosis not present

## 2022-06-26 MED ORDER — BENZONATATE 100 MG PO CAPS: 100.0000 mg | ORAL_CAPSULE | Freq: Three times a day (TID) | ORAL | 0 refills | Status: DC | PRN

## 2022-06-26 MED ORDER — MOLNUPIRAVIR EUA 200MG CAPSULE
4.0000 | ORAL_CAPSULE | Freq: Two times a day (BID) | ORAL | 0 refills | Status: AC
Start: 2022-06-26 — End: 2022-07-01

## 2022-06-26 NOTE — Progress Notes (Signed)
Virtual Visit Consent   Billy Wise, you are scheduled for a virtual visit with a [facility] provider today. Just as with appointments in the office, your consent must be obtained to participate. Your consent will be active for this visit and any virtual visit you may have with one of our providers in the next 365 days. If you have a MyChart account, a copy of this consent can be sent to you electronically.  As this is a virtual visit, video technology does not allow for your provider to perform a traditional examination. This may limit your provider's ability to fully assess your condition. If your provider identifies any concerns that need to be evaluated in person or the need to arrange testing (such as labs, EKG, etc.), we will make arrangements to do so. Although advances in technology are sophisticated, we cannot ensure that it will always work on either your end or our end. If the connection with a video visit is poor, the visit may have to be switched to a telephone visit. With either a video or telephone visit, we are not always able to ensure that we have a secure connection.  By engaging in this virtual visit, you consent to the provision of healthcare and authorize for your insurance to be billed (if applicable) for the services provided during this visit. Depending on your insurance coverage, you may receive a charge related to this service.  I need to obtain your verbal consent now. Are you willing to proceed with your visit today? Billy Wise has provided verbal consent on 06/26/2022 for a virtual visit (video or telephone). Mar Daring, PA-C  Date: 06/26/2022 4:24 PM  Virtual Visit via Video Note   I, Mar Daring, connected with  Billy Wise  ([id number], October 06, 1946) on 06/26/22 at  4:15 PM EDT by a video-enabled telemedicine application and verified that I am speaking with the correct person using two identifiers.  Location: Patient: Virtual Visit  Location Patient: Home Provider: Virtual Visit Location Provider: Home Office   I discussed the limitations of evaluation and management by telemedicine and the availability of in person appointments. The patient expressed understanding and agreed to proceed.    History of Present Illness: Billy Wise is a 76 y.o. who identifies as a male who was assigned male at birth, and is being seen today for Covid 45.  HPI: URI  This is a new problem. Episode onset: Symptoms started yesterday; Tested positive for Covid 19 today on at home test. The problem has been gradually worsening. Maximum temperature: 98.8 yesterday. The fever has been present for Less than 1 day. Associated symptoms include congestion, coughing, headaches, rhinorrhea, sinus pain, sneezing and a sore throat (from cough). Pertinent negatives include no diarrhea, ear pain, nausea, plugged ear sensation or vomiting. Treatments tried: albuterol nebulizer, coricidin hbp. The treatment provided mild relief.     Problems:  Patient Active Problem List   Diagnosis Date Noted   Chronic respiratory failure with hypoxia ([place]) 08/07/2021   Venous stasis 08/07/2021   Chest pain 01/06/2021   Orthostatic hypotension 01/06/2021   Other secondary pulmonary hypertension ([place]) 66/29/4765   Diastolic dysfunction 46/50/3546   Obesity 10/25/2019   History of anemia 04/20/2019   Gastroesophageal reflux disease without esophagitis 09/09/2018   Hyperlipidemia associated with type 2 diabetes mellitus ([place]) 09/09/2018   T2DM (type 2 diabetes mellitus) ([place]) 10/28/2017   COPD (chronic obstructive pulmonary disease) ([place]) 10/28/2017   Hypertension associated with diabetes ([place])  10/28/2017   Insomnia 09/03/2016   BPH with obstruction/lower urinary tract symptoms 12/16/2015    Allergies: No Known Allergies Medications:  Current Outpatient Medications:    benzonatate (TESSALON) 100 MG capsule, Take 1 capsule (100 mg total) by mouth 3 (three) times  daily as needed., Disp: 30 capsule, Rfl: 0   molnupiravir EUA (LAGEVRIO) 200 mg CAPS capsule, Take 4 capsules (800 mg total) by mouth 2 (two) times daily for 5 days., Disp: 40 capsule, Rfl: 0   Accu-Chek Softclix Lancets lancets, Use to check blood sugar twice a day. DX E11.9, Disp: 200 each, Rfl: 2   acidophilus (RISAQUAD) CAPS capsule, Take 1 capsule by mouth daily., Disp: , Rfl:    amLODipine (NORVASC) 5 MG tablet, Take 1 tablet (5 mg total) by mouth daily., Disp: 90 tablet, Rfl: 3   ascorbic acid (VITAMIN C) 1000 MG tablet, Take 1 tablet by mouth daily., Disp: , Rfl:    aspirin EC 81 MG tablet, Take 81 mg by mouth daily., Disp: , Rfl:    Blood Glucose Monitoring Suppl (ACCU-CHEK AVIVA PLUS) w/Device KIT, Use to check blood sugar twice a day.  DX E11.9, Disp: 1 kit, Rfl: 0   BREZTRI AEROSPHERE 160-9-4.8 MCG/ACT AERO, INHALE 2 PUFFS INTO THE LUNGS IN THE MORNING AND AT BEDTIME, Disp: 32.1 g, Rfl: 1   cyclobenzaprine (FLEXERIL) 5 MG tablet, Take 1 tablet (5 mg total) by mouth at bedtime., Disp: 90 tablet, Rfl: 1   Dulaglutide (TRULICITY) 4.5 OV/5.6EP SOPN, Inject into the skin., Disp: , Rfl:    finasteride (PROSCAR) 5 MG tablet, Take 1 tablet (5 mg total) by mouth daily., Disp: 90 tablet, Rfl: 3   glucose blood (ACCU-CHEK GUIDE) test strip, Use as instructed, Disp: 100 each, Rfl: 12   insulin glargine (LANTUS SOLOSTAR) 100 UNIT/ML Solostar Pen, Inject 49 units PM, Disp: 15 mL, Rfl:    insulin lispro (HUMALOG) 100 UNIT/ML KwikPen Junior, breakfast 22 units and supper 12 units directly with food, Disp: , Rfl:    Insulin Pen Needle (PEN NEEDLES) 31G X 5 MM MISC, 1 each by Does not apply route daily as needed., Disp: 100 each, Rfl: 3   ipratropium-albuterol (DUONEB) 0.5-2.5 (3) MG/3ML SOLN, USE 3 ML VIA NEBULIZER EVERY 6 HOURS, Disp: 360 mL, Rfl: 2   Lactobacillus (PROBIOTIC ACIDOPHILUS PO), Take 1 capsule by mouth daily. , Disp: , Rfl:    metFORMIN (GLUCOPHAGE) 1000 MG tablet, Take 1 tablet (1,000 mg  total) by mouth 2 (two) times daily., Disp: 180 tablet, Rfl: 3   omeprazole (PRILOSEC) 20 MG capsule, Take 1 capsule (20 mg total) by mouth daily., Disp: 90 capsule, Rfl: 3   oxybutynin (DITROPAN-XL) 10 MG 24 hr tablet, Take 1 tablet (10 mg total) by mouth daily., Disp: 90 tablet, Rfl: 3   Potassium 99 MG TABS, Take by mouth daily. , Disp: , Rfl:    PROAIR HFA 108 (90 Base) MCG/ACT inhaler, Inhale 2 puffs into the lungs every 4 (four) hours as needed for wheezing., Disp: 18 g, Rfl: 5   ramipril (ALTACE) 10 MG capsule, Take 1 capsule (10 mg total) by mouth daily., Disp: 90 capsule, Rfl: 3   simvastatin (ZOCOR) 20 MG tablet, Take 1 tablet (20 mg total) by mouth every evening., Disp: 90 tablet, Rfl: 3   tamsulosin (FLOMAX) 0.4 MG CAPS capsule, Take 2 capsules (0.8 mg total) by mouth daily., Disp: 180 capsule, Rfl: 0   zolpidem (AMBIEN) 10 MG tablet, Take 1 tablet (10 mg total) by  mouth at bedtime., Disp: 90 tablet, Rfl: 1  Observations/Objective: Patient is well-developed, well-nourished in no acute distress.  Resting comfortably at home.  Head is normocephalic, atraumatic.  No labored breathing.  Speech is clear and coherent with logical content.  Patient is alert and oriented at baseline.    Assessment and Plan: 1. COVID-19 - molnupiravir EUA (LAGEVRIO) 200 mg CAPS capsule; Take 4 capsules (800 mg total) by mouth 2 (two) times daily for 5 days.  Dispense: 40 capsule; Refill: 0 - benzonatate (TESSALON) 100 MG capsule; Take 1 capsule (100 mg total) by mouth 3 (three) times daily as needed.  Dispense: 30 capsule; Refill: 0  - Continue OTC symptomatic management of choice - Will send OTC vitamins and supplement information through AVS - Molnupiravir prescribed - Patient enrolled in MyChart symptom monitoring - Push fluids - Rest as needed - Discussed return precautions and when to seek in-person evaluation, sent via AVS as well   Follow Up Instructions: I discussed the assessment and  treatment plan with the patient. The patient was provided an opportunity to ask questions and all were answered. The patient agreed with the plan and demonstrated an understanding of the instructions.  A copy of instructions were sent to the patient via MyChart unless otherwise noted below.    The patient was advised to call back or seek an in-person evaluation if the symptoms worsen or if the condition fails to improve as anticipated.  Time:  I spent 12 minutes with the patient via telehealth technology discussing the above problems/concerns.    Mar Daring, PA-C

## 2022-06-26 NOTE — Patient Instructions (Signed)
Leodis Binet, thank you for joining Mar Daring, PA-C for today's virtual visit.  While this provider is not your primary care provider (PCP), if your PCP is located in our provider database this encounter information will be shared with them immediately following your visit.  Consent: (Patient) Billy Wise provided verbal consent for this virtual visit at the beginning of the encounter.  Current Medications:  Current Outpatient Medications:    benzonatate (TESSALON) 100 MG capsule, Take 1 capsule (100 mg total) by mouth 3 (three) times daily as needed., Disp: 30 capsule, Rfl: 0   molnupiravir EUA (LAGEVRIO) 200 mg CAPS capsule, Take 4 capsules (800 mg total) by mouth 2 (two) times daily for 5 days., Disp: 40 capsule, Rfl: 0   Accu-Chek Softclix Lancets lancets, Use to check blood sugar twice a day. DX E11.9, Disp: 200 each, Rfl: 2   acidophilus (RISAQUAD) CAPS capsule, Take 1 capsule by mouth daily., Disp: , Rfl:    amLODipine (NORVASC) 5 MG tablet, Take 1 tablet (5 mg total) by mouth daily., Disp: 90 tablet, Rfl: 3   ascorbic acid (VITAMIN C) 1000 MG tablet, Take 1 tablet by mouth daily., Disp: , Rfl:    aspirin EC 81 MG tablet, Take 81 mg by mouth daily., Disp: , Rfl:    Blood Glucose Monitoring Suppl (ACCU-CHEK AVIVA PLUS) w/Device KIT, Use to check blood sugar twice a day.  DX E11.9, Disp: 1 kit, Rfl: 0   BREZTRI AEROSPHERE 160-9-4.8 MCG/ACT AERO, INHALE 2 PUFFS INTO THE LUNGS IN THE MORNING AND AT BEDTIME, Disp: 32.1 g, Rfl: 1   cyclobenzaprine (FLEXERIL) 5 MG tablet, Take 1 tablet (5 mg total) by mouth at bedtime., Disp: 90 tablet, Rfl: 1   Dulaglutide (TRULICITY) 4.5 VP/7.1GG SOPN, Inject into the skin., Disp: , Rfl:    finasteride (PROSCAR) 5 MG tablet, Take 1 tablet (5 mg total) by mouth daily., Disp: 90 tablet, Rfl: 3   glucose blood (ACCU-CHEK GUIDE) test strip, Use as instructed, Disp: 100 each, Rfl: 12   insulin glargine (LANTUS SOLOSTAR) 100 UNIT/ML Solostar  Pen, Inject 49 units PM, Disp: 15 mL, Rfl:    insulin lispro (HUMALOG) 100 UNIT/ML KwikPen Junior, breakfast 22 units and supper 12 units directly with food, Disp: , Rfl:    Insulin Pen Needle (PEN NEEDLES) 31G X 5 MM MISC, 1 each by Does not apply route daily as needed., Disp: 100 each, Rfl: 3   ipratropium-albuterol (DUONEB) 0.5-2.5 (3) MG/3ML SOLN, USE 3 ML VIA NEBULIZER EVERY 6 HOURS, Disp: 360 mL, Rfl: 2   Lactobacillus (PROBIOTIC ACIDOPHILUS PO), Take 1 capsule by mouth daily. , Disp: , Rfl:    metFORMIN (GLUCOPHAGE) 1000 MG tablet, Take 1 tablet (1,000 mg total) by mouth 2 (two) times daily., Disp: 180 tablet, Rfl: 3   omeprazole (PRILOSEC) 20 MG capsule, Take 1 capsule (20 mg total) by mouth daily., Disp: 90 capsule, Rfl: 3   oxybutynin (DITROPAN-XL) 10 MG 24 hr tablet, Take 1 tablet (10 mg total) by mouth daily., Disp: 90 tablet, Rfl: 3   Potassium 99 MG TABS, Take by mouth daily. , Disp: , Rfl:    PROAIR HFA 108 (90 Base) MCG/ACT inhaler, Inhale 2 puffs into the lungs every 4 (four) hours as needed for wheezing., Disp: 18 g, Rfl: 5   ramipril (ALTACE) 10 MG capsule, Take 1 capsule (10 mg total) by mouth daily., Disp: 90 capsule, Rfl: 3   simvastatin (ZOCOR) 20 MG tablet, Take 1 tablet (20 mg  total) by mouth every evening., Disp: 90 tablet, Rfl: 3   tamsulosin (FLOMAX) 0.4 MG CAPS capsule, Take 2 capsules (0.8 mg total) by mouth daily., Disp: 180 capsule, Rfl: 0   zolpidem (AMBIEN) 10 MG tablet, Take 1 tablet (10 mg total) by mouth at bedtime., Disp: 90 tablet, Rfl: 1   Medications ordered in this encounter:  Meds ordered this encounter  Medications   molnupiravir EUA (LAGEVRIO) 200 mg CAPS capsule    Sig: Take 4 capsules (800 mg total) by mouth 2 (two) times daily for 5 days.    Dispense:  40 capsule    Refill:  0    Order Specific Question:   Supervising Provider    Answer:   MILLER, BRIAN [[id number]]   benzonatate (TESSALON) 100 MG capsule    Sig: Take 1 capsule (100 mg total) by  mouth 3 (three) times daily as needed.    Dispense:  30 capsule    Refill:  0    Order Specific Question:   Supervising Provider    Answer:   Sabra Heck, [place]     *If you need refills on other medications prior to your next appointment, please contact your pharmacy*  Follow-Up: Call back or seek an in-person evaluation if the symptoms worsen or if the condition fails to improve as anticipated.  Other Instructions Molnupiravir Capsules What is this medication? MOLNUPIRAVIR (MOL nue PIR a vir) treats mild to moderate COVID-19. It may help people who are at high risk of developing severe illness. This medication works by limiting the spread of the virus in your body. The FDA has allowed the emergency use of this medication. This medicine may be used for other purposes; ask your health care provider or pharmacist if you have questions. COMMON BRAND NAME(S): LAGEVRIO What should I tell my care team before I take this medication? They need to know if you have any of these conditions: Any allergies Any serious illness An unusual or allergic reaction to molnupiravir, other medications, foods, dyes, or preservatives Pregnant or trying to get pregnant Breast-feeding How should I use this medication? Take this medication by mouth with water. Take it as directed on the prescription label at the same time every day. Do not cut, crush, or chew this medication. Swallow the capsules whole. You can take it with or without food. If it upsets your stomach, take it with food. Take all of it unless your care team tells you to stop it early. Keep taking it even if you think you are better. Talk to your care team about the use of this medication in children. Special care may be needed. Overdosage: If you think you have taken too much of this medicine contact a poison control center or emergency room at once. NOTE: This medicine is only for you. Do not share this medicine with others. What if I miss a  dose? If you miss a dose, take it as soon as you can unless it is more than 10 hours late. If it is more than 10 hours late, skip the missed dose. Take the next dose at the normal time. Do not take extra or 2 doses at the same time to make up for the missed dose. What may interact with this medication? Interactions have not been studied. This list may not describe all possible interactions. Give your health care provider a list of all the medicines, herbs, non-prescription drugs, or dietary supplements you use. Also tell them if you smoke, drink alcohol,  or use illegal drugs. Some items may interact with your medicine. What should I watch for while using this medication? Your condition will be monitored carefully while you are receiving this medication. Visit your care team for regular checkups. Tell your care team if your symptoms do not start to get better or if they get worse. Do not become pregnant while taking this medication. You may need a pregnancy test before starting this medication. Women must use a reliable form of birth control while taking this medication and for 4 days after stopping the medication. Women should inform their care team if they wish to become pregnant or think they might be pregnant. Men should not father a child while taking this medication and for 3 months after stopping it. There is potential for serious harm to an unborn child. Talk to your care team for more information. Do not breast-feed an infant while taking this medication and for 4 days after stopping the medication. What side effects may I notice from receiving this medication? Side effects that you should report to your care team as soon as possible: Allergic reactions--skin rash, itching, hives, swelling of the face, lips, tongue, or throat Side effects that usually do not require medical attention (report these to your care team if they continue or are bothersome): Diarrhea Dizziness Nausea This list may  not describe all possible side effects. Call your doctor for medical advice about side effects. You may report side effects to FDA at 1-800-FDA-1088. Where should I keep my medication? Keep out of the reach of children and pets. Store at room temperature between 20 and 25 degrees C (68 and 77 degrees F). Get rid of any unused medication after the expiration date. To get rid of medications that are no longer needed or have expired: Take the medication to a medication take-back program. Check with your pharmacy or law enforcement to find a location. If you cannot return the medication, check the label or package insert to see if the medication should be thrown out in the garbage or flushed down the toilet. If you are not sure, ask your care team. If it is safe to put it in the trash, take the medication out of the container. Mix the medication with cat litter, dirt, coffee grounds, or other unwanted substance. Seal the mixture in a bag or container. Put it in the trash. NOTE: This sheet is a summary. It may not cover all possible information. If you have questions about this medicine, talk to your doctor, pharmacist, or health care provider.  2023 Elsevier/Gold Standard (2020-10-14 00:00:00)    If you have been instructed to have an in-person evaluation today at a local Urgent Care facility, please use the link below. It will take you to a list of all of our available [facility], including address, phone number and hours of operation. Please do not delay care.  [facility]  If you or a family member do not have a primary care provider, use the link below to schedule a visit and establish care. When you choose a [facility] primary care physician or advanced practice provider, you gain a long-term partner in health. Find a Primary Care Provider  Learn more about [facility]'s in-office and virtual care options: [facility]

## 2022-06-26 NOTE — Telephone Encounter (Signed)
  Chief Complaint: COVID positive Symptoms: runny nose, cough, SOB at times, low grade fever Frequency: started yesterday Pertinent Negatives: NA Disposition: [] ED /[] Urgent Care (no appt availability in office) / [] Appointment(In office/virtual)/ [x]  [facility]/ [] Home Care/ [] Refused Recommended Disposition /[]  Mobile Bus/ []  Follow-up with PCP Additional Notes: pt preferred to go ahead and get medication since he has COPD and having to use neb tx q4-6h. Scheduled pt for virtual UC today at 1615. Pt accessed mychart and was able to confirm appt.   Summary: Covid positive   The patient called in stating he just tested positive for covid. His wife tested positive about 2 weeks ago. He has a runny nose, some shortness of breath and a cough. He also has COPD. He has been scheduled for virtual appt with at 9 on Monday 9/11 but he requested medicine or some type of antibiotic to start as soon as possible. Please assist patient further      Reason for Disposition  [1] HIGH RISK patient (e.g., weak immune system, age > 64 years, obesity with BMI 30 or higher, pregnant, chronic lung disease or other chronic medical condition) AND [2] COVID symptoms (e.g., cough, fever)  (Exceptions: Already seen by PCP and no new or worsening symptoms.)  Answer Assessment - Initial Assessment Questions 1. COVID-19 DIAGNOSIS: "How do you know that you have COVID?" (e.g., positive lab test or self-test, diagnosed by doctor or NP/PA, symptoms after exposure).     Home test, today  3. ONSET: "When did the COVID-19 symptoms start?"      yesterday 5. COUGH: "Do you have a cough?" If Yes, ask: "How bad is the cough?"       yes 6. FEVER: "Do you have a fever?" If Yes, ask: "What is your temperature, how was it measured, and when did it start?"     Low grade fever  7. RESPIRATORY STATUS: "Describe your breathing?" (e.g., normal; shortness of breath, wheezing, unable to speak)      SOB  at times 9. OTHER SYMPTOMS: "Do you have any other symptoms?"  (e.g., chills, fatigue, headache, loss of smell or taste, muscle pain, sore throat)     Runny nose, chest tightness 10. HIGH RISK DISEASE: "Do you have any chronic medical problems?" (e.g., asthma, heart or lung disease, weak immune system, obesity, etc.)       COPD, HTN  Protocols used: Coronavirus (COVID-19) Diagnosed or Suspected-A-AH

## 2022-06-29 ENCOUNTER — Telehealth: Payer: Medicare PPO | Admitting: Physician Assistant

## 2022-07-18 ENCOUNTER — Other Ambulatory Visit: Payer: Self-pay | Admitting: Family Medicine

## 2022-07-20 ENCOUNTER — Telehealth: Payer: Self-pay | Admitting: Family Medicine

## 2022-07-20 MED ORDER — METFORMIN HCL 1000 MG PO TABS: 1000.0000 mg | ORAL_TABLET | Freq: Two times a day (BID) | ORAL | 0 refills | Status: DC

## 2022-07-20 NOTE — Telephone Encounter (Signed)
Requested medications are due for refill today.  yes  Requested medications are on the active medications list.  yes  Last refill. 08/07/2021 #90 3 rf  Future visit scheduled.   yes  Notes to clinic.  Labs are expired    Requested Prescriptions  Pending Prescriptions Disp Refills   simvastatin (ZOCOR) 20 MG tablet [Pharmacy Med Name: SIMVASTATIN 20MG  TABLETS] 90 tablet 3    Sig: TAKE 1 TABLET(20 MG) BY MOUTH EVERY EVENING     Cardiovascular:  Antilipid - Statins Failed - 07/18/2022  6:33 AM      Failed - Lipid Panel in normal range within the last 12 months    Cholesterol, Total  Date Value Ref Range Status  08/05/2020 130 100 - 199 mg/dL Final   Cholesterol  Date Value Ref Range Status  05/26/2021 116 0 - 200 Final   LDL Chol Calc (NIH)  Date Value Ref Range Status  08/05/2020 69 0 - 99 mg/dL Final   LDL Cholesterol  Date Value Ref Range Status  05/26/2021 50  Final   HDL  Date Value Ref Range Status  05/26/2021 25 (A) 35 - 70 Final  08/05/2020 26 (L) >39 mg/dL Final   Triglycerides  Date Value Ref Range Status  05/26/2021 256 (A) 40 - 160 Final         Passed - Patient is not pregnant      Passed - Valid encounter within last 12 months    Recent Outpatient Visits           11 months ago Encounter for annual wellness visit (AWV) in Medicare patient   07/26/2021, [facility], MD   1 year ago Orthostatic hypotension   [facility] [place], [place], MD   1 year ago Annual physical exam   [facility] M, M   2 years ago COPD exacerbation [facility])   [facility] [place], [place], [place]   2 years ago Essential hypertension   [facility] Bacigalupo, [place], MD       Future Appointments             In 3 weeks Bacigalupo, Marzella Schlein, MD [facility]            Signed Prescriptions Disp Refills   oxybutynin (DITROPAN-XL) 10 MG 24  hr tablet 90 tablet 0    Sig: TAKE 1 TABLET(10 MG) BY MOUTH DAILY     Urology:  Bladder Agents Passed - 07/18/2022  6:33 AM      Passed - Valid encounter within last 12 months    Recent Outpatient Visits           11 months ago Encounter for annual wellness visit (AWV) in Medicare patient   [facility] [place], [place], [place]   1 year ago Orthostatic hypotension   [facility] [place], [place], MD   1 year ago Annual physical exam   [facility] M, M   2 years ago COPD exacerbation [facility])   [facility] [place], [place], [place]   2 years ago Essential hypertension   [facility] Bacigalupo, [place], MD       Future Appointments             In 3 weeks Bacigalupo, Marzella Schlein, MD [facility], [facility]             omeprazole (PRILOSEC) 20 MG  capsule 90 capsule 0    Sig: TAKE 1 CAPSULE(20 MG) BY MOUTH DAILY     Gastroenterology: Proton Pump Inhibitors Passed - 07/18/2022  6:33 AM      Passed - Valid encounter within last 12 months    Recent Outpatient Visits           11 months ago Encounter for annual wellness visit (AWV) in Medicare patient   [facility] [place], Dionne Bucy, MD   1 year ago Orthostatic hypotension   [facility] [place], Dionne Bucy, MD   1 year ago Annual physical exam   [facility] [place], [place]   2 years ago COPD exacerbation [facility])   [facility] [place], Wendee Beavers, [place]   2 years ago Essential hypertension   [place], Dionne Bucy, MD       Future Appointments             In 3 weeks Bacigalupo, Dionne Bucy, MD [facility], [place]

## 2022-07-20 NOTE — Telephone Encounter (Signed)
[facility] faxed refill request for the following medications:   metFORMIN (GLUCOPHAGE) 1000 MG tablet     Please advise.  

## 2022-07-20 NOTE — Telephone Encounter (Signed)
Requested Prescriptions  Pending Prescriptions Disp Refills  . oxybutynin (DITROPAN-XL) 10 MG 24 hr tablet [Pharmacy Med Name: OXYBUTYNIN ER 10MG  TABLETS] 90 tablet 0    Sig: TAKE 1 TABLET(10 MG) BY MOUTH DAILY     Urology:  Bladder Agents Passed - 07/18/2022  6:33 AM      Passed - Valid encounter within last 12 months    Recent Outpatient Visits          11 months ago Encounter for annual wellness visit (AWV) in Medicare patient   [facility] [place], [place], MD   1 year ago Orthostatic hypotension   [facility] [place], [place], MD   1 year ago Annual physical exam   [facility] M, M   2 years ago COPD exacerbation [facility])   [facility] [place], [place], Lavella [place]   2 years ago Essential hypertension   [facility] Bacigalupo, [place], MD      Future Appointments            In 3 weeks Bacigalupo, Marzella Schlein, MD [facility], [facility]           . omeprazole (PRILOSEC) 20 MG capsule [Pharmacy Med Name: OMEPRAZOLE 20MG  CAPSULES] 90 capsule 0    Sig: TAKE 1 CAPSULE(20 MG) BY MOUTH DAILY     Gastroenterology: Proton Pump Inhibitors Passed - 07/18/2022  6:33 AM      Passed - Valid encounter within last 12 months    Recent Outpatient Visits          11 months ago Encounter for annual wellness visit (AWV) in Medicare patient   , 07/20/2022, MD   1 year ago Orthostatic hypotension   [facility] [place], [facility], MD   1 year ago Annual physical exam   [facility] Marzella Schlein M, [place] Man   2 years ago COPD exacerbation [facility])   [facility] [place], [facility], [place]   2 years ago Essential hypertension   [facility] Bacigalupo, Lavella [place], MD      Future Appointments            In 3 weeks Bacigalupo, [place], MD [facility], [facility]           . simvastatin  (ZOCOR) 20 MG tablet [Pharmacy Med Name: SIMVASTATIN 20MG  TABLETS] 90 tablet 3    Sig: TAKE 1 TABLET(20 MG) BY MOUTH EVERY EVENING     Cardiovascular:  Antilipid - Statins Failed - 07/18/2022  6:33 AM      Failed - Lipid Panel in normal range within the last 12 months    Cholesterol, Total  Date Value Ref Range Status  08/05/2020 130 100 - 199 mg/dL Final   Cholesterol  Date Value Ref Range Status  05/26/2021 116 0 - 200 Final   LDL Chol Calc (NIH)  Date Value Ref Range Status  08/05/2020 69 0 - 99 mg/dL Final   LDL Cholesterol  Date Value Ref Range Status  05/26/2021 50  Final   HDL  Date Value Ref Range Status  05/26/2021 25 (A) 35 - 70 Final  08/05/2020 26 (L) >39 mg/dL Final   Triglycerides  Date Value Ref Range Status  05/26/2021 256 (A) 40 - 160 Final         Passed - Patient is not pregnant      Passed - Valid encounter within last 12 months  Recent Outpatient Visits          11 months ago Encounter for annual wellness visit (AWV) in Medicare patient   [facility] Dionne Bucy, [place]   1 year ago Orthostatic hypotension   [facility] [place], Dionne Bucy, MD   1 year ago Annual physical exam   [facility] [place] M, [place]   2 years ago COPD exacerbation [facility])   [facility], Wendee Beavers, [place]   2 years ago Essential hypertension   [place], Dionne Bucy, MD      Future Appointments            In 3 weeks Bacigalupo, Dionne Bucy, MD [facility], [place]

## 2022-07-26 ENCOUNTER — Other Ambulatory Visit: Payer: Self-pay | Admitting: Family Medicine

## 2022-07-27 ENCOUNTER — Telehealth: Payer: Self-pay | Admitting: Family Medicine

## 2022-07-27 MED ORDER — FINASTERIDE 5 MG PO TABS: 5.0000 mg | ORAL_TABLET | Freq: Every day | ORAL | 3 refills | Status: DC

## 2022-07-27 NOTE — Telephone Encounter (Signed)
[facility] faxed refill request for the following medications:  finasteride (PROSCAR) 5 MG tablet   Please advise.

## 2022-07-27 NOTE — Addendum Note (Signed)
Addended by: Virginia Crews on: 07/27/2022 11:32 AM   Modules accepted: Orders

## 2022-08-05 ENCOUNTER — Other Ambulatory Visit: Payer: Self-pay

## 2022-08-05 DIAGNOSIS — I10 Essential (primary) hypertension: Secondary | ICD-10-CM

## 2022-08-05 MED ORDER — AMLODIPINE BESYLATE 5 MG PO TABS: 5.0000 mg | ORAL_TABLET | Freq: Every day | ORAL | 0 refills | Status: DC

## 2022-08-07 NOTE — Progress Notes (Signed)
Complete physical exam   Patient: Billy Wise   DOB: [birth year]   76 y.o. Male  MRN: [id number] Visit Date: 08/14/2022  Today's healthcare provider: Gwyneth Sprout, FNP  Introduced to nurse practitioner role and practice setting.  All questions answered.  Discussed provider/patient relationship and expectations.   I,Tiffany J Bragg,acting as a scribe for Gwyneth Sprout, FNP.,have documented all relevant documentation on the behalf of Gwyneth Sprout, FNP,as directed by  Gwyneth Sprout, FNP while in the presence of Gwyneth Sprout, FNP.   Chief Complaint  Patient presents with   Annual Exam   Subjective    Billy Wise is a 76 y.o. male who presents today for a complete physical exam.  He reports consuming a diabetic, low calorie diet. Home exercise routine includes 30 minutes 3x/week, treadmill, bike and weights. He generally feels fairly well. He reports sleeping poorly. He does not have additional problems to discuss today.  HPI   Past Medical History:  Diagnosis Date   Actinic keratoses    BPH (benign prostatic hyperplasia)    COPD (chronic obstructive pulmonary disease) (HCC)    Diabetes mellitus without complication (HCC)    GERD (gastroesophageal reflux disease)    Hypertension    Insomnia    Iron deficiency anemia    Mixed hyperlipidemia    Oxygen deficiency    Past Surgical History:  Procedure Laterality Date   CARDIAC CATHETERIZATION     CHOLECYSTECTOMY     COLONOSCOPY WITH PROPOFOL N/A 10/18/2018   Procedure: COLONOSCOPY WITH PROPOFOL;  Surgeon: Lucilla Lame, MD;  Location: [facility] ENDOSCOPY;  Service: Endoscopy;  Laterality: N/A;   HERNIA REPAIR     Social History   Socioeconomic History   Marital status: Married    Spouse name: Not on file   Number of children: 2   Years of education: Not on file   Highest education level: High school graduate  Occupational History   Occupation: Software engineer of [employer]    Comment: full time  Tobacco Use    Smoking status: Former    Packs/day: 1.50    Years: 50.00    Total pack years: 75.00    Types: Cigarettes    Quit date: 09/29/2008    Years since quitting: 13.8   Smokeless tobacco: Never  Vaping Use   Vaping Use: Never used  Substance and Sexual Activity   Alcohol use: Yes    Comment: 2 drinks a month / cocktail   Drug use: No   Sexual activity: Yes    Partners: Female    Birth control/protection: None  Other Topics Concern   Not on file  Social History Narrative   Not on file   Social Determinants of Health   Financial Resource Strain: Low Risk  (05/06/2020)   Overall Financial Resource Strain (CARDIA)    Difficulty of Paying Living Expenses: Not hard at all  Food Insecurity: No Food Insecurity (05/06/2020)   Hunger Vital Sign    Worried About Running Out of Food in the Last Year: Never true    [place] in the Last Year: Never true  Transportation Needs: No Transportation Needs (05/06/2020)   PRAPARE - Hydrologist (Medical): No    Lack of Transportation (Non-Medical): No  Physical Activity: Sufficiently Active (05/06/2020)   Exercise Vital Sign    Days of Exercise per Week: 4 days    Minutes of Exercise per Session: 40 min  Stress: No Stress Concern Present (05/06/2020)   [place]    Feeling of Stress : Not at all  Social Connections: Moderately Integrated (05/06/2020)   Social Connection and Isolation Panel [NHANES]    Frequency of Communication with Friends and Family: More than three times a week    Frequency of Social Gatherings with Friends and Family: More than three times a week    Attends Religious Services: More than 4 times per year    Active Member of [employer] or Organizations: No    Attends Archivist Meetings: Never    Marital Status: Married  Human resources officer Violence: Not At Risk (05/06/2020)   Humiliation, Afraid, Rape, and Kick questionnaire     Fear of Current or Ex-Partner: No    Emotionally Abused: No    Physically Abused: No    Sexually Abused: No   Family Status  Relation Name Status   Sister  (Not Specified)   Mother  Deceased at age 38   Father  Deceased at age 7       died of anesthesia complications   Family History  Problem Relation Age of Onset   Heart attack Sister    Hypertension Sister    Gout Father    No Known Allergies  Patient Care Team: Bacigalupo, Dionne Bucy, MD as PCP - General (Family Medicine) Jyl Heinz, MD as Referring Physician (Endocrinology) Crosby Oyster, MD as Referring Physician (Dermatology) Leandrew Koyanagi, MD as Referring Physician (Ophthalmology)   Medications: Outpatient Medications Prior to Visit  Medication Sig   Accu-Chek Softclix Lancets lancets Use to check blood sugar twice a day. DX E11.9   acidophilus (RISAQUAD) CAPS capsule Take 1 capsule by mouth daily.   ascorbic acid (VITAMIN C) 1000 MG tablet Take 1 tablet by mouth daily.   Blood Glucose Monitoring Suppl (ACCU-CHEK AVIVA PLUS) w/Device KIT Use to check blood sugar twice a day.  DX E11.9   BREZTRI AEROSPHERE 160-9-4.8 MCG/ACT AERO INHALE 2 PUFFS INTO THE LUNGS IN THE MORNING AND AT BEDTIME   Dulaglutide (TRULICITY) 4.5 PF/7.9KW SOPN Inject into the skin.   finasteride (PROSCAR) 5 MG tablet Take 1 tablet (5 mg total) by mouth daily.   glucose blood (ACCU-CHEK GUIDE) test strip Use as instructed   insulin lispro (HUMALOG) 100 UNIT/ML KwikPen Junior breakfast 22 units and supper 12 units directly with food   Insulin Pen Needle (PEN NEEDLES) 31G X 5 MM MISC 1 each by Does not apply route daily as needed.   ipratropium-albuterol (DUONEB) 0.5-2.5 (3) MG/3ML SOLN USE 3 ML VIA NEBULIZER EVERY 6 HOURS   Lactobacillus (PROBIOTIC ACIDOPHILUS PO) Take 1 capsule by mouth daily.    metFORMIN (GLUCOPHAGE) 1000 MG tablet Take 1 tablet (1,000 mg total) by mouth 2 (two) times daily.   omeprazole (PRILOSEC) 20 MG  capsule TAKE 1 CAPSULE(20 MG) BY MOUTH DAILY   oxybutynin (DITROPAN-XL) 10 MG 24 hr tablet TAKE 1 TABLET(10 MG) BY MOUTH DAILY   PROAIR HFA 108 (90 Base) MCG/ACT inhaler Inhale 2 puffs into the lungs every 4 (four) hours as needed for wheezing.   ramipril (ALTACE) 10 MG capsule Take 1 capsule (10 mg total) by mouth daily.   simvastatin (ZOCOR) 20 MG tablet TAKE 1 TABLET(20 MG) BY MOUTH EVERY EVENING   tamsulosin (FLOMAX) 0.4 MG CAPS capsule Take 2 capsules (0.8 mg total) by mouth daily.   zolpidem (AMBIEN) 10 MG tablet TAKE 1 TABLET(10 MG) BY  MOUTH AT BEDTIME   [DISCONTINUED] amLODipine (NORVASC) 5 MG tablet Take 1 tablet (5 mg total) by mouth daily. Please schedule office visit before any future refill.   [DISCONTINUED] benzonatate (TESSALON) 100 MG capsule Take 1 capsule (100 mg total) by mouth 3 (three) times daily as needed.   [DISCONTINUED] cyclobenzaprine (FLEXERIL) 5 MG tablet Take 1 tablet (5 mg total) by mouth at bedtime.   [DISCONTINUED] insulin glargine (LANTUS SOLOSTAR) 100 UNIT/ML Solostar Pen Inject 49 units PM   [DISCONTINUED] aspirin EC 81 MG tablet Take 81 mg by mouth daily.   [DISCONTINUED] Potassium 99 MG TABS Take by mouth daily.    No facility-administered medications prior to visit.    Review of Systems  Eyes:  Positive for itching.  Respiratory:  Positive for shortness of breath.   Gastrointestinal:  Positive for abdominal distention and diarrhea.    Objective    BP 127/77 (BP Location: Right Arm, Patient Position: Sitting, Cuff Size: Normal)   Pulse (!) 106   Resp 16   Ht '5\' 5"'  (1.651 m)   Wt 192 lb (87.1 kg)   SpO2 90% Comment: on room air, supposed to be on 02  BMI 31.95 kg/m   Physical Exam Vitals and nursing note reviewed.  Constitutional:      General: He is awake. He is not in acute distress.    Appearance: Normal appearance. He is well-developed and well-groomed. He is obese. He is not ill-appearing, toxic-appearing or diaphoretic.  HENT:      Head: Normocephalic and atraumatic.     Jaw: There is normal jaw occlusion. No trismus, tenderness, swelling or pain on movement.     Salivary Glands: Right salivary gland is not diffusely enlarged or tender. Left salivary gland is not diffusely enlarged or tender.     Right Ear: Hearing, tympanic membrane, ear canal and external ear normal. There is no impacted cerumen.     Left Ear: Hearing, tympanic membrane, ear canal and external ear normal. There is no impacted cerumen.     Nose: Nose normal. No congestion or rhinorrhea.     Right Turbinates: Not enlarged, swollen or pale.     Left Turbinates: Not enlarged, swollen or pale.     Right Sinus: No maxillary sinus tenderness or frontal sinus tenderness.     Left Sinus: No maxillary sinus tenderness or frontal sinus tenderness.     Mouth/Throat:     Lips: Pink.     Mouth: Mucous membranes are moist. No injury, lacerations, oral lesions or angioedema.     Pharynx: Oropharynx is clear. Uvula midline. No pharyngeal swelling, oropharyngeal exudate or posterior oropharyngeal erythema.     Tonsils: No tonsillar exudate or tonsillar abscesses.  Eyes:     General: Lids are normal. Vision grossly intact. Gaze aligned appropriately.        Right eye: No discharge.        Left eye: No discharge.     Extraocular Movements: Extraocular movements intact.     Conjunctiva/sclera: Conjunctivae normal.     Pupils: Pupils are equal, round, and reactive to light.  Neck:     Thyroid: No thyroid mass, thyromegaly or thyroid tenderness.     Vascular: No carotid bruit.     Trachea: Trachea normal. No tracheal tenderness.  Cardiovascular:     Rate and Rhythm: Normal rate and regular rhythm.     Pulses: Normal pulses.          Carotid pulses are 2+ on the right  side and 2+ on the left side.      Radial pulses are 2+ on the right side and 2+ on the left side.       Femoral pulses are 2+ on the right side and 2+ on the left side.      Popliteal pulses are 2+ on  the right side and 2+ on the left side.       Dorsalis pedis pulses are 2+ on the right side and 2+ on the left side.       Posterior tibial pulses are 2+ on the right side and 2+ on the left side.     Heart sounds: Normal heart sounds, S1 normal and S2 normal. No murmur heard.    No friction rub. No gallop.  Pulmonary:     Effort: Pulmonary effort is normal. No respiratory distress.     Breath sounds: Normal breath sounds and air entry. No stridor. No wheezing, rhonchi or rales.     Comments: Barrel shaped chest; encouraged O2 use continuously to prevent worsening hypoxemia  Chest:     Chest wall: No tenderness.  Abdominal:     General: Abdomen is flat. Bowel sounds are normal. There is no distension.     Palpations: Abdomen is soft. There is no mass.     Tenderness: There is no abdominal tenderness. There is no guarding or rebound.     Hernia: No hernia is present.     Comments: BS WDL; likely loose Bms s/s metformin use  Genitourinary:    Comments: Exam deferred; endorses LUTS w/nocturia Repeat PSA Musculoskeletal:        General: No swelling, tenderness, deformity or signs of injury. Normal range of motion.     Cervical back: Normal range of motion and neck supple. No rigidity or tenderness.     Right lower leg: No edema.     Left lower leg: No edema.  Lymphadenopathy:     Cervical: No cervical adenopathy.     Right cervical: No superficial, deep or posterior cervical adenopathy.    Left cervical: No superficial, deep or posterior cervical adenopathy.  Skin:    General: Skin is warm and dry.     Capillary Refill: Capillary refill takes less than 2 seconds.     Coloration: Skin is not jaundiced or pale.     Findings: No bruising, erythema, lesion or rash.  Neurological:     General: No focal deficit present.     Mental Status: He is alert and oriented to person, place, and time. Mental status is at baseline.     GCS: GCS eye subscore is 4. GCS verbal subscore is 5. GCS motor  subscore is 6.     Sensory: Sensation is intact. No sensory deficit.     Motor: Motor function is intact. No weakness.     Coordination: Coordination is intact.     Gait: Gait is intact.  Psychiatric:        Attention and Perception: Attention and perception normal.        Mood and Affect: Mood and affect normal.        Speech: Speech normal.        Behavior: Behavior normal. Behavior is cooperative.        Thought Content: Thought content normal.        Cognition and Memory: Cognition normal.        Judgment: Judgment normal.      Last depression screening scores  08/14/2022    8:18 AM 08/07/2021    9:13 AM 05/06/2020    3:33 PM  PHQ 2/9 Scores  PHQ - 2 Score 0 0 0  PHQ- 9 Score 0 3    Last fall risk screening    08/14/2022    8:18 AM  [place] in the past year? 0  Number falls in past yr: 0  Injury with Fall? 0  Risk for fall due to : No Fall Risks  Follow up Falls evaluation completed   Last Audit-C alcohol use screening    08/14/2022    8:18 AM  Alcohol Use Disorder Test (AUDIT)  1. How often do you have a drink containing alcohol? 2  2. How many drinks containing alcohol do you have on a typical day when you are drinking? 0  3. How often do you have six or more drinks on one occasion? 0  AUDIT-C Score 2   A score of 3 or more in women, and 4 or more in men indicates increased risk for alcohol abuse, EXCEPT if all of the points are from question 1   No results found for any visits on 08/14/22.  Assessment & Plan    Routine Health Maintenance and Physical Exam  Exercise Activities and Dietary recommendations  Goals      DIET - REDUCE PORTION SIZE     Recommend to decrease portion sizes by eating 3 small healthy meals and at least 2 healthy snacks per day.        Immunization History  Administered Date(s) Administered   Fluad Quad(high Dose 65+) 08/05/2020   Influenza, High Dose Seasonal PF 07/24/2015, 07/21/2016, 08/02/2017, 08/06/2017,  07/27/2018   Influenza,inj,Quad PF,6+ Mos 06/27/2019   Influenza-Unspecified 09/14/2012, 07/19/2021   Moderna SARS-COV2 Booster Vaccination 07/06/2021   PFIZER(Purple Top)SARS-COV-2 Vaccination 11/10/2019, 12/01/2019, 07/14/2020   Pfizer Covid-19 Vaccine Bivalent Booster 70yr & up 01/26/2021   Pneumococcal Conjugate-13 08/25/2014   Pneumococcal Polysaccharide-23 01/26/2016   Pneumococcal-Unspecified 12/17/2005   Tdap 12/17/2005, 02/25/2016   Zoster Recombinat (Shingrix) 05/16/2019   Zoster, Live 11/24/2013    Health Maintenance  Topic Date Due   HEMOGLOBIN A1C  11/26/2021   Diabetic kidney evaluation - GFR measurement  06/24/2022   FOOT EXAM  08/07/2022   COVID-19 Vaccine (5 - Pfizer risk series) 08/30/2022 (Originally 08/31/2021)   Zoster Vaccines- Shingrix (2 of 2) 11/14/2022 (Originally 07/11/2019)   INFLUENZA VACCINE  01/17/2023 (Originally 05/19/2022)   Lung Cancer Screening  10/07/2022   OPHTHALMOLOGY EXAM  01/13/2023   Diabetic kidney evaluation - Urine ACR  01/28/2023   Medicare Annual Wellness (AWV)  08/15/2023   TETANUS/TDAP  02/24/2026   COLONOSCOPY (Pts 45-455yrInsurance coverage will need to be confirmed)  10/18/2028   Pneumonia Vaccine 6536Years old  Completed   Hepatitis C Screening  Completed   HPV VACCINES  Aged Out    Discussed health benefits of physical activity, and encouraged him to engage in regular exercise appropriate for his age and condition.  Problem List Items Addressed This Visit       Cardiovascular and Mediastinum   Hypertension associated with diabetes (HCDaphne   Chronic, stable Continue Norvasc 5 Continue Ramipril 10 mg      Relevant Medications   amLODipine (NORVASC) 5 MG tablet   insulin glargine (LANTUS SOLOSTAR) 100 UNIT/ML Solostar Pen   Other Relevant Orders   CBC with Differential/Platelet   Comprehensive Metabolic Panel (CMET)   Senile purpura (HCC)  Acute on chronic, has stopped ASA use       Relevant Medications    amLODipine (NORVASC) 5 MG tablet   Other Relevant Orders   CBC with Differential/Platelet     Respiratory   COPD (chronic obstructive pulmonary disease) (HCC)    Chronic, stable Reports of DOE w/o O2 On 2LPM; however, not using today in office         Endocrine   Diabetes mellitus with proteinuria (HCC)    Chronic, repeat A1c Currently using 22/12 lispro and 53 lantus  Followed by endo       Relevant Medications   insulin glargine (LANTUS SOLOSTAR) 100 UNIT/ML Solostar Pen   Other Relevant Orders   Urine Microalbumin w/creat. ratio     Genitourinary   BPH with obstruction/lower urinary tract symptoms    Repeat PSA; reports poor sleeping patterns related to nocturia       Relevant Orders   PSA     Other   Annual physical exam - Primary    UTD on vision and dental Continues to work Monday-Thursday 9a-3p      Relevant Orders   CBC with Differential/Platelet   Comprehensive Metabolic Panel (CMET)   Encounter for subsequent annual wellness visit (AWV) in Medicare patient    Things to do to keep yourself healthy  - Exercise at least 30-45 minutes a day, 3-4 days a week.  - Eat a low-fat diet with lots of fruits and vegetables, up to 7-9 servings per day.  - Seatbelts can save your life. Wear them always.  - Smoke detectors on every level of your home, check batteries every year.  - Eye Doctor - have an eye exam every 1-2 years  - Safe sex - if you may be exposed to STDs, use a condom.  - Alcohol -  If you drink, do it moderately, less than 2 drinks per day.  - [place]. Choose someone to speak for you if you are not able.  - Depression is common in our stressful world.If you're feeling down or losing interest in things you normally enjoy, please come in for a visit.  - Violence - If anyone is threatening or hurting you, please call immediately.  Declines foot exam today as previously completed by endo per pt report       Return in about 6  months (around 02/13/2023) for chonic disease management.    Vonna Kotyk, FNP, have reviewed all documentation for this visit. The documentation on 08/14/22 for the exam, diagnosis, procedures, and orders are all accurate and complete.  Gwyneth Sprout, [place] [phone] (phone) [phone] (fax)  [place]

## 2022-08-13 ENCOUNTER — Encounter: Payer: Medicare PPO | Admitting: Family Medicine

## 2022-08-14 ENCOUNTER — Encounter: Payer: Self-pay | Admitting: Family Medicine

## 2022-08-14 ENCOUNTER — Ambulatory Visit (INDEPENDENT_AMBULATORY_CARE_PROVIDER_SITE_OTHER): Payer: Medicare PPO | Admitting: Family Medicine

## 2022-08-14 VITALS — BP 127/77 | HR 106 | Resp 16 | Ht 65.0 in | Wt 192.0 lb

## 2022-08-14 DIAGNOSIS — D692 Other nonthrombocytopenic purpura: Secondary | ICD-10-CM | POA: Insufficient documentation

## 2022-08-14 DIAGNOSIS — Z125 Encounter for screening for malignant neoplasm of prostate: Secondary | ICD-10-CM | POA: Diagnosis not present

## 2022-08-14 DIAGNOSIS — I10 Essential (primary) hypertension: Secondary | ICD-10-CM | POA: Insufficient documentation

## 2022-08-14 DIAGNOSIS — R809 Proteinuria, unspecified: Secondary | ICD-10-CM | POA: Diagnosis not present

## 2022-08-14 DIAGNOSIS — I152 Hypertension secondary to endocrine disorders: Secondary | ICD-10-CM

## 2022-08-14 DIAGNOSIS — Z0000 Encounter for general adult medical examination without abnormal findings: Secondary | ICD-10-CM | POA: Insufficient documentation

## 2022-08-14 DIAGNOSIS — N138 Other obstructive and reflux uropathy: Secondary | ICD-10-CM | POA: Diagnosis not present

## 2022-08-14 DIAGNOSIS — E1129 Type 2 diabetes mellitus with other diabetic kidney complication: Secondary | ICD-10-CM | POA: Diagnosis not present

## 2022-08-14 DIAGNOSIS — N401 Enlarged prostate with lower urinary tract symptoms: Secondary | ICD-10-CM

## 2022-08-14 DIAGNOSIS — E1159 Type 2 diabetes mellitus with other circulatory complications: Secondary | ICD-10-CM | POA: Diagnosis not present

## 2022-08-14 DIAGNOSIS — J418 Mixed simple and mucopurulent chronic bronchitis: Secondary | ICD-10-CM | POA: Diagnosis not present

## 2022-08-14 MED ORDER — AMLODIPINE BESYLATE 5 MG PO TABS: 5.0000 mg | ORAL_TABLET | Freq: Every day | ORAL | 3 refills | Status: DC

## 2022-08-14 MED ORDER — LANTUS SOLOSTAR 100 UNIT/ML ~~LOC~~ SOPN: 53.0000 [IU] | PEN_INJECTOR | Freq: Every day | SUBCUTANEOUS | 0 refills | Status: DC

## 2022-08-14 NOTE — Assessment & Plan Note (Signed)
Acute on chronic, has stopped ASA use

## 2022-08-14 NOTE — Assessment & Plan Note (Signed)
Chronic, stable Reports of DOE w/o O2 On 2LPM; however, not using today in office

## 2022-08-14 NOTE — Assessment & Plan Note (Signed)
UTD on vision and dental Continues to work Monday-Thursday 9a-3p

## 2022-08-14 NOTE — Assessment & Plan Note (Signed)
Chronic, stable Continue Norvasc 5 Continue Ramipril 10 mg

## 2022-08-14 NOTE — Assessment & Plan Note (Signed)
Repeat PSA; reports poor sleeping patterns related to nocturia

## 2022-08-14 NOTE — Assessment & Plan Note (Signed)
Chronic, repeat A1c Currently using 22/12 lispro and 53 lantus  Followed by endo

## 2022-08-14 NOTE — Assessment & Plan Note (Addendum)
Things to do to keep yourself healthy  - Exercise at least 30-45 minutes a day, 3-4 days a week.  - Eat a low-fat diet with lots of fruits and vegetables, up to 7-9 servings per day.  - Seatbelts can save your life. Wear them always.  - Smoke detectors on every level of your home, check batteries every year.  - Eye Doctor - have an eye exam every 1-2 years  - Safe sex - if you may be exposed to STDs, use a condom.  - Alcohol -  If you drink, do it moderately, less than 2 drinks per day.  - [place]. Choose someone to speak for you if you are not able.  - Depression is common in our stressful world.If you're feeling down or losing interest in things you normally enjoy, please come in for a visit.  - Violence - If anyone is threatening or hurting you, please call immediately.  Declines foot exam today as previously completed by endo per pt report

## 2022-08-16 LAB — COMPREHENSIVE METABOLIC PANEL
ALT: 30 IU/L (ref 0–44)
AST: 39 IU/L (ref 0–40)
Albumin/Globulin Ratio: 1.2 (ref 1.2–2.2)
Albumin: 4 g/dL (ref 3.8–4.8)
Alkaline Phosphatase: 79 IU/L (ref 44–121)
BUN/Creatinine Ratio: 16 (ref 10–24)
BUN: 15 mg/dL (ref 8–27)
Bilirubin Total: 0.2 mg/dL (ref 0.0–1.2)
CO2: 22 mmol/L (ref 20–29)
Calcium: 9.3 mg/dL (ref 8.6–10.2)
Chloride: 102 mmol/L (ref 96–106)
Creatinine, Ser: 0.91 mg/dL (ref 0.76–1.27)
Globulin, Total: 3.3 g/dL (ref 1.5–4.5)
Glucose: 120 mg/dL — ABNORMAL HIGH (ref 70–99)
Potassium: 4.4 mmol/L (ref 3.5–5.2)
Sodium: 143 mmol/L (ref 134–144)
Total Protein: 7.3 g/dL (ref 6.0–8.5)
eGFR: 89 mL/min/{1.73_m2} (ref >59)

## 2022-08-16 LAB — MICROALBUMIN / CREATININE URINE RATIO
Creatinine, Urine: 84.6 mg/dL
Microalb/Creat Ratio: 927 mg/g creat — ABNORMAL HIGH (ref 0–29)
Microalbumin, Urine: 784.1 ug/mL

## 2022-08-16 LAB — PSA: Prostate Specific Ag, Serum: 0.3 ng/mL (ref 0.0–4.0)

## 2022-08-16 LAB — CBC WITH DIFFERENTIAL/PLATELET
Basophils Absolute: 0 10*3/uL (ref 0.0–0.2)
Basos: 1 %
EOS (ABSOLUTE): 0.3 10*3/uL (ref 0.0–0.4)
Eos: 5 %
Hematocrit: 43.2 % (ref 37.5–51.0)
Hemoglobin: 14.3 g/dL (ref 13.0–17.7)
Immature Grans (Abs): 0 10*3/uL (ref 0.0–0.1)
Immature Granulocytes: 0 %
Lymphocytes Absolute: 1.3 10*3/uL (ref 0.7–3.1)
Lymphs: 19 %
MCH: 27.2 pg (ref 26.6–33.0)
MCHC: 33.1 g/dL (ref 31.5–35.7)
MCV: 82 fL (ref 79–97)
Monocytes Absolute: 0.5 10*3/uL (ref 0.1–0.9)
Monocytes: 8 %
Neutrophils Absolute: 4.6 10*3/uL (ref 1.4–7.0)
Neutrophils: 67 %
Platelets: 195 10*3/uL (ref 150–450)
RBC: 5.26 x10E6/uL (ref 4.14–5.80)
RDW: 14.3 % (ref 11.6–15.4)
WBC: 6.8 10*3/uL (ref 3.4–10.8)

## 2022-08-16 NOTE — Progress Notes (Signed)
Urine micro albumin is elevated. Would recommend farxiga or similar to assist with kidney health. You can also choose to address with your endocrinologist.  All other labs are normal and stable.

## 2022-08-25 ENCOUNTER — Other Ambulatory Visit: Payer: Self-pay | Admitting: Family Medicine

## 2022-08-26 ENCOUNTER — Other Ambulatory Visit: Payer: Self-pay | Admitting: Physician Assistant

## 2022-08-26 DIAGNOSIS — I152 Hypertension secondary to endocrine disorders: Secondary | ICD-10-CM

## 2022-08-26 DIAGNOSIS — E1159 Type 2 diabetes mellitus with other circulatory complications: Secondary | ICD-10-CM

## 2022-08-26 NOTE — Telephone Encounter (Signed)
Requested Prescriptions  Pending Prescriptions Disp Refills   ramipril (ALTACE) 10 MG capsule [Pharmacy Med Name: RAMIPRIL 10MG  CAPSULES] 90 capsule 3    Sig: TAKE 1 CAPSULE(10 MG) BY MOUTH DAILY     Cardiovascular:  ACE Inhibitors Passed - 08/26/2022  9:26 AM      Passed - Cr in normal range and within 180 days    Creatinine  Date Value Ref Range Status  03/29/2014 2.20 (H) 0.60 - 1.30 mg/dL Final   Creatinine, Ser  Date Value Ref Range Status  08/14/2022 0.91 0.76 - 1.27 mg/dL Final         Passed - K in normal range and within 180 days    Potassium  Date Value Ref Range Status  08/14/2022 4.4 3.5 - 5.2 mmol/L Final  03/29/2014 3.5 3.5 - 5.1 mmol/L Final         Passed - Patient is not pregnant      Passed - Last BP in normal range    BP Readings from Last 1 Encounters:  08/14/22 127/77         Passed - Valid encounter within last 6 months    Recent Outpatient Visits           1 week ago Annual physical exam   [facility], FNP   1 year ago Encounter for annual wellness visit (AWV) in Medicare patient   [facility] [place], [place], MD   1 year ago Orthostatic hypotension   [facility] [place], [place], MD   2 years ago Annual physical exam   [facility] M, M   2 years ago COPD exacerbation [facility])   [facility], Trey Sailors       Future Appointments             In 5 months Bacigalupo, [place], MD [facility], [facility]

## 2022-08-27 DIAGNOSIS — E1129 Type 2 diabetes mellitus with other diabetic kidney complication: Secondary | ICD-10-CM | POA: Diagnosis not present

## 2022-08-27 DIAGNOSIS — Z794 Long term (current) use of insulin: Secondary | ICD-10-CM | POA: Diagnosis not present

## 2022-08-27 DIAGNOSIS — R809 Proteinuria, unspecified: Secondary | ICD-10-CM | POA: Diagnosis not present

## 2022-08-27 DIAGNOSIS — I10 Essential (primary) hypertension: Secondary | ICD-10-CM | POA: Diagnosis not present

## 2022-09-14 DIAGNOSIS — I10 Essential (primary) hypertension: Secondary | ICD-10-CM | POA: Diagnosis not present

## 2022-09-14 DIAGNOSIS — R809 Proteinuria, unspecified: Secondary | ICD-10-CM | POA: Diagnosis not present

## 2022-10-06 ENCOUNTER — Other Ambulatory Visit: Payer: Self-pay

## 2022-10-06 DIAGNOSIS — Z87891 Personal history of nicotine dependence: Secondary | ICD-10-CM

## 2022-10-06 DIAGNOSIS — Z122 Encounter for screening for malignant neoplasm of respiratory organs: Secondary | ICD-10-CM

## 2022-10-07 ENCOUNTER — Ambulatory Visit: Payer: Medicare PPO

## 2022-10-14 ENCOUNTER — Other Ambulatory Visit: Payer: Self-pay | Admitting: Family Medicine

## 2022-10-20 ENCOUNTER — Other Ambulatory Visit: Payer: Self-pay | Admitting: Family Medicine

## 2022-10-21 ENCOUNTER — Ambulatory Visit
Admission: RE | Admit: 2022-10-21 | Discharge: 2022-10-21 | Disposition: A | Discharge status: Home / Self Care | Payer: Medicare PPO | Source: Ambulatory Visit | Attending: Acute Care | Admitting: Acute Care

## 2022-10-21 DIAGNOSIS — Z122 Encounter for screening for malignant neoplasm of respiratory organs: Secondary | ICD-10-CM | POA: Insufficient documentation

## 2022-10-21 DIAGNOSIS — F17210 Nicotine dependence, cigarettes, uncomplicated: Secondary | ICD-10-CM | POA: Diagnosis not present

## 2022-10-21 DIAGNOSIS — Z87891 Personal history of nicotine dependence: Secondary | ICD-10-CM | POA: Diagnosis not present

## 2022-10-22 DIAGNOSIS — I10 Essential (primary) hypertension: Secondary | ICD-10-CM | POA: Diagnosis not present

## 2022-10-22 DIAGNOSIS — E65 Localized adiposity: Secondary | ICD-10-CM | POA: Diagnosis not present

## 2022-10-22 DIAGNOSIS — E785 Hyperlipidemia, unspecified: Secondary | ICD-10-CM | POA: Diagnosis not present

## 2022-10-22 DIAGNOSIS — E1121 Type 2 diabetes mellitus with diabetic nephropathy: Secondary | ICD-10-CM | POA: Diagnosis not present

## 2022-10-22 DIAGNOSIS — J449 Chronic obstructive pulmonary disease, unspecified: Secondary | ICD-10-CM | POA: Diagnosis not present

## 2022-10-22 DIAGNOSIS — Z8249 Family history of ischemic heart disease and other diseases of the circulatory system: Secondary | ICD-10-CM | POA: Diagnosis not present

## 2022-10-22 DIAGNOSIS — Z794 Long term (current) use of insulin: Secondary | ICD-10-CM | POA: Diagnosis not present

## 2022-10-22 LAB — BASIC METABOLIC PANEL
BUN: 14 (ref 4–21)
CO2: 22 (ref 13–22)
Chloride: 100 (ref 99–108)
Creatinine: 1.2 (ref 0.6–1.3)
Glucose: 117
Potassium: 4.7 mEq/L (ref 3.5–5.1)
Sodium: 137 (ref 137–147)

## 2022-10-22 LAB — HM DIABETES FOOT EXAM: HM Diabetic Foot Exam: NORMAL

## 2022-10-22 LAB — HEPATIC FUNCTION PANEL
ALT: 16 U/L (ref 10–40)
AST: 20 (ref 14–40)
Alkaline Phosphatase: 77 (ref 25–125)
Bilirubin, Total: 0.3

## 2022-10-22 LAB — PROTEIN / CREATININE RATIO, URINE
Albumin, U: 217.1
Creatinine, Urine: 88.4

## 2022-10-22 LAB — COMPREHENSIVE METABOLIC PANEL
Albumin: 4.1 (ref 3.5–5.0)
Calcium: 8.4 — AB (ref 8.7–10.7)
eGFR: 63

## 2022-10-22 LAB — MICROALBUMIN / CREATININE URINE RATIO: Microalb Creat Ratio: 246

## 2022-10-22 LAB — HEMOGLOBIN A1C: Hemoglobin A1C: 6.3

## 2022-10-28 ENCOUNTER — Telehealth: Payer: Self-pay | Admitting: Acute Care

## 2022-10-28 ENCOUNTER — Other Ambulatory Visit: Payer: Self-pay

## 2022-10-28 DIAGNOSIS — Z87891 Personal history of nicotine dependence: Secondary | ICD-10-CM

## 2022-10-28 DIAGNOSIS — R911 Solitary pulmonary nodule: Secondary | ICD-10-CM

## 2022-10-28 NOTE — Telephone Encounter (Signed)
I have called the patient with the results of his low-dose screening CT.  His scan was read as a lung RADS 3, probably benign. I explained that there was notation of a New solid pulmonary nodule of the left upper lobe measuring 5.9 mm in mean diameter on image 121. Additional tiny new solid pulmonary nodule of the left upper lobe measuring 2.7 mm on image 189. Other previously seen bilateral pulmonary nodules are stable. I explained the plan will be for 58-month follow-up as we would prefer not to wait a full year to reevaluate these nodules.  He is in agreement with this plan. There was also notation of severe three-vessel coronary artery disease and left main disease on the scan.  This is a nongated exam therefore degree and severity cannot be determined however I have discussed this with the patient and I have encouraged him to discuss it with his primary care doctor. Patient has been followed by cardiology in the past, last echo was 2021, and in 2020 patient was noted to have 3% vessel occlusion.  He is on statin medication however I have encouraged him to talk with his primary care doctor regarding rereferral to cardiology and evaluation of CT imaging finding. Denise please fax results to PCP, let her know plan is for a 36-month follow-up low-dose CT to reevaluate the left upper lobe pulmonary nodule, and have her review the coronary artery disease findings. Thank you so much

## 2022-10-28 NOTE — Telephone Encounter (Signed)
Results/plan faxed to PCP. Order placed for 6 months follow up LCS LDCT.

## 2022-11-04 NOTE — Progress Notes (Signed)
I,Sulibeya S Dimas,acting as a scribe for Lavon Paganini, MD.,have documented all relevant documentation on the behalf of Lavon Paganini, MD,as directed by  Lavon Paganini, MD while in the presence of Lavon Paganini, MD.     Established patient visit   Patient: Billy Wise   DOB: 09/04/1946   77 y.o. Male  MRN: [id number] Visit Date: 11/05/2022  Today's healthcare provider: Lavon Paganini, MD   Chief Complaint  Patient presents with   Follow-up   Subjective    HPI  Patient here to go over CT scan done on 10/21/22.    Lung Cancer screening from 10/21/22 shows: IMPRESSION: 1. Lung-RADS 3, probably benign findings. Short-term follow-up in 6 months is recommended with repeat low-dose chest CT without contrast (please use the following order, CT CHEST LCS NODULE FOLLOW-UP W/O CM). 2. Severe three-vessel and left main coronary artery calcifications. 3. Aortic Atherosclerosis (ICD10-I70.0) and Emphysema (ICD10-J43.9).  Does admit to exertional chest pain (new problem) and DOE.  Has previously blamed this on his lung disease, but now he is willing to consider this may be heart related. Not exerting himself much at all. -----------------------------------------------------------------------------------------   Medications: Outpatient Medications Prior to Visit  Medication Sig   Accu-Chek Softclix Lancets lancets Use to check blood sugar twice a day. DX E11.9   acidophilus (RISAQUAD) CAPS capsule Take 1 capsule by mouth daily.   amLODipine (NORVASC) 5 MG tablet Take 1 tablet (5 mg total) by mouth daily.   ascorbic acid (VITAMIN C) 1000 MG tablet Take 1 tablet by mouth daily.   Blood Glucose Monitoring Suppl (ACCU-CHEK AVIVA PLUS) w/Device KIT Use to check blood sugar twice a day.  DX E11.9   BREZTRI AEROSPHERE 160-9-4.8 MCG/ACT AERO INHALE 2 PUFFS INTO THE LUNGS IN THE MORNING AND AT BEDTIME   dapagliflozin propanediol (FARXIGA) 10 MG TABS tablet Take 10 mg by  mouth daily.   Dulaglutide (TRULICITY) 4.5 NW/2.9FA SOPN Inject into the skin.   finasteride (PROSCAR) 5 MG tablet Take 1 tablet (5 mg total) by mouth daily.   glucose blood (ACCU-CHEK GUIDE) test strip Use as instructed   insulin glargine (LANTUS SOLOSTAR) 100 UNIT/ML Solostar Pen Inject 53 Units into the skin at bedtime.   insulin lispro (HUMALOG) 100 UNIT/ML KwikPen Junior breakfast 22 units and supper 12 units directly with food   Insulin Pen Needle (PEN NEEDLES) 31G X 5 MM MISC 1 each by Does not apply route daily as needed.   ipratropium-albuterol (DUONEB) 0.5-2.5 (3) MG/3ML SOLN USE 3 ML VIA NEBULIZER EVERY 6 HOURS   Lactobacillus (PROBIOTIC ACIDOPHILUS PO) Take 1 capsule by mouth daily.    metFORMIN (GLUCOPHAGE) 1000 MG tablet TAKE 1 TABLET(1000 MG) BY MOUTH TWICE DAILY   omeprazole (PRILOSEC) 20 MG capsule TAKE 1 CAPSULE(20 MG) BY MOUTH DAILY   oxybutynin (DITROPAN-XL) 10 MG 24 hr tablet TAKE 1 TABLET(10 MG) BY MOUTH DAILY   PROAIR HFA 108 (90 Base) MCG/ACT inhaler Inhale 2 puffs into the lungs every 4 (four) hours as needed for wheezing.   tamsulosin (FLOMAX) 0.4 MG CAPS capsule TAKE 2 CAPSULES(0.8 MG) BY MOUTH DAILY   zolpidem (AMBIEN) 10 MG tablet TAKE 1 TABLET(10 MG) BY MOUTH AT BEDTIME   [DISCONTINUED] ramipril (ALTACE) 10 MG capsule TAKE 1 CAPSULE(10 MG) BY MOUTH DAILY   [DISCONTINUED] simvastatin (ZOCOR) 20 MG tablet TAKE 1 TABLET(20 MG) BY MOUTH EVERY EVENING   No facility-administered medications prior to visit.    Review of Systems  Constitutional:  Positive for fatigue. Negative  for appetite change.  Respiratory:  Positive for shortness of breath.   Cardiovascular:  Negative for chest pain and leg swelling.  Gastrointestinal:  Negative for abdominal pain, nausea and vomiting.       Objective    BP 130/80 (BP Location: Left Arm, Patient Position: Sitting, Cuff Size: Large)   Pulse (!) 102   Temp 97.7 F (36.5 C) (Temporal)   Resp 16   Wt 187 lb 1.6 oz (84.9 kg)    SpO2 (!) 87%   BMI 31.14 kg/m    Physical Exam Vitals reviewed.  Constitutional:      General: He is not in acute distress.    Appearance: Normal appearance. He is not diaphoretic.  HENT:     Head: Normocephalic and atraumatic.  Eyes:     General: No scleral icterus.    Conjunctiva/sclera: Conjunctivae normal.  Cardiovascular:     Rate and Rhythm: Normal rate and regular rhythm.     Heart sounds: Normal heart sounds. No murmur heard. Pulmonary:     Effort: Pulmonary effort is normal. No respiratory distress.     Breath sounds: Normal breath sounds. No wheezing or rhonchi.  Musculoskeletal:     Cervical back: Neck supple.     Right lower leg: No edema.     Left lower leg: No edema.  Lymphadenopathy:     Cervical: No cervical adenopathy.  Skin:    General: Skin is warm and dry.     Findings: No rash.  Neurological:     Mental Status: He is alert and oriented to person, place, and time. Mental status is at baseline.  Psychiatric:        Mood and Affect: Mood normal.        Behavior: Behavior normal.       Results for orders placed or performed in visit on 11/05/22  Microalbumin / creatinine urine ratio  Result Value Ref Range   Microalb Creat Ratio 246   Protein / creatinine ratio, urine  Result Value Ref Range   Creatinine, Urine 88.4    Albumin, U 889.1   Basic metabolic panel  Result Value Ref Range   Glucose 117    BUN 14 4 - 21   CO2 22 13 - 22   Creatinine 1.2 0.6 - 1.3   Potassium 4.7 3.5 - 5.1 mEq/L   Sodium 137 137 - 147   Chloride 100 99 - 108  Comprehensive metabolic panel  Result Value Ref Range   eGFR 63    Calcium 8.4 (A) 8.7 - 10.7   Albumin 4.1 3.5 - 5.0  Hepatic function panel  Result Value Ref Range   Alkaline Phosphatase 77 25 - 125   ALT 16 10 - 40 U/L   AST 20 14 - 40   Bilirubin, Total 0.3   Hemoglobin A1c  Result Value Ref Range   Hemoglobin A1C 6.3   HM DIABETES FOOT EXAM  Result Value Ref Range   HM Diabetic Foot Exam  NORMAL     Assessment & Plan     Problem List Items Addressed This Visit       Cardiovascular and Mediastinum   Hypertension associated with diabetes ([place])    Well controlled Continue current medications Reviewed metabolic panel      Relevant Medications   rosuvastatin (CRESTOR) 5 MG tablet   dapagliflozin propanediol (FARXIGA) 10 MG TABS tablet   ramipril (ALTACE) 5 MG capsule   Coronary artery disease of native artery of native  heart with stable angina pectoris (HCC) - Primary    Noted to have severe 3 vessel and L main CAD on Chest CT He does admit to being symptomatic Referral to Cardiology for further eval/management - pt likely needs Cath In the interim discussed signs of unstable angina/MI and when to go to ED Start ASA 81 mg and increase statin to high intensity (from simvastatin to crestor 5mg  daily)      Relevant Medications   rosuvastatin (CRESTOR) 5 MG tablet   ramipril (ALTACE) 5 MG capsule   Other Relevant Orders   Ambulatory referral to Cardiology   Aortic atherosclerosis (HCC)    As above - Start ASA 81 mg and increase statin to high intensity (from simvastatin to crestor 5mg  daily)      Relevant Medications   rosuvastatin (CRESTOR) 5 MG tablet   ramipril (ALTACE) 5 MG capsule     Endocrine   Hyperlipidemia associated with type 2 diabetes mellitus (HCC)    As above - Start ASA 81 mg and increase statin to high intensity (from simvastatin to crestor 5mg  daily) Reviewed last lipid panel in [facility]      Relevant Medications   rosuvastatin (CRESTOR) 5 MG tablet   dapagliflozin propanediol (FARXIGA) 10 MG TABS tablet   ramipril (ALTACE) 5 MG capsule     Other   Chest pain    As above, concern this is angina related to CAD Start ASA 81 mg and increase statin to high intensity (from simvastatin to crestor 5mg  daily)      Relevant Orders   Ambulatory referral to Cardiology     Return for as scheduled.      I, , MD, have reviewed all  documentation for this visit. The documentation on 11/05/22 for the exam, diagnosis, procedures, and orders are all accurate and complete.   Kirrah Mustin, , MD, MPH [facility]

## 2022-11-05 ENCOUNTER — Encounter: Payer: Self-pay | Admitting: Family Medicine

## 2022-11-05 ENCOUNTER — Ambulatory Visit: Payer: Self-pay | Admitting: *Deleted

## 2022-11-05 ENCOUNTER — Ambulatory Visit: Payer: Medicare PPO | Admitting: Family Medicine

## 2022-11-05 VITALS — BP 130/80 | HR 102 | Temp 97.7°F | Resp 16 | Wt 187.1 lb

## 2022-11-05 DIAGNOSIS — E785 Hyperlipidemia, unspecified: Secondary | ICD-10-CM | POA: Diagnosis not present

## 2022-11-05 DIAGNOSIS — R079 Chest pain, unspecified: Secondary | ICD-10-CM

## 2022-11-05 DIAGNOSIS — E1159 Type 2 diabetes mellitus with other circulatory complications: Secondary | ICD-10-CM

## 2022-11-05 DIAGNOSIS — E1169 Type 2 diabetes mellitus with other specified complication: Secondary | ICD-10-CM | POA: Diagnosis not present

## 2022-11-05 DIAGNOSIS — I700 Atherosclerosis of aorta: Secondary | ICD-10-CM | POA: Insufficient documentation

## 2022-11-05 DIAGNOSIS — I152 Hypertension secondary to endocrine disorders: Secondary | ICD-10-CM

## 2022-11-05 DIAGNOSIS — I25118 Atherosclerotic heart disease of native coronary artery with other forms of angina pectoris: Secondary | ICD-10-CM | POA: Diagnosis not present

## 2022-11-05 MED ORDER — RAMIPRIL 5 MG PO CAPS: ORAL_CAPSULE | ORAL | 1 refills | Status: DC

## 2022-11-05 MED ORDER — ROSUVASTATIN CALCIUM 5 MG PO TABS: 5.0000 mg | ORAL_TABLET | Freq: Every day | ORAL | 3 refills | Status: DC

## 2022-11-05 NOTE — Telephone Encounter (Signed)
Pt questioning how to take Ramipril, read as ordered; " Take 2 capsules (10 mg total) by mouth daily AND 1 capsule (5 mg total) at bedtime. Dispense: 270 capsule, Refills: 1 ordered " from today. Pt states that is how he has been taking med "But the 5mg  at night wasn't on my med record."  Reviewed order, pt verbalizes understanding.  Advised to CB if any additional questions arise.  Reason for Disposition  Caller has medicine question only, adult not sick, AND triager answers question  Answer Assessment - Initial Assessment Questions 1. NAME of MEDICINE: "What medicine(s) are you calling about?"     Ramiprim 2. QUESTION: "What is your question?" (e.g., double dose of medicine, side effect)     HOw should I be taking it 3. PRESCRIBER: "Who prescribed the medicine?" Reason: if prescribed by specialist, call should be referred to that group.     PCP  Protocols used: Medication Question Call-A-AH

## 2022-11-05 NOTE — Assessment & Plan Note (Signed)
As above, concern this is angina related to CAD Start ASA 81 mg and increase statin to high intensity (from simvastatin to crestor 5mg  daily)

## 2022-11-05 NOTE — Assessment & Plan Note (Signed)
Noted to have severe 3 vessel and L main CAD on Chest CT He does admit to being symptomatic Referral to Cardiology for further eval/management - pt likely needs Cath In the interim discussed signs of unstable angina/MI and when to go to ED Start ASA 81 mg and increase statin to high intensity (from simvastatin to crestor 5mg  daily)

## 2022-11-05 NOTE — Assessment & Plan Note (Signed)
Well controlled Continue current medications Reviewed metabolic panel 

## 2022-11-05 NOTE — Assessment & Plan Note (Signed)
As above - Start ASA 81 mg and increase statin to high intensity (from simvastatin to crestor 5mg  daily)

## 2022-11-05 NOTE — Assessment & Plan Note (Signed)
As above - Start ASA 81 mg and increase statin to high intensity (from simvastatin to crestor 5mg  daily) Reviewed last lipid panel in [facility]

## 2022-11-06 NOTE — Telephone Encounter (Signed)
Noted  

## 2022-11-09 ENCOUNTER — Other Ambulatory Visit: Payer: Self-pay

## 2022-11-09 DIAGNOSIS — I152 Hypertension secondary to endocrine disorders: Secondary | ICD-10-CM

## 2022-11-09 DIAGNOSIS — E1169 Type 2 diabetes mellitus with other specified complication: Secondary | ICD-10-CM

## 2022-11-09 DIAGNOSIS — E1129 Type 2 diabetes mellitus with other diabetic kidney complication: Secondary | ICD-10-CM

## 2022-11-09 DIAGNOSIS — I25118 Atherosclerotic heart disease of native coronary artery with other forms of angina pectoris: Secondary | ICD-10-CM

## 2022-11-13 ENCOUNTER — Telehealth: Payer: Self-pay

## 2022-11-13 NOTE — Progress Notes (Signed)
Care Management & Coordination Services Pharmacy Team Pharmacy Assistant   Name: Billy Wise  MRN: [id number] DOB: June 22, 1946  Conditions to be addressed/monitored: CAD, HTN, HLD, COPD, DMII, GERD, and Other secondary pulmonary hypertension, Senile purpura, Aortic Atherosclerosis,   Chart review: Recent office visits:  11/05/2022 Lavon Paganini, MD (PCP Office Visit) for Follow-up- Started: Rosuvastatin 5 mg, Stopped: Simvastatin 20 mg, Lab orders placed, Referral to Cardiology placed, DM Foot Exam placed,   08/14/2022 Tally Joe, FNP (PCP Office Visit) for Annual Physical Exam- Changed: Insulin Glargine 49 units at bedtime to 53 units daily, Stopped: Aspirin 81 mg, Benzonatate 100 mg, Cyclobenzaprine 5 mg, Potassium,   06/26/2022 Fenton Malling, PA-C (PCP Video Visit) for COVID-19- Started: Benzonatate 100 mg 3 times daily prn, Molnupiravir 800 mg twice daily, No orders placed,   Recent consult visits:  10/22/2022 Leanor Kail. Rose, MD (Endocrinology) for Diabetes- No medication changes noted, No orders placed  09/14/2022 Laurena Slimmer, MD (Nephrology) for Proteinuria- No medication changes noted, Lab orders placed, patient to follow-up in 6 months  08/27/2022 Laurena Slimmer, MD (Nephrology) I am unable to view this note  06/11/2023 Benedetto Coons, PA-C (Dermatology) for 1 year follow-up- Stopped: Aspirin 81 mg due to therapy completed, No orders noted  06/01/2022 Greer Pickerel, DM (Endocrinology) No medication changes noted, No orders placed, patient to follow-up in 4 months  Hospital visits:  None in previous 6 months  Reason for Encounter: Appointment Reminder  Contacted patient to confirm telephone appointment with Junius Argyle, CPP, on 11/16/2022 at 1300. Spoke with patient on 11/13/2022   Do you have any problems getting your medications? No  What is your top health concern you would like to discuss at your upcoming visit? Per patient he does have a lot  of health issues. Patient stated that he recently had a test done on his lungs and he is waiting on the specialist appointments to see what he needs to do.    Patient reports that he takes his blood sugars 3 times daily and his average is around 130. Per patient he is not interested in the [place] or [place] as he has tried the [place] before and didn't like it. Per patient his skin is so thin that either it doesn't stay on and if it does when he is changing the sensors the tape tears his skin so he is okay with pricking his finger.   Patient  stated that he spends around 3300.00 yearly on his drugs and would love some assistance but feels his income is too high for assistance.   Patient reports that he does have side effects from his medications but he feels like that is apart of taking medications so none of the side effects are too concerning for him not to continue on with the medications that he has.   Have you seen any other providers since your last visit with PCP? No   Star Rating Drugs:  Farxiga 10 mg last filled on 10/16/2022 for a 30-Day supply with [facility] Trulicity 4.5 mg last filled on 10/23/2022 for a 90-Day supply with [facility] Rosuvastatin 5 mg last filled on 11/05/2022 for a 90-Day supply with [facility] Ramipril 5 mg last filled on 01/18/224 for a 90-Day supply with [facility] Metformin last filled on 10/14/2022 for a 90-Day supply with [facility]   Care Gaps: Annual wellness visit in last year? Yes  If Diabetic: Last eye exam / retinopathy screening: 03/14/2022 Last  diabetic foot exam: 10/22/2022  Lynann Bologna, CPA/CMA Clinical Pharmacist Assistant Phone: [phone]

## 2022-11-16 ENCOUNTER — Ambulatory Visit: Payer: Medicare PPO

## 2022-11-16 DIAGNOSIS — I152 Hypertension secondary to endocrine disorders: Secondary | ICD-10-CM

## 2022-11-16 DIAGNOSIS — I25118 Atherosclerotic heart disease of native coronary artery with other forms of angina pectoris: Secondary | ICD-10-CM

## 2022-11-16 DIAGNOSIS — J418 Mixed simple and mucopurulent chronic bronchitis: Secondary | ICD-10-CM

## 2022-11-16 DIAGNOSIS — E1129 Type 2 diabetes mellitus with other diabetic kidney complication: Secondary | ICD-10-CM

## 2022-11-16 MED ORDER — DAPAGLIFLOZIN PROPANEDIOL 10 MG PO TABS: 10.0000 mg | ORAL_TABLET | Freq: Every day | ORAL | 3 refills | Status: AC

## 2022-11-16 NOTE — Addendum Note (Signed)
Addended by: Daron Offer A on: 11/16/2022 01:49 PM   Modules accepted: Orders

## 2022-11-16 NOTE — Progress Notes (Signed)
Care Management & Coordination Services Pharmacy Note  11/16/2022 Name:  Billy Wise MRN:  [id number] DOB:  02-Oct-1946  Summary: Patient presents for initial consult.   -Patient reports worsening breathlessness over the past year, may be multifactorial. Patient has follow-up with Cardiology on 12/21/22 to evaluate CAD.   -Patient not interested in CGM due to sensitive skin. Previously had challenges with the sensor staying on or causing skin abrasions.   Recommendations/Changes made from today's visit: Continue current medications  Follow up plan: CPP follow-up 6 months    Subjective: Billy Wise is an 77 y.o. year old male who is a primary patient of Bacigalupo, Dionne Bucy, MD.  The care coordination team was consulted for assistance with disease management and care coordination needs.    Engaged with patient by telephone for initial visit.  Recent office visits: 11/05/2022 Lavon Paganini, MD (PCP Office Visit) for Follow-up- Started: Rosuvastatin 5 mg, Stopped: Simvastatin 20 mg, Lab orders placed, Referral to Cardiology placed, DM Foot Exam placed,    08/14/2022 Tally Joe, FNP (PCP Office Visit) for Annual Physical Exam- Changed: Insulin Glargine 49 units at bedtime to 53 units daily, Stopped: Aspirin 81 mg, Benzonatate 100 mg, Cyclobenzaprine 5 mg, Potassium,    06/26/2022 Fenton Malling, PA-C (PCP Video Visit) for COVID-19- Started: Benzonatate 100 mg 3 times daily prn, Molnupiravir 800 mg twice daily, No orders placed,  Recent consult visits: 10/22/2022 Leanor Kail. Kalman Shan, MD (Endocrinology) for Diabetes- No medication changes noted, No orders placed   09/14/2022 Laurena Slimmer, MD (Nephrology) for Proteinuria- No medication changes noted, Lab orders placed, patient to follow-up in 6 months   08/27/2022 Laurena Slimmer, MD (Nephrology) I am unable to view this note   06/11/2023 Benedetto Coons, PA-C (Dermatology) for 1 year follow-up- Stopped: Aspirin  81 mg due to therapy completed, No orders noted   06/01/2022 Greer Pickerel, DM (Endocrinology) No medication changes noted, No orders placed, patient to follow-up in 4 months  Hospital visits: None in previous 6 months   Objective:  Lab Results  Component Value Date   CREATININE 1.2 10/22/2022   BUN 14 10/22/2022   EGFR 63 10/22/2022   GFRNONAA 79 06/24/2021   GFRAA 93 08/05/2020   NA 137 10/22/2022   K 4.7 10/22/2022   CALCIUM 8.4 (A) 10/22/2022   CO2 22 10/22/2022   GLUCOSE 120 (H) 08/14/2022    Lab Results  Component Value Date/Time   HGBA1C 6.3 10/22/2022 12:00 AM   HGBA1C 7.3 05/26/2021 12:00 AM   MICROALBUR 491.5 05/26/2021 12:00 AM    Last diabetic Eye exam:  Lab Results  Component Value Date/Time   HMDIABEYEEXA Retinopathy (A) 01/12/2022 12:00 AM    Last diabetic Foot exam:  Lab Results  Component Value Date/Time   HMDIABFOOTEX NORMAL 10/22/2022 12:00 AM     Lab Results  Component Value Date   CHOL 116 05/26/2021   HDL 25 (A) 05/26/2021   LDLCALC 50 05/26/2021   TRIG 256 (A) 05/26/2021   CHOLHDL 5.0 08/05/2020       Latest Ref Rng & Units 10/22/2022   12:00 AM 08/14/2022    8:46 AM 08/05/2020    8:00 AM  Hepatic Function  Total Protein 6.0 - 8.5 g/dL  7.3  7.6   Albumin 3.5 - 5.0 4.1     4.0  4.2   AST 14 - 40 20     39  20   ALT 10 - 40 U/L 16     30  26   Alk Phosphatase 25 - 125 77     79  81   Total Bilirubin 0.0 - 1.2 mg/dL  0.2  0.2      This result is from an external source.    Lab Results  Component Value Date/Time   TSH 3.04 05/26/2021 12:00 AM   TSH 3.230 08/05/2020 08:00 AM   TSH 1.31 09/19/2018 12:00 AM   TSH 1.31 09/19/2018 12:00 AM       Latest Ref Rng & Units 08/14/2022    8:46 AM 08/05/2020    8:00 AM 04/19/2019   10:16 AM  CBC  WBC 3.4 - 10.8 x10E3/uL 6.8  7.2  6.5   Hemoglobin 13.0 - 17.7 g/dL 14.3  13.8  14.3   Hematocrit 37.5 - 51.0 % 43.2  42.2  41.5   Platelets 150 - 450 x10E3/uL 195  207  225     Lab  Results  Component Value Date/Time   VITAMINB12 1,933 09/19/2018 12:00 AM    Clinical ASCVD: No  The ASCVD Risk score (Arnett DK, et al., 2019) failed to calculate for the following reasons:   The valid total cholesterol range is 130 to 320 mg/dL       11/05/2022    9:30 AM 08/14/2022    8:18 AM 08/07/2021    9:13 AM  Depression screen PHQ 2/9  Decreased Interest 0 0 0  Down, Depressed, Hopeless 0 0 0  PHQ - 2 Score 0 0 0  Altered sleeping 0 0 3  Tired, decreased energy 3 0 0  Change in appetite 0 0 0  Feeling bad or failure about yourself  0 0 0  Trouble concentrating 0 0 0  Moving slowly or fidgety/restless 0 0 0  Suicidal thoughts 0 0 0  PHQ-9 Score 3 0 3  Difficult doing work/chores Not difficult at all Not difficult at all Not difficult at all     Social History   Tobacco Use  Smoking Status Former   Packs/day: 1.50   Years: 50.00   Total pack years: 75.00   Types: Cigarettes   Quit date: 09/29/2008   Years since quitting: 14.1  Smokeless Tobacco Never   BP Readings from Last 3 Encounters:  11/05/22 130/80  08/14/22 127/77  08/20/21 126/70   Pulse Readings from Last 3 Encounters:  11/05/22 (!) 102  08/14/22 (!) 106  08/20/21 70   Wt Readings from Last 3 Encounters:  11/05/22 187 lb 1.6 oz (84.9 kg)  10/21/22 180 lb (81.6 kg)  08/14/22 192 lb (87.1 kg)   BMI Readings from Last 3 Encounters:  11/05/22 31.14 kg/m  10/21/22 29.95 kg/m  08/14/22 31.95 kg/m    No Known Allergies  Medications Reviewed Today     Reviewed by Virginia Crews, MD (Physician) on 11/05/22 at [phone]  Med List Status: <None>   Medication Order Taking? Sig Documenting Provider Last Dose Status Informant  Accu-Chek Softclix Lancets lancets [id number] Yes Use to check blood sugar twice a day. DX E11.9 Virginia Crews, MD Taking Active   acidophilus [facility]) CAPS capsule [id number] Yes Take 1 capsule by mouth daily. [provider] Taking Active Other   amLODipine (NORVASC) 5 MG tablet [id number] Yes Take 1 tablet (5 mg total) by mouth daily. Gwyneth Sprout, FNP Taking Active   ascorbic acid (VITAMIN C) 1000 MG tablet [id number] Yes Take 1 tablet by mouth daily. [provider] Taking Active   Blood Glucose Monitoring Suppl (ACCU-CHEK AVIVA  PLUS) w/Device KIT [id number] Yes Use to check blood sugar twice a day.  DX E11.9 Erasmo Downer, MD Taking Active   BREZTRI AEROSPHERE 160-9-4.8 MCG/ACT AERO [id number] Yes INHALE 2 PUFFS INTO THE LUNGS IN THE MORNING AND AT BEDTIME Salena Saner, MD Taking Active   dapagliflozin propanediol (FARXIGA) 10 MG TABS tablet [id number] Yes Take 10 mg by mouth daily. [provider]  Active   Dulaglutide (TRULICITY) 4.5 MG/0.5ML SOPN [id number] Yes Inject into the skin. [provider] Taking Active   finasteride (PROSCAR) 5 MG tablet [id number] Yes Take 1 tablet (5 mg total) by mouth daily. Erasmo Downer, MD Taking Active   glucose blood (ACCU-CHEK GUIDE) test strip [id number] Yes Use as instructed Beryle Flock Marzella Schlein, MD Taking Active   insulin glargine (LANTUS SOLOSTAR) 100 UNIT/ML Solostar Pen [id number] Yes Inject 53 Units into the skin at bedtime. Jacky Kindle, FNP Taking Active   insulin lispro (HUMALOG) 100 UNIT/ML KwikPen Junior [id number] Yes breakfast 22 units and supper 12 units directly with food [provider] Taking Active   Insulin Pen Needle (PEN NEEDLES) 31G X 5 MM MISC [id number] Yes 1 each by Does not apply route daily as needed. Erasmo Downer, MD Taking Active   ipratropium-albuterol (DUONEB) 0.5-2.5 (3) MG/3ML Criss Rosales [id number] Yes USE 3 ML VIA NEBULIZER EVERY 6 HOURS Bacigalupo, Marzella Schlein, MD Taking Active   Lactobacillus (PROBIOTIC ACIDOPHILUS PO) [id number] Yes Take 1 capsule by mouth daily.  [provider] Taking Active   metFORMIN (GLUCOPHAGE) 1000 MG tablet [id number] Yes TAKE 1 TABLET(1000 MG) BY MOUTH TWICE DAILY Jacky Kindle, FNP  Taking Active   omeprazole (PRILOSEC) 20 MG capsule [id number] Yes TAKE 1 CAPSULE(20 MG) BY MOUTH DAILY Simmons-Robinson, Makiera, MD Taking Active   oxybutynin (DITROPAN-XL) 10 MG 24 hr tablet [id number] Yes TAKE 1 TABLET(10 MG) BY MOUTH DAILY Simmons-Robinson, Makiera, MD Taking Active   PROAIR HFA 108 (90 Base) MCG/ACT inhaler [id number] Yes Inhale 2 puffs into the lungs every 4 (four) hours as needed for wheezing. Erasmo Downer, MD Taking Active   ramipril (ALTACE) 5 MG capsule [id number]  Take 2 capsules (10 mg total) by mouth daily AND 1 capsule (5 mg total) at bedtime. Erasmo Downer, MD  Active   rosuvastatin (CRESTOR) 5 MG tablet [id number] Yes Take 1 tablet (5 mg total) by mouth daily. Erasmo Downer, MD  Active   tamsulosin [facility]) 0.4 MG CAPS capsule [id number] Yes TAKE 2 CAPSULES(0.8 MG) BY MOUTH DAILY Bacigalupo, Marzella Schlein, MD Taking Active   zolpidem (AMBIEN) 10 MG tablet [id number] Yes TAKE 1 TABLET(10 MG) BY MOUTH AT BEDTIME Bacigalupo, Marzella Schlein, MD Taking Active             SDOH:  (Social Determinants of Health) assessments and interventions performed: Yes SDOH Interventions    Flowsheet Row Office Visit from 11/05/2022 in [facility] Office Visit from 08/07/2021 in [facility] Clinical Support from 05/06/2020 in [facility] Pulmonary Rehab from 11/30/2017 in [facility] Cardiac [facility] Pulmonary [facility]  SDOH Interventions      Food Insecurity Interventions -- -- Intervention Not Indicated --  Housing Interventions -- -- Intervention Not Indicated --  Transportation Interventions -- -- Intervention Not Indicated --  Depression Interventions/Treatment  PHQ2-9 Score <4 Follow-up Not Indicated PHQ2-9 Score <4 Follow-up Not Indicated -- Counseling  Financial Strain Interventions -- -- Intervention Not Indicated --  Physical Activity Interventions -- -- Intervention Not  Indicated --  Stress  Interventions -- -- Intervention Not Indicated --  Social Connections Interventions -- -- Intervention Not Indicated --       Medication Assistance: None required.  Patient affirms current coverage meets needs.  Medication Access: Within the past 30 days, how often has patient missed a dose of medication? None Is a pillbox or other method used to improve adherence? Yes  Factors that may affect medication adherence? no barriers identified Are meds synced by current pharmacy? No  Are meds delivered by current pharmacy? No  Does patient experience delays in picking up medications due to transportation concerns? No   [facility] Services Reviewed: Is patient disadvantaged to use [facility]?: No  Current Rx insurance plan: [employer] Name and location of Current pharmacy:  [id number] - [place], [place] - [address] AT [facility] OF [address] [facility] [address] & [address] [place] [address] Phone: [phone] Fax: [phone]  [facility]) - [place], [place] - [address] [facility] [address] [place] [address] [place] [address] Phone: [phone] Fax: [phone]  [facility] services reviewed with patient today?: Yes  Patient requests to transfer care to [facility]?: No  Reason patient declined to change pharmacies: Loyalty to other pharmacy/Patient preference  Compliance/Adherence/Medication fill history: Care Gaps: Shingrix  Covid  Star-Rating Drugs: Ramipril 5 mg: Last filled 11/05/22 for 90-DS Rosuvastatin 5 mg: Last filled 11/05/22 for 90-DS    Assessment/Plan  Hypertension (BP goal <130/80) -Controlled -Current treatment: Amlodipine 5 mg daily  Ramipril 10 mg AM, 5 mg PM daily  -Medications previously tried: NA  -Denies hypotensive/hypertensive symptoms -Recommended to continue current medication  Hyperlipidemia: (LDL goal < 55) -Controlled -history of CAD  -Current  treatment: Rosuvastatin 5 mg daily  -Medications previously tried: NA  -Recommended to continue current medication  Diabetes (A1c goal <7%) -Controlled -microalbuminuria.  -Current medications: Farxiga 10 mg daily  Metformin 1000 mg twice daily  Trulicity 4.5 mg weekly  Lantus 53 units daily (0.62 u/kg) Humalog 22/12 units -Medications previously tried: NA  -Patient not interested in CGM due to sensitive skin. Previously had challenges with the sensor staying on or causing skin abrasions.  -Current home glucose readings: monitors 3x daily.  -Denies hypoglycemic/hyperglycemic symptoms -Reports signifcant diarrhea and vomitting in December, one instance in January.  -Recommended to continue current medication  COPD (Goal: control symptoms and prevent exacerbations) -Controlled Quit smoking in 2012 after smoking 1.5 ppd for 50 years -O2 dependent, mainly uses at night.  -Patient reports worsening breathlessness over the past year, may be multifactorial. Patient has follow-up with Cardiology on 12/21/22 to evaluate CAD.  -Current treatment  Breztri 2 puffs twice daily  DuoNeb ProAir HFA 2 puffs every 4 hours as needed  -Medications previously tried: NA  -Gold Grade: Gold 2 (FEV1 50-79%) -Current COPD Classification:  B (high sx, 0-1 moderate exacerbations, no hospitalizations) -MMRC/CAT score: 3 -Pulmonary function testing:  FEV1 57% predicted (2021)  -Exacerbations requiring treatment in last 6 months: None -Patient reports consistent use of maintenance inhaler -Frequency of rescue inhaler use: Utilizes nebulizer at least once daily, rarely uses albuterol.  -Recommended to continue current medication  Angelena Sole, PharmD, P[place]y [place], CPP  Clinical Pharmacist Practitioner  [facility] [phone]

## 2022-12-15 ENCOUNTER — Ambulatory Visit: Payer: Medicare PPO | Admitting: Pulmonary Disease

## 2022-12-15 ENCOUNTER — Other Ambulatory Visit
Admission: RE | Admit: 2022-12-15 | Discharge: 2022-12-15 | Disposition: A | Discharge status: Home / Self Care | Payer: Medicare PPO | Attending: Pulmonary Disease | Admitting: Pulmonary Disease

## 2022-12-15 ENCOUNTER — Encounter: Payer: Self-pay | Admitting: Pulmonary Disease

## 2022-12-15 VITALS — BP 110/60 | HR 78 | Temp 97.8°F | Ht 65.0 in | Wt 184.6 lb

## 2022-12-15 DIAGNOSIS — J449 Chronic obstructive pulmonary disease, unspecified: Secondary | ICD-10-CM

## 2022-12-15 DIAGNOSIS — I2584 Coronary atherosclerosis due to calcified coronary lesion: Secondary | ICD-10-CM | POA: Diagnosis not present

## 2022-12-15 DIAGNOSIS — J9611 Chronic respiratory failure with hypoxia: Secondary | ICD-10-CM | POA: Diagnosis not present

## 2022-12-15 DIAGNOSIS — I2510 Atherosclerotic heart disease of native coronary artery without angina pectoris: Secondary | ICD-10-CM

## 2022-12-15 DIAGNOSIS — Z9981 Dependence on supplemental oxygen: Secondary | ICD-10-CM | POA: Diagnosis not present

## 2022-12-15 DIAGNOSIS — Z7951 Long term (current) use of inhaled steroids: Secondary | ICD-10-CM | POA: Insufficient documentation

## 2022-12-15 DIAGNOSIS — R911 Solitary pulmonary nodule: Secondary | ICD-10-CM | POA: Diagnosis not present

## 2022-12-15 NOTE — Patient Instructions (Signed)
We are going to get some new baseline breathing test.  I have ordered a blood test that will tell me more about your emphysema.  You are already scheduled for your follow-up chest CT.  We will see you in follow-up in 3 months time call sooner should any new problems arise.

## 2022-12-15 NOTE — Progress Notes (Signed)
Subjective:    Patient ID: Billy Wise, male    DOB: [birth year], 77 y.o.   MRN: [id number]     HPI This is a 77 year old former smoker, who follows for COPD with chronic hypoxic respiratory failure on supplemental oxygen at 2 L/min.  This is a scheduled visit.  Last seen by me at the clinic on 20 August 2021.  He has moderate to severe COPD on the basis of emphysema with associated fibrosis related to smoking.  Prior CT scan of the chest was not compatible with IPF.  At a prior visit he was noted to have increasing dyspnea the oximetry showed desaturations with exercise he was placed on 2 L/min during ambulation. Since that time his he has been compliant with oxygen therapy and dyspnea has markedly improved.  He is now able to continue his exercise program at home with supplemental oxygen.  He continues to do stationary bicycle exercise for approximately 20 minutes/day.  He is on Breztri 2 puffs twice a day and nebulizer treatments twice a day feels that this regimen continues to give him good relief of his symptoms.  He is compliant with the medication.  He has not had any recent exacerbations.  He voices no active complaint today.  No fevers, chills or sweats.  No orthopnea or paroxysmal nocturnal dyspnea.  Has not had to use albuterol at all over the last 6 months.  He had a lung cancer screening CT performed on 21 October 2022 that showed a new 5.9 mm nodule in the right upper lobe.  I reviewed these images independently and reviewed them with the patient.  He was also noted to have coronary artery calcification.  He does endorse that he has been having exertional chest pain for approximately 6 months.  He is to see cardiology in a few days.  He has tried to limit activity until he is evaluated by cardiology.    DATA: CBC 10/28/17>>eosinophils equals 0. Sleep Study 11/27/17>> no sleep apnea.  Nocturnal desaturations noted. Desat walk 11/15/17>> baseline sat on RA at rest was 91% and HR 89.  Walked 360 feet at a very brisk pace, was conversational. Sat was 88% and HR 112.  Moderate dyspnea. CT chest personally reviewed>> CT hi-res  02/01/18; chest x-ray 10/28/17; there is mild basilar fibrosis, bronchiectatic changes with bronchial thickening, severe apical emphysema. Findings not likely due to UIP.  PFT tracings personally reviewed from 02/01/18>> FVC is 86% predicted, FEV1 is 67% predicted, there is no improvement with bronchodilator.  Ratio 61%. DLCO is reduced at 52%.Flow volume loop is obstructive. Ambulatory oximetry 07/19/2020: Desaturations with exercise, initiated oxygen at 2 L/min 2D echocardiogram 07/26/2020: LVEF 60 to 65%, grade 1 DD, moderate elevation on pulmonary artery systolic pressure. PFTs 07/31/2020: FEV1 1.57 L or 57% predicted, FVC 2.79 L or 74% predicted.  FEV1/FVC 56%.  Postbronchodilator there is a 10% change in FEV1 lung volumes showed mild air trapping no hyperinflation, diffusion capacity moderately reduced.  Consistent with COPD emphysema.  Flow volume loop is delayed consistent with obstructive lung disease.   Review of Systems A 10 point review of systems was performed and it is as noted above otherwise negative.  Patient Active Problem List   Diagnosis Date Noted   Coronary artery disease of native artery of native heart with stable angina pectoris ([place]) 11/05/2022   Aortic atherosclerosis ([place]) 11/05/2022   Senile purpura ([place]) 08/14/2022   Diabetes mellitus with proteinuria ([place]) 08/14/2022   Annual physical exam 08/14/2022  Screening PSA (prostate specific antigen) 08/14/2022   Encounter for subsequent annual wellness visit (AWV) in Medicare patient 08/14/2022   Encounter for annual wellness visit (AWV) in Medicare patient 08/14/2022   Venous stasis 08/07/2021   Chest pain 01/06/2021   Other secondary pulmonary hypertension ([place]) 0000000   Diastolic dysfunction 0000000   Obesity 10/25/2019   History of anemia 04/20/2019    Gastroesophageal reflux disease without esophagitis 09/09/2018   Hyperlipidemia associated with type 2 diabetes mellitus ([place]) 09/09/2018   COPD (chronic obstructive pulmonary disease) ([place]) 10/28/2017   Hypertension associated with diabetes ([place]) 10/28/2017   Insomnia 09/03/2016   BPH with obstruction/lower urinary tract symptoms 12/16/2015   Social History   Tobacco Use   Smoking status: Former    Packs/day: 1.50    Years: 50.00    Total pack years: 75.00    Types: Cigarettes    Quit date: 09/29/2008    Years since quitting: 14.2   Smokeless tobacco: Never  Substance Use Topics   Alcohol use: Yes    Comment: 2 drinks a month / cocktail   No Known Allergies  Current Meds  Medication Sig   Accu-Chek Softclix Lancets lancets Use to check blood sugar twice a day. DX E11.9   acidophilus (RISAQUAD) CAPS capsule Take 1 capsule by mouth daily.   amLODipine (NORVASC) 5 MG tablet Take 1 tablet (5 mg total) by mouth daily.   ascorbic acid (VITAMIN C) 1000 MG tablet Take 1 tablet by mouth daily.   Blood Glucose Monitoring Suppl (ACCU-CHEK AVIVA PLUS) w/Device KIT Use to check blood sugar twice a day.  DX E11.9   BREZTRI AEROSPHERE 160-9-4.8 MCG/ACT AERO INHALE 2 PUFFS INTO THE LUNGS IN THE MORNING AND AT BEDTIME   dapagliflozin propanediol (FARXIGA) 10 MG TABS tablet Take 1 tablet (10 mg total) by mouth daily.   Dulaglutide (TRULICITY) 4.5 0000000 SOPN Inject into the skin.   finasteride (PROSCAR) 5 MG tablet Take 1 tablet (5 mg total) by mouth daily.   glucose blood (ACCU-CHEK GUIDE) test strip Use as instructed   insulin glargine (LANTUS SOLOSTAR) 100 UNIT/ML Solostar Pen Inject 53 Units into the skin at bedtime.   insulin lispro (HUMALOG) 100 UNIT/ML KwikPen Junior breakfast 22 units and supper 12 units directly with food   Insulin Pen Needle (PEN NEEDLES) 31G X 5 MM MISC 1 each by Does not apply route daily as needed.   ipratropium-albuterol (DUONEB) 0.5-2.5 (3) MG/3ML SOLN USE 3 ML  VIA NEBULIZER EVERY 6 HOURS   Lactobacillus (PROBIOTIC ACIDOPHILUS PO) Take 1 capsule by mouth daily.    metFORMIN (GLUCOPHAGE) 1000 MG tablet TAKE 1 TABLET(1000 MG) BY MOUTH TWICE DAILY   omeprazole (PRILOSEC) 20 MG capsule TAKE 1 CAPSULE(20 MG) BY MOUTH DAILY   oxybutynin (DITROPAN-XL) 10 MG 24 hr tablet TAKE 1 TABLET(10 MG) BY MOUTH DAILY   PROAIR HFA 108 (90 Base) MCG/ACT inhaler Inhale 2 puffs into the lungs every 4 (four) hours as needed for wheezing.   ramipril (ALTACE) 5 MG capsule Take 2 capsules (10 mg total) by mouth daily AND 1 capsule (5 mg total) at bedtime.   rosuvastatin (CRESTOR) 5 MG tablet Take 1 tablet (5 mg total) by mouth daily.   tamsulosin (FLOMAX) 0.4 MG CAPS capsule TAKE 2 CAPSULES(0.8 MG) BY MOUTH DAILY   zolpidem (AMBIEN) 10 MG tablet TAKE 1 TABLET(10 MG) BY MOUTH AT BEDTIME   Immunization History  Administered Date(s) Administered   COVID-19, mRNA, vaccine(Comirnaty)12 years and older 08/02/2022   Fluad  Quad(high Dose 65+) 08/05/2020, 08/02/2022   Influenza, High Dose Seasonal PF 07/24/2015, 07/21/2016, 08/02/2017, 08/06/2017, 07/27/2018   Influenza,inj,Quad PF,6+ Mos 06/27/2019   Influenza-Unspecified 09/14/2012, 07/19/2021   Moderna SARS-COV2 Booster Vaccination 07/06/2021   PFIZER(Purple Top)SARS-COV-2 Vaccination 11/10/2019, 12/01/2019, 07/14/2020   Pfizer Covid-19 Vaccine Bivalent Booster 30yr & up 01/26/2021   Pneumococcal Conjugate-13 08/25/2014   Pneumococcal Polysaccharide-23 01/26/2016   Pneumococcal-Unspecified 12/17/2005   Tdap 12/17/2005, 02/25/2016   Zoster Recombinat (Shingrix) 05/16/2019   Zoster, Live 11/24/2013         Objective:   Physical Exam BP 110/60 (BP Location: Left Arm, Cuff Size: Normal)   Pulse 78   Temp 97.8 F (36.6 C)   Ht '5\' 5"'$  (1.651 m)   Wt 184 lb 9.6 oz (83.7 kg)   SpO2 92%   BMI 30.72 kg/m   SpO2: 92 % O2 Device: Nasal cannula O2 Flow Rate (L/min): 2 L/min O2 Type: Pulse O2  GENERAL: Awake, alert, no  respiratory distress.  Fully ambulatory. HEAD: Normocephalic, atraumatic.  EYES: Pupils equal, round, reactive to light.  No scleral icterus.  MOUTH: Oral mucosa moist. NECK: Supple. No thyromegaly. Trachea midline. No JVD.  No adenopathy. PULMONARY: Good air entry bilaterally.  Coarse, dry crackles at bases, no other adventitious sounds. CARDIOVASCULAR: S1 and S2. Regular rate and rhythm.  No rubs, murmurs or gallops heard. ABDOMEN: Protuberant, truncal obesity, otherwise benign. MUSCULOSKELETAL: No joint deformity, no clubbing, trace edema.  NEUROLOGIC: No deficits, no gait disturbance, speech is fluent. SKIN: Intact,warm,dry.  Stasis changes lower extremities. PSYCH: Mood and behavior normal.     Representative image of the CT chest performed 21 October 2022, showing 5.9 mm nodule (arrow):      Assessment & Plan:     ICD-10-CM   1. Moderate COPD (chronic obstructive pulmonary disease) (HCC)  J44.9 Pulmonary Function Test [facility] Only    Alpha-1 antitrypsin phenotype   Need to reassess function with PFTs Check alpha-1 Continue Breztri, nebulizer therapy and as needed albuterol    2. Chronic respiratory failure with hypoxia (HCC)  J96.11 Pulmonary Function Test [facility] Only    Alpha-1 antitrypsin phenotype   Continue oxygen at 2 L/min Compliant notes benefit of therapy    3. Lung nodule seen on imaging study  R91.1    Scheduled for repeat CT    4. Coronary artery calcification of native artery  I25.10    I25.84    Has endorsed some chest pain Noted coronary artery calcifications on CT Has upcoming appointment with cardiology     Orders Placed This Encounter  Procedures   Alpha-1 antitrypsin phenotype    Standing Status:   Future    Standing Expiration Date:   12/16/2023   Pulmonary Function Test [facility] Only    Standing Status:   Future    Standing Expiration Date:   12/16/2023    Order Specific Question:   Full PFT: includes the following: basic spirometry, spirometry pre &  post bronchodilator, diffusion capacity (DLCO), lung volumes    Answer:   Full PFT    Order Specific Question:   This test can only be performed at    [facility]  Will see the patient in follow-up in 3 months time he is to call sooner should any new problems arise.  CRenold Don MD Advanced Bronchoscopy PCCM [place] [facility]    *This note was dictated using voice recognition software/Dragon.  Despite best efforts to proofread, errors can occur which can change the  meaning. Any transcriptional errors that result from this process are unintentional and may not be fully corrected at the time of dictation.

## 2022-12-16 ENCOUNTER — Other Ambulatory Visit: Payer: Self-pay | Admitting: Pulmonary Disease

## 2022-12-16 DIAGNOSIS — J418 Mixed simple and mucopurulent chronic bronchitis: Secondary | ICD-10-CM

## 2022-12-19 NOTE — H&P (View-Only) (Signed)
Cardiology Office Note  Date:  12/21/2022   ID:  Billy Wise, DOB 10/03/1946, MRN [id number]  PCP:  Bacigalupo, Angela M, MD   Chief Complaint  Patient presents with   NEW patient    Referred by PCP for evaluation of CAD    HPI:  [name] is a 76-year-old gentleman with past medical history of Moderate COPD, former smoker Nonobstructive coronary disease by catheterization, March 2018 normal LV function Previously followed by [facility] cardiology Diabetes type 2 Hypertension Hyperlipidemia Who presents by referral from Dr.Bacigalupo for chest pain/angina  Over the past 6 months reports he has been having anginal symptoms Symptoms worse with exertion, often associated with some shortness of breath With exercise, uses oxygen, gets a pain left chest,"discomfort", 6-8/10 Better in 5 minutes after he has to stop exercising About the same in past 6 months Due to severity of discomfort, requesting workup  CT chest images pulled up and reviewed: 3 vessel coronary calcium noted  [facility]  Coronary Angiography March 2018 1. Left Main - Normal 2. Left anterior descending artery - 10% luminal irregularities in the proximal and mid vessel. 3. Diagonals - Normal 4. Left Circumflex - large, dominant vessel that terminates in the PDA, 10% luminal irregularities. 5. Obtuse Marginals - Normal 6. Right Coronary Artery - nondominant, normal. 7. Posterior Descending Artery - from the circumflex, 10% luminal irregularities.   Lab work reviewed A1C 6.3 CR 1.2 Remote lipid panel total cholesterol 116 LDL 50, elevated triglycerides 250  EKG personally reviewed by myself on todays visit Normal sinus rhythm with rate 93 bpm unable to exclude old inferior MI, poor R wave progression through the anterior precordial leads, PVCs  PMH:   has a past medical history of Actinic keratoses, BPH (benign prostatic hyperplasia), COPD (chronic obstructive pulmonary  disease) (HCC), Diabetes mellitus without complication (HCC), GERD (gastroesophageal reflux disease), Hypertension, Insomnia, Iron deficiency anemia, Mixed hyperlipidemia, and Oxygen deficiency.  PSH:    Past Surgical History:  Procedure Laterality Date   CARDIAC CATHETERIZATION     CHOLECYSTECTOMY     COLONOSCOPY WITH PROPOFOL N/A 10/18/2018   Procedure: COLONOSCOPY WITH PROPOFOL;  Surgeon: Wohl, Darren, MD;  Location: [facility] ENDOSCOPY;  Service: Endoscopy;  Laterality: N/A;   HERNIA REPAIR      Current Outpatient Medications  Medication Sig Dispense Refill   Accu-Chek Softclix Lancets lancets Use to check blood sugar twice a day. DX E11.9 200 each 2   amLODipine (NORVASC) 5 MG tablet Take 1 tablet (5 mg total) by mouth daily. 90 tablet 3   ascorbic acid (VITAMIN C) 1000 MG tablet Take 1 tablet by mouth daily.     Blood Glucose Monitoring Suppl (ACCU-CHEK AVIVA PLUS) w/Device KIT Use to check blood sugar twice a day.  DX E11.9 1 kit 0   Budeson-Glycopyrrol-Formoterol (BREZTRI AEROSPHERE) 160-9-4.8 MCG/ACT AERO INHALE 2 PUFFS INTO THE LUNGS IN THE MORNING AND AT BEDTIME 32.1 g 3   dapagliflozin propanediol (FARXIGA) 10 MG TABS tablet Take 1 tablet (10 mg total) by mouth daily. 90 tablet 3   Dulaglutide (TRULICITY) 4.5 MG/0.5ML SOPN Inject into the skin once a week.     finasteride (PROSCAR) 5 MG tablet Take 1 tablet (5 mg total) by mouth daily. 90 tablet 3   glucose blood (ACCU-CHEK GUIDE) test strip Use as instructed 100 each 12   insulin glargine (LANTUS SOLOSTAR) 100 UNIT/ML Solostar Pen Inject 53 Units into the skin at bedtime. 15 mL 0   insulin lispro (  HUMALOG) 100 UNIT/ML KwikPen Junior breakfast 22 units and supper 12 units directly with food     Insulin Pen Needle (PEN NEEDLES) 31G X 5 MM MISC 1 each by Does not apply route daily as needed. 100 each 3   ipratropium-albuterol (DUONEB) 0.5-2.5 (3) MG/3ML SOLN USE 3 ML VIA NEBULIZER EVERY 6 HOURS (Patient taking differently: Take 3 mLs  by nebulization every 6 (six) hours as needed.) 360 mL 2   Lactobacillus (PROBIOTIC ACIDOPHILUS PO) Take 1 capsule by mouth daily.      metFORMIN (GLUCOPHAGE) 1000 MG tablet TAKE 1 TABLET(1000 MG) BY MOUTH TWICE DAILY 180 tablet 0   omeprazole (PRILOSEC) 20 MG capsule TAKE 1 CAPSULE(20 MG) BY MOUTH DAILY 90 capsule 0   oxybutynin (DITROPAN-XL) 10 MG 24 hr tablet TAKE 1 TABLET(10 MG) BY MOUTH DAILY 90 tablet 0   PROAIR HFA 108 (90 Base) MCG/ACT inhaler Inhale 2 puffs into the lungs every 4 (four) hours as needed for wheezing. 18 g 5   ramipril (ALTACE) 5 MG capsule Take 2 capsules (10 mg total) by mouth daily AND 1 capsule (5 mg total) at bedtime. 270 capsule 1   rosuvastatin (CRESTOR) 5 MG tablet Take 1 tablet (5 mg total) by mouth daily. 90 tablet 3   tamsulosin (FLOMAX) 0.4 MG CAPS capsule TAKE 2 CAPSULES(0.8 MG) BY MOUTH DAILY 180 capsule 3   zolpidem (AMBIEN) 10 MG tablet TAKE 1 TABLET(10 MG) BY MOUTH AT BEDTIME 90 tablet 1   No current facility-administered medications for this visit.    Allergies:   Patient has no known allergies.   Social History:  The patient  reports that he quit smoking about 14 years ago. His smoking use included cigarettes. He has a 75.00 pack-year smoking history. He has never used smokeless tobacco. He reports current alcohol use. He reports that he does not use drugs.   Family History:   family history includes Gout in his father; Heart attack in his sister; Hypertension in his sister.    Review of Systems: Review of Systems  Constitutional: Negative.   HENT: Negative.    Respiratory: Negative.    Cardiovascular: Negative.   Gastrointestinal: Negative.   Musculoskeletal: Negative.   Neurological: Negative.   Psychiatric/Behavioral: Negative.    All other systems reviewed and are negative.    PHYSICAL EXAM: VS:  BP 120/80 (BP Location: Right Arm, Patient Position: Sitting, Cuff Size: Normal)   Pulse 93   Ht 5' 5" (1.651 m)   Wt 185 lb (83.9 kg)    SpO2 94% Comment: on 2L O2 continuous  BMI 30.79 kg/m  , BMI Body mass index is 30.79 kg/m. GEN: Well nourished, well developed, in no acute distress HEENT: normal Neck: no JVD, carotid bruits, or masses Cardiac: RRR; no murmurs, rubs, or gallops,no edema  Respiratory:  clear to auscultation bilaterally, normal work of breathing GI: soft, nontender, nondistended, + BS MS: no deformity or atrophy Skin: warm and dry, no rash Neuro:  Strength and sensation are intact Psych: euthymic mood, full affect  Recent Labs: 08/14/2022: Hemoglobin 14.3; Platelets 195 10/22/2022: ALT 16; BUN 14; Creatinine 1.2; Potassium 4.7; Sodium 137    Lipid Panel Lab Results  Component Value Date   CHOL 116 05/26/2021   HDL 25 (A) 05/26/2021   LDLCALC 50 05/26/2021   TRIG 256 (A) 05/26/2021      Wt Readings from Last 3 Encounters:  12/21/22 185 lb (83.9 kg)  12/15/22 184 lb 9.6 oz (83.7 kg)  11/05/22   187 lb 1.6 oz (84.9 kg)     ASSESSMENT AND PLAN:  Problem List Items Addressed This Visit       Cardiology Problems   Hypertension associated with diabetes (HCC) - Primary   Coronary artery disease of native artery of native heart with stable angina pectoris (HCC)   Relevant Orders   EKG 12-Lead   CBC   Basic metabolic panel   Aortic atherosclerosis (HCC)     Other   COPD (chronic obstructive pulmonary disease) (HCC)   Angina/CAD Three-vessel coronary calcification noted on CT scan Having chest pain symptoms associated with exertion over the past 6 months, sometimes up to 8/10 in intensity, typically associated with exertion Discussed various treatment options with him Heavy coronary calcification would make cardiac CTA less appealing Given body habitus, Myoview also likely less appealing We have discussed right and left heart catheterization with him given his shortness of breath and chest pain symptoms He is in agreement, this will be scheduled next week March 12 with Dr. End I have  reviewed the risks, indications, and alternatives to cardiac catheterization, possible angioplasty, and stenting with the patient. Risks include but are not limited to bleeding, infection, vascular injury, stroke, myocardial infection, arrhythmia, kidney injury, radiation-related injury in the case of prolonged fluoroscopy use, emergency cardiac surgery, and death. The patient understands the risks of serious complication is 1-2 in 1000 with diagnostic cardiac cath and 1-2% or less with angioplasty/stenting.  -He is in agreement and willing to proceed Blood pressure low, will hold off on adding beta-blocker and long-acting nitrates at this time  COPD Reports symptoms are moderate, exercises with oxygen Followed by pulmonary, chest pain noted on recent visit December 15, 2022 Currently taking Breztri, nebulizer therapy and as needed albuterol   Essential hypertension On amlodipine 5 daily ramipril 5 twice daily  Hyperlipidemia Continue Crestor 5 daily  Diabetes type 2 with complications Managed by primary care, A1c less than 7   Total encounter time more than 30 minutes  Greater than 50% was spent in counseling and coordination of care with the patient    Signed, Tim Nobel Brar, M.D., Ph.D. [facility], [place] [phone] 

## 2022-12-19 NOTE — Progress Notes (Unsigned)
Cardiology Office Note  Date:  12/21/2022   ID:  Billy Wise, DOB [birth year], MRN [id number]  PCP:  Virginia Crews, MD   Chief Complaint  Patient presents with   NEW patient    Referred by PCP for evaluation of CAD    HPI:  [name] is a 77 year old gentleman with past medical history of Moderate COPD, former smoker Nonobstructive coronary disease by catheterization, March 2018 normal LV function Previously followed by [facility] cardiology Diabetes type 2 Hypertension Hyperlipidemia Who presents by referral from [place] for chest pain/angina  Over the past 6 months reports he has been having anginal symptoms Symptoms worse with exertion, often associated with some shortness of breath With exercise, uses oxygen, gets a pain left chest,"discomfort", 6-8/10 Better in 5 minutes after he has to stop exercising About the same in past 6 months Due to severity of discomfort, requesting workup  CT chest images pulled up and reviewed: 3 vessel coronary calcium noted  [facility]  Coronary Angiography March 2018 1. Left Main - Normal 2. Left anterior descending artery - 10% luminal irregularities in the proximal and mid vessel. 3. Diagonals - Normal 4. Left Circumflex - large, dominant vessel that terminates in the PDA, 10% luminal irregularities. 5. Obtuse Marginals - Normal 6. Right Coronary Artery - nondominant, normal. 7. Posterior Descending Artery - from the circumflex, 10% luminal irregularities.   Lab work reviewed A1C 6.3 CR 1.2 Remote lipid panel total cholesterol 116 LDL 50, elevated triglycerides 250  EKG personally reviewed by myself on todays visit Normal sinus rhythm with rate 93 bpm unable to exclude old inferior MI, poor R wave progression through the anterior precordial leads, PVCs  PMH:   has a past medical history of Actinic keratoses, BPH (benign prostatic hyperplasia), COPD (chronic obstructive pulmonary  disease) ([place]), Diabetes mellitus without complication ([place]), GERD (gastroesophageal reflux disease), Hypertension, Insomnia, Iron deficiency anemia, Mixed hyperlipidemia, and Oxygen deficiency.  PSH:    Past Surgical History:  Procedure Laterality Date   CARDIAC CATHETERIZATION     CHOLECYSTECTOMY     COLONOSCOPY WITH PROPOFOL N/A 10/18/2018   Procedure: COLONOSCOPY WITH PROPOFOL;  Surgeon: Lucilla Lame, MD;  Location: [facility] ENDOSCOPY;  Service: Endoscopy;  Laterality: N/A;   HERNIA REPAIR      Current Outpatient Medications  Medication Sig Dispense Refill   Accu-Chek Softclix Lancets lancets Use to check blood sugar twice a day. DX E11.9 200 each 2   amLODipine (NORVASC) 5 MG tablet Take 1 tablet (5 mg total) by mouth daily. 90 tablet 3   ascorbic acid (VITAMIN C) 1000 MG tablet Take 1 tablet by mouth daily.     Blood Glucose Monitoring Suppl (ACCU-CHEK AVIVA PLUS) w/Device KIT Use to check blood sugar twice a day.  DX E11.9 1 kit 0   Budeson-Glycopyrrol-Formoterol (BREZTRI AEROSPHERE) 160-9-4.8 MCG/ACT AERO INHALE 2 PUFFS INTO THE LUNGS IN THE MORNING AND AT BEDTIME 32.1 g 3   dapagliflozin propanediol (FARXIGA) 10 MG TABS tablet Take 1 tablet (10 mg total) by mouth daily. 90 tablet 3   Dulaglutide (TRULICITY) 4.5 0000000 SOPN Inject into the skin once a week.     finasteride (PROSCAR) 5 MG tablet Take 1 tablet (5 mg total) by mouth daily. 90 tablet 3   glucose blood (ACCU-CHEK GUIDE) test strip Use as instructed 100 each 12   insulin glargine (LANTUS SOLOSTAR) 100 UNIT/ML Solostar Pen Inject 53 Units into the skin at bedtime. 15 mL 0   insulin lispro (  HUMALOG) 100 UNIT/ML KwikPen Junior breakfast 22 units and supper 12 units directly with food     Insulin Pen Needle (PEN NEEDLES) 31G X 5 MM MISC 1 each by Does not apply route daily as needed. 100 each 3   ipratropium-albuterol (DUONEB) 0.5-2.5 (3) MG/3ML SOLN USE 3 ML VIA NEBULIZER EVERY 6 HOURS (Patient taking differently: Take 3 mLs  by nebulization every 6 (six) hours as needed.) 360 mL 2   Lactobacillus (PROBIOTIC ACIDOPHILUS PO) Take 1 capsule by mouth daily.      metFORMIN (GLUCOPHAGE) 1000 MG tablet TAKE 1 TABLET(1000 MG) BY MOUTH TWICE DAILY 180 tablet 0   omeprazole (PRILOSEC) 20 MG capsule TAKE 1 CAPSULE(20 MG) BY MOUTH DAILY 90 capsule 0   oxybutynin (DITROPAN-XL) 10 MG 24 hr tablet TAKE 1 TABLET(10 MG) BY MOUTH DAILY 90 tablet 0   PROAIR HFA 108 (90 Base) MCG/ACT inhaler Inhale 2 puffs into the lungs every 4 (four) hours as needed for wheezing. 18 g 5   ramipril (ALTACE) 5 MG capsule Take 2 capsules (10 mg total) by mouth daily AND 1 capsule (5 mg total) at bedtime. 270 capsule 1   rosuvastatin (CRESTOR) 5 MG tablet Take 1 tablet (5 mg total) by mouth daily. 90 tablet 3   tamsulosin (FLOMAX) 0.4 MG CAPS capsule TAKE 2 CAPSULES(0.8 MG) BY MOUTH DAILY 180 capsule 3   zolpidem (AMBIEN) 10 MG tablet TAKE 1 TABLET(10 MG) BY MOUTH AT BEDTIME 90 tablet 1   No current facility-administered medications for this visit.    Allergies:   Patient has no known allergies.   Social History:  The patient  reports that he quit smoking about 14 years ago. His smoking use included cigarettes. He has a 75.00 pack-year smoking history. He has never used smokeless tobacco. He reports current alcohol use. He reports that he does not use drugs.   Family History:   family history includes Gout in his father; Heart attack in his sister; Hypertension in his sister.    Review of Systems: Review of Systems  Constitutional: Negative.   HENT: Negative.    Respiratory: Negative.    Cardiovascular: Negative.   Gastrointestinal: Negative.   Musculoskeletal: Negative.   Neurological: Negative.   Psychiatric/Behavioral: Negative.    All other systems reviewed and are negative.    PHYSICAL EXAM: VS:  BP 120/80 (BP Location: Right Arm, Patient Position: Sitting, Cuff Size: Normal)   Pulse 93   Ht '5\' 5"'$  (1.651 m)   Wt 185 lb (83.9 kg)    SpO2 94% Comment: on 2L O2 continuous  BMI 30.79 kg/m  , BMI Body mass index is 30.79 kg/m. GEN: Well nourished, well developed, in no acute distress HEENT: normal Neck: no JVD, carotid bruits, or masses Cardiac: RRR; no murmurs, rubs, or gallops,no edema  Respiratory:  clear to auscultation bilaterally, normal work of breathing GI: soft, nontender, nondistended, + BS MS: no deformity or atrophy Skin: warm and dry, no rash Neuro:  Strength and sensation are intact Psych: euthymic mood, full affect  Recent Labs: 08/14/2022: Hemoglobin 14.3; Platelets 195 10/22/2022: ALT 16; BUN 14; Creatinine 1.2; Potassium 4.7; Sodium 137    Lipid Panel Lab Results  Component Value Date   CHOL 116 05/26/2021   HDL 25 (A) 05/26/2021   LDLCALC 50 05/26/2021   TRIG 256 (A) 05/26/2021      Wt Readings from Last 3 Encounters:  12/21/22 185 lb (83.9 kg)  12/15/22 184 lb 9.6 oz (83.7 kg)  11/05/22  187 lb 1.6 oz (84.9 kg)     ASSESSMENT AND PLAN:  Problem List Items Addressed This Visit       Cardiology Problems   Hypertension associated with diabetes ([place]) - Primary   Coronary artery disease of native artery of native heart with stable angina pectoris ([place])   Relevant Orders   EKG 12-Lead   CBC   Basic metabolic panel   Aortic atherosclerosis (HCC)     Other   COPD (chronic obstructive pulmonary disease) (HCC)   Angina/CAD Three-vessel coronary calcification noted on CT scan Having chest pain symptoms associated with exertion over the past 6 months, sometimes up to 8/10 in intensity, typically associated with exertion Discussed various treatment options with him Heavy coronary calcification would make cardiac CTA less appealing Given body habitus, Myoview also likely less appealing We have discussed right and left heart catheterization with him given his shortness of breath and chest pain symptoms He is in agreement, this will be scheduled next week March 12 with Dr. Saunders Revel I have  reviewed the risks, indications, and alternatives to cardiac catheterization, possible angioplasty, and stenting with the patient. Risks include but are not limited to bleeding, infection, vascular injury, stroke, myocardial infection, arrhythmia, kidney injury, radiation-related injury in the case of prolonged fluoroscopy use, emergency cardiac surgery, and death. The patient understands the risks of serious complication is 1-2 in [id number] with diagnostic cardiac cath and 1-2% or less with angioplasty/stenting.  -He is in agreement and willing to proceed Blood pressure low, will hold off on adding beta-blocker and long-acting nitrates at this time  COPD Reports symptoms are moderate, exercises with oxygen Followed by pulmonary, chest pain noted on recent visit December 15, 2022 Currently taking Breztri, nebulizer therapy and as needed albuterol   Essential hypertension On amlodipine 5 daily ramipril 5 twice daily  Hyperlipidemia Continue Crestor 5 daily  Diabetes type 2 with complications Managed by primary care, A1c less than 7   Total encounter time more than 30 minutes  Greater than 50% was spent in counseling and coordination of care with the patient    Signed, Esmond Plants, M.D., Ph.D. [place], [place]

## 2022-12-21 ENCOUNTER — Ambulatory Visit: Payer: Medicare PPO | Attending: Cardiovascular Disease | Admitting: Cardiovascular Disease

## 2022-12-21 ENCOUNTER — Encounter: Payer: Self-pay | Admitting: Cardiovascular Disease

## 2022-12-21 ENCOUNTER — Other Ambulatory Visit: Payer: Self-pay | Admitting: Cardiovascular Disease

## 2022-12-21 VITALS — BP 120/80 | HR 93 | Ht 65.0 in | Wt 185.0 lb

## 2022-12-21 DIAGNOSIS — J418 Mixed simple and mucopurulent chronic bronchitis: Secondary | ICD-10-CM

## 2022-12-21 DIAGNOSIS — I2089 Other forms of angina pectoris: Secondary | ICD-10-CM

## 2022-12-21 DIAGNOSIS — I152 Hypertension secondary to endocrine disorders: Secondary | ICD-10-CM

## 2022-12-21 DIAGNOSIS — E1159 Type 2 diabetes mellitus with other circulatory complications: Secondary | ICD-10-CM | POA: Diagnosis not present

## 2022-12-21 DIAGNOSIS — I700 Atherosclerosis of aorta: Secondary | ICD-10-CM | POA: Diagnosis not present

## 2022-12-21 DIAGNOSIS — I25118 Atherosclerotic heart disease of native coronary artery with other forms of angina pectoris: Secondary | ICD-10-CM | POA: Diagnosis not present

## 2022-12-21 LAB — ALPHA-1-ANTITRYPSIN PHENOTYP: A-1 Antitrypsin, Ser: 128 mg/dL (ref 101–187)

## 2022-12-21 MED ORDER — SODIUM CHLORIDE 0.9% FLUSH: 3.0000 mL | Freq: Two times a day (BID) | INTRAVENOUS | Status: AC

## 2022-12-21 NOTE — Patient Instructions (Signed)
Medication Instructions:  No changes  If you need a refill on your cardiac medications before your next appointment, please call your pharmacy.   Lab work: Your provider would like for you to have following labs drawn 7 days prior to procedure: (CBC, BMP).   Please go to the [facility] entrance and check in at the front desk.  You do not need an appointment.  They are open from 7am-6 pm.    Testing/Procedures: Your physician has requested that you have a cardiac catheterization. Cardiac catheterization is used to diagnose and/or treat various heart conditions. Doctors may recommend this procedure for a number of different reasons. The most common reason is to evaluate chest pain. Chest pain can be a symptom of coronary artery disease (CAD), and cardiac catheterization can show whether plaque is narrowing or blocking your heart's arteries. This procedure is also used to evaluate the valves, as well as measure the blood flow and oxygen levels in different parts of your heart. For further information please visit HugeFiesta.tn. Please follow instruction sheet, as given. Please see instructions below  Follow-Up: At [facility], you and your health needs are our priority.  As part of our continuing mission to provide you with exceptional heart care, we have created designated Provider Care Teams.  These Care Teams include your primary Cardiologist (physician) and Advanced Practice Providers (APPs -  Physician Assistants and Nurse Practitioners) who all work together to provide you with the care you need, when you need it.  You will need a follow up appointment in 3-4 weeks  Providers on your designated Care Team:   Murray Hodgkins, NP Christell Faith, PA-C Cadence Kathlen Mody, [place]  COVID-19 Vaccine Information can be found at: ShippingScam.co.uk For questions related to vaccine distribution or appointments, please email  [email or web address] or call [phone].    [facility] A DEPT OF [place], [place] [address] Dept: [phone] Loc: [place]  12/21/2022  You are scheduled for a Cardiac Catheterization on Tuesday, March 12 with Dr. Harrell Gave End.  1. Please arrive at the [facility] [place], [place] at 8:30 am (This is 1 hour prior to your procedure time).  Proceed to the Check-In Desk directly inside the entrance.  Procedure Parking: Use the entrance off of the [place] side of the hospital. Turn right upon entering and follow the driveway to parking that is directly in front of the [place]. There is no valet parking available at this entrance, however there is an awning directly in front of the [place] for drop off/ pick up for patients.  Special note: Every effort is made to have your procedure done on time. Please understand that emergencies sometimes delay scheduled procedures.  2. Diet: Do not eat solid foods after midnight.  The patient may have clear liquids until 5am upon the day of the procedure.  3. Labs: You will need to have blood drawn within 7 days of prior to procedure  4. Medication instructions in preparation for your procedure:   Contrast Allergy: No  Hold Trulicity 7 days prior to procedure Hold Metformin the morning of and 48 hours after procedure  On the morning of your procedure, take your Aspirin 81 mg and any morning medicines NOT listed above.  You may use sips of water.  5. Plan for one  night stay--bring personal belongings. 6. Bring a current list of your medications and current insurance cards. 7. You MUST have a responsible person to drive you home. 8. Someone MUST be with you the first 24 hours after you arrive home or your discharge will be  delayed. 9. Please wear clothes that are easy to get on and off and wear slip-on shoes.  Thank you for allowing [place] to care for you!   -- [facility]

## 2022-12-22 ENCOUNTER — Telehealth: Payer: Self-pay | Admitting: Pulmonary Disease

## 2022-12-22 ENCOUNTER — Other Ambulatory Visit
Admission: RE | Admit: 2022-12-22 | Discharge: 2022-12-22 | Disposition: A | Discharge status: Home / Self Care | Payer: Medicare PPO | Attending: Cardiovascular Disease | Admitting: Cardiovascular Disease

## 2022-12-22 DIAGNOSIS — I25118 Atherosclerotic heart disease of native coronary artery with other forms of angina pectoris: Secondary | ICD-10-CM | POA: Diagnosis not present

## 2022-12-22 LAB — CBC
HCT: 45 % (ref 39.0–52.0)
Hemoglobin: 14.6 g/dL (ref 13.0–17.0)
MCH: 26.8 pg (ref 26.0–34.0)
MCHC: 32.4 g/dL (ref 30.0–36.0)
MCV: 82.6 fL (ref 80.0–100.0)
Platelets: 185 10*3/uL (ref 150–400)
RBC: 5.45 MIL/uL (ref 4.22–5.81)
RDW: 14.9 % (ref 11.5–15.5)
WBC: 7.1 10*3/uL (ref 4.0–10.5)
nRBC: 0 % (ref 0.0–0.2)

## 2022-12-22 LAB — BASIC METABOLIC PANEL
Anion gap: 10 (ref 5–15)
BUN: 16 mg/dL (ref 8–23)
CO2: 23 mmol/L (ref 22–32)
Calcium: 8.6 mg/dL — ABNORMAL LOW (ref 8.9–10.3)
Chloride: 102 mmol/L (ref 98–111)
Creatinine, Ser: 1.04 mg/dL (ref 0.61–1.24)
GFR, Estimated: >60 mL/min (ref >60)
Glucose, Bld: 159 mg/dL — ABNORMAL HIGH (ref 70–99)
Potassium: 4.2 mmol/L (ref 3.5–5.1)
Sodium: 135 mmol/L (ref 135–145)

## 2022-12-22 NOTE — Telephone Encounter (Signed)
Tyler Pita, MD  Linwood Dibbles, CMA Cc: Hettie Holstein, CMA Alpha-1 was normal.  Patient is aware of results and voiced his understanding.  Nothing further needed.

## 2022-12-22 NOTE — Telephone Encounter (Signed)
Patient is returning phone call. Patient phone number  is [phone].

## 2022-12-29 ENCOUNTER — Encounter: Payer: Self-pay | Admitting: Internal Medicine

## 2022-12-29 ENCOUNTER — Telehealth: Payer: Self-pay

## 2022-12-29 ENCOUNTER — Ambulatory Visit
Admission: RE | Admit: 2022-12-29 | Discharge: 2022-12-29 | Disposition: A | Discharge status: Home / Self Care | Payer: Medicare PPO | Source: Ambulatory Visit | Attending: Internal Medicine | Admitting: Internal Medicine

## 2022-12-29 ENCOUNTER — Encounter
Admission: RE | Disposition: A | Discharge status: Home / Self Care | Payer: Self-pay | Source: Ambulatory Visit | Attending: Internal Medicine

## 2022-12-29 ENCOUNTER — Other Ambulatory Visit: Payer: Self-pay

## 2022-12-29 DIAGNOSIS — Z7984 Long term (current) use of oral hypoglycemic drugs: Secondary | ICD-10-CM | POA: Diagnosis not present

## 2022-12-29 DIAGNOSIS — E785 Hyperlipidemia, unspecified: Secondary | ICD-10-CM | POA: Diagnosis not present

## 2022-12-29 DIAGNOSIS — E119 Type 2 diabetes mellitus without complications: Secondary | ICD-10-CM | POA: Diagnosis not present

## 2022-12-29 DIAGNOSIS — I10 Essential (primary) hypertension: Secondary | ICD-10-CM | POA: Insufficient documentation

## 2022-12-29 DIAGNOSIS — I2089 Other forms of angina pectoris: Secondary | ICD-10-CM

## 2022-12-29 DIAGNOSIS — R0602 Shortness of breath: Secondary | ICD-10-CM

## 2022-12-29 DIAGNOSIS — Z794 Long term (current) use of insulin: Secondary | ICD-10-CM | POA: Insufficient documentation

## 2022-12-29 DIAGNOSIS — J449 Chronic obstructive pulmonary disease, unspecified: Secondary | ICD-10-CM | POA: Diagnosis not present

## 2022-12-29 DIAGNOSIS — R079 Chest pain, unspecified: Secondary | ICD-10-CM

## 2022-12-29 DIAGNOSIS — Z79899 Other long term (current) drug therapy: Secondary | ICD-10-CM | POA: Diagnosis not present

## 2022-12-29 DIAGNOSIS — I200 Unstable angina: Secondary | ICD-10-CM

## 2022-12-29 DIAGNOSIS — I25110 Atherosclerotic heart disease of native coronary artery with unstable angina pectoris: Secondary | ICD-10-CM | POA: Insufficient documentation

## 2022-12-29 DIAGNOSIS — Z87891 Personal history of nicotine dependence: Secondary | ICD-10-CM | POA: Insufficient documentation

## 2022-12-29 DIAGNOSIS — Z7985 Long-term (current) use of injectable non-insulin antidiabetic drugs: Secondary | ICD-10-CM | POA: Diagnosis not present

## 2022-12-29 HISTORY — PX: RIGHT/LEFT HEART CATH AND CORONARY ANGIOGRAPHY: CATH118266

## 2022-12-29 HISTORY — PX: INTRAVASCULAR PRESSURE WIRE/FFR STUDY: CATH118243

## 2022-12-29 HISTORY — PX: CORONARY PRESSURE/FFR STUDY: CATH118243

## 2022-12-29 LAB — GLUCOSE, CAPILLARY: Glucose-Capillary: 80 mg/dL (ref 70–99)

## 2022-12-29 SURGERY — RIGHT/LEFT HEART CATH AND CORONARY ANGIOGRAPHY
Anesthesia: Moderate Sedation

## 2022-12-29 MED ORDER — IOHEXOL 300 MG/ML  SOLN
INTRAMUSCULAR | Status: DC | PRN
  Administered 2022-12-29: 76 mL

## 2022-12-29 MED ORDER — ONDANSETRON HCL 4 MG/2ML IJ SOLN: 4.0000 mg | Freq: Four times a day (QID) | INTRAMUSCULAR | Status: DC | PRN

## 2022-12-29 MED ORDER — SODIUM CHLORIDE 0.9% FLUSH: 3.0000 mL | Freq: Two times a day (BID) | INTRAVENOUS | Status: DC

## 2022-12-29 MED ORDER — SODIUM CHLORIDE 0.9 % WEIGHT BASED INFUSION
3.0000 mL/kg/h | INTRAVENOUS | Status: AC
  Administered 2022-12-29: 3 mL/kg/h via INTRAVENOUS

## 2022-12-29 MED ORDER — ROSUVASTATIN CALCIUM 10 MG PO TABS: 10.0000 mg | ORAL_TABLET | Freq: Every day | ORAL | 3 refills | Status: DC

## 2022-12-29 MED ORDER — SODIUM CHLORIDE 0.9% FLUSH: 3.0000 mL | INTRAVENOUS | Status: DC | PRN

## 2022-12-29 MED ORDER — SODIUM CHLORIDE 0.9 % WEIGHT BASED INFUSION: 1.0000 mL/kg/h | INTRAVENOUS | Status: DC

## 2022-12-29 MED ORDER — HEPARIN (PORCINE) IN NACL 2000-0.9 UNIT/L-% IV SOLN
INTRAVENOUS | Status: DC | PRN
  Administered 2022-12-29: 1000 mL

## 2022-12-29 MED ORDER — ASPIRIN 81 MG PO TBEC: 81.0000 mg | DELAYED_RELEASE_TABLET | Freq: Every day | ORAL | Status: AC

## 2022-12-29 MED ORDER — LABETALOL HCL 5 MG/ML IV SOLN: 10.0000 mg | INTRAVENOUS | Status: DC | PRN

## 2022-12-29 MED ORDER — ACETAMINOPHEN 325 MG PO TABS: 650.0000 mg | ORAL_TABLET | ORAL | Status: DC | PRN

## 2022-12-29 MED ORDER — FENTANYL CITRATE (PF) 100 MCG/2ML IJ SOLN
INTRAMUSCULAR | Status: AC
  Filled 2022-12-29: qty 2

## 2022-12-29 MED ORDER — MIDAZOLAM HCL 2 MG/2ML IJ SOLN
INTRAMUSCULAR | Status: DC | PRN
  Administered 2022-12-29: 1 mg via INTRAVENOUS

## 2022-12-29 MED ORDER — NITROGLYCERIN 0.4 MG SL SUBL: 0.4000 mg | SUBLINGUAL_TABLET | SUBLINGUAL | 99 refills | Status: AC | PRN

## 2022-12-29 MED ORDER — HEPARIN (PORCINE) IN NACL 1000-0.9 UT/500ML-% IV SOLN
INTRAVENOUS | Status: AC
  Filled 2022-12-29: qty 1000

## 2022-12-29 MED ORDER — HEPARIN SODIUM (PORCINE) 1000 UNIT/ML IJ SOLN
INTRAMUSCULAR | Status: DC | PRN
  Administered 2022-12-29 (×2): 4000 [IU] via INTRAVENOUS
  Administered 2022-12-29: 2000 [IU] via INTRAVENOUS

## 2022-12-29 MED ORDER — HEPARIN SODIUM (PORCINE) 1000 UNIT/ML IJ SOLN
INTRAMUSCULAR | Status: AC
  Filled 2022-12-29: qty 10

## 2022-12-29 MED ORDER — SODIUM CHLORIDE 0.9 % IV SOLN: 250.0000 mL | INTRAVENOUS | Status: DC | PRN

## 2022-12-29 MED ORDER — MIDAZOLAM HCL 2 MG/2ML IJ SOLN
INTRAMUSCULAR | Status: AC
  Filled 2022-12-29: qty 2

## 2022-12-29 MED ORDER — HYDRALAZINE HCL 20 MG/ML IJ SOLN: 10.0000 mg | INTRAMUSCULAR | Status: DC | PRN

## 2022-12-29 MED ORDER — SODIUM CHLORIDE 0.9 % IV SOLN: INTRAVENOUS | Status: DC

## 2022-12-29 MED ORDER — VERAPAMIL HCL 2.5 MG/ML IV SOLN
INTRAVENOUS | Status: DC | PRN
  Administered 2022-12-29 (×2): 2.5 mg via INTRAVENOUS

## 2022-12-29 MED ORDER — ASPIRIN 81 MG PO CHEW: 81.0000 mg | CHEWABLE_TABLET | ORAL | Status: DC

## 2022-12-29 MED ORDER — ISOSORBIDE MONONITRATE ER 30 MG PO TB24: 15.0000 mg | ORAL_TABLET | Freq: Every day | ORAL | 5 refills | Status: DC

## 2022-12-29 MED ORDER — FENTANYL CITRATE (PF) 100 MCG/2ML IJ SOLN
INTRAMUSCULAR | Status: DC | PRN
  Administered 2022-12-29: 25 ug via INTRAVENOUS

## 2022-12-29 MED ORDER — VERAPAMIL HCL 2.5 MG/ML IV SOLN
INTRAVENOUS | Status: AC
  Filled 2022-12-29: qty 2

## 2022-12-29 MED ORDER — NITROGLYCERIN 1 MG/10 ML FOR IR/CATH LAB
INTRA_ARTERIAL | Status: AC
  Filled 2022-12-29: qty 10

## 2022-12-29 MED ORDER — NITROGLYCERIN 1 MG/10 ML FOR IR/CATH LAB
INTRA_ARTERIAL | Status: DC | PRN
  Administered 2022-12-29: 200 ug via INTRACORONARY

## 2022-12-29 SURGICAL SUPPLY — 17 items
CATH 5F 110X4 TIG (CATHETERS) IMPLANT
CATH BALLN WEDGE 5F 110CM (CATHETERS) IMPLANT
CATH LAUNCHER 6FR EBU3.5 (CATHETERS) IMPLANT
DEVICE RAD TR BAND REGULAR (VASCULAR PRODUCTS) IMPLANT
DRAPE BRACHIAL (DRAPES) IMPLANT
GLIDESHEATH SLEND SS 6F .021 (SHEATH) IMPLANT
GUIDEWIRE EMER 3M J .025X150CM (WIRE) IMPLANT
GUIDEWIRE INQWIRE 1.5J.035X260 (WIRE) IMPLANT
GUIDEWIRE PRESSURE X 175 (WIRE) IMPLANT
INQWIRE 1.5J .035X260CM (WIRE) ×2
KIT ENCORE 26 ADVANTAGE (KITS) IMPLANT
KIT SYRINGE INJ CVI SPIKEX1 (MISCELLANEOUS) IMPLANT
PACK CARDIAC CATH (CUSTOM PROCEDURE TRAY) ×2 IMPLANT
PROTECTION STATION PRESSURIZED (MISCELLANEOUS) ×2
SET ATX-X65L (MISCELLANEOUS) IMPLANT
SHEATH GLIDE SLENDER 4/5FR (SHEATH) IMPLANT
STATION PROTECTION PRESSURIZED (MISCELLANEOUS) IMPLANT

## 2022-12-29 NOTE — Interval H&P Note (Signed)
History and Physical Interval Note:  12/29/2022 9:41 AM  Billy Wise  has presented today for surgery, with the diagnosis of chest pain and shortness of breath.  The various methods of treatment have been discussed with the patient and family. After consideration of risks, benefits and other options for treatment, the patient has consented to  Procedure(s): RIGHT/LEFT HEART CATH AND CORONARY ANGIOGRAPHY (Bilateral) as a surgical intervention.  The patient's history has been reviewed, patient examined, no change in status, stable for surgery.  I have reviewed the patient's chart and labs.  Questions were answered to the patient's satisfaction.    Cath Lab Visit (complete for each Cath Lab visit)  Clinical Evaluation Leading to the Procedure:   ACS: No.  Non-ACS:    Anginal Classification: CCS III  Anti-ischemic medical therapy: Minimal Therapy (1 class of medications)  Non-Invasive Test Results: No non-invasive testing performed  Prior CABG: No previous CABG  Taryn Shellhammer

## 2022-12-29 NOTE — Progress Notes (Signed)
Care Coordination Pharmacy Assistant   Name: Billy Wise  MRN: [id number] DOB: 02-05-1946  Reason for Encounter: COPD Disease State   Recent office visits:  None ID  Recent consult visits:  12/21/2022 Ida Rogue, MD (Cardiology) for Initial New Patient Visit- No medication changes noted, Lab orders placed, Surgery order placed, patient to follow-up in 3-4 weeks  12/15/2022 Vernard Gambles, MD (Pulmonary) for Follow-up- No medication changes noted, Lab orders placed, Pulmonary Function Test order placed, Patient to follow-up in 3 months  Hospital visits:  None in previous 6 months  Medications: Facility-Administered Encounter Medications as of 12/29/2022  Medication   0.9 %  sodium chloride infusion   0.9 %  sodium chloride infusion   0.9% sodium chloride infusion   acetaminophen (TYLENOL) tablet 650 mg   [START ON 12/30/2022] aspirin chewable tablet 81 mg   fentaNYL (SUBLIMAZE) injection   Heparin (Porcine) in NaCl 2000-0.9 UNIT/L-% SOLN   heparin sodium (porcine) injection   iohexol (OMNIPAQUE) 300 MG/ML solution   midazolam (VERSED) injection   nitroGLYCERIN 1 mg/10 mL (100 mcg/mL) - IR/CATH LAB   ondansetron (ZOFRAN) injection 4 mg   sodium chloride flush (NS) 0.9 % injection 3 mL   sodium chloride flush (NS) 0.9 % injection 3 mL   sodium chloride flush (NS) 0.9 % injection 3 mL   sodium chloride flush (NS) 0.9 % injection 3 mL   verapamil (ISOPTIN) injection   Outpatient Encounter Medications as of 12/29/2022  Medication Sig   Accu-Chek Softclix Lancets lancets Use to check blood sugar twice a day. DX E11.9   amLODipine (NORVASC) 5 MG tablet Take 1 tablet (5 mg total) by mouth daily.   ascorbic acid (VITAMIN C) 1000 MG tablet Take 1 tablet by mouth daily.   aspirin EC 81 MG tablet Take 1 tablet (81 mg total) by mouth daily. Swallow whole.   Blood Glucose Monitoring Suppl (ACCU-CHEK AVIVA PLUS) w/Device KIT Use to check blood sugar twice a day.  DX E11.9    Budeson-Glycopyrrol-Formoterol (BREZTRI AEROSPHERE) 160-9-4.8 MCG/ACT AERO INHALE 2 PUFFS INTO THE LUNGS IN THE MORNING AND AT BEDTIME   dapagliflozin propanediol (FARXIGA) 10 MG TABS tablet Take 1 tablet (10 mg total) by mouth daily.   Dulaglutide (TRULICITY) 4.5 0000000 SOPN Inject into the skin once a week.   finasteride (PROSCAR) 5 MG tablet Take 1 tablet (5 mg total) by mouth daily.   glucose blood (ACCU-CHEK GUIDE) test strip Use as instructed   insulin glargine (LANTUS SOLOSTAR) 100 UNIT/ML Solostar Pen Inject 53 Units into the skin at bedtime.   insulin lispro (HUMALOG) 100 UNIT/ML KwikPen Junior breakfast 22 units and supper 12 units directly with food   Insulin Pen Needle (PEN NEEDLES) 31G X 5 MM MISC 1 each by Does not apply route daily as needed.   ipratropium-albuterol (DUONEB) 0.5-2.5 (3) MG/3ML SOLN USE 3 ML VIA NEBULIZER EVERY 6 HOURS (Patient taking differently: Take 3 mLs by nebulization every 6 (six) hours as needed.)   isosorbide mononitrate (IMDUR) 30 MG 24 hr tablet Take 0.5 tablets (15 mg total) by mouth daily.   Lactobacillus (PROBIOTIC ACIDOPHILUS PO) Take 1 capsule by mouth daily.    metFORMIN (GLUCOPHAGE) 1000 MG tablet TAKE 1 TABLET(1000 MG) BY MOUTH TWICE DAILY   nitroGLYCERIN (NITROSTAT) 0.4 MG SL tablet Place 1 tablet (0.4 mg total) under the tongue every 5 (five) minutes as needed for chest pain.   omeprazole (PRILOSEC) 20 MG capsule TAKE 1 CAPSULE(20 MG) BY MOUTH DAILY  oxybutynin (DITROPAN-XL) 10 MG 24 hr tablet TAKE 1 TABLET(10 MG) BY MOUTH DAILY   PROAIR HFA 108 (90 Base) MCG/ACT inhaler Inhale 2 puffs into the lungs every 4 (four) hours as needed for wheezing.   ramipril (ALTACE) 5 MG capsule Take 2 capsules (10 mg total) by mouth daily AND 1 capsule (5 mg total) at bedtime.   rosuvastatin (CRESTOR) 10 MG tablet Take 1 tablet (10 mg total) by mouth daily.   tamsulosin (FLOMAX) 0.4 MG CAPS capsule TAKE 2 CAPSULES(0.8 MG) BY MOUTH DAILY   zolpidem (AMBIEN) 10  MG tablet TAKE 1 TABLET(10 MG) BY MOUTH AT BEDTIME   Current COPD regimen:  Breztri 2 puffs twice daily  DuoNeb ProAir HFA 2 puffs every 4 hours as needed      09/18/2020    8:26 AM  CAT Score  Total CAT Score 15   Any recent hospitalizations or ED visits since last visit with CPP? No Reports denies COPD symptoms, including Increased shortness of breath , Shortness of breath at rest, Symptoms worse at night, and Wheezing What recent interventions/DTPs have been made by any provider to improve breathing since last visit: None ID  Have you had exacerbation/flare-up since last visit? No  What do you do when you are short of breath?  Adhere to COPD Action Plan, Rescue medication, and Rest  Respiratory Devices/Equipment Do you have a nebulizer? Yes Do you use a Peak Flow Meter? No Do you use a maintenance inhaler? Yes How often do you forget to use your daily inhaler? Never Do you use a rescue inhaler? Yes How often do you use your rescue inhaler?  daily Do you use a spacer with your inhaler? No  Adherence Review: Does the patient have >5 day gap between last estimated fill date for maintenance inhaler medications? No  Patient reports that he is feeling well for the most part. Denies any ill symptoms at this time. Patient is having surgery today. Patient has no concerns or issues at this time.   Scheduled CPP follow-up for 05/03/2023 @ 1300.  Lynann Bologna, CPA/CMA Clinical Pharmacist Assistant Phone: [phone]

## 2022-12-30 ENCOUNTER — Encounter: Payer: Self-pay | Admitting: Internal Medicine

## 2022-12-30 LAB — POCT I-STAT EG7
Acid-Base Excess: 1 mmol/L (ref 0.0–2.0)
Bicarbonate: 27 mmol/L (ref 20.0–28.0)
Calcium, Ion: 1.18 mmol/L (ref 1.15–1.40)
HCT: 42 % (ref 39.0–52.0)
Hemoglobin: 14.3 g/dL (ref 13.0–17.0)
O2 Saturation: 65 %
Potassium: 4 mmol/L (ref 3.5–5.1)
Sodium: 140 mmol/L (ref 135–145)
TCO2: 28 mmol/L (ref 22–32)
pCO2, Ven: 48.1 mmHg (ref 44–60)
pH, Ven: 7.358 (ref 7.25–7.43)
pO2, Ven: 36 mmHg (ref 32–45)

## 2022-12-30 LAB — POCT I-STAT 7, (LYTES, BLD GAS, ICA,H+H)
Acid-base deficit: 1 mmol/L (ref 0.0–2.0)
Bicarbonate: 24 mmol/L (ref 20.0–28.0)
Calcium, Ion: 1.14 mmol/L — ABNORMAL LOW (ref 1.15–1.40)
HCT: 41 % (ref 39.0–52.0)
Hemoglobin: 13.9 g/dL (ref 13.0–17.0)
O2 Saturation: 91 %
Potassium: 3.9 mmol/L (ref 3.5–5.1)
Sodium: 141 mmol/L (ref 135–145)
TCO2: 25 mmol/L (ref 22–32)
pCO2 arterial: 39.4 mmHg (ref 32–48)
pH, Arterial: 7.392 (ref 7.35–7.45)
pO2, Arterial: 62 mmHg — ABNORMAL LOW (ref 83–108)

## 2022-12-30 LAB — POCT ACTIVATED CLOTTING TIME: Activated Clotting Time: 266 seconds

## 2023-01-12 ENCOUNTER — Other Ambulatory Visit: Payer: Self-pay | Admitting: Family Medicine

## 2023-01-14 DIAGNOSIS — E119 Type 2 diabetes mellitus without complications: Secondary | ICD-10-CM | POA: Diagnosis not present

## 2023-01-14 DIAGNOSIS — H2513 Age-related nuclear cataract, bilateral: Secondary | ICD-10-CM | POA: Diagnosis not present

## 2023-01-14 DIAGNOSIS — D3131 Benign neoplasm of right choroid: Secondary | ICD-10-CM | POA: Diagnosis not present

## 2023-01-14 DIAGNOSIS — H43813 Vitreous degeneration, bilateral: Secondary | ICD-10-CM | POA: Diagnosis not present

## 2023-01-14 LAB — HM DIABETES EYE EXAM

## 2023-01-25 NOTE — Progress Notes (Signed)
Cardiology Office Note:   Date:  01/26/2023  ID:  [name], DOB 05-08-1946, MRN [id number]  History of Present Illness:   Billy Wise is a 77 y.o. male with a past medical history of moderate COPD, former smoker, nonobstructive coronary artery disease, type II diabetes, hypertension, hyperlipidemia, who is here today for follow-up after recent left heart catheterization.   He was last seen in clinic 12/21/22 by Dr Mariah Milling with complaints of chest pain for over the last 6 months. With his continued symptoms concerning for unstable angina and three vessel disease noted on CT scan he was scheduled for a R/LHC with Dr End on 12/29/22. His cath revealed moderate multivessel disease involving the mid LAD and he was started on imdur and rosuvastatin was increased for secondary prevention.  He returns to clinic today stating that overall he has been doing well.  Since his procedure he denies any further chest pain.  He continues to shortness of breath and cough related to COPD.  He does have upcoming follow-up with pulmonary and repeat PFTs.  He does endorse occasional swelling to his bilateral lower extremities that accumulates during the day that resolves overnight.  Denies any pain or discomfort to his right wrist.  Has been tolerating the increase in his cholesterol medication without any muscle pains and has been tolerating Imdur without headaches or chest discomfort.  ROS: 10 point review of systems was completed and is considered negative with exception was listed in the HPI  Studies Reviewed:    EKG: Sinus rhythm with occasional unifocal PVCs with a rate of 90, left anterior fascicular block  R/LHC 12/29/22 Conclusions: Moderate multivessel coronary artery disease involving mid LAD and dominant LCx.  Most significant disease of up to 50-60% involving the mid LAD is borderline significant by RFR (0.90).  OM3 disease is not hemodynamically significant (RFR = 0.99). Upper normal to mildly  elevated left heart filling pressures (PCWP 19 mmHg, LVEDP 15 mmHg). Mildly to moderately elevated right heart and pulmonary artery pressures (mean PA 12 mmHg, RVEDP 10 mmHg, mean PAP 31 mmHg). Normal Fick cardiac output/index.   Recommendations: Add isosorbide mononitrate for antianginal therapy.  If angina persists despite optimization of antianginal therapy, PCI to the mid LAD could be considered. Increase rosuvastatin to 10 mg daily for more aggressive secondary prevention.  Lipid panel and ALT should be repeated in ~3 months to assess response. Continue management of COPD, which is most likely cause of pulmonary hypertension.   LHC from outside facility Coronary Angiography March 2018 1. Left Main - Normal 2. Left anterior descending artery - 10% luminal irregularities in the proximal and mid vessel. 3. Diagonals - Normal 4. Left Circumflex - large, dominant vessel that terminates in the PDA, 10% luminal irregularities. 5. Obtuse Marginals - Normal 6. Right Coronary Artery - nondominant, normal. 7. Posterior Descending Artery - from the circumflex, 10% luminal irregularities.   Risk Assessment/Calculations:              Physical Exam:   VS:  BP (!) 110/54 (BP Location: Left Arm, Patient Position: Sitting, Cuff Size: Normal)   Pulse 90   Ht 5\' 6"  (1.676 m)   Wt 182 lb 6.4 oz (82.7 kg)   SpO2 98%   BMI 29.44 kg/m    Wt Readings from Last 3 Encounters:  01/26/23 182 lb 6.4 oz (82.7 kg)  12/29/22 180 lb (81.6 kg)  12/21/22 185 lb (83.9 kg)     GEN: Well nourished, well developed  in no acute distress NECK: No JVD; No carotid bruits CARDIAC: RRR, no murmurs, rubs, gallops RESPIRATORY:  Clear to auscultation without rales, wheezing or rhonchi  ABDOMEN: Soft, non-tender, non-distended EXTREMITIES:  trace pretibial edema; No deformity   ASSESSMENT AND PLAN:   Coronary artery disease with stable angina.  He just recently undergone right and left heart catheterization which  revealed moderate multivessel coronary artery disease involving the mid LAD and dominant left circumflex.  Significant disease is up to 50 to 60% on the mid LAD is borderline significant, OM 3 disease is not hemodynamically significant.  Upper normal to mildly elevated left heart filling pressures, mild to moderately elevated right heart and pulmonary artery pressures.  Did discuss with patient if he continues with chest discomfort that he would likely need intervention done to the mid LAD.  He was started on Imdur 15 mg daily which she has been tolerating well without recurrence of chest discomfort.  He has been continued on aspirin 81 mg daily and rosuvastatin 10 mg daily which was increased.  EKG today reveals sinus rhythm with no ischemic changes as well.  Essential hypertension with blood pressure 110/54.  He is continued on ramipril 10 daily and 5 mg at bedtime, Imdur 15 mg daily, amlodipine 5 mg daily.  Beta-blockers were added to his medication regimen with systolic blood pressure.  He is encouraged to continue to monitor pressures at home.  Hyperlipidemia with continued rosuvastatin 10 mg daily.  He will need repeat lipid and hepatic panel in 3 months after dose increased.  COPD with continue 2 L of oxygen at night.  He reports symptoms are moderate with exercise.  When he does exert himself he does wear his oxygen.  He continues to be followed by pulmonary.  States that he has outpatient PFTs scheduled.  Been continued on his current medication regimen for COPD without changes.  Type 2 diabetes with his last hemoglobin A1c less than 7.  He is continued on Farxiga, Trulicity, Lantus, and Humalog, and metformin.  This continues to be managed by his PCP.  Disposition patient return to clinic to see MD/APP in 3 months or sooner if needed.        Signed, Delanda Bulluck, NP

## 2023-01-26 ENCOUNTER — Ambulatory Visit: Payer: Medicare PPO | Attending: Cardiology | Admitting: Cardiology

## 2023-01-26 ENCOUNTER — Encounter: Payer: Self-pay | Admitting: Cardiology

## 2023-01-26 VITALS — BP 110/54 | HR 90 | Ht 66.0 in | Wt 182.4 lb

## 2023-01-26 DIAGNOSIS — I152 Hypertension secondary to endocrine disorders: Secondary | ICD-10-CM | POA: Diagnosis not present

## 2023-01-26 DIAGNOSIS — R809 Proteinuria, unspecified: Secondary | ICD-10-CM

## 2023-01-26 DIAGNOSIS — E1169 Type 2 diabetes mellitus with other specified complication: Secondary | ICD-10-CM | POA: Diagnosis not present

## 2023-01-26 DIAGNOSIS — E1159 Type 2 diabetes mellitus with other circulatory complications: Secondary | ICD-10-CM

## 2023-01-26 DIAGNOSIS — E785 Hyperlipidemia, unspecified: Secondary | ICD-10-CM | POA: Diagnosis not present

## 2023-01-26 DIAGNOSIS — J42 Unspecified chronic bronchitis: Secondary | ICD-10-CM

## 2023-01-26 DIAGNOSIS — J418 Mixed simple and mucopurulent chronic bronchitis: Secondary | ICD-10-CM

## 2023-01-26 DIAGNOSIS — I25118 Atherosclerotic heart disease of native coronary artery with other forms of angina pectoris: Secondary | ICD-10-CM

## 2023-01-26 DIAGNOSIS — E1129 Type 2 diabetes mellitus with other diabetic kidney complication: Secondary | ICD-10-CM | POA: Diagnosis not present

## 2023-01-26 NOTE — Patient Instructions (Signed)
Medication Instructions:  Your physician recommends that you continue on your current medications as directed. Please refer to the Current Medication list given to you today.  *If you need a refill on your cardiac medications before your next appointment, please call your pharmacy*   Lab Work: Your physician recommends that you return for lab work in: 3 months for fasting lipid panel and hepatic function panel  Medical Mall Entrance at [facility] 1st desk on the right to check in (REGISTRATION)  Lab hours: Monday- Friday (7:30 am- 5:30 pm)  If you have labs (blood work) drawn today and your tests are completely normal, you will receive your results only by: MyChart Message (if you have MyChart) OR A paper copy in the mail If you have any lab test that is abnormal or we need to change your treatment, we will call you to review the results.   Testing/Procedures: -None ordered   Follow-Up: At [facility], you and your health needs are our priority.  As part of our continuing mission to provide you with exceptional heart care, we have created designated Provider Care Teams.  These Care Teams include your primary Cardiologist (physician) and Advanced Practice Providers (APPs -  Physician Assistants and Nurse Practitioners) who all work together to provide you with the care you need, when you need it.  We recommend signing up for the patient portal called "MyChart".  Sign up information is provided on this After Visit Summary.  MyChart is used to connect with patients for Virtual Visits (Telemedicine).  Patients are able to view lab/test results, encounter notes, upcoming appointments, etc.  Non-urgent messages can be sent to your provider as well.   To learn more about what you can do with MyChart, go to [email or web address].    Your next appointment:   3 month(s)  Provider:   You may see Julien Nordmann MD or one of the following Advanced Practice Providers on your designated Care  Team:   Nicolasa Ducking, NP Eula Listen, PA-C Cadence Fransico Michael, PA-C Charlsie Quest, NP    Other Instructions -None

## 2023-01-28 DIAGNOSIS — E65 Localized adiposity: Secondary | ICD-10-CM | POA: Diagnosis not present

## 2023-01-28 DIAGNOSIS — Z794 Long term (current) use of insulin: Secondary | ICD-10-CM | POA: Diagnosis not present

## 2023-01-28 DIAGNOSIS — J449 Chronic obstructive pulmonary disease, unspecified: Secondary | ICD-10-CM | POA: Diagnosis not present

## 2023-01-28 DIAGNOSIS — Z8249 Family history of ischemic heart disease and other diseases of the circulatory system: Secondary | ICD-10-CM | POA: Diagnosis not present

## 2023-01-28 DIAGNOSIS — I2510 Atherosclerotic heart disease of native coronary artery without angina pectoris: Secondary | ICD-10-CM | POA: Diagnosis not present

## 2023-01-28 DIAGNOSIS — I10 Essential (primary) hypertension: Secondary | ICD-10-CM | POA: Diagnosis not present

## 2023-01-28 DIAGNOSIS — E785 Hyperlipidemia, unspecified: Secondary | ICD-10-CM | POA: Diagnosis not present

## 2023-01-28 DIAGNOSIS — E1121 Type 2 diabetes mellitus with diabetic nephropathy: Secondary | ICD-10-CM | POA: Diagnosis not present

## 2023-02-15 NOTE — Progress Notes (Unsigned)
I,Toa Mia S Caydan Mctavish,acting as a scribe for Shirlee Latch, MD.,have documented all relevant documentation on the behalf of Shirlee Latch, MD,as directed by  Shirlee Latch, MD while in the presence of Shirlee Latch, MD.     Established patient visit   Patient: Billy Wise   DOB: 1946-10-14   77 y.o. Male  MRN: [id number] Visit Date: 02/16/2023  Today's healthcare provider: Shirlee Latch, MD   No chief complaint on file.  Subjective    HPI  Diabetes Mellitus Type II, follow-up  Lab Results  Component Value Date   HGBA1C 6.3 10/22/2022   HGBA1C 7.3 05/26/2021   HGBA1C 7.7 (H) 08/05/2020   Last seen for diabetes 3 months ago.  Management since then includes continuing the same treatment. He reports {excellent/good/fair/poor:19665} compliance with treatment. He {is/is not:21021397} having side effects. {document side effects if present:1}  Home blood sugar records: {diabetes glucometry results:16657}  Episodes of hypoglycemia? {Yes/No:20286} {enter details if yes:1}   Current insulin regiment: none Most Recent Eye Exam: UTD  --------------------------------------------------------------------------------------------------- Hypertension, follow-up  BP Readings from Last 3 Encounters:  01/26/23 (!) 110/54  12/29/22 115/66  12/21/22 120/80   Wt Readings from Last 3 Encounters:  01/26/23 182 lb 6.4 oz (82.7 kg)  12/29/22 180 lb (81.6 kg)  12/21/22 185 lb (83.9 kg)     He was last seen for hypertension 3 months ago.  BP at that visit was ***. Management since that visit includes no changes. He reports {excellent/good/fair/poor:19665} compliance with treatment.  Outside blood pressures are {enter patient reported home BP, or 'not being checked':1}.  --------------------------------------------------------------------------------------------------- Lipid/Cholesterol, follow-up  Last Lipid Panel: Lab Results  Component Value Date   CHOL 116  05/26/2021   LDLCALC 50 05/26/2021   HDL 25 (A) 05/26/2021   TRIG 256 (A) 05/26/2021    He was last seen for this 3 months ago.  Management since that visit includes start Aspirin 81 mg and increase statin. From simvastatin to crestor 5 mg daily.  He reports {excellent/good/fair/poor:19665} compliance with treatment. He {is/is not:9024} having side effects. {document side effects if present:1}  Last metabolic panel Lab Results  Component Value Date   GLUCOSE 159 (H) 12/22/2022   NA 141 12/29/2022   K 3.9 12/29/2022   BUN 16 12/22/2022   CREATININE 1.04 12/22/2022   EGFR 63 10/22/2022   GFRNONAA >60 12/22/2022   CALCIUM 8.6 (L) 12/22/2022   AST 20 10/22/2022   ALT 16 10/22/2022   The ASCVD Risk score (Arnett DK, et al., 2019) failed to calculate for the following reasons:   The valid total cholesterol range is 130 to 320 mg/dL  ---------------------------------------------------------------------------------------------------   Medications: Outpatient Medications Prior to Visit  Medication Sig   Accu-Chek Softclix Lancets lancets Use to check blood sugar twice a day. DX E11.9   amLODipine (NORVASC) 5 MG tablet Take 1 tablet (5 mg total) by mouth daily.   ascorbic acid (VITAMIN C) 1000 MG tablet Take 1 tablet by mouth daily.   aspirin EC 81 MG tablet Take 1 tablet (81 mg total) by mouth daily. Swallow whole.   Blood Glucose Monitoring Suppl (ACCU-CHEK AVIVA PLUS) w/Device KIT Use to check blood sugar twice a day.  DX E11.9   Budeson-Glycopyrrol-Formoterol (BREZTRI AEROSPHERE) 160-9-4.8 MCG/ACT AERO INHALE 2 PUFFS INTO THE LUNGS IN THE MORNING AND AT BEDTIME   dapagliflozin propanediol (FARXIGA) 10 MG TABS tablet Take 1 tablet (10 mg total) by mouth daily.   Dulaglutide (TRULICITY) 4.5 MG/0.5ML SOPN Inject into the skin  once a week.   finasteride (PROSCAR) 5 MG tablet Take 1 tablet (5 mg total) by mouth daily.   glucose blood (ACCU-CHEK GUIDE) test strip Use as instructed    insulin glargine (LANTUS SOLOSTAR) 100 UNIT/ML Solostar Pen Inject 53 Units into the skin at bedtime.   insulin lispro (HUMALOG) 100 UNIT/ML KwikPen Junior breakfast 22 units and supper 12 units directly with food   Insulin Pen Needle (PEN NEEDLES) 31G X 5 MM MISC 1 each by Does not apply route daily as needed.   ipratropium-albuterol (DUONEB) 0.5-2.5 (3) MG/3ML SOLN USE 3 ML VIA NEBULIZER EVERY 6 HOURS (Patient taking differently: Take 3 mLs by nebulization every 6 (six) hours as needed.)   isosorbide mononitrate (IMDUR) 30 MG 24 hr tablet Take 0.5 tablets (15 mg total) by mouth daily.   Lactobacillus (PROBIOTIC ACIDOPHILUS PO) Take 1 capsule by mouth daily.    metFORMIN (GLUCOPHAGE) 1000 MG tablet TAKE 1 TABLET(1000 MG) BY MOUTH TWICE DAILY   nitroGLYCERIN (NITROSTAT) 0.4 MG SL tablet Place 1 tablet (0.4 mg total) under the tongue every 5 (five) minutes as needed for chest pain.   omeprazole (PRILOSEC) 20 MG capsule TAKE 1 CAPSULE(20 MG) BY MOUTH DAILY   oxybutynin (DITROPAN-XL) 10 MG 24 hr tablet TAKE 1 TABLET(10 MG) BY MOUTH DAILY   PROAIR HFA 108 (90 Base) MCG/ACT inhaler Inhale 2 puffs into the lungs every 4 (four) hours as needed for wheezing.   ramipril (ALTACE) 5 MG capsule Take 2 capsules (10 mg total) by mouth daily AND 1 capsule (5 mg total) at bedtime.   rosuvastatin (CRESTOR) 10 MG tablet Take 1 tablet (10 mg total) by mouth daily.   tamsulosin (FLOMAX) 0.4 MG CAPS capsule TAKE 2 CAPSULES(0.8 MG) BY MOUTH DAILY   zolpidem (AMBIEN) 10 MG tablet TAKE 1 TABLET(10 MG) BY MOUTH AT BEDTIME   Facility-Administered Medications Prior to Visit  Medication Dose Route Frequency Provider   sodium chloride flush (NS) 0.9 % injection 3 mL  3 mL Intravenous Q12H Gollan, Tollie Pizza, MD    Review of Systems  Constitutional:  Negative for appetite change and fatigue.  Eyes:  Negative for visual disturbance.  Respiratory:  Negative for chest tightness and shortness of breath.   Cardiovascular:   Negative for chest pain and leg swelling.  Gastrointestinal:  Negative for abdominal pain and constipation.  Neurological:  Negative for dizziness, light-headedness and headaches.    {Labs  Heme  Chem  Endocrine  Serology  Results Review (optional):23779}   Objective    There were no vitals taken for this visit. BP Readings from Last 3 Encounters:  01/26/23 (!) 110/54  12/29/22 115/66  12/21/22 120/80   Wt Readings from Last 3 Encounters:  01/26/23 182 lb 6.4 oz (82.7 kg)  12/29/22 180 lb (81.6 kg)  12/21/22 185 lb (83.9 kg)      Physical Exam  ***  No results found for any visits on 02/16/23.  Assessment & Plan     ***  No follow-ups on file.      {provider attestation***:1}   Shirlee Latch, MD  [facility] [phone] (phone) [phone] (fax)  [facility]

## 2023-02-16 ENCOUNTER — Encounter: Payer: Self-pay | Admitting: Family Medicine

## 2023-02-16 ENCOUNTER — Ambulatory Visit: Payer: Medicare PPO | Admitting: Family Medicine

## 2023-02-16 VITALS — BP 121/71 | HR 103 | Temp 97.8°F | Resp 16 | Wt 181.6 lb

## 2023-02-16 DIAGNOSIS — J441 Chronic obstructive pulmonary disease with (acute) exacerbation: Secondary | ICD-10-CM | POA: Diagnosis not present

## 2023-02-16 DIAGNOSIS — E1129 Type 2 diabetes mellitus with other diabetic kidney complication: Secondary | ICD-10-CM

## 2023-02-16 DIAGNOSIS — E1159 Type 2 diabetes mellitus with other circulatory complications: Secondary | ICD-10-CM | POA: Diagnosis not present

## 2023-02-16 DIAGNOSIS — R809 Proteinuria, unspecified: Secondary | ICD-10-CM | POA: Diagnosis not present

## 2023-02-16 DIAGNOSIS — G4700 Insomnia, unspecified: Secondary | ICD-10-CM

## 2023-02-16 DIAGNOSIS — I152 Hypertension secondary to endocrine disorders: Secondary | ICD-10-CM | POA: Diagnosis not present

## 2023-02-16 DIAGNOSIS — E785 Hyperlipidemia, unspecified: Secondary | ICD-10-CM

## 2023-02-16 DIAGNOSIS — E1169 Type 2 diabetes mellitus with other specified complication: Secondary | ICD-10-CM | POA: Diagnosis not present

## 2023-02-16 DIAGNOSIS — I25118 Atherosclerotic heart disease of native coronary artery with other forms of angina pectoris: Secondary | ICD-10-CM | POA: Diagnosis not present

## 2023-02-16 LAB — POCT GLYCOSYLATED HEMOGLOBIN (HGB A1C)
Est. average glucose Bld gHb Est-mCnc: 126
Hemoglobin A1C: 6 % — AB (ref 4.0–5.6)

## 2023-02-16 MED ORDER — AZITHROMYCIN 250 MG PO TABS: ORAL_TABLET | ORAL | 0 refills | Status: DC

## 2023-02-16 MED ORDER — BENZONATATE 100 MG PO CAPS: 100.0000 mg | ORAL_CAPSULE | Freq: Two times a day (BID) | ORAL | 1 refills | Status: DC | PRN

## 2023-02-16 NOTE — Assessment & Plan Note (Signed)
Previously uncontrolled - was changed to high intensity statin Continue statin Repeat FLP and LFTs Goal LDL < 70

## 2023-02-16 NOTE — Assessment & Plan Note (Signed)
Recent cardiac cath per Cardiology Medical management Continue imdur, ASA, nitro prn Continue to control BP and HLD well

## 2023-02-16 NOTE — Assessment & Plan Note (Signed)
Chronic and poorly controlled Discussed difficulty with Ambien longterm and poor sleep that results Discussed that he is on highest dose of Ambien currnetly

## 2023-02-16 NOTE — Assessment & Plan Note (Signed)
Well controlled Continue current medications Reviewed metabolic panel F/u in 6 months  

## 2023-02-16 NOTE — Assessment & Plan Note (Signed)
Chronic with current exacerbation On home O2 qhs - continue Treat with azithromycin Wants to try to avoid prednisone if possible due to elevated blood sugars - will call back if not improving with azithro

## 2023-02-16 NOTE — Assessment & Plan Note (Signed)
Well controlled with A1c 6.0 No hypoglycemia Continue current medications UTD on vaccines, eye exam, foot exam, UACR On ACEi/ARB On Statin Discussed diet and exercise F/u in 6 months

## 2023-02-17 LAB — HEPATIC FUNCTION PANEL
ALT: 17 IU/L (ref 0–44)
AST: 22 IU/L (ref 0–40)
Albumin: 4.1 g/dL (ref 3.8–4.8)
Alkaline Phosphatase: 78 IU/L (ref 44–121)
Bilirubin Total: 0.3 mg/dL (ref 0.0–1.2)
Bilirubin, Direct: 0.12 mg/dL (ref 0.00–0.40)
Total Protein: 7.3 g/dL (ref 6.0–8.5)

## 2023-02-17 LAB — LIPID PANEL
Chol/HDL Ratio: 3.4 ratio (ref 0.0–5.0)
Cholesterol, Total: 94 mg/dL — ABNORMAL LOW (ref 100–199)
HDL: 28 mg/dL — ABNORMAL LOW (ref >39)
LDL Chol Calc (NIH): 33 mg/dL (ref 0–99)
Triglycerides: 205 mg/dL — ABNORMAL HIGH (ref 0–149)
VLDL Cholesterol Cal: 33 mg/dL (ref 5–40)

## 2023-02-23 ENCOUNTER — Telehealth: Payer: Self-pay

## 2023-02-23 ENCOUNTER — Ambulatory Visit: Payer: Self-pay | Admitting: *Deleted

## 2023-02-23 NOTE — Telephone Encounter (Signed)
  Chief Complaint: Pt seen by Dr. Beryle Flock on 02/16/2023 diagnosed with a URI.  Put on Zithromax.   He is better but still has the bad cough Symptoms: Has a deep cough.  Coughing up thick yellow mucus but it's real hard to cough it up.   He is having coughing spells.   Feeling better otherwise.   Wondering if he should be put on the steroids.   Dr. Leonard Schwartz. Discussed putting him on steroids but didn't want to due to it affecting his blood sugar but if he did not get better to call back. Frequency: Fighting this URI for 4 weeks now. Pertinent Negatives: Patient denies fever Disposition: [] ED /[] Urgent Care (no appt availability in office) / [] Appointment(In office/virtual)/ []  [facility]/ [] Home Care/ [] Refused Recommended Disposition /[] [facility]/ [x]  Follow-up with PCP Additional Notes: Message sent to Dr. Beryle Flock.   Pt. Agreeable to someone calling him back.

## 2023-02-23 NOTE — Progress Notes (Signed)
Care Coordination Pharmacy Assistant   Name: Billy Wise  MRN: [id number] DOB: 07/15/1946  Reason for Encounter: COPD follow-up    Recent office visits:  02/16/2023 Shirlee Latch, MD (PCP Office Visit) for DM II- Started: Azithromycin 250 mg  take 500 mg PO daily X 1 day and then 250 mg daily X 4 days, Benzonatate 100 mg twice daily prn, Lab orders placed, Patient to follow-up in 6 months  Recent consult visits:  01/28/2023 Jacobo Forest, MD (Endocrinology) for Follow-up- No medication changes noted, No orders placed, patient to follow-up in 4 months   01/26/2023 Charlsie Quest, NP (Cardiology) for Follow-up- No medication changes noted, Lab orders placed, No follow-up noted  Hospital visits:  None in previous 6 months  Medications: Outpatient Encounter Medications as of 02/23/2023  Medication Sig   Accu-Chek Softclix Lancets lancets Use to check blood sugar twice a day. DX E11.9   amLODipine (NORVASC) 5 MG tablet Take 1 tablet (5 mg total) by mouth daily.   ascorbic acid (VITAMIN C) 1000 MG tablet Take 1 tablet by mouth daily.   aspirin EC 81 MG tablet Take 1 tablet (81 mg total) by mouth daily. Swallow whole.   azithromycin (ZITHROMAX) 250 MG tablet Take 500mg  PO daily x1d and then 250mg  daily x4 days   benzonatate (TESSALON) 100 MG capsule Take 1 capsule (100 mg total) by mouth 2 (two) times daily as needed for cough.   Blood Glucose Monitoring Suppl (ACCU-CHEK AVIVA PLUS) w/Device KIT Use to check blood sugar twice a day.  DX E11.9   Budeson-Glycopyrrol-Formoterol (BREZTRI AEROSPHERE) 160-9-4.8 MCG/ACT AERO INHALE 2 PUFFS INTO THE LUNGS IN THE MORNING AND AT BEDTIME   dapagliflozin propanediol (FARXIGA) 10 MG TABS tablet Take 1 tablet (10 mg total) by mouth daily.   Dulaglutide (TRULICITY) 4.5 MG/0.5ML SOPN Inject into the skin once a week.   finasteride (PROSCAR) 5 MG tablet Take 1 tablet (5 mg total) by mouth daily.   glucose blood (ACCU-CHEK GUIDE) test strip Use as  instructed   insulin glargine (LANTUS SOLOSTAR) 100 UNIT/ML Solostar Pen Inject 53 Units into the skin at bedtime.   insulin lispro (HUMALOG) 100 UNIT/ML KwikPen Junior breakfast 22 units and supper 12 units directly with food   Insulin Pen Needle (PEN NEEDLES) 31G X 5 MM MISC 1 each by Does not apply route daily as needed.   ipratropium-albuterol (DUONEB) 0.5-2.5 (3) MG/3ML SOLN USE 3 ML VIA NEBULIZER EVERY 6 HOURS (Patient taking differently: Take 3 mLs by nebulization every 6 (six) hours as needed.)   isosorbide mononitrate (IMDUR) 30 MG 24 hr tablet Take 0.5 tablets (15 mg total) by mouth daily.   Lactobacillus (PROBIOTIC ACIDOPHILUS PO) Take 1 capsule by mouth daily.    metFORMIN (GLUCOPHAGE) 1000 MG tablet TAKE 1 TABLET(1000 MG) BY MOUTH TWICE DAILY   nitroGLYCERIN (NITROSTAT) 0.4 MG SL tablet Place 1 tablet (0.4 mg total) under the tongue every 5 (five) minutes as needed for chest pain.   omeprazole (PRILOSEC) 20 MG capsule TAKE 1 CAPSULE(20 MG) BY MOUTH DAILY   oxybutynin (DITROPAN-XL) 10 MG 24 hr tablet TAKE 1 TABLET(10 MG) BY MOUTH DAILY   PROAIR HFA 108 (90 Base) MCG/ACT inhaler Inhale 2 puffs into the lungs every 4 (four) hours as needed for wheezing.   ramipril (ALTACE) 5 MG capsule Take 2 capsules (10 mg total) by mouth daily AND 1 capsule (5 mg total) at bedtime.   rosuvastatin (CRESTOR) 10 MG tablet Take 1 tablet (10 mg total) by mouth  daily.   tamsulosin (FLOMAX) 0.4 MG CAPS capsule TAKE 2 CAPSULES(0.8 MG) BY MOUTH DAILY   zolpidem (AMBIEN) 10 MG tablet TAKE 1 TABLET(10 MG) BY MOUTH AT BEDTIME   Facility-Administered Encounter Medications as of 02/23/2023  Medication   sodium chloride flush (NS) 0.9 % injection 3 mL   Current COPD regimen:  Breztri 2 puffs twice daily  DuoNeb ProAir HFA 2 puffs every 4 hours as needed      09/18/2020    8:26 AM  CAT Score  Total CAT Score 15   Any recent hospitalizations or ED visits since last visit with CPP? No Patient Reports  COPD  symptoms, including Increased shortness of breath  and Symptoms worse at night What recent interventions/DTPs have been made by any provider to improve breathing since last visit: None ID Have you had exacerbation/flare-up since last visit? No What do you do when you are short of breath?  Adhere to COPD Action Plan, Rescue medication, and Rest  Respiratory Devices/Equipment Do you have a nebulizer? Yes Do you use a Peak Flow Meter? No Do you use a maintenance inhaler? Yes How often do you forget to use your daily inhaler? Never Do you use a rescue inhaler? Yes How often do you use your rescue inhaler?  several times per month Do you use a spacer with your inhaler? No  Adherence Review: Does the patient have >5 day gap between last estimated fill date for maintenance inhaler medications? No    Care Gaps: None ID  Star Rating Drugs: Farxiga 10 mg last filled on 02/16/2023 for a 90-DS with [facility] Trulicity 4.5 mg last filled on 02/12/2023 for a 28-DS with [facility] Metformin 1000 mg last filled on 01/12/2023 for a 90-DS with [facility] Ramipril 5 mg last filled on 01/19/2023 for a 90-DS with [facility] Rosuvastatin 10 mg last filled on 01/29/2023 for a 90-DS with [facility]  I spoke with the patient and he reports that he still isn't feeling well. Patient stated that he still has a productive cough, and he is producing clear mucus but it is thick. Patient stated that he is on his oxygen to assist with shortness of breath. Patient stated that he did complete the course of ABX that was given to him last week, but he still feels the same.  Per patient he is rethinking taking the steroids to see if they assist with him getting better. Patient stated that he originally declined the steroids because they run his blood sugar levels up. I informed the patient that I would contact PCP's office to  inform them of how he is feeling and someone  will give him a call back shortly.  I contacted the PCP's office and provided the symptoms to the representative. The representative advised that he will send the message to PCP and have a nurse reach out to the patient.   Adelene Idler, CPA/CMA Clinical Pharmacist Assistant Phone: [phone]

## 2023-02-23 NOTE — Telephone Encounter (Signed)
Message from Randol Kern sent at 02/23/2023  2:51 PM EDT  Summary: Symptoms not improving   Pt is not feeling any better, possibly needs another round of antibiotics. May even need to try the steroids she recommended. Has COPD and his mucus is really thick.  Best contact: [phone]  Reported by Rodney Booze, assistant to [facility] Pharmacist.          Call History   Type Contact Phone/Fax User  02/23/2023 02:50 PM EDT Phone (Incoming) Daisuke, Elm" (Self) [phone] Judie Petit) Randol Kern   Reason for Disposition  [1] Known COPD or other severe lung disease (i.e., bronchiectasis, cystic fibrosis, lung surgery) AND [2] worsening symptoms (i.e., increased sputum purulence or amount, increased breathing difficulty    Saw Dr. Beryle Flock 02/16/2023 diagnosed with URI.   Put on Zithromax.   He is much better but still has a bad cough.   Wondering if he should take steroids since she didn't put him on them due to it messing up his blood sugar.  Answer Assessment - Initial Assessment Questions 1. ONSET: "When did the cough begin?"      I took the round of antibiotic Dr. Beryle Flock gave me.   It's almost 4 weeks I've been fighting this.    I have a lot of mucus and a deep cough.   I think the medicine helped.   I saw Dr. Beryle Flock last week.    She didn't put me on steroids due to my blood sugar.   It messes it up.  My coughing is better. 2. SEVERITY: "How bad is the cough today?"      I just cough and cough 3. SPUTUM: "Describe the color of your sputum" (none, dry cough; clear, white, yellow, green)     I can't get the stuff to come up.    4. HEMOPTYSIS: "Are you coughing up any blood?" If so ask: "How much?" (flecks, streaks, tablespoons, etc.)     Not asked 5. DIFFICULTY BREATHING: "Are you having difficulty breathing?" If Yes, ask: "How bad is it?" (e.g., mild, moderate, severe)    - MILD: No SOB at rest, mild SOB with walking, speaks normally in sentences, can lie down, no  retractions, pulse < 100.    - MODERATE: SOB at rest, SOB with minimal exertion and prefers to sit, cannot lie down flat, speaks in phrases, mild retractions, audible wheezing, pulse 100-120.    - SEVERE: Very SOB at rest, speaks in single words, struggling to breathe, sitting hunched forward, retractions, pulse > 120      Mild mostly short of breath with coughing.   I get coughing so much. 6. FEVER: "Do you have a fever?" If Yes, ask: "What is your temperature, how was it measured, and when did it start?"     No fever 7. CARDIAC HISTORY: "Do you have any history of heart disease?" (e.g., heart attack, congestive heart failure)      Not asked since already been seen and evaluated by PCP. 8. LUNG HISTORY: "Do you have any history of lung disease?"  (e.g., pulmonary embolus, asthma, emphysema)     COPD 9. PE RISK FACTORS: "Do you have a history of blood clots?" (or: recent major surgery, recent prolonged travel, bedridden)     Not asked 10. OTHER SYMPTOMS: "Do you have any other symptoms?" (e.g., runny nose, wheezing, chest pain)       Deep cough, thick yellow mucus that is hard to cough up. 11. PREGNANCY: "Is  there any chance you are pregnant?" "When was your last menstrual period?"       N/A 12. TRAVEL: "Have you traveled out of the country in the last month?" (e.g., travel history, exposures)       Not asked  Protocols used: Cough - Acute Productive-A-AH

## 2023-02-24 MED ORDER — DOXYCYCLINE HYCLATE 100 MG PO TABS
100.0000 mg | ORAL_TABLET | Freq: Two times a day (BID) | ORAL | 0 refills | Status: DC
Start: 2023-02-24 — End: 2023-04-30

## 2023-02-24 NOTE — Addendum Note (Signed)
Addended by: Bing Neighbors on: 02/24/2023 04:39 PM   Modules accepted: Orders

## 2023-02-24 NOTE — Telephone Encounter (Signed)
Abx prescribed for COPD exacerbation.   Please follow up with PCP in 8-10 days if symptoms not improved  Ronnald Ramp, MD  [facility]

## 2023-02-24 NOTE — Telephone Encounter (Signed)
Please Review

## 2023-02-24 NOTE — Telephone Encounter (Signed)
Patient advised.

## 2023-04-06 ENCOUNTER — Ambulatory Visit: Payer: Medicare PPO | Attending: Pulmonary Disease

## 2023-04-06 DIAGNOSIS — J449 Chronic obstructive pulmonary disease, unspecified: Secondary | ICD-10-CM

## 2023-04-06 DIAGNOSIS — J9611 Chronic respiratory failure with hypoxia: Secondary | ICD-10-CM | POA: Diagnosis not present

## 2023-04-06 LAB — PULMONARY FUNCTION TEST ARMC ONLY
DL/VA % pred: 44 %
DL/VA: 1.82 ml/min/mmHg/L
DLCO unc % pred: 37 %
DLCO unc: 8.01 ml/min/mmHg
FEF 25-75 Post: 0.54 L/sec
FEF 25-75 Pre: 0.61 L/sec
FEF2575-%Change-Post: -10 %
FEF2575-%Pred-Post: 29 %
FEF2575-%Pred-Pre: 33 %
FEV1-%Change-Post: -2 %
FEV1-%Pred-Post: 50 %
FEV1-%Pred-Pre: 51 %
FEV1-Post: 1.26 L
FEV1-Pre: 1.29 L
FEV1FVC-%Change-Post: -6 %
FEV1FVC-%Pred-Pre: 69 %
FEV6-%Change-Post: -2 %
FEV6-%Pred-Post: 76 %
FEV6-%Pred-Pre: 78 %
FEV6-Post: 2.47 L
FEV6-Pre: 2.54 L
FEV6FVC-%Change-Post: -6 %
FEV6FVC-%Pred-Post: 99 %
FEV6FVC-%Pred-Pre: 107 %
FVC-%Change-Post: 4 %
FVC-%Pred-Post: 76 %
FVC-%Pred-Pre: 73 %
FVC-Post: 2.66 L
FVC-Pre: 2.55 L
Post FEV1/FVC ratio: 47 %
Post FEV6/FVC ratio: 93 %
Pre FEV1/FVC ratio: 51 %
Pre FEV6/FVC Ratio: 100 %
RV % pred: 177 %
RV: 4.01 L
TLC % pred: 108 %
TLC: 6.52 L

## 2023-04-06 MED ORDER — ALBUTEROL SULFATE (2.5 MG/3ML) 0.083% IN NEBU
2.5000 mg | INHALATION_SOLUTION | Freq: Once | RESPIRATORY_TRACT | Status: AC
  Administered 2023-04-06: 2.5 mg via RESPIRATORY_TRACT
  Filled 2023-04-06: qty 3

## 2023-04-07 ENCOUNTER — Ambulatory Visit: Payer: Medicare PPO | Admitting: Pulmonary Disease

## 2023-04-07 ENCOUNTER — Encounter: Payer: Self-pay | Admitting: Pulmonary Disease

## 2023-04-07 VITALS — BP 120/78 | HR 83 | Temp 97.7°F | Ht 66.0 in | Wt 182.8 lb

## 2023-04-07 DIAGNOSIS — J449 Chronic obstructive pulmonary disease, unspecified: Secondary | ICD-10-CM | POA: Diagnosis not present

## 2023-04-07 DIAGNOSIS — J9611 Chronic respiratory failure with hypoxia: Secondary | ICD-10-CM

## 2023-04-07 DIAGNOSIS — I25118 Atherosclerotic heart disease of native coronary artery with other forms of angina pectoris: Secondary | ICD-10-CM | POA: Diagnosis not present

## 2023-04-07 DIAGNOSIS — R911 Solitary pulmonary nodule: Secondary | ICD-10-CM

## 2023-04-07 NOTE — Patient Instructions (Addendum)
2 L of oxygen with exercise is still good for you.  Continue using your oxygen at nighttime as well.  Continue your Breztri and nebulizers as needed.  Will see him in follow-up in 4 to 6 months time call sooner should any new problems arise.

## 2023-04-07 NOTE — Progress Notes (Addendum)
Subjective:    Patient ID: Billy Wise, male    DOB: Jun 11, 1946, 77 y.o.   MRN: [id number]  Patient Care Team: Erasmo Downer, MD as PCP - General (Family Medicine) Delorise Royals, MD as Referring Physician (Endocrinology) Francee Nodal, MD as Referring Physician (Dermatology) Lockie Mola, MD as Referring Physician (Ophthalmology) Gaspar Cola, [facility] (Inactive) as Pharmacist (Pharmacist)  Chief Complaint  Patient presents with   Follow-up    SOB with exertion. Wheezing. Cough with white sputum.    HPI This is a 77 year old former smoker, who follows for COPD with chronic hypoxic respiratory failure on supplemental oxygen at 2 L/min. This is a scheduled visit.  Last seen by me at the clinic on 15 December 2022.  He has moderate to severe COPD on the basis of emphysema with associated fibrosis related to smoking.  Prior CT scan of the chest was not compatible with IPF.  At a prior visit he was noted to have increasing dyspnea the oximetry showed desaturations with exercise he was placed on 2 L/min during ambulation. Since that time his he has been compliant with oxygen therapy and dyspnea has markedly improved.  He is on supplemental oxygen at 2 L/min with activity and with sleep he can have it off during rest. He is on Breztri 2 puffs twice a day and nebulizer treatments twice a day feels that this regimen continues to give him good relief of his symptoms.  He is compliant with the medication.  Had a minor exacerbation in April and required antibiotics.  The course had to be repeated 1 more time but has been recovered well since then.  He voices no active complaint today.  No fevers, chills or sweats.  No orthopnea or paroxysmal nocturnal dyspnea.  Rare use of albuterol perhaps once or twice a month.  He was evaluated by cardiology had a right and left heart cath on 29 December 2022 by Dr. Okey Dupre.  He has noted to have moderate multivessel coronary artery disease  involving mid LAD and dominant left circumflex.  Upper normal to mildly elevated left heart filling pressures and mildly to moderately elevated right heart and pulmonary artery pressures with a mean PAP of 31 mmHg.  Normal cardiac output/index by Fick criteria.  He had his cardiac medications adjusted.  He has not had much chest pressure since those medications were adjusted.  Per Dr. Serita Kyle note if the patient continues to have issues with angina he may need PCI.  Urology is following this closely.   He had pulmonary function testing performed yesterday.  I have reviewed the results.  This was discussed with him.  Continues to exhibit significant airway obstruction.  There has been no overt significant change from prior testing.   DATA: CBC 10/28/17>>eosinophils equals 0. Sleep Study 11/27/17>> no sleep apnea.  Nocturnal desaturations noted. Desat walk 11/15/17>> baseline sat on RA at rest was 91% and HR 89. Walked 360 feet at a very brisk pace, was conversational. Sat was 88% and HR 112.  Moderate dyspnea. CT chest personally reviewed>> CT hi-res  02/01/18; chest x-ray 10/28/17; there is mild basilar fibrosis, bronchiectatic changes with bronchial thickening, severe apical emphysema. Findings not likely due to UIP.  PFT tracings personally reviewed from 02/01/18>> FVC is 86% predicted, FEV1 is 67% predicted, there is no improvement with bronchodilator.  Ratio 61%. DLCO is reduced at 52%.Flow volume loop is obstructive. Ambulatory oximetry 07/19/2020: Desaturations with exercise, initiated oxygen at 2 L/min 2D echocardiogram  07/26/2020: LVEF 60 to 65%, grade 1 DD, moderate elevation on pulmonary artery systolic pressure. PFTs 07/31/2020: FEV1 1.57 L or 57% predicted, FVC 2.79 L or 74% predicted.  FEV1/FVC 56%.  Postbronchodilator there is a 10% change in FEV1 lung volumes showed mild air trapping no hyperinflation, diffusion capacity moderately reduced.  Consistent with COPD/emphysema.  Flow volume loop is  delayed consistent with obstructive lung disease. Alpha 1 antitrypsin 12/15/2022: MM phenotype, level 128 mg/dL PFTs 16/07/9603: FEV1 5.40 L or 51% predicted, FVC 2.55 L or 73% predicted, FEV1/FVC 51%, no significant bronchodilator response.  Lung volumes show air trapping.  Flow volume loop is consistent with obstruction.  Diffusion capacity is severely impaired.  This is consistent with COPD on the basis of emphysema.  As the prior study of 31 July 2020 there has been minimal decline in function.   Review of Systems A 10 point review of systems was performed and it is as noted above otherwise negative.   Patient Active Problem List   Diagnosis Date Noted   Accelerating angina (HCC) 12/29/2022   Coronary artery disease of native artery of native heart with stable angina pectoris (HCC) 11/05/2022   Aortic atherosclerosis (HCC) 11/05/2022   Senile purpura (HCC) 08/14/2022   Diabetes mellitus with proteinuria (HCC) 08/14/2022   Venous stasis 08/07/2021   Other secondary pulmonary hypertension (HCC) 07/26/2020   Diastolic dysfunction 07/26/2020   Obesity 10/25/2019   History of anemia 04/20/2019   Gastroesophageal reflux disease without esophagitis 09/09/2018   Hyperlipidemia associated with type 2 diabetes mellitus (HCC) 09/09/2018   COPD (chronic obstructive pulmonary disease) (HCC) 10/28/2017   Hypertension associated with diabetes (HCC) 10/28/2017   Insomnia 09/03/2016   BPH with obstruction/lower urinary tract symptoms 12/16/2015    Social History   Tobacco Use   Smoking status: Former    Packs/day: 1.50    Years: 50.00    Additional pack years: 0.00    Total pack years: 75.00    Types: Cigarettes    Quit date: 09/29/2008    Years since quitting: 14.5   Smokeless tobacco: Never  Substance Use Topics   Alcohol use: Yes    Comment: 2 drinks a month / cocktail    No Known Allergies  No outpatient medications have been marked as taking for the 04/07/23 encounter  (Appointment) with Salena Saner, MD.    Immunization History  Administered Date(s) Administered   COVID-19, mRNA, vaccine(Comirnaty)12 years and older 08/02/2022   Fluad Quad(high Dose 65+) 08/05/2020, 08/02/2022, 08/02/2022   Influenza, High Dose Seasonal PF 07/24/2015, 07/21/2016, 08/02/2017, 08/06/2017, 07/27/2018   Influenza,inj,Quad PF,6+ Mos 06/27/2019   Influenza-Unspecified 09/14/2012, 07/19/2021   Moderna SARS-COV2 Booster Vaccination 07/06/2021   PFIZER(Purple Top)SARS-COV-2 Vaccination 11/10/2019, 12/01/2019, 07/14/2020   Pfizer Covid-19 Vaccine Bivalent Booster 3yrs & up 01/26/2021   Pneumococcal Conjugate-13 08/25/2014   Pneumococcal Polysaccharide-23 01/26/2016   Pneumococcal-Unspecified 12/17/2005   Rsv, Bivalent, Protein Subunit Rsvpref,pf Verdis Frederickson) 09/05/2022   Tdap 12/17/2005, 02/25/2016   Zoster Recombinat (Shingrix) 08/23/2021, 06/05/2022   Zoster, Live 11/24/2013        Objective:  BP 120/78 (BP Location: Left Arm, Cuff Size: Normal)   Pulse 83   Temp 97.7 F (36.5 C)   Ht 5\' 6"  (1.676 m)   Wt 182 lb 12.8 oz (82.9 kg)   SpO2 90%   BMI 29.50 kg/m   SpO2: 90 % O2 Device: None (Room air)  GENERAL: Awake, alert, no respiratory distress.  Fully ambulatory. HEAD: Normocephalic, atraumatic.  EYES: Pupils equal,  round, reactive to light.  No scleral icterus.  MOUTH: Oral mucosa moist. NECK: Supple. No thyromegaly. Trachea midline. No JVD.  No adenopathy. PULMONARY: Good air entry bilaterally.  Coarse, dry crackles at bases, no other adventitious sounds. CARDIOVASCULAR: S1 and S2. Regular rate and rhythm.  No rubs, murmurs or gallops heard. ABDOMEN: Protuberant, truncal obesity, otherwise benign. MUSCULOSKELETAL: No joint deformity, no clubbing, trace edema.  NEUROLOGIC: No deficits, no gait disturbance, speech is fluent. SKIN: Intact,warm,dry.  Stasis changes lower extremities. PSYCH: Mood and behavior normal.   Ambulatory oximetry was performed  today: At rest on room air oxygen saturation was 91%, the patient ambulated at a moderate pace, completed 3 laps, O2 nadir 82%, mild to moderate shortness of breath.  Resting heart rate was 82 bpm at maximum for this exercise 104 bpm.  Patient was placed on oxygen at 2 L/min.  Patient continues to require 2 L/min with ambulation to maintain O2 sats at 90% or better.  This was corroborated during the study today.  With O2 patient did not have sensation of dyspnea.  Recent Results (from the past 2160 hour(s))  HM DIABETES EYE EXAM     Status: None   Collection Time: 01/14/23 12:00 AM  Result Value Ref Range   HM Diabetic Eye Exam No Retinopathy No Retinopathy  POCT glycosylated hemoglobin (Hb A1C)     Status: Abnormal   Collection Time: 02/16/23  8:06 AM  Result Value Ref Range   Hemoglobin A1C 6.0 (A) 4.0 - 5.6 %   Est. average glucose Bld gHb Est-mCnc 126   Lipid panel     Status: Abnormal   Collection Time: 02/16/23  8:28 AM  Result Value Ref Range   Cholesterol, Total 94 (L) 100 - 199 mg/dL   Triglycerides 409 (H) 0 - 149 mg/dL   HDL 28 (L) >81 mg/dL   VLDL Cholesterol Cal 33 5 - 40 mg/dL   LDL Chol Calc (NIH) 33 0 - 99 mg/dL   Chol/HDL Ratio 3.4 0.0 - 5.0 ratio    Comment:                                   T. Chol/HDL Ratio                                             Men  Women                               1/2 Avg.Risk  3.4    3.3                                   Avg.Risk  5.0    4.4                                2X Avg.Risk  9.6    7.1                                3X Avg.Risk 23.4   11.0   Hepatic function panel  Status: None   Collection Time: 02/16/23  8:28 AM  Result Value Ref Range   Total Protein 7.3 6.0 - 8.5 g/dL   Albumin 4.1 3.8 - 4.8 g/dL   Bilirubin Total 0.3 0.0 - 1.2 mg/dL   Bilirubin, Direct 1.61 0.00 - 0.40 mg/dL   Alkaline Phosphatase 78 44 - 121 IU/L   AST 22 0 - 40 IU/L   ALT 17 0 - 44 IU/L  Pulmonary Function Test [facility] Only     Status: None  (Preliminary result)   Collection Time: 04/06/23  9:51 AM  Result Value Ref Range   FVC-Pre 2.55 L   FVC-%Pred-Pre 73 %   FVC-Post 2.66 L   FVC-%Pred-Post 76 %   FVC-%Change-Post 4 %   FEV1-Pre 1.29 L   FEV1-%Pred-Pre 51 %   FEV1-Post 1.26 L   FEV1-%Pred-Post 50 %   FEV1-%Change-Post -2 %   FEV6-Pre 2.54 L   FEV6-%Pred-Pre 78 %   FEV6-Post 2.47 L   FEV6-%Pred-Post 76 %   FEV6-%Change-Post -2 %   Pre FEV1/FVC ratio 51 %   FEV1FVC-%Pred-Pre 69 %   Post FEV1/FVC ratio 47 %   FEV1FVC-%Change-Post -6 %   Pre FEV6/FVC Ratio 100 %   FEV6FVC-%Pred-Pre 107 %   Post FEV6/FVC ratio 93 %   FEV6FVC-%Pred-Post 99 %   FEV6FVC-%Change-Post -6 %   FEF 25-75 Pre 0.61 L/sec   FEF2575-%Pred-Pre 33 %   FEF 25-75 Post 0.54 L/sec   FEF2575-%Pred-Post 29 %   FEF2575-%Change-Post -10 %   RV 4.01 L   RV % pred 177 %   TLC 6.52 L   TLC % pred 108 %   DLCO unc 8.01 ml/min/mmHg   DLCO unc % pred 37 %   DL/VA 0.96 ml/min/mmHg/L   DL/VA % pred 44 %     Assessment & Plan:     ICD-10-CM   1. COPD, moderate (HCC) Moderate/Severe  J44.9    Continue Breztri 2 puffs twice a day Continue as needed albuterol Continue as needed nebulizer treatments with DuoNeb    2. Chronic respiratory failure with hypoxia (HCC)  J96.11    Oxygen at 2 L/min with activity and sleep May be off oxygen resting/quiet activity    3. Lung nodule seen on imaging study  R91.1    Patient is enrolled in lung cancer screening program Continue yearly follow-up    4. Coronary artery disease of native artery of native heart with stable angina pectoris (HCC)  I25.118    This issue adds complexity to his management Follows with cardiology      Will see the patient in follow-up in 4 to 6 months time he is to contact us prior to that time should any new problems arise.  Gailen Shelter, MD Advanced Bronchoscopy PCCM [facility]    *This note was dictated using voice recognition software/Dragon.   Despite best efforts to proofread, errors can occur which can change the meaning. Any transcriptional errors that result from this process are unintentional and may not be fully corrected at the time of dictation.

## 2023-04-12 ENCOUNTER — Other Ambulatory Visit: Payer: Self-pay | Admitting: Family Medicine

## 2023-04-13 NOTE — Telephone Encounter (Signed)
Requested Prescriptions  Pending Prescriptions Disp Refills   metFORMIN (GLUCOPHAGE) 1000 MG tablet [Pharmacy Med Name: METFORMIN 1000MG  TABLETS] 180 tablet 0    Sig: TAKE 1 TABLET(1000 MG) BY MOUTH TWICE DAILY     Endocrinology:  Diabetes - Biguanides Failed - 04/12/2023  6:29 AM      Failed - B12 Level in normal range and within 720 days    Vitamin B-12  Date Value Ref Range Status  09/19/2018 1,933  Final         Passed - Cr in normal range and within 360 days    Creatinine  Date Value Ref Range Status  03/29/2014 2.20 (H) 0.60 - 1.30 mg/dL Final   Creatinine, Ser  Date Value Ref Range Status  12/22/2022 1.04 0.61 - 1.24 mg/dL Final   Creatinine, Urine  Date Value Ref Range Status  10/22/2022 88.4  Final         Passed - HBA1C is between 0 and 7.9 and within 180 days    Hemoglobin A1C  Date Value Ref Range Status  02/16/2023 6.0 (A) 4.0 - 5.6 % Final  10/22/2022 6.3  Final         Passed - eGFR in normal range and within 360 days    EGFR (African American)  Date Value Ref Range Status  03/29/2014 35 (L)  Final   GFR calc Af Amer  Date Value Ref Range Status  08/05/2020 93 >59 mL/min/1.73 Final    Comment:    **In accordance with recommendations from the NKF-ASN Task force,**   [facility] is in the process of updating its eGFR calculation to the   2021 CKD-EPI creatinine equation that estimates kidney function   without a race variable.    EGFR (Non-African Amer.)  Date Value Ref Range Status  03/29/2014 30 (L)  Final    Comment:    eGFR values <72mL/min/1.73 m2 may be an indication of chronic kidney disease (CKD). Calculated eGFR is useful in patients with stable renal function. The eGFR calculation will not be reliable in acutely ill patients when serum creatinine is changing rapidly. It is not useful in  patients on dialysis. The eGFR calculation may not be applicable to patients at the low and high extremes of body sizes, pregnant women, and  vegetarians.    GFR, Estimated  Date Value Ref Range Status  12/22/2022 >60 >60 mL/min Final    Comment:    (NOTE) Calculated using the CKD-EPI Creatinine Equation (2021)    eGFR  Date Value Ref Range Status  10/22/2022 63  Final  08/14/2022 89 >59 mL/min/1.73 Final         Passed - Valid encounter within last 6 months    Recent Outpatient Visits           1 month ago Diabetes mellitus with proteinuria [facility])   [place] [facility] [place], Marzella Schlein, MD   5 months ago Coronary artery disease of native artery of native heart with stable angina pectoris [facility])   [place] [facility] [place], Marzella Schlein, MD   8 months ago Annual physical exam   [facility] Jacky Kindle, FNP   1 year ago Encounter for annual wellness visit (AWV) in Medicare patient   [facility] [place], Marzella Schlein, MD   2 years ago Orthostatic hypotension   [place] [facility] [place], Marzella Schlein, MD       Future Appointments  In 2 weeks Gollan, Tollie Pizza, MD [facility] - CBC within normal limits and completed in the last 12 months    WBC  Date Value Ref Range Status  12/22/2022 7.1 4.0 - 10.5 K/uL Final   RBC  Date Value Ref Range Status  12/22/2022 5.45 4.22 - 5.81 MIL/uL Final   Hemoglobin  Date Value Ref Range Status  12/29/2022 13.9 13.0 - 17.0 g/dL Final  16/07/9603 54.0 13.0 - 17.7 g/dL Final   HCT  Date Value Ref Range Status  12/29/2022 41.0 39.0 - 52.0 % Final   Hematocrit  Date Value Ref Range Status  08/14/2022 43.2 37.5 - 51.0 % Final   MCHC  Date Value Ref Range Status  12/22/2022 32.4 30.0 - 36.0 g/dL Final   [facility]  Date Value Ref Range Status  12/22/2022 26.8 26.0 - 34.0 pg Final   MCV  Date Value Ref Range Status  12/22/2022 82.6 80.0 - 100.0 fL Final  08/14/2022 82 79 - 97 fL Final  03/29/2014  84 80 - 100 fL Final   No results found for: "PLTCOUNTKUC", "LABPLAT", "POCPLA" RDW  Date Value Ref Range Status  12/22/2022 14.9 11.5 - 15.5 % Final  08/14/2022 14.3 11.6 - 15.4 % Final  03/29/2014 14.2 11.5 - 14.5 % Final

## 2023-04-15 ENCOUNTER — Other Ambulatory Visit: Payer: Self-pay | Admitting: Family Medicine

## 2023-04-20 ENCOUNTER — Other Ambulatory Visit
Admission: RE | Admit: 2023-04-20 | Discharge: 2023-04-20 | Disposition: A | Discharge status: Home / Self Care | Payer: Medicare PPO | Attending: Cardiology | Admitting: Cardiology

## 2023-04-20 ENCOUNTER — Encounter: Payer: Self-pay | Admitting: Pharmacist

## 2023-04-20 DIAGNOSIS — E1169 Type 2 diabetes mellitus with other specified complication: Secondary | ICD-10-CM | POA: Insufficient documentation

## 2023-04-20 DIAGNOSIS — I25118 Atherosclerotic heart disease of native coronary artery with other forms of angina pectoris: Secondary | ICD-10-CM | POA: Diagnosis not present

## 2023-04-20 DIAGNOSIS — R809 Proteinuria, unspecified: Secondary | ICD-10-CM | POA: Diagnosis not present

## 2023-04-20 DIAGNOSIS — E785 Hyperlipidemia, unspecified: Secondary | ICD-10-CM | POA: Diagnosis not present

## 2023-04-20 DIAGNOSIS — E1159 Type 2 diabetes mellitus with other circulatory complications: Secondary | ICD-10-CM | POA: Diagnosis not present

## 2023-04-20 DIAGNOSIS — I152 Hypertension secondary to endocrine disorders: Secondary | ICD-10-CM | POA: Diagnosis not present

## 2023-04-20 DIAGNOSIS — E1129 Type 2 diabetes mellitus with other diabetic kidney complication: Secondary | ICD-10-CM | POA: Insufficient documentation

## 2023-04-20 LAB — LIPID PANEL
Cholesterol: 94 mg/dL (ref 0–200)
HDL: 28 mg/dL — ABNORMAL LOW (ref >40)
LDL Cholesterol: 34 mg/dL (ref 0–99)
Total CHOL/HDL Ratio: 3.4 RATIO
Triglycerides: 158 mg/dL — ABNORMAL HIGH (ref <150)
VLDL: 32 mg/dL (ref 0–40)

## 2023-04-20 LAB — HEPATIC FUNCTION PANEL
ALT: 20 U/L (ref 0–44)
AST: 22 U/L (ref 15–41)
Albumin: 3.9 g/dL (ref 3.5–5.0)
Alkaline Phosphatase: 62 U/L (ref 38–126)
Bilirubin, Direct: <0.1 mg/dL (ref 0.0–0.2)
Total Bilirubin: 0.6 mg/dL (ref 0.3–1.2)
Total Protein: 7.7 g/dL (ref 6.5–8.1)

## 2023-04-20 NOTE — Progress Notes (Signed)
Patient previously followed by [facility] pharmacist. Per clinical review, no pharmacist appointment needed at this time. Care guide directed to contact patient and cancel appointment and notify pharmacy team of any patient concerns.  

## 2023-04-26 NOTE — Progress Notes (Signed)
Cholesterol has improved. LDL is at goal. Hepatic function remains stable. Continue current medication regimen without changes at this time.

## 2023-04-30 ENCOUNTER — Ambulatory Visit: Payer: Medicare PPO | Attending: Cardiovascular Disease | Admitting: Cardiovascular Disease

## 2023-04-30 ENCOUNTER — Encounter: Payer: Self-pay | Admitting: Cardiovascular Disease

## 2023-04-30 VITALS — BP 110/50 | HR 67 | Ht 65.0 in | Wt 183.0 lb

## 2023-04-30 DIAGNOSIS — I700 Atherosclerosis of aorta: Secondary | ICD-10-CM

## 2023-04-30 DIAGNOSIS — E1169 Type 2 diabetes mellitus with other specified complication: Secondary | ICD-10-CM | POA: Diagnosis not present

## 2023-04-30 DIAGNOSIS — I152 Hypertension secondary to endocrine disorders: Secondary | ICD-10-CM

## 2023-04-30 DIAGNOSIS — Z794 Long term (current) use of insulin: Secondary | ICD-10-CM

## 2023-04-30 DIAGNOSIS — E785 Hyperlipidemia, unspecified: Secondary | ICD-10-CM | POA: Diagnosis not present

## 2023-04-30 DIAGNOSIS — J441 Chronic obstructive pulmonary disease with (acute) exacerbation: Secondary | ICD-10-CM

## 2023-04-30 DIAGNOSIS — R809 Proteinuria, unspecified: Secondary | ICD-10-CM | POA: Diagnosis not present

## 2023-04-30 DIAGNOSIS — I25118 Atherosclerotic heart disease of native coronary artery with other forms of angina pectoris: Secondary | ICD-10-CM | POA: Diagnosis not present

## 2023-04-30 DIAGNOSIS — E1129 Type 2 diabetes mellitus with other diabetic kidney complication: Secondary | ICD-10-CM

## 2023-04-30 DIAGNOSIS — E1159 Type 2 diabetes mellitus with other circulatory complications: Secondary | ICD-10-CM

## 2023-04-30 NOTE — Patient Instructions (Signed)
Medication Instructions:  No changes  If you need a refill on your cardiac medications before your next appointment, please call your pharmacy.   Lab work: No new labs needed  Testing/Procedures: No new testing needed  Follow-Up: At [facility], you and your health needs are our priority.  As part of our continuing mission to provide you with exceptional heart care, we have created designated Provider Care Teams.  These Care Teams include your primary Cardiologist (physician) and Advanced Practice Providers (APPs -  Physician Assistants and Nurse Practitioners) who all work together to provide you with the care you need, when you need it.  You will need a follow up appointment in 12 months  Providers on your designated Care Team:   Christopher Berge, NP Ryan Dunn, PA-C Cadence Furth, PA-C  COVID-19 Vaccine Information can be found at: [email or web address] For questions related to vaccine distribution or appointments, please email [email or web address] or call [phone].   

## 2023-04-30 NOTE — Progress Notes (Signed)
Cardiology Office Note  Date:  04/30/2023   ID:  [name], DOB 1946/01/01, MRN [id number]  PCP:  Erasmo Downer, MD   Chief Complaint  Patient presents with   3 month follow up     "Doing well." Medications reviewed by the patient verbally.     HPI:  Billy Wise is a 77 year old-year-old gentleman with past medical history of Moderate COPD, former smoker Nonobstructive coronary disease by catheterization, March 2018 normal LV function Previously followed by [facility] cardiology Diabetes type 2 Hypertension Hyperlipidemia Who presents for follow-up of his coronary artery disease  Last seen by myself in clinic March 2024 Seen by one of our providers January 26, 2023  Cardiac catheterization performed December 29, 2022 Moderate multivessel coronary artery disease involving mid LAD and dominant LCx. Most significant disease of up to 50-60% involving the mid LAD is borderline significant by RFR (0.90). OM3 disease is not hemodynamically significant (RFR = 0.99).  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 Left Main - Normal 2. Left anterior descending artery - 10% luminal irregularities in the proximal and mid vessel. 3. Diagonals - Normal 4. Left Circumflex - large, dominant vessel that terminates in the PDA, 10% luminal irregularities. 5. Obtuse Marginals - Normal 6. Right Coronary Artery - nondominant, normal. 7. Posterior Descending Artery - from the circumflex, 10% luminal irregularities.   Lab work reviewed A1C 6.3 CR 1.2 Remote lipid panel total cholesterol 116 LDL 50, elevated triglycerides 250  PMH:    has a past medical history of Actinic keratoses, BPH (benign prostatic hyperplasia), COPD (chronic obstructive pulmonary disease) (HCC), Diabetes mellitus without complication (HCC), GERD (gastroesophageal reflux disease), Hypertension, Insomnia, Iron deficiency anemia, Mixed hyperlipidemia, and Oxygen deficiency.  PSH:    Past Surgical History:  Procedure Laterality Date   CARDIAC CATHETERIZATION     CHOLECYSTECTOMY     COLONOSCOPY WITH PROPOFOL N/A 10/18/2018   Procedure: COLONOSCOPY WITH PROPOFOL;  Surgeon: Midge Minium, MD;  Location: [facility];  Service: Endoscopy;  Laterality: N/A;   CORONARY PRESSURE/FFR STUDY N/A 12/29/2022   Procedure: INTRAVASCULAR PRESSURE WIRE/FFR STUDY;  Surgeon: Yvonne Kendall, MD;  Location: [facility] INVASIVE CV LAB;  Service: Cardiovascular;  Laterality: N/A;   HERNIA REPAIR     RIGHT/LEFT HEART CATH AND CORONARY ANGIOGRAPHY Bilateral 12/29/2022   Procedure: RIGHT/LEFT HEART CATH AND CORONARY ANGIOGRAPHY;  Surgeon: Yvonne Kendall, MD;  Location: [facility] INVASIVE CV LAB;  Service: Cardiovascular;  Laterality: Bilateral;    Current Outpatient Medications  Medication Sig Dispense Refill   Accu-Chek Softclix Lancets lancets Use to check blood sugar twice a day. DX E11.9 200 each 2   amLODipine (NORVASC) 5 MG tablet Take 1 tablet (5 mg total) by mouth daily. 90 tablet 3   ascorbic acid (VITAMIN C) 1000 MG tablet Take 1 tablet by mouth daily.     aspirin EC 81 MG tablet Take 1 tablet (81 mg total) by mouth daily. Swallow whole.     Blood Glucose Monitoring Suppl (ACCU-CHEK AVIVA PLUS) w/Device KIT Use to check blood sugar twice a day.  DX E11.9 1 kit 0   Budeson-Glycopyrrol-Formoterol (BREZTRI AEROSPHERE) 160-9-4.8 MCG/ACT AERO INHALE  2 PUFFS INTO THE LUNGS IN THE MORNING AND AT BEDTIME 32.1 g 3   dapagliflozin propanediol (FARXIGA) 10 MG TABS tablet Take 1 tablet (10 mg total) by mouth daily. 90 tablet 3   finasteride (PROSCAR) 5 MG tablet Take 1 tablet (5 mg  total) by mouth daily. 90 tablet 3   glucose blood (ACCU-CHEK GUIDE) test strip Use as instructed 100 each 12   insulin glargine (LANTUS SOLOSTAR) 100 UNIT/ML Solostar Pen Inject 53 Units into the skin at bedtime. 15 mL 0   insulin lispro (HUMALOG) 100 UNIT/ML KwikPen Junior breakfast 22 units and supper 12 units directly with food     Insulin Pen Needle (PEN NEEDLES) 31G X 5 MM MISC 1 each by Does not apply route daily as needed. 100 each 3   ipratropium-albuterol (DUONEB) 0.5-2.5 (3) MG/3ML SOLN USE 3 ML VIA NEBULIZER EVERY 6 HOURS (Patient taking differently: Take 3 mLs by nebulization every 6 (six) hours as needed.) 360 mL 2   isosorbide mononitrate (IMDUR) 30 MG 24 hr tablet Take 0.5 tablets (15 mg total) by mouth daily. 30 tablet 5   Lactobacillus (PROBIOTIC ACIDOPHILUS PO) Take 1 capsule by mouth daily.      metFORMIN (GLUCOPHAGE) 1000 MG tablet TAKE 1 TABLET(1000 MG) BY MOUTH TWICE DAILY 180 tablet 0   nitroGLYCERIN (NITROSTAT) 0.4 MG SL tablet Place 1 tablet (0.4 mg total) under the tongue every 5 (five) minutes as needed for chest pain. 25 tablet PRN   omeprazole (PRILOSEC) 20 MG capsule TAKE 1 CAPSULE(20 MG) BY MOUTH DAILY 90 capsule 0   oxybutynin (DITROPAN-XL) 10 MG 24 hr tablet TAKE 1 TABLET(10 MG) BY MOUTH DAILY 90 tablet 0   OZEMPIC, 0.25 OR 0.5 MG/DOSE, 2 MG/3ML SOPN Inject 0.5 mg into the skin once a week.     PROAIR HFA 108 (90 Base) MCG/ACT inhaler Inhale 2 puffs into the lungs every 4 (four) hours as needed for wheezing. 18 g 5   ramipril (ALTACE) 5 MG capsule Take 2 capsules (10 mg total) by mouth daily AND 1 capsule (5 mg total) at bedtime. 270 capsule 1   rosuvastatin (CRESTOR) 10 MG tablet Take 1 tablet (10 mg total) by mouth daily. 90 tablet 3   tamsulosin (FLOMAX) 0.4 MG CAPS capsule TAKE 2 CAPSULES(0.8 MG) BY MOUTH DAILY 180 capsule 3   zolpidem (AMBIEN) 10 MG tablet TAKE 1 TABLET(10 MG) BY MOUTH AT BEDTIME 90 tablet 1   No current facility-administered medications for  this visit.   Facility-Administered Medications Ordered in Other Visits  Medication Dose Route Frequency Provider Last Rate Last Admin   sodium chloride flush (NS) 0.9 % injection 3 mL  3 mL Intravenous Q12H Frankey Botting, Tollie Pizza, MD        Allergies:   Patient has no known allergies.   Social History:  The patient  reports that he quit smoking about 14 years ago. His smoking use included cigarettes. He started smoking about 64 years ago. He has a 75 pack-year smoking history. He has never used smokeless tobacco. He reports current alcohol use. He reports that he does not use drugs.   Family History:   family history includes Gout in his father; Heart attack in his sister; Hypertension in his sister.    Review of Systems: Review of Systems  Constitutional: Negative.   HENT: Negative.    Respiratory: Negative.    Cardiovascular: Negative.   Gastrointestinal: Negative.   Musculoskeletal: Negative.   Neurological: Negative.   Psychiatric/Behavioral: Negative.  All other systems reviewed and are negative.   PHYSICAL EXAM: VS:  BP (!) 110/50 (BP Location: Left Arm, Patient Position: Sitting, Cuff Size: Normal)   Pulse 67   Ht 5\' 5"  (1.651 m)   Wt 183 lb (83 kg)   SpO2 93%   BMI 30.45 kg/m  , BMI Body mass index is 30.45 kg/m. GEN: Well nourished, well developed, in no acute distress HEENT: normal Neck: no JVD, carotid bruits, or masses Cardiac: RRR; no murmurs, rubs, or gallops,no edema  Respiratory:  clear to auscultation bilaterally, normal work of breathing GI: soft, nontender, nondistended, + BS MS: no deformity or atrophy Skin: warm and dry, no rash Neuro:  Strength and sensation are intact Psych: euthymic mood, full affect  Recent Labs: 12/22/2022: BUN 16; Creatinine, Ser 1.04; Platelets 185 12/29/2022: Hemoglobin 13.9; Potassium 3.9; Sodium 141 04/20/2023: ALT 20    Lipid Panel Lab Results  Component Value Date   CHOL 94 04/20/2023   HDL 28 (L) 04/20/2023    LDLCALC 34 04/20/2023   TRIG 158 (H) 04/20/2023      Wt Readings from Last 3 Encounters:  04/30/23 183 lb (83 kg)  04/07/23 182 lb 12.8 oz (82.9 kg)  02/16/23 181 lb 9.6 oz (82.4 kg)     ASSESSMENT AND PLAN:  Problem List Items Addressed This Visit       Cardiology Problems   Hypertension associated with diabetes (HCC)   Hyperlipidemia associated with type 2 diabetes mellitus (HCC)   Coronary artery disease of native artery of native heart with stable angina pectoris (HCC) - Primary   Aortic atherosclerosis (HCC)     Other   COPD (chronic obstructive pulmonary disease) (HCC)   Diabetes mellitus with proteinuria (HCC)    Angina/CAD Currently with no symptoms of angina. No further workup at this time. Continue current medication regimen. Risk factors at goal, cholesterol well-controlled, nondiabetic, diabetes well-controlled A1c 6.0  COPD Reports symptoms are moderate, exercises with oxygen Followed by pulmonary,  Currently taking Breztri, nebulizer therapy and as needed albuterol  No recent COPD exacerbation  Essential hypertension On amlodipine 5 daily ramipril 10 mg in the morning 5 mg in at night isosorbide 15 mg daily  Hyperlipidemia Continue Crestor 10 daily  Diabetes type 2 with complications Managed by primary care, A1c 6.0   Total encounter time more than 30 minutes  Greater than 50% was spent in counseling and coordination of care with the patient    Signed, Dossie Arbour, M.D., Ph.D. [facility] [place], [place] [phone]

## 2023-05-04 ENCOUNTER — Ambulatory Visit
Admission: RE | Admit: 2023-05-04 | Discharge: 2023-05-04 | Disposition: A | Discharge status: Home / Self Care | Payer: Medicare PPO | Source: Ambulatory Visit | Attending: Acute Care | Admitting: Acute Care

## 2023-05-04 DIAGNOSIS — R911 Solitary pulmonary nodule: Secondary | ICD-10-CM | POA: Diagnosis not present

## 2023-05-04 DIAGNOSIS — Z87891 Personal history of nicotine dependence: Secondary | ICD-10-CM | POA: Diagnosis not present

## 2023-05-10 ENCOUNTER — Other Ambulatory Visit: Payer: Self-pay

## 2023-05-10 DIAGNOSIS — Z87891 Personal history of nicotine dependence: Secondary | ICD-10-CM

## 2023-05-10 DIAGNOSIS — Z122 Encounter for screening for malignant neoplasm of respiratory organs: Secondary | ICD-10-CM

## 2023-06-02 ENCOUNTER — Other Ambulatory Visit: Payer: Self-pay | Admitting: Family Medicine

## 2023-06-03 ENCOUNTER — Telehealth: Payer: Self-pay | Admitting: Family Medicine

## 2023-06-03 MED ORDER — OMEPRAZOLE 20 MG PO CPDR: 20.0000 mg | DELAYED_RELEASE_CAPSULE | Freq: Every day | ORAL | 2 refills | Status: DC

## 2023-06-03 NOTE — Telephone Encounter (Signed)
[facility] pharmacy is requesting prescription refill omeprazole (PRILOSEC) 20 MG capsule   Please advise

## 2023-06-15 DIAGNOSIS — D0422 Carcinoma in situ of skin of left ear and external auricular canal: Secondary | ICD-10-CM | POA: Diagnosis not present

## 2023-06-15 DIAGNOSIS — L821 Other seborrheic keratosis: Secondary | ICD-10-CM | POA: Diagnosis not present

## 2023-06-15 DIAGNOSIS — I10 Essential (primary) hypertension: Secondary | ICD-10-CM | POA: Diagnosis not present

## 2023-06-15 DIAGNOSIS — L570 Actinic keratosis: Secondary | ICD-10-CM | POA: Diagnosis not present

## 2023-06-15 DIAGNOSIS — D229 Melanocytic nevi, unspecified: Secondary | ICD-10-CM | POA: Diagnosis not present

## 2023-06-15 DIAGNOSIS — Z85828 Personal history of other malignant neoplasm of skin: Secondary | ICD-10-CM | POA: Diagnosis not present

## 2023-06-15 DIAGNOSIS — L814 Other melanin hyperpigmentation: Secondary | ICD-10-CM | POA: Diagnosis not present

## 2023-06-15 DIAGNOSIS — L905 Scar conditions and fibrosis of skin: Secondary | ICD-10-CM | POA: Diagnosis not present

## 2023-06-15 DIAGNOSIS — D492 Neoplasm of unspecified behavior of bone, soft tissue, and skin: Secondary | ICD-10-CM | POA: Diagnosis not present

## 2023-06-16 DIAGNOSIS — E1129 Type 2 diabetes mellitus with other diabetic kidney complication: Secondary | ICD-10-CM | POA: Diagnosis not present

## 2023-06-16 DIAGNOSIS — Z794 Long term (current) use of insulin: Secondary | ICD-10-CM | POA: Diagnosis not present

## 2023-06-16 DIAGNOSIS — I10 Essential (primary) hypertension: Secondary | ICD-10-CM | POA: Diagnosis not present

## 2023-06-16 DIAGNOSIS — R809 Proteinuria, unspecified: Secondary | ICD-10-CM | POA: Diagnosis not present

## 2023-06-24 DIAGNOSIS — E785 Hyperlipidemia, unspecified: Secondary | ICD-10-CM | POA: Diagnosis not present

## 2023-06-24 DIAGNOSIS — Z794 Long term (current) use of insulin: Secondary | ICD-10-CM | POA: Diagnosis not present

## 2023-06-24 DIAGNOSIS — I2510 Atherosclerotic heart disease of native coronary artery without angina pectoris: Secondary | ICD-10-CM | POA: Diagnosis not present

## 2023-06-24 DIAGNOSIS — R202 Paresthesia of skin: Secondary | ICD-10-CM | POA: Diagnosis not present

## 2023-06-24 DIAGNOSIS — E65 Localized adiposity: Secondary | ICD-10-CM | POA: Diagnosis not present

## 2023-06-24 DIAGNOSIS — R200 Anesthesia of skin: Secondary | ICD-10-CM | POA: Diagnosis not present

## 2023-06-24 DIAGNOSIS — Z8249 Family history of ischemic heart disease and other diseases of the circulatory system: Secondary | ICD-10-CM | POA: Diagnosis not present

## 2023-06-24 DIAGNOSIS — I10 Essential (primary) hypertension: Secondary | ICD-10-CM | POA: Diagnosis not present

## 2023-06-24 DIAGNOSIS — E1121 Type 2 diabetes mellitus with diabetic nephropathy: Secondary | ICD-10-CM | POA: Diagnosis not present

## 2023-06-24 DIAGNOSIS — J449 Chronic obstructive pulmonary disease, unspecified: Secondary | ICD-10-CM | POA: Diagnosis not present

## 2023-06-24 LAB — HEMOGLOBIN A1C: Hemoglobin A1C: 7

## 2023-07-03 ENCOUNTER — Other Ambulatory Visit: Payer: Self-pay | Admitting: Family Medicine

## 2023-07-03 DIAGNOSIS — E1159 Type 2 diabetes mellitus with other circulatory complications: Secondary | ICD-10-CM

## 2023-07-05 NOTE — Telephone Encounter (Signed)
Requested Prescriptions  Pending Prescriptions Disp Refills   ramipril (ALTACE) 10 MG capsule [Pharmacy Med Name: RAMIPRIL 10MG  CAPSULES] 90 capsule 1    Sig: TAKE 1 CAPSULE(10 MG) BY MOUTH DAILY     Cardiovascular:  ACE Inhibitors Failed - 07/03/2023 10:04 AM      Failed - Cr in normal range and within 180 days    Creatinine  Date Value Ref Range Status  03/29/2014 2.20 (H) 0.60 - 1.30 mg/dL Final   Creatinine, Ser  Date Value Ref Range Status  12/22/2022 1.04 0.61 - 1.24 mg/dL Final   Creatinine, Urine  Date Value Ref Range Status  10/22/2022 88.4  Final         Failed - K in normal range and within 180 days    Potassium  Date Value Ref Range Status  12/29/2022 3.9 3.5 - 5.1 mmol/L Final  03/29/2014 3.5 3.5 - 5.1 mmol/L Final         Passed - Patient is not pregnant      Passed - Last BP in normal range    BP Readings from Last 1 Encounters:  04/30/23 (!) 110/50         Passed - Valid encounter within last 6 months    Recent Outpatient Visits           4 months ago Diabetes mellitus with proteinuria [facility])   [place] [facility] [place], Marzella Schlein, MD   8 months ago Coronary artery disease of native artery of native heart with stable angina pectoris [facility])   [place] [facility] [place], Marzella Schlein, MD   10 months ago Annual physical exam   [place] [facility] Merita Norton T, FNP   1 year ago Encounter for annual wellness visit (AWV) in Medicare patient   [place] [facility] [place], Marzella Schlein, MD   2 years ago Orthostatic hypotension   [facility] [place], Marzella Schlein, MD

## 2023-07-11 ENCOUNTER — Other Ambulatory Visit: Payer: Self-pay | Admitting: Family Medicine

## 2023-07-11 DIAGNOSIS — I152 Hypertension secondary to endocrine disorders: Secondary | ICD-10-CM

## 2023-07-13 ENCOUNTER — Ambulatory Visit: Payer: Medicare PPO | Admitting: Pulmonary Disease

## 2023-07-13 ENCOUNTER — Encounter: Payer: Self-pay | Admitting: Pulmonary Disease

## 2023-07-13 ENCOUNTER — Ambulatory Visit
Admission: RE | Admit: 2023-07-13 | Discharge: 2023-07-13 | Disposition: A | Discharge status: Home / Self Care | Payer: Medicare PPO | Source: Ambulatory Visit | Attending: Pulmonary Disease | Admitting: Pulmonary Disease

## 2023-07-13 ENCOUNTER — Telehealth: Payer: Self-pay | Admitting: Pulmonary Disease

## 2023-07-13 VITALS — BP 110/62 | HR 42 | Temp 97.4°F | Ht 65.0 in | Wt 185.4 lb

## 2023-07-13 DIAGNOSIS — I499 Cardiac arrhythmia, unspecified: Secondary | ICD-10-CM | POA: Diagnosis not present

## 2023-07-13 DIAGNOSIS — R0602 Shortness of breath: Secondary | ICD-10-CM

## 2023-07-13 DIAGNOSIS — J9611 Chronic respiratory failure with hypoxia: Secondary | ICD-10-CM

## 2023-07-13 DIAGNOSIS — J019 Acute sinusitis, unspecified: Secondary | ICD-10-CM

## 2023-07-13 DIAGNOSIS — J441 Chronic obstructive pulmonary disease with (acute) exacerbation: Secondary | ICD-10-CM | POA: Diagnosis not present

## 2023-07-13 DIAGNOSIS — B9689 Other specified bacterial agents as the cause of diseases classified elsewhere: Secondary | ICD-10-CM

## 2023-07-13 DIAGNOSIS — J449 Chronic obstructive pulmonary disease, unspecified: Secondary | ICD-10-CM

## 2023-07-13 MED ORDER — ALBUTEROL SULFATE HFA 108 (90 BASE) MCG/ACT IN AERS: 2.0000 | INHALATION_SPRAY | Freq: Four times a day (QID) | RESPIRATORY_TRACT | 2 refills | Status: DC | PRN

## 2023-07-13 MED ORDER — AMOXICILLIN-POT CLAVULANATE 875-125 MG PO TABS: 1.0000 | ORAL_TABLET | Freq: Two times a day (BID) | ORAL | 0 refills | Status: AC

## 2023-07-13 NOTE — Patient Instructions (Addendum)
We are sending in a prescription for an antibiotic.  You do not have likely have a sinus infection and bronchitis.  Your heart rhythm was regular however you have frequent extra beats, this is likely because you have an acute illness at present.  You have an appointment with me on 31 October, please keep that appointment.  Call sooner if your symptoms worsen or fail to improve.  Keep monitoring your oxygen levels.  Wear your oxygen for oxygen levels of 88% or less.  Use saline nasal gel in your nose to keep your nose from bleeding.

## 2023-07-13 NOTE — Telephone Encounter (Signed)
Pt states he is coughing up green mucus and his sinuses are congested

## 2023-07-13 NOTE — Telephone Encounter (Signed)
See if he can come in this afternoon as an acute visit with chest x-ray PA and lateral prior.

## 2023-07-13 NOTE — Progress Notes (Signed)
Subjective:    Patient ID: Billy Wise, male    DOB: [birth year], 77 y.o.   MRN: [id number]  Patient Care Team: Erasmo Downer, MD as PCP - General (Family Medicine) Delorise Royals, MD as Referring Physician (Endocrinology) Francee Nodal, MD as Referring Physician (Dermatology) Lockie Mola, MD as Referring Physician (Ophthalmology) Gaspar Cola, [facility] (Inactive) as Pharmacist (Pharmacist)  Chief Complaint  Patient presents with   Acute Visit    Symptoms for 3 weeks. Cough with green sputum. Increased SOB. Wheezing.     HPI Billy Wise is a 77 year old former smoker who follows here for the issue of COPD with chronic hypoxic respiratory failure due to the same.  He presents today for ACUTE visit.  He states that over the last 3 weeks he has had increased cough which initially was productive of whitish sputum and then over the last 2 weeks has turned to green.  Symptoms started with nasal congestion, mild epistaxis and purulent nasal discharge.  He has not had any fevers, chills or sweats but has had generalized malaise.  Has noted some increased shortness of breath and occasional wheezing.  Oxygen saturations at home have been ranging between 88 to 92%.  He is on 2 L/min nasal cannula O2 with activity and during sleep.  He has not had any GI complaints.  No anosmia nor dysgeusia.  No hemoptysis.  He has noted increased use of albuterol at several times per week over the last several weeks.  No chest pain, no orthopnea or paroxysmal nocturnal dyspnea.  No palpitations.   DATA: CBC 10/28/17>>eosinophils equals 0. Sleep Study 11/27/17>> no sleep apnea.  Nocturnal desaturations noted. Desat walk 11/15/17>> baseline sat on RA at rest was 91% and HR 89. Walked 360 feet at a very brisk pace, was conversational. Sat was 88% and HR 112.  Moderate dyspnea. CT chest personally reviewed>> CT hi-res  02/01/18; chest x-ray 10/28/17; there is mild basilar fibrosis,  bronchiectatic changes with bronchial thickening, severe apical emphysema. Findings not likely due to UIP.  PFT tracings personally reviewed from 02/01/18>> FVC is 86% predicted, FEV1 is 67% predicted, there is no improvement with bronchodilator.  Ratio 61%. DLCO is reduced at 52%.Flow volume loop is obstructive. Ambulatory oximetry 07/19/2020: Desaturations with exercise, initiated oxygen at 2 L/min 2D echocardiogram 07/26/2020: LVEF 60 to 65%, grade 1 DD, moderate elevation on pulmonary artery systolic pressure. PFTs 07/31/2020: FEV1 1.57 L or 57% predicted, FVC 2.79 L or 74% predicted.  FEV1/FVC 56%.  Postbronchodilator there is a 10% change in FEV1 lung volumes showed mild air trapping no hyperinflation, diffusion capacity moderately reduced.  Consistent with COPD/emphysema.  Flow volume loop is delayed consistent with obstructive lung disease. Alpha 1 antitrypsin 12/15/2022: MM phenotype, level 128 mg/dL PFTs 40/98/1191: FEV1 4.78 L or 51% predicted, FVC 2.55 L or 73% predicted, FEV1/FVC 51%, no significant bronchodilator response.  Lung volumes show air trapping.  Flow volume loop is consistent with obstruction.  Diffusion capacity is severely impaired.  This is consistent with COPD on the basis of emphysema.  As the prior study of 31 July 2020 there has been minimal decline in function.  Review of Systems A 10 point review of systems was performed and it is as noted above otherwise negative.   Patient Active Problem List   Diagnosis Date Noted   Accelerating angina [facility]) 12/29/2022   Coronary artery disease of native artery of native heart with stable angina pectoris (HCC) 11/05/2022   Aortic  atherosclerosis (HCC) 11/05/2022   Senile purpura (HCC) 08/14/2022   Diabetes mellitus with proteinuria (HCC) 08/14/2022   Venous stasis 08/07/2021   Other secondary pulmonary hypertension (HCC) 07/26/2020   Diastolic dysfunction 07/26/2020   Obesity 10/25/2019   History of anemia 04/20/2019    Gastroesophageal reflux disease without esophagitis 09/09/2018   Hyperlipidemia associated with type 2 diabetes mellitus (HCC) 09/09/2018   COPD (chronic obstructive pulmonary disease) (HCC) 10/28/2017   Hypertension associated with diabetes (HCC) 10/28/2017   Insomnia 09/03/2016   BPH with obstruction/lower urinary tract symptoms 12/16/2015    Social History   Tobacco Use   Smoking status: Former    Current packs/day: 0.00    Average packs/day: 1.5 packs/day for 50.0 years (75.0 ttl pk-yrs)    Types: Cigarettes    Start date: 09/29/1958    Quit date: 09/29/2008    Years since quitting: 14.7   Smokeless tobacco: Never  Substance Use Topics   Alcohol use: Yes    Comment: 2 drinks a month / cocktail    No Known Allergies  Current Meds  Medication Sig   Accu-Chek Softclix Lancets lancets Use to check blood sugar twice a day. DX E11.9   amLODipine (NORVASC) 5 MG tablet Take 1 tablet (5 mg total) by mouth daily.   ascorbic acid (VITAMIN C) 1000 MG tablet Take 1 tablet by mouth daily.   aspirin EC 81 MG tablet Take 1 tablet (81 mg total) by mouth daily. Swallow whole.   Blood Glucose Monitoring Suppl (ACCU-CHEK AVIVA PLUS) w/Device KIT Use to check blood sugar twice a day.  DX E11.9   Budeson-Glycopyrrol-Formoterol (BREZTRI AEROSPHERE) 160-9-4.8 MCG/ACT AERO INHALE 2 PUFFS INTO THE LUNGS IN THE MORNING AND AT BEDTIME   dapagliflozin propanediol (FARXIGA) 10 MG TABS tablet Take 1 tablet (10 mg total) by mouth daily.   finasteride (PROSCAR) 5 MG tablet Take 1 tablet (5 mg total) by mouth daily.   glucose blood (ACCU-CHEK GUIDE) test strip Use as instructed   insulin glargine (LANTUS SOLOSTAR) 100 UNIT/ML Solostar Pen Inject 53 Units into the skin at bedtime.   insulin lispro (HUMALOG) 100 UNIT/ML KwikPen Junior breakfast 22 units and supper 12 units directly with food   Insulin Pen Needle (PEN NEEDLES) 31G X 5 MM MISC 1 each by Does not apply route daily as needed.    ipratropium-albuterol (DUONEB) 0.5-2.5 (3) MG/3ML SOLN USE 3 ML VIA NEBULIZER EVERY 6 HOURS (Patient taking differently: Take 3 mLs by nebulization every 6 (six) hours as needed.)   isosorbide mononitrate (IMDUR) 30 MG 24 hr tablet Take 0.5 tablets (15 mg total) by mouth daily.   Lactobacillus (PROBIOTIC ACIDOPHILUS PO) Take 1 capsule by mouth daily.    metFORMIN (GLUCOPHAGE) 1000 MG tablet TAKE 1 TABLET(1000 MG) BY MOUTH TWICE DAILY   nitroGLYCERIN (NITROSTAT) 0.4 MG SL tablet Place 1 tablet (0.4 mg total) under the tongue every 5 (five) minutes as needed for chest pain.   omeprazole (PRILOSEC) 20 MG capsule Take 1 capsule (20 mg total) by mouth daily.   oxybutynin (DITROPAN-XL) 10 MG 24 hr tablet TAKE 1 TABLET(10 MG) BY MOUTH DAILY   PROAIR HFA 108 (90 Base) MCG/ACT inhaler Inhale 2 puffs into the lungs every 4 (four) hours as needed for wheezing.   ramipril (ALTACE) 5 MG capsule Take 2 capsules (10 mg total) by mouth daily AND 1 capsule (5 mg total) at bedtime.   rosuvastatin (CRESTOR) 10 MG tablet Take 1 tablet (10 mg total) by mouth daily.  Semaglutide, 1 MG/DOSE, (OZEMPIC, 1 MG/DOSE,) 4 MG/3ML SOPN Inject into the skin. Once per week   tamsulosin (FLOMAX) 0.4 MG CAPS capsule TAKE 2 CAPSULES(0.8 MG) BY MOUTH DAILY   zolpidem (AMBIEN) 10 MG tablet TAKE 1 TABLET(10 MG) BY MOUTH AT BEDTIME    Immunization History  Administered Date(s) Administered   Fluad Quad(high Dose 65+) 08/05/2020, 08/02/2022, 08/02/2022   Influenza, High Dose Seasonal PF 07/24/2015, 07/21/2016, 08/02/2017, 08/06/2017, 07/27/2018   Influenza,inj,Quad PF,6+ Mos 06/27/2019   Influenza-Unspecified 09/14/2012, 07/19/2021   Moderna SARS-COV2 Booster Vaccination 07/06/2021   PFIZER(Purple Top)SARS-COV-2 Vaccination 11/10/2019, 12/01/2019, 07/14/2020   Pfizer Covid-19 Vaccine Bivalent Booster 63yrs & up 01/26/2021   Pfizer(Comirnaty)Fall Seasonal Vaccine 12 years and older 08/02/2022   Pneumococcal Conjugate-13 08/25/2014    Pneumococcal Polysaccharide-23 01/26/2016   Pneumococcal-Unspecified 12/17/2005   Rsv, Bivalent, Protein Subunit Rsvpref,pf Verdis Frederickson) 09/05/2022   Tdap 12/17/2005, 02/25/2016   Zoster Recombinant(Shingrix) 08/23/2021, 06/05/2022   Zoster, Live 11/24/2013        Objective:     BP 110/62 (BP Location: Right Arm, Cuff Size: Normal)   Pulse (!) 42   Temp (!) 97.4 F (36.3 C)   Ht 5\' 5"  (1.651 m)   Wt 185 lb 6.4 oz (84.1 kg)   SpO2 92%   BMI 30.85 kg/m   SpO2: 92 % O2 Device: None (Room air)  GENERAL: Awake, alert, no respiratory distress.  Appears acutely ill but nontoxic, fully ambulatory. HEAD: Normocephalic, atraumatic.  EYES: Pupils equal, round, reactive to light.  No scleral icterus.  MOUTH: Oral mucosa moist.  Dentition intact. NECK: Supple. No thyromegaly. Trachea midline. No JVD.  No adenopathy. PULMONARY: Good air entry bilaterally.  Coarse, dry crackles at bases (chronic), no other adventitious sounds. CARDIOVASCULAR: S1 and S2.  Regular rate with frequent extrasystoles (confirmed by EKG). ABDOMEN: Protuberant, truncal obesity, otherwise benign. MUSCULOSKELETAL: No joint deformity, no clubbing, trace edema.  NEUROLOGIC: No deficits, no gait disturbance, speech is fluent. SKIN: Intact,warm,dry.  Bilateral stasis changes lower extremities. PSYCH: Mood and behavior normal.   Chest x-ray performed today, independently reviewed, no significant infiltrate noted:     EKG/Rhythm strip performed today: Sinus rhythm with frequent premature ventricular complexes.  Heart rate 84, LAD, inferior infarct age undetermined.  When compared to prior PVCs is more frequent but otherwise, unchanged.  Assessment & Plan:     ICD-10-CM   1. COPD with acute exacerbation (HCC)  J44.1    No wheezing noted Monitor O2 sats, continue oxygen supplementation Avoid steroids due to diabetes Chest x-ray shows no infiltrate    2. Acute bacterial sinusitis  J01.90    B96.89    Likely  trigger of exacerbation Augmentin 875 mg twice daily x 7 days Nasal hygiene, nasal saline Nasal saline gel    3. COPD, moderate (HCC) Moderate/Severe  J44.9    Continue Breztri 2 puffs twice a day Continue as needed albuterol    4. Chronic respiratory failure with hypoxia (HCC)  J96.11    Continue oxygen at 2 L/min Monitor oxygen saturations at home Keep at 88% or better To ED if persistently 88% or below    5. Irregular cardiac rhythm  I49.9 EKG 12-Lead    Rhythm ECG, report   EKG notes sinus rhythm with frequent PVCs No A-fib/flutter     Orders Placed This Encounter  Procedures   EKG 12-Lead   Rhythm ECG, report   Meds ordered this encounter  Medications   amoxicillin-clavulanate (AUGMENTIN) 875-125 MG tablet    Sig: Take  1 tablet by mouth 2 (two) times daily for 7 days.    Dispense:  14 tablet    Refill:  0   albuterol (VENTOLIN HFA) 108 (90 Base) MCG/ACT inhaler    Sig: Inhale 2 puffs into the lungs every 6 (six) hours as needed for wheezing or shortness of breath.    Dispense:  18 each    Refill:  2   Instructed to follow oxygen saturations carefully.  Currently does not exhibit bronchospasm avoiding prednisone due to patient's issues with diabetes.  Will treat acute bacterial sinusitis with Augmentin.  Continue bronchodilators as needed.  Patient has follow-up appointment on 31 October at 8:30 AM, patient was instructed to keep that appointment however he is to contact us prior to that time should his symptoms worsen, fail to improve or new issues arise.  Total visit time 55 minutes.   Gailen Shelter, MD Advanced Bronchoscopy PCCM [facility]    *This note was dictated using voice recognition software/Dragon.  Despite best efforts to proofread, errors can occur which can change the meaning. Any transcriptional errors that result from this process are unintentional and may not be fully corrected at the time of dictation.

## 2023-07-13 NOTE — Telephone Encounter (Signed)
Patient is aware of below message/recommendations and voiced his understanding.  Appt scheduled for today at 1:30. CXR ordered. Nothing further needed.

## 2023-07-13 NOTE — Telephone Encounter (Signed)
Called and spoke to patient.  He reports of prod cough with green sputum, dry sinuses and increased SOB with exertion x2-3wk Denied f/c/s or additional sx.  Had not tested for covid.  He wears 2L at bedtime and PRN.  This morning he has had to wear oxygen due to spo2 dropping as low as 86% on roomair. He is using albuterol solution once daily and Breztri BID.   Dr. Jayme Cloud, please  advise. Thanks

## 2023-07-14 ENCOUNTER — Encounter: Payer: Self-pay | Admitting: Pulmonary Disease

## 2023-07-15 MED ORDER — HYDROXYZINE HCL 25 MG PO TABS: 25.0000 mg | ORAL_TABLET | Freq: Three times a day (TID) | ORAL | 0 refills | Status: DC | PRN

## 2023-07-15 MED ORDER — ALBUTEROL SULFATE (2.5 MG/3ML) 0.083% IN NEBU: 2.5000 mg | INHALATION_SOLUTION | Freq: Four times a day (QID) | RESPIRATORY_TRACT | 6 refills | Status: DC | PRN

## 2023-07-15 NOTE — Telephone Encounter (Signed)
If he cannot get off of the oxygen then he needs to be seen in the emergency room because more than likely he will need to be admitted for his exacerbation.

## 2023-07-15 NOTE — Telephone Encounter (Signed)
Dr. Reece Agar- PT calling now as well and states he is also out of Neb medication: Ipratropium/Albuterol.   Please use [facility] @ [address] Thank you .

## 2023-07-15 NOTE — Telephone Encounter (Signed)
Trixie Deis "[name]"  P [facility] (supporting Salena Saner, MD)10 minutes ago (11:57 AM)   Thanks I may have to come back in ever since I left your office. I can't get off of continuous oxygen are my saturation drops below 90 into the mid 80s it sort of scares me   Dr. Jayme Cloud, please advise. thanks

## 2023-07-15 NOTE — Telephone Encounter (Signed)
Spoke to patient via telephone and relayed below message/recommendations.  He will present to ED.  He would [facility]ol [facility]. Rx sent.

## 2023-07-15 NOTE — Telephone Encounter (Signed)
Since he is on Breztri we can switch the nebulizer solution to just albuterol nebulizer solution to use 4 times a day as needed.  For anxiety we can send in some hydroxyzine 25 mg 3 times daily to 4 times daily as needed anxiety.  If this is not working then he needs to discuss with his primary physician.

## 2023-07-21 ENCOUNTER — Other Ambulatory Visit: Payer: Self-pay | Admitting: Family Medicine

## 2023-07-22 ENCOUNTER — Telehealth: Payer: Self-pay | Admitting: Pulmonary Disease

## 2023-07-22 MED ORDER — PREDNISONE 20 MG PO TABS: 20.0000 mg | ORAL_TABLET | Freq: Every day | ORAL | 0 refills | Status: DC

## 2023-07-22 NOTE — Telephone Encounter (Signed)
Patient would like a call back as well once prescription is filled.

## 2023-07-22 NOTE — Telephone Encounter (Signed)
Requested Prescriptions  Pending Prescriptions Disp Refills   finasteride (PROSCAR) 5 MG tablet [Pharmacy Med Name: FINASTERIDE 5MG  TABLETS] 90 tablet 0    Sig: TAKE 1 TABLET(5 MG) BY MOUTH DAILY     Urology: 5-alpha Reductase Inhibitors Passed - 07/21/2023 12:20 PM      Passed - PSA in normal range and within 360 days    PSA  Date Value Ref Range Status  04/06/2018 0.6  Final   Prostate Specific Ag, Serum  Date Value Ref Range Status  08/14/2022 0.3 0.0 - 4.0 ng/mL Final    Comment:    Roche ECLIA methodology. According to the American Urological Association, Serum PSA should decrease and remain at undetectable levels after radical prostatectomy. The AUA defines biochemical recurrence as an initial PSA value 0.2 ng/mL or greater followed by a subsequent confirmatory PSA value 0.2 ng/mL or greater. Values obtained with different assay methods or kits cannot be used interchangeably. Results cannot be interpreted as absolute evidence of the presence or absence of malignant disease.          Passed - Valid encounter within last 12 months    Recent Outpatient Visits           5 months ago Diabetes mellitus with proteinuria [facility])   [place] [facility] [place], Marzella Schlein, MD   8 months ago Coronary artery disease of native artery of native heart with stable angina pectoris [facility])   [place] [facility] [place], Marzella Schlein, MD   11 months ago Annual physical exam   [facility] Jacky Kindle, FNP   1 year ago Encounter for annual wellness visit (AWV) in Medicare patient   [place] [facility] [place], Marzella Schlein, MD   2 years ago Orthostatic hypotension   [place] [facility] [place], Marzella Schlein, MD               metFORMIN (GLUCOPHAGE) 1000 MG tablet [Pharmacy Med Name: METFORMIN 1000MG  TABLETS] 180 tablet 0    Sig: TAKE 1 TABLET(1000 MG) BY MOUTH TWICE DAILY      Endocrinology:  Diabetes - Biguanides Failed - 07/21/2023 12:20 PM      Failed - B12 Level in normal range and within 720 days    Vitamin B-12  Date Value Ref Range Status  09/19/2018 1,933  Final         Passed - Cr in normal range and within 360 days    Creatinine  Date Value Ref Range Status  03/29/2014 2.20 (H) 0.60 - 1.30 mg/dL Final   Creatinine, Ser  Date Value Ref Range Status  12/22/2022 1.04 0.61 - 1.24 mg/dL Final   Creatinine, Urine  Date Value Ref Range Status  10/22/2022 88.4  Final         Passed - HBA1C is between 0 and 7.9 and within 180 days    Hemoglobin A1C  Date Value Ref Range Status  02/16/2023 6.0 (A) 4.0 - 5.6 % Final  10/22/2022 6.3  Final         Passed - eGFR in normal range and within 360 days    EGFR (African American)  Date Value Ref Range Status  03/29/2014 35 (L)  Final   GFR calc Af Amer  Date Value Ref Range Status  08/05/2020 93 >59 mL/min/1.73 Final    Comment:    **In accordance with recommendations from the NKF-ASN Task force,**   [facility] is in the process  of updating its eGFR calculation to the   2021 CKD-EPI creatinine equation that estimates kidney function   without a race variable.    EGFR (Non-African Amer.)  Date Value Ref Range Status  03/29/2014 30 (L)  Final    Comment:    eGFR values <37mL/min/1.73 m2 may be an indication of chronic kidney disease (CKD). Calculated eGFR is useful in patients with stable renal function. The eGFR calculation will not be reliable in acutely ill patients when serum creatinine is changing rapidly. It is not useful in  patients on dialysis. The eGFR calculation may not be applicable to patients at the low and high extremes of body sizes, pregnant women, and vegetarians.    GFR, Estimated  Date Value Ref Range Status  12/22/2022 >60 >60 mL/min Final    Comment:    (NOTE) Calculated using the CKD-EPI Creatinine Equation (2021)    eGFR  Date Value Ref Range Status  10/22/2022  63  Final  08/14/2022 89 >59 mL/min/1.73 Final         Passed - Valid encounter within last 6 months    Recent Outpatient Visits           5 months ago Diabetes mellitus with proteinuria (HCC)   [place] [facility] [place], Marzella Schlein, MD   8 months ago Coronary artery disease of native artery of native heart with stable angina pectoris [facility])   [place] [facility] [place], Marzella Schlein, MD   11 months ago Annual physical exam   [facility] Merita Norton T, FNP   1 year ago Encounter for annual wellness visit (AWV) in Medicare patient   [place] [facility] [place], Marzella Schlein, MD   2 years ago Orthostatic hypotension   [place] [facility] [place], Marzella Schlein, MD              Passed - CBC within normal limits and completed in the last 12 months    WBC  Date Value Ref Range Status  12/22/2022 7.1 4.0 - 10.5 K/uL Final   RBC  Date Value Ref Range Status  12/22/2022 5.45 4.22 - 5.81 MIL/uL Final   Hemoglobin  Date Value Ref Range Status  12/29/2022 13.9 13.0 - 17.0 g/dL Final  29/56/2130 86.5 13.0 - 17.7 g/dL Final   HCT  Date Value Ref Range Status  12/29/2022 41.0 39.0 - 52.0 % Final   Hematocrit  Date Value Ref Range Status  08/14/2022 43.2 37.5 - 51.0 % Final   MCHC  Date Value Ref Range Status  12/22/2022 32.4 30.0 - 36.0 g/dL Final   [facility]  Date Value Ref Range Status  12/22/2022 26.8 26.0 - 34.0 pg Final   MCV  Date Value Ref Range Status  12/22/2022 82.6 80.0 - 100.0 fL Final  08/14/2022 82 79 - 97 fL Final  03/29/2014 84 80 - 100 fL Final   No results found for: "PLTCOUNTKUC", "LABPLAT", "POCPLA" RDW  Date Value Ref Range Status  12/22/2022 14.9 11.5 - 15.5 % Final  08/14/2022 14.3 11.6 - 15.4 % Final  03/29/2014 14.2 11.5 - 14.5 % Final

## 2023-07-22 NOTE — Telephone Encounter (Signed)
I have sent in the prescription and notified the patient.  Nothing further needed. 

## 2023-07-22 NOTE — Telephone Encounter (Signed)
Patient is calling back fro another round of antibiotics. They prescription from this morning has not been filled yet.

## 2023-07-22 NOTE — Telephone Encounter (Signed)
Patient prescribed abx during 07/13/2023 appt.  Spoke to patient. He stated that he completed course of abx on Tuesday, sx improved but are still present.  C/o prod cough with white to light brown sputum, some chest tightness and occ wheezing  SOB is back to baseline.  Denied f/c/s or additional sx.  He would like another round of abx.  He wears 2L at bedtime and PRN. Spo2 maintaining around 92% on roomair. He is using duoneb TID and breztri BID.   Dr. Jayme Cloud, please advise. Thanks

## 2023-07-22 NOTE — Telephone Encounter (Signed)
Pt calling in for another round of antibiotics  Pharmacy: [id number] - [place], [place] - [address]

## 2023-07-22 NOTE — Telephone Encounter (Signed)
I do not think that he needs more antibiotic.  Lets give him prednisone 20 mg 1 tablet daily for 5 days.  I think what he has left now is mostly inflammation.  He needs to monitor his glucoses closely while on the prednisone.

## 2023-07-26 ENCOUNTER — Ambulatory Visit: Payer: Self-pay

## 2023-07-26 NOTE — Telephone Encounter (Signed)
    Chief Complaint: Anxiety, numbness to lips that comes and goes. Pulmonary ordered hydroxyzine for him. Request to be seen sooner than appointment Thursday , will see Dr. B only. Symptoms: Above Frequency: 2 weeks Pertinent Negatives: Patient denies thoughts of self harm Disposition: [] ED /[] Urgent Care (no appt availability in office) / [] Appointment(In office/virtual)/ []  [place] [facility]/ [] Home Care/ [] Refused Recommended Disposition /[] [place] Mobile [facility]/ [x]  Follow-up with PCP Additional Notes: Please advise pt.  Reason for Disposition  MODERATE anxiety (e.g., persistent or frequent anxiety symptoms; interferes with sleep, school, or work)  Answer Assessment - Initial Assessment Questions 1. CONCERN: "Did anything happen that prompted you to call today?"      Pulmonary ordered Hydroxyzine 2. ANXIETY SYMPTOMS: "Can you describe how you (your loved one; patient) have been feeling?" (e.g., tense, restless, panicky, anxious, keyed up, overwhelmed, sense of impending doom).      Yes - anxiety 3. ONSET: "How long have you been feeling this way?" (e.g., hours, days, weeks)     Last week 4. SEVERITY: "How would you rate the level of anxiety?" (e.g., 0 - 10; or mild, moderate, severe).     Mils to moderate 5. FUNCTIONAL IMPAIRMENT: "How have these feelings affected your ability to do daily activities?" "Have you had more difficulty than usual doing your normal daily activities?" (e.g., getting better, same, worse; self-care, school, work, interactions)     No 6. HISTORY: "Have you felt this way before?" "Have you ever been diagnosed with an anxiety problem in the past?" (e.g., generalized anxiety disorder, panic attacks, PTSD). If Yes, ask: "How was this problem treated?" (e.g., medicines, counseling, etc.)     No 7. RISK OF HARM - SUICIDAL IDEATION: "Do you ever have thoughts of hurting or killing yourself?" If Yes, ask:  "Do you have these feelings now?" "Do you have a plan on  how you would do this?"     No 8. TREATMENT:  "What has been done so far to treat this anxiety?" (e.g., medicines, relaxation strategies). "What has helped?"     Medicine 9. TREATMENT - THERAPIST: "Do you have a counselor or therapist? Name?"     No 10. POTENTIAL TRIGGERS: "Do you drink caffeinated beverages (e.g., coffee, colas, teas), and how much daily?" "Do you drink alcohol or use any drugs?" "Have you started any new medicines recently?"       No  11. PATIENT SUPPORT: "Who is with you now?" "Who do you live with?" "Do you have family or friends who you can talk to?"        Family 12. OTHER SYMPTOMS: "Do you have any other symptoms?" (e.g., feeling depressed, trouble concentrating, trouble sleeping, trouble breathing, palpitations or fast heartbeat, chest pain, sweating, nausea, or diarrhea)       No 13. PREGNANCY: "Is there any chance you are pregnant?" "When was your last menstrual period?"       N/a  Protocols used: Anxiety and Panic Attack-A-AH

## 2023-07-27 NOTE — Telephone Encounter (Signed)
I don't have anything sooner than Thursday, but if anything opens today he can have it.

## 2023-07-29 ENCOUNTER — Ambulatory Visit: Payer: Medicare PPO | Admitting: Family Medicine

## 2023-07-30 ENCOUNTER — Ambulatory Visit: Payer: Medicare PPO | Admitting: Physician Assistant

## 2023-07-30 VITALS — BP 127/84 | HR 91 | Temp 97.8°F | Resp 16 | Ht 64.0 in | Wt 181.5 lb

## 2023-07-30 DIAGNOSIS — R682 Dry mouth, unspecified: Secondary | ICD-10-CM | POA: Diagnosis not present

## 2023-07-30 DIAGNOSIS — G4700 Insomnia, unspecified: Secondary | ICD-10-CM | POA: Diagnosis not present

## 2023-07-30 DIAGNOSIS — F419 Anxiety disorder, unspecified: Secondary | ICD-10-CM

## 2023-07-30 MED ORDER — HYDROXYZINE HCL 25 MG PO TABS
25.0000 mg | ORAL_TABLET | Freq: Three times a day (TID) | ORAL | 0 refills | Status: DC | PRN
Start: 2023-07-30 — End: 2023-08-16

## 2023-07-30 MED ORDER — BUSPIRONE HCL 5 MG PO TABS
5.0000 mg | ORAL_TABLET | Freq: Two times a day (BID) | ORAL | 0 refills | Status: DC
Start: 2023-07-30 — End: 2023-08-16

## 2023-07-30 NOTE — Progress Notes (Signed)
Established patient visit  Patient: Billy Wise   DOB: November 14, 1945   77 y.o. Male  MRN: [id number] Visit Date: 07/30/2023  Today's healthcare provider: Debera Lat, PA-C   Chief Complaint  Patient presents with   Anxiety    Super nervous, recently taking hydroxyzine HCL 25mg  tabs feels like he is crawling out of his skin smallest thing sets the patient off   Subjective      Discussed the use of AI scribe software for clinical note transcription with the patient, who gave verbal consent to proceed.  History of Present Illness   The patient, with a history of COPD, presents with severe anxiety symptoms that have started in the last two weeks following a sickness. The patient describes feeling very nervous, anxious, restless, and irritable every day. The patient also reports trouble relaxing and worrying too much about different things. The patient has been taking hydroxyzine 25 mcg for anxiety but is unsure if it's effective. The patient also reports insomnia and has been taking Ambien for 25 years. The patient is also experiencing dry mouth and numbness in the tongue and lips. 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           07/30/2023    1:52 PM 02/16/2023    8:08 AM 11/05/2022    9:30 AM  Depression screen PHQ 2/9  Decreased Interest 0 0 0  Down, Depressed, Hopeless 0 0 0  PHQ - 2 Score 0 0 0  Altered sleeping 3 3 0  Tired, decreased energy 0 0 3  Change in appetite 0 0 0  Feeling bad or failure about yourself  0 0 0  Trouble concentrating 3 0 0  Moving slowly or fidgety/restless 0 0 0  Suicidal thoughts 0 0 0  PHQ-9 Score 6 3 3   Difficult doing work/chores Not difficult at all Not difficult at all Not difficult at all      07/30/2023    1:58 PM  GAD 7 : Generalized Anxiety Score  Nervous, Anxious, on Edge 3  Control/stop worrying 1  Worry too much - different things 3  Trouble relaxing 3  Restless 3  Easily annoyed  or irritable 3  Afraid - awful might happen 3  Total GAD 7 Score 19       Medications: Outpatient Medications Prior to Visit  Medication Sig   Accu-Chek Softclix Lancets lancets Use to check blood sugar twice a day. DX E11.9   albuterol (PROVENTIL) (2.5 MG/3ML) 0.083% nebulizer solution Take 3 mLs (2.5 mg total) by nebulization every 6 (six) hours as needed for wheezing or shortness of breath.   albuterol (VENTOLIN HFA) 108 (90 Base) MCG/ACT inhaler Inhale 2 puffs into the lungs every 6 (six) hours as needed for wheezing or shortness of breath.   amLODipine (NORVASC) 5 MG tablet Take 1 tablet (5 mg total) by mouth daily.   ascorbic acid (VITAMIN C) 1000 MG tablet Take 1 tablet by mouth daily.   aspirin EC 81 MG tablet Take 1 tablet (81 mg total) by mouth daily. Swallow whole.   Blood Glucose Monitoring Suppl (ACCU-CHEK AVIVA PLUS) w/Device KIT Use to check blood sugar twice a day.  DX E11.9   Budeson-Glycopyrrol-Formoterol (BREZTRI AEROSPHERE) 160-9-4.8 MCG/ACT AERO INHALE 2 PUFFS INTO THE LUNGS IN THE MORNING AND AT BEDTIME   dapagliflozin propanediol (FARXIGA) 10 MG TABS tablet Take 1 tablet (10 mg total) by mouth daily.   finasteride (PROSCAR) 5  MG tablet TAKE 1 TABLET(5 MG) BY MOUTH DAILY   glucose blood (ACCU-CHEK GUIDE) test strip Use as instructed   insulin glargine (LANTUS SOLOSTAR) 100 UNIT/ML Solostar Pen Inject 53 Units into the skin at bedtime.   insulin lispro (HUMALOG) 100 UNIT/ML KwikPen Junior breakfast 22 units and supper 12 units directly with food   Insulin Pen Needle (PEN NEEDLES) 31G X 5 MM MISC 1 each by Does not apply route daily as needed.   isosorbide mononitrate (IMDUR) 30 MG 24 hr tablet Take 0.5 tablets (15 mg total) by mouth daily.   Lactobacillus (PROBIOTIC ACIDOPHILUS PO) Take 1 capsule by mouth daily.    metFORMIN (GLUCOPHAGE) 1000 MG tablet TAKE 1 TABLET(1000 MG) BY MOUTH TWICE DAILY   nitroGLYCERIN (NITROSTAT) 0.4 MG SL tablet Place 1 tablet (0.4 mg  total) under the tongue every 5 (five) minutes as needed for chest pain.   omeprazole (PRILOSEC) 20 MG capsule Take 1 capsule (20 mg total) by mouth daily.   oxybutynin (DITROPAN-XL) 10 MG 24 hr tablet TAKE 1 TABLET(10 MG) BY MOUTH DAILY   OZEMPIC, 0.25 OR 0.5 MG/DOSE, 2 MG/3ML SOPN Inject 0.5 mg into the skin once a week.   predniSONE (DELTASONE) 20 MG tablet Take 1 tablet (20 mg total) by mouth daily.   ramipril (ALTACE) 5 MG capsule Take 2 capsules (10 mg total) by mouth daily AND 1 capsule (5 mg total) at bedtime.   rosuvastatin (CRESTOR) 10 MG tablet Take 1 tablet (10 mg total) by mouth daily.   Semaglutide, 1 MG/DOSE, (OZEMPIC, 1 MG/DOSE,) 4 MG/3ML SOPN Inject into the skin. Once per week   tamsulosin (FLOMAX) 0.4 MG CAPS capsule TAKE 2 CAPSULES(0.8 MG) BY MOUTH DAILY   zolpidem (AMBIEN) 10 MG tablet TAKE 1 TABLET(10 MG) BY MOUTH AT BEDTIME   [DISCONTINUED] hydrOXYzine (ATARAX) 25 MG tablet Take 1 tablet (25 mg total) by mouth 3 (three) times daily as needed.   ipratropium-albuterol (DUONEB) 0.5-2.5 (3) MG/3ML SOLN USE 3 ML VIA NEBULIZER EVERY 6 HOURS (Patient not taking: Reported on 07/30/2023)   Facility-Administered Medications Prior to Visit  Medication Dose Route Frequency Provider   sodium chloride flush (NS) 0.9 % injection 3 mL  3 mL Intravenous Q12H Gollan, Tollie Pizza, MD    Review of Systems  All other systems reviewed and are negative.  Except see HPI       Objective    BP 127/84 (BP Location: Left Arm, Patient Position: Sitting, Cuff Size: Normal)   Pulse 91   Temp 97.8 F (36.6 C)   Resp 16   Ht 5\' 4"  (1.626 m)   Wt 181 lb 8 oz (82.3 kg)   SpO2 91%   BMI 31.15 kg/m     Physical Exam Constitutional:      General: He is not in acute distress.    Appearance: Normal appearance. He is not diaphoretic.  HENT:     Head: Normocephalic.  Eyes:     Conjunctiva/sclera: Conjunctivae normal.  Pulmonary:     Effort: Pulmonary effort is normal. No respiratory  distress.  Neurological:     Mental Status: He is alert and oriented to person, place, and time. Mental status is at baseline.      No results found for any visits on 07/30/23.  Assessment & Plan     Severe Anxiety Daily symptoms of nervousness, restlessness, irritability, and fear of something awful happening. Patient has never had therapy or taken antidepressants before. - Increase atarax frequency to twice daily,  with the option to increase to three times daily if needed. Add Buspar 5 mg twice daily if needed -Consider starting an antidepressant if anxiety does not improve. -Refer to behavioral health for therapy. -Encourage deep breathing exercises, meditation, and tapping for anxiety relief.  Insomnia Longstanding difficulty sleeping, currently managed with Ambien. Concerns about cognitive effects of long-term Ambien use. -Continue Ambien as needed, but consider alternative sleep aids due to potential cognitive effects.  Dry Mouth/xerostomia Daily symptoms, possibly related to medication's anticholinergic side effects. -Encourage to drink plenty of water -Consider adjusting medication regimen if dryness worsens and add buspar instead of increasing the dose of atarax    No follow-ups on file.     The patient was advised to call back or seek an in-person evaluation if the symptoms worsen or if the condition fails to improve as anticipated.  I discussed the assessment and treatment plan with the patient. The patient was provided an opportunity to ask questions and all were answered. The patient agreed with the plan and demonstrated an understanding of the instructions.  I, Debera Lat, PA-C have reviewed all documentation for this visit. The documentation on  07/30/23  for the exam, diagnosis, procedures, and orders are all accurate and complete.  Debera Lat, [facility], MMS [facility] [phone] (phone) [phone] (fax)  [facility]

## 2023-07-31 ENCOUNTER — Encounter: Payer: Self-pay | Admitting: Physician Assistant

## 2023-07-31 DIAGNOSIS — F419 Anxiety disorder, unspecified: Secondary | ICD-10-CM | POA: Insufficient documentation

## 2023-08-11 DIAGNOSIS — R682 Dry mouth, unspecified: Secondary | ICD-10-CM | POA: Diagnosis not present

## 2023-08-11 DIAGNOSIS — J340 Abscess, furuncle and carbuncle of nose: Secondary | ICD-10-CM | POA: Diagnosis not present

## 2023-08-16 ENCOUNTER — Ambulatory Visit: Payer: Medicare PPO | Admitting: Family Medicine

## 2023-08-16 ENCOUNTER — Encounter: Payer: Self-pay | Admitting: Family Medicine

## 2023-08-16 VITALS — BP 124/77 | HR 99 | Ht 64.0 in | Wt 178.6 lb

## 2023-08-16 DIAGNOSIS — I152 Hypertension secondary to endocrine disorders: Secondary | ICD-10-CM

## 2023-08-16 DIAGNOSIS — Z7984 Long term (current) use of oral hypoglycemic drugs: Secondary | ICD-10-CM

## 2023-08-16 DIAGNOSIS — E785 Hyperlipidemia, unspecified: Secondary | ICD-10-CM

## 2023-08-16 DIAGNOSIS — Z0000 Encounter for general adult medical examination without abnormal findings: Secondary | ICD-10-CM

## 2023-08-16 DIAGNOSIS — F419 Anxiety disorder, unspecified: Secondary | ICD-10-CM

## 2023-08-16 DIAGNOSIS — G4700 Insomnia, unspecified: Secondary | ICD-10-CM | POA: Diagnosis not present

## 2023-08-16 DIAGNOSIS — J42 Unspecified chronic bronchitis: Secondary | ICD-10-CM

## 2023-08-16 DIAGNOSIS — E1129 Type 2 diabetes mellitus with other diabetic kidney complication: Secondary | ICD-10-CM | POA: Diagnosis not present

## 2023-08-16 DIAGNOSIS — E1169 Type 2 diabetes mellitus with other specified complication: Secondary | ICD-10-CM | POA: Diagnosis not present

## 2023-08-16 DIAGNOSIS — E1159 Type 2 diabetes mellitus with other circulatory complications: Secondary | ICD-10-CM | POA: Diagnosis not present

## 2023-08-16 DIAGNOSIS — R809 Proteinuria, unspecified: Secondary | ICD-10-CM

## 2023-08-16 DIAGNOSIS — D692 Other nonthrombocytopenic purpura: Secondary | ICD-10-CM | POA: Diagnosis not present

## 2023-08-16 DIAGNOSIS — Z794 Long term (current) use of insulin: Secondary | ICD-10-CM

## 2023-08-16 MED ORDER — LANTUS SOLOSTAR 100 UNIT/ML ~~LOC~~ SOPN
50.0000 [IU] | PEN_INJECTOR | Freq: Every day | SUBCUTANEOUS | Status: DC
Start: 2023-08-16 — End: 2024-02-15

## 2023-08-16 NOTE — Assessment & Plan Note (Signed)
Chronic Obstructive Pulmonary Disease (COPD) Patient reports needing oxygen 24 hours a day during a recent exacerbation. Current oxygen saturation is 89-92%. -Continue current COPD management. -Consider sleep study to evaluate for sleep apnea.

## 2023-08-16 NOTE — Assessment & Plan Note (Signed)
Insomnia Chronic insomnia despite taking Ambien. Patient reports waking up every two hours and needing to use the bathroom. -Consider sleep study to evaluate for sleep apnea. -Continue Ambien as prescribed.

## 2023-08-16 NOTE — Assessment & Plan Note (Signed)
stable °

## 2023-08-16 NOTE — Assessment & Plan Note (Signed)
Hypoglycemia Episodes of anxiety, irritability, and nervousness likely secondary to hypoglycemia. Patient has been on Ozempic 1mg  weekly, Farxiga 10mg  daily, Lantus 53 units at bedtime, Humalog 22 units with breakfast and 12 units with supper, and Metformin 1000mg  twice daily. Recent weight loss of 6-7 pounds since starting Ozempic 1mg . -Reduce Lantus to 50 units at bedtime. -Check blood sugar when experiencing symptoms of anxiety. -Message provider with details of blood sugar readings if symptoms persist or blood sugars are consistently low. - f/b Endocrinology - next visit in 2/25

## 2023-08-16 NOTE — Progress Notes (Signed)
Annual Wellness Visit     Patient: Billy Wise, Male    DOB: [birth year], 77 y.o.   MRN: [id number]  Subjective  Chief Complaint  Patient presents with   Annual Exam    Diet - General well balanced Exercise - not in the last couple of months Feeling - Fairly well Sleeping - Poorly Concerns - Patient reports he has several things he would like to discuss    Billy Wise is a 77 y.o. male who presents today for his Annual Wellness Visit.   Discussed the use of AI scribe software for clinical note transcription with the patient, who gave verbal consent to proceed.  History of Present Illness   The patient, with a history of irregular heartbeat, anxiety, and blood sugar fluctuations, presents for a wellness visit. The patient reports feeling anxious and irritable, with symptoms often occurring in the evening. The patient also experiences sleeplessness, waking up every two hours at night, despite taking Ambien. The patient also reports frequent urination at night. The patient has been experiencing dry mouth and dry sinuses, which have been bothersome. The patient has also noticed a weight loss of nine pounds since starting Ozempic, which was not intentional. The patient's blood sugar levels have been fluctuating, with a recent episode of hypoglycemia (blood sugar level of 60). The patient is currently on multiple medications for various conditions, including diabetes, hypertension, and benign prostatic hyperplasia.              Medications: Outpatient Medications Prior to Visit  Medication Sig   Accu-Chek Softclix Lancets lancets Use to check blood sugar twice a day. DX E11.9   albuterol (PROVENTIL) (2.5 MG/3ML) 0.083% nebulizer solution Take 3 mLs (2.5 mg total) by nebulization every 6 (six) hours as needed for wheezing or shortness of breath.   albuterol (VENTOLIN HFA) 108 (90 Base) MCG/ACT inhaler Inhale 2 puffs into the lungs every 6 (six) hours as needed for wheezing  or shortness of breath.   amLODipine (NORVASC) 5 MG tablet Take 1 tablet (5 mg total) by mouth daily.   ascorbic acid (VITAMIN C) 1000 MG tablet Take 1 tablet by mouth daily.   aspirin EC 81 MG tablet Take 1 tablet (81 mg total) by mouth daily. Swallow whole.   Blood Glucose Monitoring Suppl (ACCU-CHEK AVIVA PLUS) w/Device KIT Use to check blood sugar twice a day.  DX E11.9   Budeson-Glycopyrrol-Formoterol (BREZTRI AEROSPHERE) 160-9-4.8 MCG/ACT AERO INHALE 2 PUFFS INTO THE LUNGS IN THE MORNING AND AT BEDTIME   dapagliflozin propanediol (FARXIGA) 10 MG TABS tablet Take 1 tablet (10 mg total) by mouth daily.   finasteride (PROSCAR) 5 MG tablet TAKE 1 TABLET(5 MG) BY MOUTH DAILY   glucose blood (ACCU-CHEK GUIDE) test strip Use as instructed   insulin lispro (HUMALOG) 100 UNIT/ML KwikPen Junior breakfast 22 units and supper 12 units directly with food   Insulin Pen Needle (PEN NEEDLES) 31G X 5 MM MISC 1 each by Does not apply route daily as needed.   isosorbide mononitrate (IMDUR) 30 MG 24 hr tablet Take 0.5 tablets (15 mg total) by mouth daily.   Lactobacillus (PROBIOTIC ACIDOPHILUS PO) Take 1 capsule by mouth daily.    metFORMIN (GLUCOPHAGE) 1000 MG tablet TAKE 1 TABLET(1000 MG) BY MOUTH TWICE DAILY   nitroGLYCERIN (NITROSTAT) 0.4 MG SL tablet Place 1 tablet (0.4 mg total) under the tongue every 5 (five) minutes as needed for chest pain.   omeprazole (PRILOSEC) 20 MG capsule Take 1 capsule (20  mg total) by mouth daily.   oxybutynin (DITROPAN-XL) 10 MG 24 hr tablet TAKE 1 TABLET(10 MG) BY MOUTH DAILY   ramipril (ALTACE) 5 MG capsule Take 2 capsules (10 mg total) by mouth daily AND 1 capsule (5 mg total) at bedtime.   rosuvastatin (CRESTOR) 10 MG tablet Take 1 tablet (10 mg total) by mouth daily.   Semaglutide, 1 MG/DOSE, (OZEMPIC, 1 MG/DOSE,) 4 MG/3ML SOPN Inject into the skin. Once per week   tamsulosin (FLOMAX) 0.4 MG CAPS capsule TAKE 2 CAPSULES(0.8 MG) BY MOUTH DAILY   zolpidem (AMBIEN) 10 MG  tablet TAKE 1 TABLET(10 MG) BY MOUTH AT BEDTIME   [DISCONTINUED] busPIRone (BUSPAR) 5 MG tablet Take 1 tablet (5 mg total) by mouth 2 (two) times daily.   [DISCONTINUED] hydrOXYzine (ATARAX) 25 MG tablet Take 1 tablet (25 mg total) by mouth 3 (three) times daily as needed.   [DISCONTINUED] insulin glargine (LANTUS SOLOSTAR) 100 UNIT/ML Solostar Pen Inject 53 Units into the skin at bedtime.   [DISCONTINUED] OZEMPIC, 0.25 OR 0.5 MG/DOSE, 2 MG/3ML SOPN Inject 0.5 mg into the skin once a week.   [DISCONTINUED] predniSONE (DELTASONE) 20 MG tablet Take 1 tablet (20 mg total) by mouth daily.   Facility-Administered Medications Prior to Visit  Medication Dose Route Frequency Provider   sodium chloride flush (NS) 0.9 % injection 3 mL  3 mL Intravenous Q12H Gollan, Tollie Pizza, MD    No Known Allergies  Patient Care Team: Erasmo Downer, MD as PCP - General (Family Medicine) Delorise Royals, MD as Referring Physician (Endocrinology) Francee Nodal, MD as Referring Physician (Dermatology) Lockie Mola, MD as Referring Physician (Ophthalmology) Gaspar Cola, [facility] (Inactive) as Pharmacist (Pharmacist)  ROS      Objective  BP 124/77 (BP Location: Left Arm, Patient Position: Sitting, Cuff Size: Normal)   Pulse 99   Ht 5\' 4"  (1.626 m)   Wt 178 lb 9.6 oz (81 kg)   SpO2 91%   BMI 30.66 kg/m    Physical Exam Vitals reviewed.  Constitutional:      General: He is not in acute distress.    Appearance: Normal appearance. He is well-developed. He is not diaphoretic.  HENT:     Head: Normocephalic and atraumatic.     Right Ear: Tympanic membrane, ear canal and external ear normal.     Left Ear: Tympanic membrane, ear canal and external ear normal.     Nose: Nose normal.     Mouth/Throat:     Mouth: Mucous membranes are moist.     Pharynx: Oropharynx is clear. No oropharyngeal exudate.  Eyes:     General: No scleral icterus.    Conjunctiva/sclera: Conjunctivae  normal.     Pupils: Pupils are equal, round, and reactive to light.  Neck:     Thyroid: No thyromegaly.  Cardiovascular:     Rate and Rhythm: Normal rate and regular rhythm.     Heart sounds: Normal heart sounds. No murmur heard. Pulmonary:     Effort: Pulmonary effort is normal. No respiratory distress.     Breath sounds: Normal breath sounds. No wheezing or rales.  Abdominal:     General: There is no distension.     Palpations: Abdomen is soft.     Tenderness: There is no abdominal tenderness.  Musculoskeletal:        General: No deformity.     Cervical back: Neck supple.     Right lower leg: No edema.  Left lower leg: No edema.  Lymphadenopathy:     Cervical: No cervical adenopathy.  Skin:    General: Skin is warm and dry.     Findings: No rash.  Neurological:     Mental Status: He is alert and oriented to person, place, and time. Mental status is at baseline.     Gait: Gait normal.  Psychiatric:        Mood and Affect: Mood normal.        Behavior: Behavior normal.        Thought Content: Thought content normal.       Most recent functional status assessment:    08/12/2023    2:14 PM  In your present state of health, do you have any difficulty performing the following activities:  Hearing? 0  Vision? 0  Difficulty concentrating or making decisions? 0  Walking or climbing stairs? 0  Dressing or bathing? 0  Doing errands, shopping? 0  Preparing Food and eating ? N  Using the Toilet? N  In the past six months, have you accidently leaked urine? N  Do you have problems with loss of bowel control? N  Managing your Medications? N  Managing your Finances? N  Housekeeping or managing your Housekeeping? N   Most recent fall risk assessment:    08/16/2023    8:22 AM  Fall Risk   Falls in the past year? 0  Number falls in past yr: 0  Injury with Fall? 0  Risk for fall due to : No Fall Risks  Follow up Falls evaluation completed    Most recent depression  screenings:    08/16/2023    8:23 AM 07/30/2023    1:52 PM  PHQ 2/9 Scores  PHQ - 2 Score 0 0  PHQ- 9 Score 4 6   Most recent cognitive screening:    04/19/2019    9:02 AM  6CIT Screen  What Year? 0 points  What month? 0 points  What time? 0 points  Count back from 20 0 points  Months in reverse 0 points  Repeat phrase 0 points  Total Score 0 points   Most recent Audit-C alcohol use screening    08/12/2023    2:14 PM  Alcohol Use Disorder Test (AUDIT)  1. How often do you have a drink containing alcohol? 1  2. How many drinks containing alcohol do you have on a typical day when you are drinking? 0  3. How often do you have six or more drinks on one occasion? 0  AUDIT-C Score 1   A score of 3 or more in women, and 4 or more in men indicates increased risk for alcohol abuse, EXCEPT if all of the points are from question 1   Vision/Hearing Screen: No results found.    Results for orders placed or performed in visit on 08/16/23  Hemoglobin A1c  Result Value Ref Range   Hemoglobin A1C 7.0       Assessment & Plan   Annual wellness visit done today including the all of the following: Reviewed patient's Family Medical History Reviewed and updated list of patient's medical providers Assessment of cognitive impairment was done Assessed patient's functional ability Established a written schedule for health screening services Health Risk Assessent Completed and Reviewed  Exercise Activities and Dietary recommendations  Goals      DIET - REDUCE PORTION SIZE     Recommend to decrease portion sizes by eating 3 small healthy meals and at least  2 healthy snacks per day.        Immunization History  Administered Date(s) Administered   Fluad Quad(high Dose 65+) 08/05/2020, 08/02/2022, 08/02/2022   Influenza, High Dose Seasonal PF 07/24/2015, 07/21/2016, 08/02/2017, 08/06/2017, 07/27/2018   Influenza,inj,Quad PF,6+ Mos 06/27/2019   Influenza-Unspecified 09/14/2012,  07/19/2021, 08/08/2023   Moderna SARS-COV2 Booster Vaccination 07/06/2021   PFIZER(Purple Top)SARS-COV-2 Vaccination 11/10/2019, 12/01/2019, 07/14/2020   Pfizer Covid-19 Vaccine Bivalent Booster 70yrs & up 01/26/2021   Pfizer(Comirnaty)Fall Seasonal Vaccine 12 years and older 08/02/2022   Pneumococcal Conjugate-13 08/25/2014   Pneumococcal Polysaccharide-23 01/26/2016   Pneumococcal-Unspecified 12/17/2005   Rsv, Bivalent, Protein Subunit Rsvpref,pf Verdis Frederickson) 09/05/2022   Tdap 12/17/2005, 02/25/2016   Zoster Recombinant(Shingrix) 08/23/2021, 06/05/2022   Zoster, Live 11/24/2013    Health Maintenance  Topic Date Due   COVID-19 Vaccine (6 - 2023-24 season) 06/20/2023   Diabetic kidney evaluation - Urine ACR  10/23/2023   FOOT EXAM  10/23/2023   Diabetic kidney evaluation - eGFR measurement  12/22/2023   HEMOGLOBIN A1C  12/22/2023   OPHTHALMOLOGY EXAM  01/14/2024   Lung Cancer Screening  05/03/2024   Medicare Annual Wellness (AWV)  08/15/2024   DTaP/Tdap/Td (3 - Td or Tdap) 02/24/2026   Colonoscopy  10/18/2028   Pneumonia Vaccine 47+ Years old  Completed   INFLUENZA VACCINE  Completed   Hepatitis C Screening  Completed   Zoster Vaccines- Shingrix  Completed   HPV VACCINES  Aged Out     Discussed health benefits of physical activity, and encouraged him to engage in regular exercise appropriate for his age and condition.    Problem List Items Addressed This Visit       Cardiovascular and Mediastinum   Hypertension associated with diabetes (HCC)    Well controlled Continue current medications Reviewed metabolic panel F/u in 6 months       Relevant Medications   insulin glargine (LANTUS SOLOSTAR) 100 UNIT/ML Solostar Pen   Senile purpura (HCC)    stable        Respiratory   COPD (chronic obstructive pulmonary disease) (HCC)    Chronic Obstructive Pulmonary Disease (COPD) Patient reports needing oxygen 24 hours a day during a recent exacerbation. Current oxygen  saturation is 89-92%. -Continue current COPD management. -Consider sleep study to evaluate for sleep apnea.        Endocrine   Hyperlipidemia associated with type 2 diabetes mellitus (HCC)    Previously well controlled with last LDL 34 Continue statin Repeat FLP and LFTs annually Goal LDL < 70       Relevant Medications   insulin glargine (LANTUS SOLOSTAR) 100 UNIT/ML Solostar Pen   Diabetes mellitus with proteinuria (HCC)    Hypoglycemia Episodes of anxiety, irritability, and nervousness likely secondary to hypoglycemia. Patient has been on Ozempic 1mg  weekly, Farxiga 10mg  daily, Lantus 53 units at bedtime, Humalog 22 units with breakfast and 12 units with supper, and Metformin 1000mg  twice daily. Recent weight loss of 6-7 pounds since starting Ozempic 1mg . -Reduce Lantus to 50 units at bedtime. -Check blood sugar when experiencing symptoms of anxiety. -Message provider with details of blood sugar readings if symptoms persist or blood sugars are consistently low. - f/b Endocrinology - next visit in 2/25      Relevant Medications   insulin glargine (LANTUS SOLOSTAR) 100 UNIT/ML Solostar Pen     Other   Insomnia    Insomnia Chronic insomnia despite taking Ambien. Patient reports waking up every two hours and needing to use the bathroom. -Consider sleep  study to evaluate for sleep apnea. -Continue Ambien as prescribed.      Anxiety   Other Visit Diagnoses     Encounter for annual wellness visit (AWV) in Medicare patient    -  Primary   Encounter for annual physical exam              Sinusitis Dry sinuses and mouth. Improvement with daily salt rinse. -Continue daily salt rinse.  General Health Maintenance -Continue current cardiac medications (Amlodipine 5mg  daily, Ramipril 10mg  in the morning and 5mg  at night, Atorvastatin 10mg  daily). -Next lung cancer screening due in July 2025. -Flu shot and COVID-19 vaccine up to date. -Six-month follow-up appointment  scheduled.        Return in about 6 months (around 02/14/2024) for chronic disease f/u.     Shirlee Latch, MD

## 2023-08-16 NOTE — Assessment & Plan Note (Signed)
Previously well controlled with last LDL 34 Continue statin Repeat FLP and LFTs annually Goal LDL < 70

## 2023-08-16 NOTE — Assessment & Plan Note (Signed)
Well controlled Continue current medications Reviewed metabolic panel F/u in 6 months  

## 2023-08-17 ENCOUNTER — Other Ambulatory Visit: Payer: Self-pay | Admitting: Family Medicine

## 2023-08-17 NOTE — Telephone Encounter (Signed)
Requested medication (s) are due for refill today: Yes  Requested medication (s) are on the active medication list: Yes  Last refill:  08/26/22  Future visit scheduled: Yes  Notes to clinic:  Unable to refill per protocol due to failed labs, no updated results.      Requested Prescriptions  Pending Prescriptions Disp Refills   tamsulosin (FLOMAX) 0.4 MG CAPS capsule [Pharmacy Med Name: TAMSULOSIN 0.4MG  CAPSULES] 180 capsule 3    Sig: TAKE 2 CAPSULES(0.8 MG) BY MOUTH DAILY     Urology: Alpha-Adrenergic Blocker Failed - 08/17/2023  6:30 AM      Failed - PSA in normal range and within 360 days    PSA  Date Value Ref Range Status  04/06/2018 0.6  Final   Prostate Specific Ag, Serum  Date Value Ref Range Status  08/14/2022 0.3 0.0 - 4.0 ng/mL Final    Comment:    Roche ECLIA methodology. According to the American Urological Association, Serum PSA should decrease and remain at undetectable levels after radical prostatectomy. The AUA defines biochemical recurrence as an initial PSA value 0.2 ng/mL or greater followed by a subsequent confirmatory PSA value 0.2 ng/mL or greater. Values obtained with different assay methods or kits cannot be used interchangeably. Results cannot be interpreted as absolute evidence of the presence or absence of malignant disease.          Passed - Last BP in normal range    BP Readings from Last 1 Encounters:  08/16/23 124/77         Passed - Valid encounter within last 12 months    Recent Outpatient Visits           Yesterday Encounter for annual wellness visit (AWV) in Medicare patient   [facility] [place], Marzella Schlein, MD   2 weeks ago Anxiety   [facility] [place], [place], PA-C   6 months ago Diabetes mellitus with proteinuria [facility])   [place] [facility] [place], Marzella Schlein, MD   9 months ago Coronary artery disease of native artery of native heart with stable  angina pectoris [facility])   [place] [facility] [place], Marzella Schlein, MD   1 year ago Annual physical exam   [place] [facility] Jacky Kindle, FNP       Future Appointments             In 6 months Bac[place]al[place], Marzella Schlein, MD [facility]            Signed Prescriptions Disp Refills   oxybutynin (DITROPAN-XL) 10 MG 24 hr tablet 90 tablet 0    Sig: TAKE 1 TABLET(10 MG) BY MOUTH DAILY     Urology:  Bladder Agents Passed - 08/17/2023  6:30 AM      Passed - Valid encounter within last 12 months    Recent Outpatient Visits           Yesterday Encounter for annual wellness visit (AWV) in Medicare patient   [facility] [place], Marzella Schlein, MD   2 weeks ago Anxiety   [place] [facility] [place], [place], PA-C   6 months ago Diabetes mellitus with proteinuria [facility])   [place] [facility] [place], Marzella Schlein, MD   9 months ago Coronary artery disease of native artery of native heart with stable angina pectoris [facility])    [facility] [place], Marzella Schlein, MD  1 year ago Annual physical exam   [facility] Jacky Kindle, FNP       Future Appointments             In 6 months Bacigalupo, Marzella Schlein, MD [facility]

## 2023-08-17 NOTE — Telephone Encounter (Signed)
Requested Prescriptions  Pending Prescriptions Disp Refills   tamsulosin (FLOMAX) 0.4 MG CAPS capsule [Pharmacy Med Name: TAMSULOSIN 0.4MG  CAPSULES] 180 capsule 3    Sig: TAKE 2 CAPSULES(0.8 MG) BY MOUTH DAILY     Urology: Alpha-Adrenergic Blocker Failed - 08/17/2023  6:30 AM      Failed - PSA in normal range and within 360 days    PSA  Date Value Ref Range Status  04/06/2018 0.6  Final   Prostate Specific Ag, Serum  Date Value Ref Range Status  08/14/2022 0.3 0.0 - 4.0 ng/mL Final    Comment:    Roche ECLIA methodology. According to the American Urological Association, Serum PSA should decrease and remain at undetectable levels after radical prostatectomy. The AUA defines biochemical recurrence as an initial PSA value 0.2 ng/mL or greater followed by a subsequent confirmatory PSA value 0.2 ng/mL or greater. Values obtained with different assay methods or kits cannot be used interchangeably. Results cannot be interpreted as absolute evidence of the presence or absence of malignant disease.          Passed - Last BP in normal range    BP Readings from Last 1 Encounters:  08/16/23 124/77         Passed - Valid encounter within last 12 months    Recent Outpatient Visits           Yesterday Encounter for annual wellness visit (AWV) in Medicare patient   [facility] [place], Marzella Schlein, MD   2 weeks ago Anxiety   [place] [facility] [place], [place], PA-C   6 months ago Diabetes mellitus with proteinuria [facility])   [place] [facility] [place], Marzella Schlein, MD   9 months ago Coronary artery disease of native artery of native heart with stable angina pectoris [facility])   [place] [facility] [place], Marzella Schlein, MD   1 year ago Annual physical exam   [facility] Jacky Kindle, FNP       Future Appointments             In 6 months Bacigalupo, Marzella Schlein, MD [facility]             oxybutynin (DITROPAN-XL) 10 MG 24 hr tablet [Pharmacy Med Name: OXYBUTYNIN ER 10MG  TABLETS] 90 tablet 0    Sig: TAKE 1 TABLET(10 MG) BY MOUTH DAILY     Urology:  Bladder Agents Passed - 08/17/2023  6:30 AM      Passed - Valid encounter within last 12 months    Recent Outpatient Visits           Yesterday Encounter for annual wellness visit (AWV) in Medicare patient   [facility] [place], Marzella Schlein, MD   2 weeks ago Anxiety   [place] [facility] [place], [place], PA-C   6 months ago Diabetes mellitus with proteinuria [facility])   [place] [facility] [place], Marzella Schlein, MD   9 months ago Coronary artery disease of native artery of native heart with stable angina pectoris [facility])    [facility] [place], Marzella Schlein, MD   1 year ago Annual physical exam   [facility] Jacky Kindle, FNP       Future Appointments             In 6 months Bacigalupo, Marzella Schlein, MD [facility], [facility]

## 2023-08-19 ENCOUNTER — Encounter: Payer: Self-pay | Admitting: Pulmonary Disease

## 2023-08-19 ENCOUNTER — Ambulatory Visit: Payer: Medicare PPO | Admitting: Pulmonary Disease

## 2023-08-19 ENCOUNTER — Telehealth: Payer: Self-pay

## 2023-08-19 VITALS — BP 138/78 | HR 99 | Temp 97.5°F | Ht 64.0 in | Wt 180.0 lb

## 2023-08-19 DIAGNOSIS — Z87891 Personal history of nicotine dependence: Secondary | ICD-10-CM

## 2023-08-19 DIAGNOSIS — J449 Chronic obstructive pulmonary disease, unspecified: Secondary | ICD-10-CM | POA: Diagnosis not present

## 2023-08-19 DIAGNOSIS — J9611 Chronic respiratory failure with hypoxia: Secondary | ICD-10-CM

## 2023-08-19 DIAGNOSIS — R0602 Shortness of breath: Secondary | ICD-10-CM

## 2023-08-19 NOTE — Telephone Encounter (Signed)
Billy Wise referral to [employer]  Fax: [phone] Phone: [phone]  Chesley Mires, PharmD, MPH, BCPS, CPP Clinical Pharmacist (Rheumatology and Pulmonology)

## 2023-08-19 NOTE — Patient Instructions (Signed)
VISIT SUMMARY:  During today's visit, we discussed your recent health improvements and addressed your ongoing management of COPD and bronchiectasis. We also reviewed your nasal dryness and the steps you have taken to alleviate it.  YOUR PLAN:  -COPD WITH BRONCHIECTASIS: COPD with bronchiectasis is a chronic lung condition that causes difficulty breathing and mucus buildup. You have shown improvement since your last exacerbation. We will start you on Ohtuvayre via nebulizer twice daily, 15 minutes after using Breztri, to help with inflammation and mucus production. Continue using Breztri and albuterol as needed. If the cost of Sharyn Blitz is an issue, check with the specialty pharmacy for assistance programs.  -NASAL DRYNESS: Nasal dryness can be a side effect of your medications. You have started using a saline nasal rinse twice daily, which has been effective. Continue this regimen to keep your nasal passages moist.  INSTRUCTIONS:  Please follow up in 6-8 weeks to assess your response to the new medication.

## 2023-08-19 NOTE — Telephone Encounter (Signed)
Patient was seen in the office today. Dr. Jayme Cloud has ordered [facility] for the patient.  He has signed the form and it has been faxed to the pharmacy team.

## 2023-08-19 NOTE — Progress Notes (Signed)
Subjective:    Patient ID: Billy Wise, male    DOB: Mar 13, 1946, 77 y.o.   MRN: [id number]  Patient Care Team: Erasmo Downer, MD as PCP - General (Family Medicine) Delorise Royals, MD as Referring Physician (Endocrinology) Francee Nodal, MD as Referring Physician (Dermatology) Lockie Mola, MD as Referring Physician (Ophthalmology) Gaspar Cola, [facility] (Inactive) as Pharmacist (Pharmacist)  Chief Complaint  Patient presents with   Follow-up    DOE. No wheezing or cough.   BACKGROUND/INTERVAL:Mr. Mckinnies is a 77 year old former smoker who follows here for the issue of COPD with chronic hypoxic respiratory failure due to the same.  He was last seen for a ACUTE visit on 13 July 2023.  Overall he feels better still feels that he is slow to progress.  He has participated in pulmonary rehab previously.  HPI Discussed the use of AI scribe software for clinical note transcription with the patient, who gave verbal consent to proceed.  History of Present Illness   The patient, with a history of COPD and bronchiectasis, reports a significant improvement in his health status since his last exacerbation. He describes a particularly challenging period in September, which he suspects may have been a COVID-19 infection, characterized by severe mucus production and breathing difficulties. He has not experienced such severity with previous pneumonia episodes or common colds.  The patient is currently on Breztri and albuterol, the latter of which he seldom uses, preferring to utilize oxygen when exerting himself. He reports no current issues with mucus production or breathing difficulties when at rest or during sedentary work. However, he notes a drop in oxygen saturation during physical activity, necessitating the use of supplemental oxygen.  He previously used a nebulizer frequently, but the solution was changed, and he is unsure of its current necessity. He has  been advised to use it as a rescue device when experiencing significant breathing difficulties or trouble with mucus clearance.  The patient also reports a recent consultation with an ear, nose, and throat specialist due to nasal issues. He was found to have significant nasal crusting, likely due to drying effects of his medications. He has started a saline nasal rinse regimen, which, despite being unpleasant, has significantly improved his symptoms.  The patient completed a pulmonary rehabilitation program following a pneumonia episode in 2019. He has not reported any fevers, chills, or sweats since his last consultation.     DATA: CBC 10/28/17>>eosinophils equals 0. Sleep Study 11/27/17>> no sleep apnea.  Nocturnal desaturations noted. Desat walk 11/15/17>> baseline sat on RA at rest was 91% and HR 89. Walked 360 feet at a very brisk pace, was conversational. Sat was 88% and HR 112.  Moderate dyspnea. CT chest personally reviewed>> CT hi-res  02/01/18; chest x-ray 10/28/17; there is mild basilar fibrosis, bronchiectatic changes with bronchial thickening, severe apical emphysema. Findings not likely due to UIP.  PFT tracings personally reviewed from 02/01/18>> FVC is 86% predicted, FEV1 is 67% predicted, there is no improvement with bronchodilator.  Ratio 61%. DLCO is reduced at 52%.Flow volume loop is obstructive. Ambulatory oximetry 07/19/2020: Desaturations with exercise, initiated oxygen at 2 L/min 2D echocardiogram 07/26/2020: LVEF 60 to 65%, grade 1 DD, moderate elevation on pulmonary artery systolic pressure. PFTs 07/31/2020: FEV1 1.57 L or 57% predicted, FVC 2.79 L or 74% predicted.  FEV1/FVC 56%.  Postbronchodilator there is a 10% change in FEV1 lung volumes showed mild air trapping no hyperinflation, diffusion capacity moderately reduced.  Consistent with COPD/emphysema.  Flow  volume loop is delayed consistent with obstructive lung disease. Alpha 1 antitrypsin 12/15/2022: MM phenotype, level 128  mg/dL PFTs 65/78/4696: FEV1 2.95 L or 51% predicted, FVC 2.55 L or 73% predicted, FEV1/FVC 51%, no significant bronchodilator response.  Lung volumes show air trapping.  Flow volume loop is consistent with obstruction.  Diffusion capacity is severely impaired.  This is consistent with COPD on the basis of emphysema.  As the prior study of 31 July 2020 there has been minimal decline in function. CT chest low-dose 05/04/2023: Lung RADS 2 benign appearance and behavior.  Bilateral lower lobe cylindrical bronchiectasis.  Moderate emphysema.  Prior pulmonary nodules resolve indicating inflammatory/infectious cause. Chest x-ray PA and lateral 07/13/2023: No active cardiopulmonary disease.  Review of Systems A 10 point review of systems was performed and it is as noted above otherwise negative.   Patient Active Problem List   Diagnosis Date Noted   Anxiety 07/31/2023   Accelerating angina (HCC) 12/29/2022   Coronary artery disease of native artery of native heart with stable angina pectoris (HCC) 11/05/2022   Aortic atherosclerosis (HCC) 11/05/2022   Senile purpura (HCC) 08/14/2022   Diabetes mellitus with proteinuria (HCC) 08/14/2022   Venous stasis 08/07/2021   Other secondary pulmonary hypertension (HCC) 07/26/2020   Diastolic dysfunction 07/26/2020   Obesity 10/25/2019   History of anemia 04/20/2019   Gastroesophageal reflux disease without esophagitis 09/09/2018   Hyperlipidemia associated with type 2 diabetes mellitus (HCC) 09/09/2018   COPD (chronic obstructive pulmonary disease) (HCC) 10/28/2017   Hypertension associated with diabetes (HCC) 10/28/2017   Insomnia 09/03/2016   BPH with obstruction/lower urinary tract symptoms 12/16/2015    Social History   Tobacco Use   Smoking status: Former    Current packs/day: 0.00    Average packs/day: 1.5 packs/day for 50.0 years (75.0 ttl pk-yrs)    Types: Cigarettes    Start date: 09/29/1958    Quit date: 09/29/2008    Years since  quitting: 14.8   Smokeless tobacco: Never  Substance Use Topics   Alcohol use: Not Currently    Comment: 2 drinks a month / cocktail    No Known Allergies  Current Meds  Medication Sig   Accu-Chek Softclix Lancets lancets Use to check blood sugar twice a day. DX E11.9   albuterol (PROVENTIL) (2.5 MG/3ML) 0.083% nebulizer solution Take 3 mLs (2.5 mg total) by nebulization every 6 (six) hours as needed for wheezing or shortness of breath.   albuterol (VENTOLIN HFA) 108 (90 Base) MCG/ACT inhaler Inhale 2 puffs into the lungs every 6 (six) hours as needed for wheezing or shortness of breath.   amLODipine (NORVASC) 5 MG tablet Take 1 tablet (5 mg total) by mouth daily.   ascorbic acid (VITAMIN C) 1000 MG tablet Take 1 tablet by mouth daily.   aspirin EC 81 MG tablet Take 1 tablet (81 mg total) by mouth daily. Swallow whole.   Blood Glucose Monitoring Suppl (ACCU-CHEK AVIVA PLUS) w/Device KIT Use to check blood sugar twice a day.  DX E11.9   Budeson-Glycopyrrol-Formoterol (BREZTRI AEROSPHERE) 160-9-4.8 MCG/ACT AERO INHALE 2 PUFFS INTO THE LUNGS IN THE MORNING AND AT BEDTIME   dapagliflozin propanediol (FARXIGA) 10 MG TABS tablet Take 1 tablet (10 mg total) by mouth daily.   finasteride (PROSCAR) 5 MG tablet TAKE 1 TABLET(5 MG) BY MOUTH DAILY   glucose blood (ACCU-CHEK GUIDE) test strip Use as instructed   insulin glargine (LANTUS SOLOSTAR) 100 UNIT/ML Solostar Pen Inject 50 Units into the skin at bedtime.  insulin lispro (HUMALOG) 100 UNIT/ML KwikPen Junior breakfast 22 units and supper 12 units directly with food   Insulin Pen Needle (PEN NEEDLES) 31G X 5 MM MISC 1 each by Does not apply route daily as needed.   isosorbide mononitrate (IMDUR) 30 MG 24 hr tablet Take 0.5 tablets (15 mg total) by mouth daily.   Lactobacillus (PROBIOTIC ACIDOPHILUS PO) Take 1 capsule by mouth daily.    metFORMIN (GLUCOPHAGE) 1000 MG tablet TAKE 1 TABLET(1000 MG) BY MOUTH TWICE DAILY   nitroGLYCERIN (NITROSTAT)  0.4 MG SL tablet Place 1 tablet (0.4 mg total) under the tongue every 5 (five) minutes as needed for chest pain.   omeprazole (PRILOSEC) 20 MG capsule Take 1 capsule (20 mg total) by mouth daily.   oxybutynin (DITROPAN-XL) 10 MG 24 hr tablet TAKE 1 TABLET(10 MG) BY MOUTH DAILY   ramipril (ALTACE) 5 MG capsule Take 2 capsules (10 mg total) by mouth daily AND 1 capsule (5 mg total) at bedtime.   rosuvastatin (CRESTOR) 10 MG tablet Take 1 tablet (10 mg total) by mouth daily.   Semaglutide, 1 MG/DOSE, (OZEMPIC, 1 MG/DOSE,) 4 MG/3ML SOPN Inject into the skin. Once per week   tamsulosin (FLOMAX) 0.4 MG CAPS capsule TAKE 2 CAPSULES(0.8 MG) BY MOUTH DAILY   zolpidem (AMBIEN) 10 MG tablet TAKE 1 TABLET(10 MG) BY MOUTH AT BEDTIME    Immunization History  Administered Date(s) Administered   Fluad Quad(high Dose 65+) 08/05/2020, 08/02/2022, 08/02/2022   Influenza, High Dose Seasonal PF 07/24/2015, 07/21/2016, 08/02/2017, 08/06/2017, 07/27/2018   Influenza,inj,Quad PF,6+ Mos 06/27/2019   Influenza-Unspecified 09/14/2012, 07/19/2021, 08/08/2023   Moderna SARS-COV2 Booster Vaccination 07/06/2021   PFIZER(Purple Top)SARS-COV-2 Vaccination 11/10/2019, 12/01/2019, 07/14/2020   Pfizer Covid-19 Vaccine Bivalent Booster 89yrs & up 01/26/2021   Pfizer(Comirnaty)Fall Seasonal Vaccine 12 years and older 08/02/2022   Pneumococcal Conjugate-13 08/25/2014   Pneumococcal Polysaccharide-23 01/26/2016   Pneumococcal-Unspecified 12/17/2005   Rsv, Bivalent, Protein Subunit Rsvpref,pf Verdis Frederickson) 09/05/2022   Tdap 12/17/2005, 02/25/2016   Zoster Recombinant(Shingrix) 08/23/2021, 06/05/2022   Zoster, Live 11/24/2013        Objective:     BP 138/78 (BP Location: Right Arm, Cuff Size: Normal)   Pulse 99   Temp (!) 97.5 F (36.4 C)   Ht 5\' 4"  (1.626 m)   Wt 180 lb (81.6 kg)   SpO2 93%   BMI 30.90 kg/m   SpO2: 93 % O2 Device: None (Room air)  GENERAL: Awake, alert, no respiratory distress.  Well-appearing,  fully ambulatory. HEAD: Normocephalic, atraumatic.  EYES: Pupils equal, round, reactive to light.  No scleral icterus.  MOUTH: Oral mucosa moist.  Dentition intact. NECK: Supple. No thyromegaly. Trachea midline. No JVD.  No adenopathy. PULMONARY: Good air entry bilaterally.  Coarse, dry crackles at bases (chronic), no other adventitious sounds. CARDIOVASCULAR: S1 and S2.  No rubs, murmurs or gallops heard. ABDOMEN: Protuberant, truncal obesity, otherwise benign. MUSCULOSKELETAL: No joint deformity, no clubbing, trace edema.  NEUROLOGIC: No deficits, no gait disturbance, speech is fluent. SKIN: Intact,warm,dry.  Bilateral stasis changes lower extremities. PSYCH: Mood and behavior normal.   Assessment & Plan:     ICD-10-CM   1. COPD, moderate (HCC) Moderate/Severe  J44.9     2. Chronic respiratory failure with hypoxia (HCC)  J96.11     3. SOB (shortness of breath)  R06.02     4. Former smoker  Z87.891      Discussion:    COPD with Bronchiectasis Improved since last exacerbation. Currently on Breztri and albuterol as  needed. Discussed the addition of Ohtuvayre to help with inflammation and mucus production due to COPD with bronchiectasis overlap. -Start Ohtuvayre via nebulizer twice daily, 15 minutes after [employer]. -Continue Breztri and albuterol as needed. -Check with specialty pharmacy for assistance program if cost is prohibitive.  Nasal Dryness Discussed the drying effects of medications, particularly Ditropan. Patient has started using a saline rinse with good effect. -Continue saline nasal rinse twice daily.  Follow-up in 6-8 weeks to assess response to new medication.       Gailen Shelter, MD Advanced Bronchoscopy PCCM [facility]    *This note was generated using voice recognition software/Dragon and/or AI transcription program.  Despite best efforts to proofread, errors can occur which can change the meaning. Any transcriptional errors that  result from this process are unintentional and may not be fully corrected at the time of dictation.

## 2023-08-23 NOTE — Telephone Encounter (Signed)
Received fax from [facility] that patient has been entered into portal. Rx has been triaged to [facility]  Patient ID: [id number] Phone: [phone]  Chesley Mires, PharmD, MPH, BCPS, CPP Clinical Pharmacist (Rheumatology and Pulmonology)

## 2023-08-24 ENCOUNTER — Other Ambulatory Visit: Payer: Self-pay | Admitting: Physician Assistant

## 2023-08-24 DIAGNOSIS — F419 Anxiety disorder, unspecified: Secondary | ICD-10-CM

## 2023-08-25 DIAGNOSIS — D0422 Carcinoma in situ of skin of left ear and external auricular canal: Secondary | ICD-10-CM | POA: Diagnosis not present

## 2023-08-26 IMAGING — CT CT CHEST LUNG CANCER SCREENING LOW DOSE W/O CM
2 of 5 series · 15 of 40 positions shown, 18 images · non-contrast
Comparison: 02/01/2018 high-resolution chest CT.

CLINICAL DATA: 74-year-old asymptomatic male former smoker with 80
pack-year smoking history, quit smoking 12 years prior.

EXAM:
CT CHEST WITHOUT CONTRAST LOW-DOSE FOR LUNG CANCER SCREENING
TECHNIQUE: Multidetector CT imaging of the chest was performed following the
standard protocol without IV contrast.

[Series 3: lung 1.00 · axial · 0.72mm/px · z∈[-1205,-896]mm · 12 of 341 slices shown, 15 images]
[im 16/341  mediastinal]
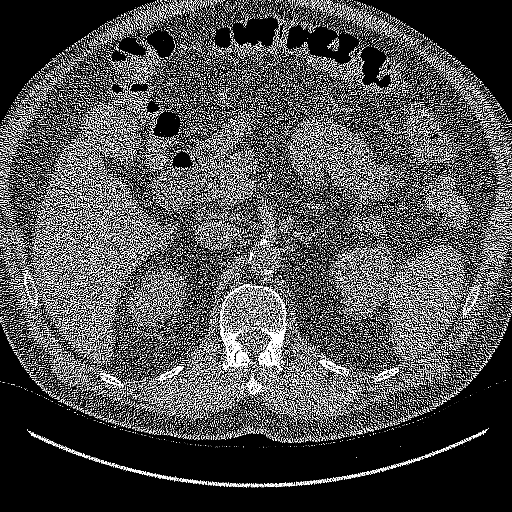
[im 16/341  lung]
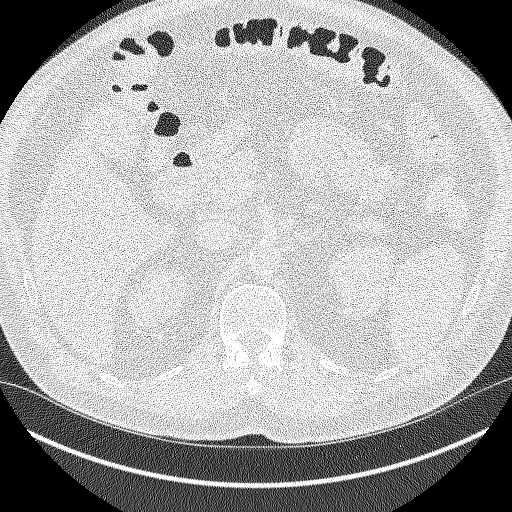
[im 47/341  lung]
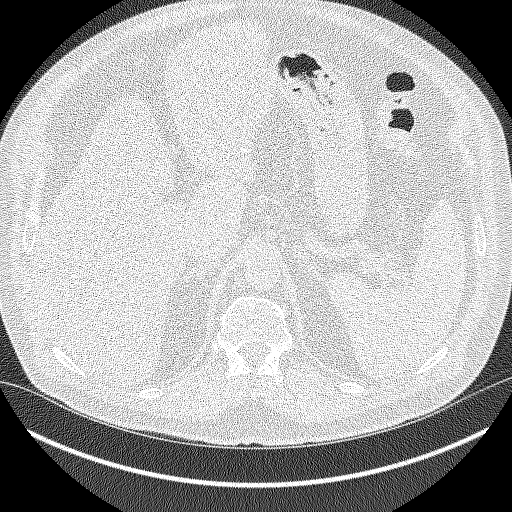
[im 78/341  lung]
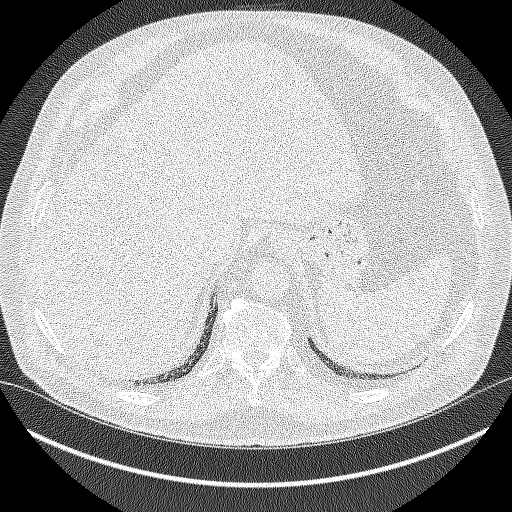
[im 109/341  lung]
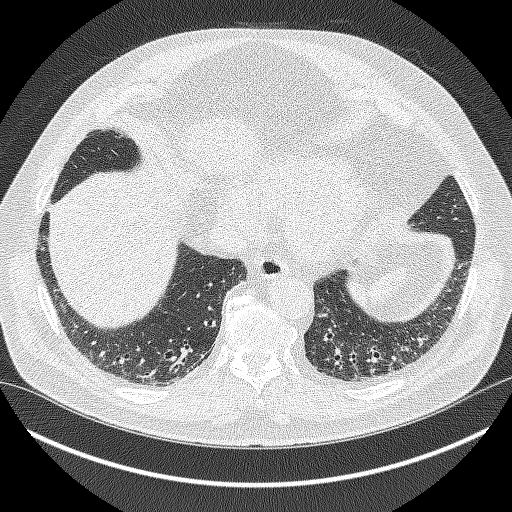
[im 124/341  mediastinal]
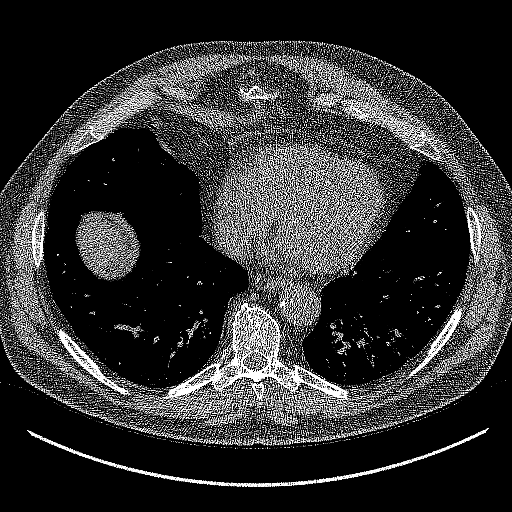
[im 124/341  lung]
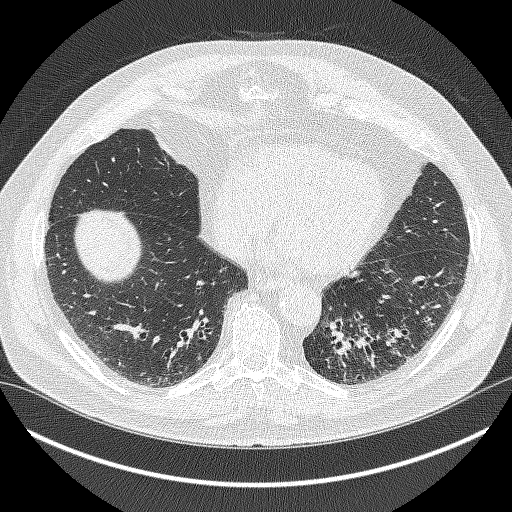
[im 155/341  lung]
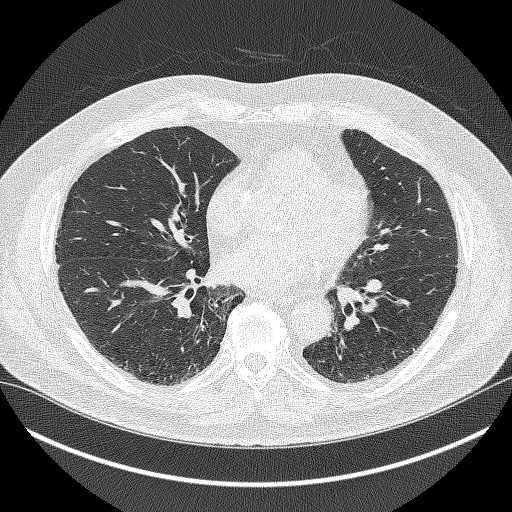
[im 186/341  lung]
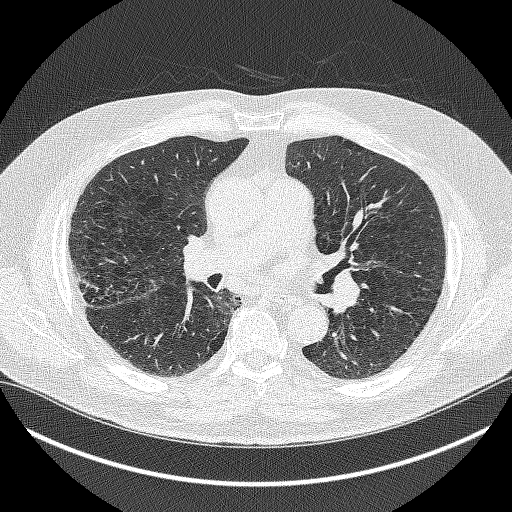
[im 217/341  lung]
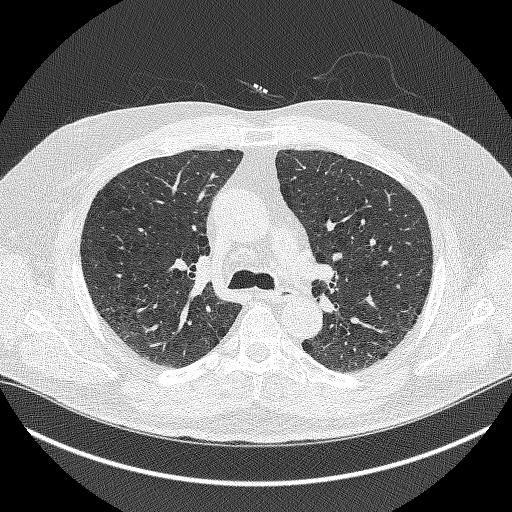
[im 232/341  mediastinal]
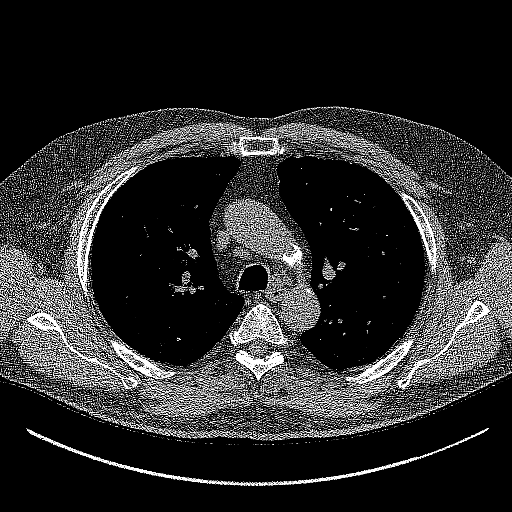
[im 232/341  lung]
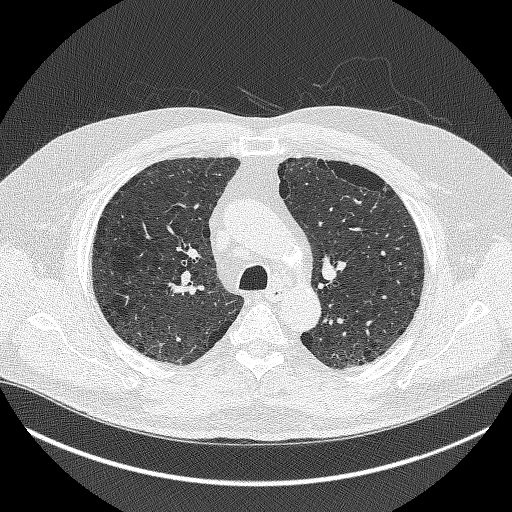
[im 263/341  lung]
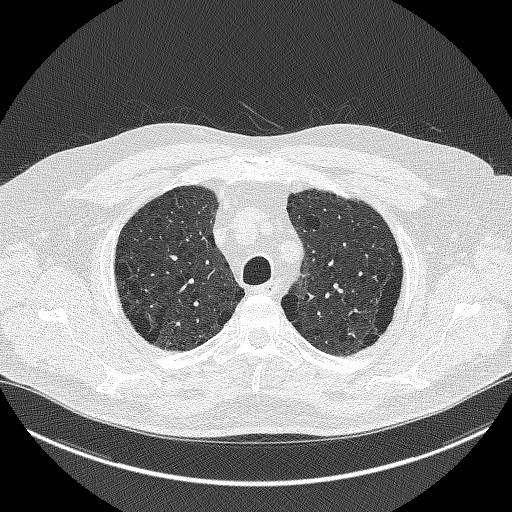
[im 294/341  lung]
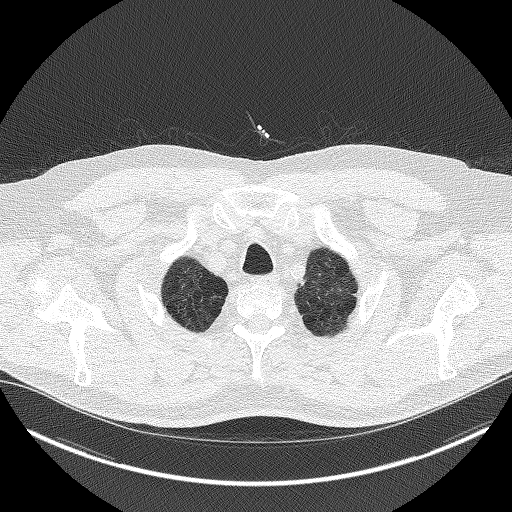
[im 325/341  lung]
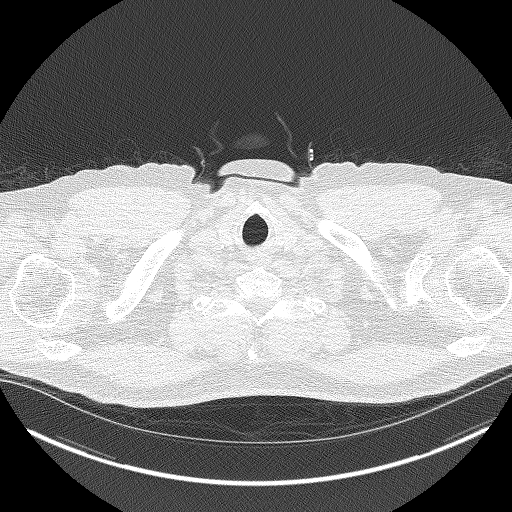

[Series 5: coronals lung 1.00 cor · coronal · 0.67mm/px · 3 of 309 slices shown]
[im 62/309  lung]
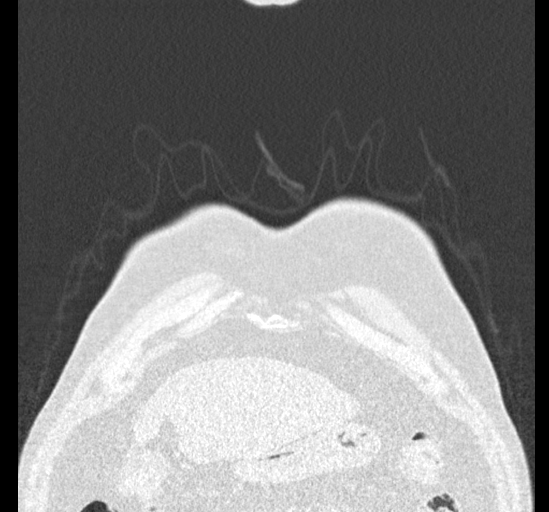
[im 124/309  lung]
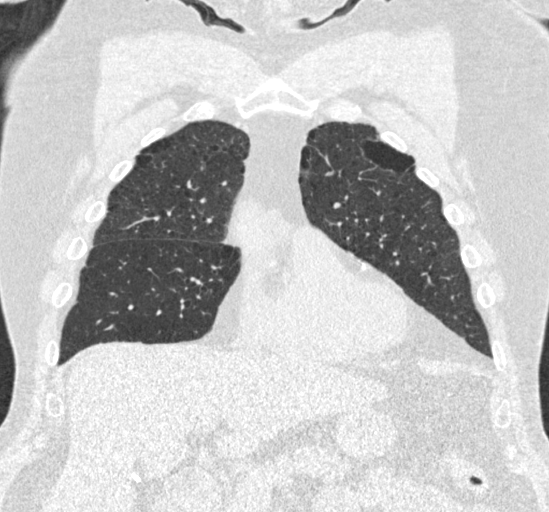
[im 185/309  lung]
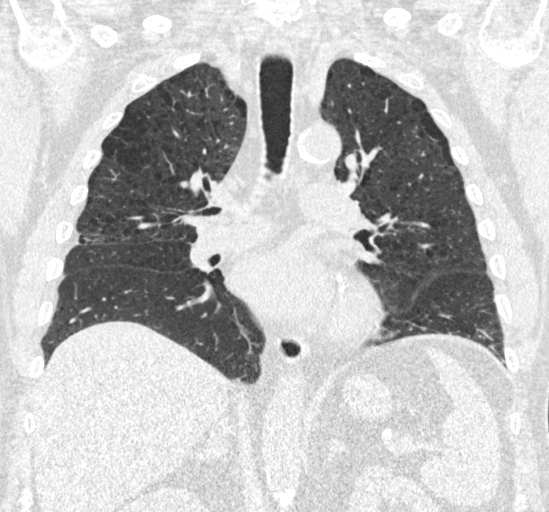

[15 of 40 positions shown; findings below may reference images not displayed]

FINDINGS: Cardiovascular: Normal heart size. No significant pericardial
effusion/thickening. Left anterior descending and left circumflex
coronary atherosclerosis. Atherosclerotic nonaneurysmal thoracic
aorta. Top-normal caliber main pulmonary artery (3.2 cm diameter).

Mediastinum/Nodes: No discrete thyroid nodules. Unremarkable
esophagus. No axillary adenopathy. Mildly enlarged 1.0 cm subcarinal
node (series 2/image 31), stable, presumably benign and reactive. No
additional pathologically enlarged mediastinal nodes. No discrete
hilar adenopathy on these noncontrast images.

Lungs/Pleura: No pneumothorax. No pleural effusion. Moderate
centrilobular and paraseptal emphysema with diffuse bronchial wall
thickening. No acute consolidative airspace disease or lung masses.
A few scattered solid pulmonary nodules in both lungs, largest
mm in volume derived mean diameter in the anterior right lower lobe
along the major fissure (series 3/image 167), all stable. No new
significant pulmonary nodules.

Upper abdomen: Cholecystectomy.

Musculoskeletal: No aggressive appearing focal osseous lesions.
Marked thoracic spondylosis.
IMPRESSION: 1. Lung-RADS 2, benign appearance or behavior. Continue annual
screening with low-dose chest CT without contrast in 12 months.
2. Two-vessel coronary atherosclerosis.
3. Aortic Atherosclerosis (QJCWD-REI.I) and Emphysema (QJCWD-HQM.8).

## 2023-08-27 NOTE — Telephone Encounter (Signed)
Submitted a Prior Authorization request to [facility] for [facility] via CoverMyMeds. Will update once we receive a response.  Key: [id number]

## 2023-08-30 NOTE — Telephone Encounter (Signed)
Received notification from [facility] regarding a prior authorization for [facility] under MEDICARE PART B. Authorization has been APPROVED from 08/27/2023 to 10/18/2024. Approval letter sent to scan center.  Phone # [phone]  Chesley Mires, PharmD, MPH, BCPS, CPP Clinical Pharmacist (Rheumatology and Pulmonology)

## 2023-08-31 DIAGNOSIS — F419 Anxiety disorder, unspecified: Secondary | ICD-10-CM | POA: Diagnosis not present

## 2023-08-31 DIAGNOSIS — E785 Hyperlipidemia, unspecified: Secondary | ICD-10-CM | POA: Diagnosis not present

## 2023-08-31 DIAGNOSIS — Z794 Long term (current) use of insulin: Secondary | ICD-10-CM | POA: Diagnosis not present

## 2023-08-31 DIAGNOSIS — E65 Localized adiposity: Secondary | ICD-10-CM | POA: Diagnosis not present

## 2023-08-31 DIAGNOSIS — I10 Essential (primary) hypertension: Secondary | ICD-10-CM | POA: Diagnosis not present

## 2023-08-31 DIAGNOSIS — I2510 Atherosclerotic heart disease of native coronary artery without angina pectoris: Secondary | ICD-10-CM | POA: Diagnosis not present

## 2023-08-31 DIAGNOSIS — Z8249 Family history of ischemic heart disease and other diseases of the circulatory system: Secondary | ICD-10-CM | POA: Diagnosis not present

## 2023-08-31 DIAGNOSIS — J449 Chronic obstructive pulmonary disease, unspecified: Secondary | ICD-10-CM | POA: Diagnosis not present

## 2023-08-31 DIAGNOSIS — E1121 Type 2 diabetes mellitus with diabetic nephropathy: Secondary | ICD-10-CM | POA: Diagnosis not present

## 2023-09-01 ENCOUNTER — Telehealth: Payer: Medicare PPO | Admitting: Family Medicine

## 2023-09-01 ENCOUNTER — Encounter: Payer: Self-pay | Admitting: Family Medicine

## 2023-09-01 ENCOUNTER — Other Ambulatory Visit: Payer: Self-pay | Admitting: Family Medicine

## 2023-09-01 ENCOUNTER — Ambulatory Visit: Payer: Self-pay

## 2023-09-01 DIAGNOSIS — J441 Chronic obstructive pulmonary disease with (acute) exacerbation: Secondary | ICD-10-CM

## 2023-09-01 DIAGNOSIS — E1129 Type 2 diabetes mellitus with other diabetic kidney complication: Secondary | ICD-10-CM

## 2023-09-01 DIAGNOSIS — J019 Acute sinusitis, unspecified: Secondary | ICD-10-CM

## 2023-09-01 DIAGNOSIS — J9611 Chronic respiratory failure with hypoxia: Secondary | ICD-10-CM

## 2023-09-01 DIAGNOSIS — Z7984 Long term (current) use of oral hypoglycemic drugs: Secondary | ICD-10-CM | POA: Diagnosis not present

## 2023-09-01 DIAGNOSIS — E1159 Type 2 diabetes mellitus with other circulatory complications: Secondary | ICD-10-CM

## 2023-09-01 DIAGNOSIS — Z9981 Dependence on supplemental oxygen: Secondary | ICD-10-CM | POA: Diagnosis not present

## 2023-09-01 DIAGNOSIS — R809 Proteinuria, unspecified: Secondary | ICD-10-CM | POA: Diagnosis not present

## 2023-09-01 MED ORDER — PREDNISONE 20 MG PO TABS: 40.0000 mg | ORAL_TABLET | Freq: Every day | ORAL | 0 refills | Status: DC

## 2023-09-01 MED ORDER — BENZONATATE 100 MG PO CAPS: 100.0000 mg | ORAL_CAPSULE | Freq: Two times a day (BID) | ORAL | 0 refills | Status: AC | PRN

## 2023-09-01 MED ORDER — AZITHROMYCIN 250 MG PO TABS: ORAL_TABLET | ORAL | 0 refills | Status: AC

## 2023-09-01 NOTE — Progress Notes (Signed)
MyChart Video Visit    Virtual Visit via Video Note   This format is felt to be most appropriate for this patient at this time. Physical exam was limited by quality of the video and audio technology used for the visit.   Patient location: Home Provider location: [facility]  I discussed the limitations of evaluation and management by telemedicine and the availability of in person appointments. The patient expressed understanding and agreed to proceed.  Patient: Billy Wise   DOB: 10-09-1946   77 y.o. Male  MRN: [id number] Visit Date: 09/01/2023  Today's healthcare provider: Sherlyn Hay, DO   No chief complaint on file.  Subjective    HPI HPI   Last text sent1:50 PM [phone]  Last edited by Acey Lav, CMA on 09/01/2023  1:51 PM.    The patient, with a history of COPD and diabetes, presents with symptoms of a sinus congestions that started 1 week and a half ago, sneezing that began Friday and productive cough over the oast several days.   He reports sneezing, dry sinuses, and nasal congestion severe enough to impede breathing through the nose. He has been managing these symptoms with Nasonex, and saline spray, as well as washing his nose with salt water multiple times a day. Despite these efforts, the patient's nasal congestion persists.  The patient also reports a recent onset of coughing, which is productive at times. The discharge from his nose and what he coughs up is described as green.   In addition to these respiratory symptoms, the patient reports sinus pain and a sore throat, which has improved slightly with the use of Sucrets. He denies any difficulty swallowing food or drink.  The patient uses oxygen at night and reports an oxygen saturation of around 90-91% at rest during the video call. He has not been around anyone who is sick and has been using a neti pot-like method to rinse his sinuses.  Regarding his diabetes management,  the patient is on a regimen of Lispro and Lantus insulin, and recently discontinued Ozempic due to anxiety. He reports his blood sugars have been the lowest they've been in a long time.  The patient also mentions he is due to start a new nebulized solution, Ohtuvayre, for his COPD. He currently uses Breztri and occasionally albuterol.    Medications: Outpatient Medications Prior to Visit  Medication Sig   Accu-Chek Softclix Lancets lancets Use to check blood sugar twice a day. DX E11.9   albuterol (PROVENTIL) (2.5 MG/3ML) 0.083% nebulizer solution Take 3 mLs (2.5 mg total) by nebulization every 6 (six) hours as needed for wheezing or shortness of breath.   albuterol (VENTOLIN HFA) 108 (90 Base) MCG/ACT inhaler Inhale 2 puffs into the lungs every 6 (six) hours as needed for wheezing or shortness of breath.   amLODipine (NORVASC) 5 MG tablet Take 1 tablet (5 mg total) by mouth daily.   ascorbic acid (VITAMIN C) 1000 MG tablet Take 1 tablet by mouth daily.   aspirin EC 81 MG tablet Take 1 tablet (81 mg total) by mouth daily. Swallow whole.   Blood Glucose Monitoring Suppl (ACCU-CHEK AVIVA PLUS) w/Device KIT Use to check blood sugar twice a day.  DX E11.9   Budeson-Glycopyrrol-Formoterol (BREZTRI AEROSPHERE) 160-9-4.8 MCG/ACT AERO INHALE 2 PUFFS INTO THE LUNGS IN THE MORNING AND AT BEDTIME   dapagliflozin propanediol (FARXIGA) 10 MG TABS tablet Take 1 tablet (10 mg total) by mouth daily.   finasteride (PROSCAR) 5 MG  tablet TAKE 1 TABLET(5 MG) BY MOUTH DAILY   glucose blood (ACCU-CHEK GUIDE) test strip Use as instructed   insulin glargine (LANTUS SOLOSTAR) 100 UNIT/ML Solostar Pen Inject 50 Units into the skin at bedtime.   insulin lispro (HUMALOG) 100 UNIT/ML KwikPen Junior breakfast 22 units and supper 12 units directly with food   Insulin Pen Needle (PEN NEEDLES) 31G X 5 MM MISC 1 each by Does not apply route daily as needed.   isosorbide mononitrate (IMDUR) 30 MG 24 hr tablet Take 0.5 tablets  (15 mg total) by mouth daily.   Lactobacillus (PROBIOTIC ACIDOPHILUS PO) Take 1 capsule by mouth daily.    metFORMIN (GLUCOPHAGE) 1000 MG tablet TAKE 1 TABLET(1000 MG) BY MOUTH TWICE DAILY   nitroGLYCERIN (NITROSTAT) 0.4 MG SL tablet Place 1 tablet (0.4 mg total) under the tongue every 5 (five) minutes as needed for chest pain.   omeprazole (PRILOSEC) 20 MG capsule Take 1 capsule (20 mg total) by mouth daily.   oxybutynin (DITROPAN-XL) 10 MG 24 hr tablet TAKE 1 TABLET(10 MG) BY MOUTH DAILY   ramipril (ALTACE) 5 MG capsule Take 2 capsules (10 mg total) by mouth daily AND 1 capsule (5 mg total) at bedtime.   rosuvastatin (CRESTOR) 10 MG tablet Take 1 tablet (10 mg total) by mouth daily.   Semaglutide, 1 MG/DOSE, (OZEMPIC, 1 MG/DOSE,) 4 MG/3ML SOPN Inject into the skin. Once per week   tamsulosin (FLOMAX) 0.4 MG CAPS capsule TAKE 2 CAPSULES(0.8 MG) BY MOUTH DAILY   zolpidem (AMBIEN) 10 MG tablet TAKE 1 TABLET(10 MG) BY MOUTH AT BEDTIME   Facility-Administered Medications Prior to Visit  Medication Dose Route Frequency Provider   sodium chloride flush (NS) 0.9 % injection 3 mL  3 mL Intravenous Q12H Gollan, Tollie Pizza, MD    Review of Systems  Constitutional:  Negative for appetite change, chills and fever.  HENT:  Positive for congestion, rhinorrhea, sinus pressure, sinus pain, sneezing and sore throat. Negative for ear discharge, ear pain and trouble swallowing.   Respiratory:  Positive for cough and shortness of breath (with ambulation). Negative for chest tightness and wheezing.   Cardiovascular:  Negative for chest pain and palpitations.  Gastrointestinal:  Negative for abdominal pain, nausea and vomiting.        Objective    There were no vitals taken for this visit.      Physical Exam Constitutional:      General: He is not in acute distress.    Appearance: Normal appearance. He is not diaphoretic.  HENT:     Head: Normocephalic.  Eyes:     Conjunctiva/sclera:  Conjunctivae normal.  Pulmonary:     Effort: Pulmonary effort is normal. No respiratory distress.     Comments: +productive/congested-sounding cough during call Neurological:     Mental Status: He is alert and oriented to person, place, and time. Mental status is at baseline.  Psychiatric:        Mood and Affect: Mood normal.        Behavior: Behavior normal.      Assessment & Plan    COPD with acute exacerbation (HCC) Assessment & Plan: Recent cough with productive sputum (normally doesn't have chronic cough). Oxygen saturation at 90-91% at rest.  -Continue supplemental oxygen at night and as needed during the day. -Continue current COPD management with Breztri and new Otuvaer nebulized solution. -Start Tessalon Perles if cough becomes more bothersome. -Start Prednisone taper pack. -Start Azithromycin therapy. -Continue current regimen of Nasonex, and saline  nasal irrigation for associated sinus symptoms. -If no improvement or worsening symptoms, consider chest x-ray.  Orders: -     predniSONE; Take 2 tablets (40 mg total) by mouth daily with breakfast.  Dispense: 8 tablet; Refill: 0 -     Benzonatate; Take 1 capsule (100 mg total) by mouth 2 (two) times daily as needed for cough.  Dispense: 30 capsule; Refill: 0 -     Azithromycin; Take 2 tablets on day 1, then 1 tablet daily on days 2 through 5  Dispense: 6 tablet; Refill: 0  Chronic hypoxic respiratory failure, on home oxygen therapy (HCC)  Acute sinusitis with symptoms > 10 days  Diabetes mellitus with proteinuria (HCC) Assessment & Plan: Patient on insulin regimen (Lispro and Lantus). Recently stopped Ozempic due to anxiety. Blood sugars have been lower recently. -Advised patient to monitor blood sugars closely while on Prednisone as it can increase blood sugar levels.    Return if symptoms worsen or fail to improve.     I discussed the assessment and treatment plan with the patient. The patient was provided an  opportunity to ask questions and all were answered. The patient agreed with the plan and demonstrated an understanding of the instructions.   The patient was advised to call back or seek an in-person evaluation if the symptoms worsen or if the condition fails to improve as anticipated.  I provided 11 minutes of virtual-face-to-face time during this encounter.   Sherlyn Hay, DO [facility] [phone] (phone) [phone] (fax)  [facility]

## 2023-09-01 NOTE — Assessment & Plan Note (Addendum)
Recent cough with productive sputum (normally doesn't have chronic cough). Oxygen saturation at 90-91% at rest.  -Continue supplemental oxygen at night and as needed during the day. -Continue current COPD management with Breztri and new Otuvaer nebulized solution. -Start Tessalon Perles if cough becomes more bothersome. -Start Prednisone taper pack. -Start Azithromycin therapy. -Continue current regimen of Nasonex, and saline nasal irrigation for associated sinus symptoms. -If no improvement or worsening symptoms, consider chest x-ray.

## 2023-09-01 NOTE — Assessment & Plan Note (Signed)
Patient on insulin regimen (Lispro and Lantus). Recently stopped Ozempic due to anxiety. Blood sugars have been lower recently. -Advised patient to monitor blood sugars closely while on Prednisone as it can increase blood sugar levels.

## 2023-09-01 NOTE — Telephone Encounter (Signed)
essage from Billy Wise sent at 09/01/2023 10:12 AM EST  Summary: requesting an antibiotic for sinus infection   Patient has called and states he has had a sinus infection for 4 or 5 days now that he can not get rid of. Patient states he has tried multiple OTC medications and nothing seems to be helping. Patient is requesting an antibiotic to help get rid of this. Patient states he is very congested & stopped up in his nose and does not want this to go to his chest since he has COPD, no fever.   Please advise and contact patient back @ # [phone]         Chief Complaint: sinus pain and congestion worried it will go into chest . Pt has COPD. Symptoms: feels like sinuses are dry and inflamed. Mouth breathing because nose is blocked. Mouth breathing causes breathing difficulty Frequency: started Sat Pertinent Negatives: Patient denies wheezing or fever, O2 sat taken during cal 93% on room air Disposition: [] ED /[] Urgent Care (no appt availability in office) / [x] Appointment(In office/virtual)/ []  [facility]/ [] Home Care/ [] Refused Recommended Disposition /[] [place] [facility]/ []  Follow-up with PCP Additional Notes: virtual appt made for pt to discuss sx  Reason for Disposition  [1] Sinus congestion (pressure, fullness) AND [2] present > 10 days  Answer Assessment - Initial Assessment Questions 1. LOCATION: "Where does it hurt?"      Deep sinuses and sore throat  2. ONSET: "When did the sinus pain start?"  (e.g., hours, days)      Sat 3. SEVERITY: "How bad is the pain?"   (Scale 1-10; mild, moderate or severe)   - MILD (1-3): doesn't interfere with normal activities    - MODERATE (4-7): interferes with normal activities (e.g., work or school) or awakens from sleep   - SEVERE (8-10): excruciating pain and patient unable to do any normal activities        Moderate to severe due to dryness  5. NASAL CONGESTION: "Is the nose blocked?" If Yes, ask: "Can you open it or  must you breathe through your mouth?"     Nose is blocked  6. NASAL DISCHARGE: "Do you have discharge from your nose?" If so ask, "What color?"     Small amount green 7. FEVER: "Do you have a fever?" If Yes, ask: "What is it, how was it measured, and when did it start?"      no 8. OTHER SYMPTOMS: "Do you have any other symptoms?" (e.g., sore throat, cough, earache, difficulty breathing)     Cough, O2 sat baseline 90-91. Wearing oxygen difficulty breathing 2LPM at night daytime pulse ox 93%(was taken during call)  Protocols used: Sinus Pain or Congestion-A-AH

## 2023-09-02 NOTE — Telephone Encounter (Signed)
Requested Prescriptions  Pending Prescriptions Disp Refills   amLODipine (NORVASC) 5 MG tablet [Pharmacy Med Name: AMLODIPINE BESYLATE 5MG  TABLETS] 90 tablet 0    Sig: TAKE 1 TABLET(5 MG) BY MOUTH DAILY     Cardiovascular: Calcium Channel Blockers 2 Passed - 09/01/2023  6:21 PM      Passed - Last BP in normal range    BP Readings from Last 1 Encounters:  08/19/23 138/78         Passed - Last Heart Rate in normal range    Pulse Readings from Last 1 Encounters:  08/19/23 99         Passed - Valid encounter within last 6 months    Recent Outpatient Visits           Yesterday COPD with acute exacerbation [facility])   [place] [facility] [place], Monico Blitz, DO   2 weeks ago Encounter for annual wellness visit (AWV) in Medicare patient   [place] [facility] [place], Marzella Schlein, MD   1 month ago Anxiety   [place] [facility] [place], Laie, PA-C   6 months ago Diabetes mellitus with proteinuria [facility])   [place] [facility] [place], Marzella Schlein, MD   10 months ago Coronary artery disease of native artery of native heart with stable angina pectoris [facility])   [place] [facility] [place], Marzella Schlein, MD       Future Appointments             In 5 months [place], Marzella Schlein, MD [facility]

## 2023-09-07 ENCOUNTER — Other Ambulatory Visit: Payer: Self-pay | Admitting: Physician Assistant

## 2023-09-07 DIAGNOSIS — F419 Anxiety disorder, unspecified: Secondary | ICD-10-CM

## 2023-09-09 ENCOUNTER — Encounter: Payer: Self-pay | Admitting: Pulmonary Disease

## 2023-09-10 ENCOUNTER — Other Ambulatory Visit: Payer: Self-pay

## 2023-09-10 MED ORDER — AEROCHAMBER PLS FLOVU MTHPIECE DEVI
0 refills | Status: AC
  Filled 2023-09-10: qty 1, 30d supply, fill #0

## 2023-09-10 NOTE — Telephone Encounter (Signed)
OK to send order for aero chamber.

## 2023-09-14 ENCOUNTER — Ambulatory Visit: Payer: Self-pay

## 2023-09-14 NOTE — Telephone Encounter (Signed)
  Chief Complaint: bilateral top of feet burning  Symptoms: severe Frequency: 3 months Pertinent Negatives: Patient denies redness or swelling Disposition: [] ED /[] Urgent Care (no appt availability in office) / [] Appointment(In office/virtual)/ []  [place] [facility]/ [] Home Care/ [x] Refused Recommended Disposition /[] [place] [facility]/ []  Follow-up with PCP Additional Notes: refused UC wanted appt Earliest appt could find is 09/28/23. Pt wanting on waitlist. Wanting to see PCP but will see anyone if it means a sooner appt. Reason for Disposition  [1] SEVERE pain (e.g., excruciating, unable to do any normal activities) AND [2] not improved after 2 hours of pain medicine  Answer Assessment - Initial Assessment Questions 1. ONSET: "When did the pain start?"      3 months 2. LOCATION: "Where is the pain located?"    Top of   Feet burning 3. PAIN: "How bad is the pain?"    (Scale 1-10; or mild, moderate, severe)  - MILD (1-3): doesn't interfere with normal activities.   - MODERATE (4-7): interferes with normal activities (e.g., work or school) or awakens from sleep, limping.   - SEVERE (8-10): excruciating pain, unable to do any normal activities, unable to walk.      Severe  eases slight when off feet.  4. WORK OR EXERCISE: "Has there been any recent work or exercise that involved this part of the body?"      N/a 5. CAUSE: "What do you think is causing the foot pain?"     neuropathy 6. OTHER SYMPTOMS: "Do you have any other symptoms?" (e.g., leg pain, rash, fever, numbness)     Right thigh to knee burning 7. PREGNANCY: "Is there any chance you are pregnant?" "When was your last menstrual period?"     N/a  Protocols used: Foot Pain-A-AH

## 2023-09-15 NOTE — Telephone Encounter (Signed)
Scheduled appt with Caryl Asp for 09/22/2023.  He is also on the wait list for Dr. B if she has any cancellations.

## 2023-09-22 ENCOUNTER — Encounter: Payer: Self-pay | Admitting: Family Medicine

## 2023-09-22 ENCOUNTER — Ambulatory Visit: Payer: Medicare PPO | Admitting: Family Medicine

## 2023-09-22 VITALS — BP 144/87 | HR 84 | Temp 97.9°F | Ht 65.0 in | Wt 177.0 lb

## 2023-09-22 DIAGNOSIS — J329 Chronic sinusitis, unspecified: Secondary | ICD-10-CM | POA: Diagnosis not present

## 2023-09-22 DIAGNOSIS — R200 Anesthesia of skin: Secondary | ICD-10-CM | POA: Insufficient documentation

## 2023-09-22 DIAGNOSIS — R809 Proteinuria, unspecified: Secondary | ICD-10-CM

## 2023-09-22 DIAGNOSIS — E1140 Type 2 diabetes mellitus with diabetic neuropathy, unspecified: Secondary | ICD-10-CM | POA: Insufficient documentation

## 2023-09-22 DIAGNOSIS — E1129 Type 2 diabetes mellitus with other diabetic kidney complication: Secondary | ICD-10-CM

## 2023-09-22 DIAGNOSIS — J321 Chronic frontal sinusitis: Secondary | ICD-10-CM | POA: Diagnosis not present

## 2023-09-22 DIAGNOSIS — F419 Anxiety disorder, unspecified: Secondary | ICD-10-CM | POA: Diagnosis not present

## 2023-09-22 DIAGNOSIS — R202 Paresthesia of skin: Secondary | ICD-10-CM | POA: Diagnosis not present

## 2023-09-22 MED ORDER — GABAPENTIN 100 MG PO CAPS: 100.0000 mg | ORAL_CAPSULE | Freq: Three times a day (TID) | ORAL | 3 refills | Status: DC

## 2023-09-22 MED ORDER — FLUTICASONE PROPIONATE 50 MCG/ACT NA SUSP: 2.0000 | Freq: Every day | NASAL | 6 refills | Status: DC

## 2023-09-22 MED ORDER — HYDROXYZINE HCL 25 MG PO TABS: 25.0000 mg | ORAL_TABLET | Freq: Three times a day (TID) | ORAL | 0 refills | Status: AC

## 2023-09-22 NOTE — Progress Notes (Deleted)
   Acute Office Visit  Introduced to nurse practitioner role and practice setting.  All questions answered.  Discussed provider/patient relationship and expectations.   Subjective:     Patient ID: Billy Wise, male    DOB: 1946-02-21, 77 y.o.   MRN: [id number]  Chief Complaint  Patient presents with   Leg Pain    Patient is having some burning pain in his right leg, the pain is from his hip to his knee and then on top of his feet sometimes. Leg pain has been going for about a year but now it has progressed to a daily issue. The sensation in his feet has been going on for a couple months.    Sinus Problem    Been having some sinus issues since July 13, 2023 and he has already taken 2 rounds of abx but still no relief.     HPI Leg for one year, every single day - feels on burning top of feet.  Sugars better at home Denies sinus pressure ENT -  ROS      Objective:    BP (!) 144/87 (BP Location: Right Arm, Patient Position: Sitting, Cuff Size: Normal)   Pulse 84   Temp 97.9 F (36.6 C)   Ht 5\' 5"  (1.651 m)   Wt 177 lb (80.3 kg)   BMI 29.45 kg/m  {Vitals History (Optional):23777}  Physical Exam  No results found for any visits on 09/22/23.      Assessment & Plan:  ***   No orders of the defined types were placed in this encounter.   No follow-ups on file.  Sallee Provencal, FNP

## 2023-09-22 NOTE — Assessment & Plan Note (Addendum)
Pt's has developed worsening burning bilaterally in lower extremities, worse on R. Started one year ago per pt. Denies changes in strength, weakness, or foot drop. Based on exam, 5/5 strength, no focal weakness, no back, hip or knee pain elicited with maneuvers. Has full ROM. Proprioception intact, diminished sensation in bilateral feet, but able to identify areas of touch. No pallor, normal temperature, pulses intact, no erythema, no edema, no skin breakdown. Reflexes intact.   Denies incontinence or urination changes. No red flags for spinal emergency  Checks feet daily, no falls, no gait changes. Gets annual  diabetic foot exam .  No trauma or injury to spinal or BLE.  Will check A1C, TSH, and B12 - given the numbness and tingling in BLE. Will communicate results.  Given hx of DMII, higher risk of developing diabetic neuropathy - will trial Gabapentin 100mg  po tablet TID. Discussed drug side effects, and starting slow with first 3 days only taking 100mg  once at night, then slow incorporating to twice then three times a day.   If symptoms continue- may need medication adjustment or further work up of origin.

## 2023-09-22 NOTE — Assessment & Plan Note (Signed)
Pt requested refill on Hydroxyzine as needed for intermittent anxiety episodes. Prescribed by PCP, Dr. Beryle Flock.

## 2023-09-22 NOTE — Assessment & Plan Note (Signed)
Pt has had intermittent daily congestion for 3 months. He has been recently treated for COPD exacerbation on 09/01/23.   States saline spray and gentamicin ointment given by ENT helps - but the congestion comes back every 5-6hrs daily. Appears to have developed chronic rhinosinusitis Discussed trial of daily Fluticasone nasal spray to help alleviate symptoms.   Discussed:  First saline spray, wait 15 to 20 minutes, then use Fluticasone, wait 20-30 minutes, and then finally apply the gentamycin ointment prescribed by ENT. Thi swill allow for better absorption of medication.   Discussed following up with ENT on gentamicin ointment - as they prescribed this medication for him to follow up on continued need and their recommendation.  Pt at COPD baseline today - not in distress, SOB, or needing higher level respiratory needs.   CURB-65 - score 1, given age, otherwise, negative for pneumonia screening. antibiotics or chest imaging not needed at this time.

## 2023-09-22 NOTE — Progress Notes (Signed)
Acute Office Visit  Introduced to nurse practitioner role and practice setting.  All questions answered.  Discussed provider/patient relationship and expectations.   Subjective:     Patient ID: Billy Wise, male    DOB: May 07, 1946, 77 y.o.   MRN: [id number]  Chief Complaint  Patient presents with   Leg Pain    Patient is having some burning pain in his right leg, the pain is from his hip to his knee and then on top of his feet sometimes. Leg pain has been going for about a year but now it has progressed to a daily issue. The sensation in his feet has been going on for a couple months.    Sinus Problem    Been having some sinus issues since July 13, 2023 and he has already taken 2 rounds of abx but still no relief.     Pt presents for concerns for worsening burning and tingling on top of bilateral feet. Stated about one year ago numbness/burning on R thigh and hip, which tylenol does not help the pain. States it was only intermittent, but now the symptoms are daily affecting R leg worse than L. The burning and tingling is the same in both anterior feet. Rest does help, but can happen rather he is sitting or standing. Denies back pain, hip, or knee joint pain. States the skin burning pain is the most bothersome. He feels its neuropathy given his hx of DM II and the progression of it. He does not feel it is coming from his back and he denies trauma or injury to back or lower extremities. States both his mother and brother have neuropathy and gabapentin have helped them.   Pt also has concerns today for chronic congestion. States He has had three antibiotics since September, most recent 09/01/23 - Azithromycin. He is has been seen by ENT and Pulmonary. He is concerned for his risk for pneumonia and why he is daily fluctuating nasal sinus congestion. Denies coughing concern - states he might cough 2-3x/day. Denies sinus pressure, ear pain,difficult breathing, shortness of breath, or air  hunger. Feels his COPD is being managed well. He wears oxygen while sleeping, and his O2 is typically 90-91% on room air. States he uses saline spray 3-4x/day which alleviates symptoms for 5-6 hours, but then congestion comes back. ENT gave him gentamicin ointment, which is helping him as well - but he is unsure the general purpose of the antibiotic ointment.   Leg Pain  There was no injury mechanism. The pain is present in the right thigh, right foot, right leg, left foot, left toes and right toes. The quality of the pain is described as burning. The pain is moderate. The pain has been Fluctuating (becoming more constant) since onset. Associated symptoms include numbness and tingling. Associated symptoms comments: Diminished.  Sinus Problem This is a chronic problem. The problem has been waxing and waning since onset. There has been no fever. His pain is at a severity of 0/10. He is experiencing no pain. Associated symptoms include congestion. Pertinent negatives include no chills, coughing, diaphoresis, ear pain, headaches, hoarse voice, neck pain, shortness of breath, sinus pressure, sneezing, sore throat or swollen glands. Past treatments include saline nose sprays and antibiotics. The treatment provided moderate relief.     Review of Systems  Constitutional:  Negative for chills and diaphoresis.  HENT:  Positive for congestion. Negative for ear pain, hoarse voice, sinus pressure, sneezing and sore throat.   Eyes:  Negative  for blurred vision.  Respiratory:  Negative for cough and shortness of breath.   Cardiovascular:  Negative for chest pain, palpitations and leg swelling.  Musculoskeletal:  Negative for neck pain.  Skin:  Negative for itching and rash.  Neurological:  Positive for tingling and numbness. Negative for weakness and headaches.       Objective:    BP (!) 144/87 (BP Location: Right Arm, Patient Position: Sitting, Cuff Size: Normal)   Pulse 84   Temp 97.9 F (36.6 C)   Ht  5\' 5"  (1.651 m)   Wt 177 lb (80.3 kg)   BMI 29.45 kg/m    Physical Exam Constitutional:      Appearance: Normal appearance.  HENT:     Head: Normocephalic.     Nose: Congestion present.     Mouth/Throat:     Mouth: Mucous membranes are moist.     Pharynx: No posterior oropharyngeal erythema.  Eyes:     Extraocular Movements: Extraocular movements intact.     Pupils: Pupils are equal, round, and reactive to light.  Cardiovascular:     Rate and Rhythm: Normal rate and regular rhythm.     Pulses: Normal pulses.          Dorsalis pedis pulses are 2+ on the right side and 2+ on the left side.       Posterior tibial pulses are 2+ on the right side and 2+ on the left side.     Heart sounds: Normal heart sounds.  Pulmonary:     Effort: Pulmonary effort is normal.     Breath sounds: No stridor. No wheezing, rhonchi or rales.     Comments: Clear bilateral breath sounds, but diminished and prolonged at the bases. COPD history - states he tends to have shallow breathing. Not in distress during visit.   Sp02 - 92% Musculoskeletal:     Right hip: Normal.     Left hip: Normal.     Right upper leg: Normal.     Left upper leg: Normal.     Right knee: Normal.     Left knee: Normal.     Right ankle: Normal.     Left ankle: Normal.     Right foot: Normal. Normal range of motion. No foot drop or tenderness.     Left foot: Normal. Normal range of motion. No foot drop or tenderness.  Feet:     Right foot:     Skin integrity: Dry skin present. No ulcer, blister, skin breakdown, erythema or callus.     Toenail Condition: Right toenails are normal.     Left foot:     Skin integrity: Dry skin present. No ulcer, blister, skin breakdown, erythema, callus or fissure.     Toenail Condition: Left toenails are normal.  Neurological:     Mental Status: He is alert and oriented to person, place, and time.     GCS: GCS eye subscore is 4. GCS verbal subscore is 5. GCS motor subscore is 6.     Sensory:  Sensory deficit present.     Motor: No weakness, tremor or abnormal muscle tone.     Coordination: Coordination normal.     Gait: Gait is intact.     Deep Tendon Reflexes:     Reflex Scores:      Patellar reflexes are 2+ on the right side and 2+ on the left side.      Achilles reflexes are 2+ on the right side and 2+ on  the left side.    Comments: Diminished sensation on R thigh to anterior R foot, and L anterior foot. Proprioception intact. Bilaterally - able to identify R v L v Both extremity, able to sense areas on posterior foot.     No results found for any visits on 09/22/23.      Assessment & Plan:   Problem List Items Addressed This Visit       Respiratory   Chronic rhinosinusitis    Pt has had intermittent daily congestion for 3 months. He has been recently treated for COPD exacerbation on 09/01/23.   States saline spray and gentamicin ointment given by ENT helps - but the congestion comes back every 5-6hrs daily. Appears to have developed chronic rhinosinusitis Discussed trial of daily Fluticasone nasal spray to help alleviate symptoms.   Discussed:  First saline spray, wait 15 to 20 minutes, then use Fluticasone, wait 20-30 minutes, and then finally apply the gentamycin ointment prescribed by ENT. Thi swill allow for better absorption of medication.   Discussed following up with ENT on gentamicin ointment - as they prescribed this medication for him to follow up on continued need and their recommendation.  Pt at COPD baseline today - not in distress, SOB, or needing higher level respiratory needs.   CURB-65 - score 1, given age, otherwise, negative for pneumonia screening. antibiotics or chest imaging not needed at this time.       Relevant Medications   fluticasone (FLONASE) 50 MCG/ACT nasal spray     Endocrine   Diabetes mellitus with proteinuria (HCC)   Relevant Orders   Urine Albumin/Creatinine with ratio (send out) [LAB689]   Type 2 diabetes mellitus with  diabetic neuropathy (HCC)   Relevant Medications   gabapentin (NEURONTIN) 100 MG capsule   Other Relevant Orders   HgB A1c   Vitamin B12   Comprehensive Metabolic Panel (CMET)     Other   Anxiety    Pt requested refill on Hydroxyzine as needed for intermittent anxiety episodes. Prescribed by PCP, Dr. Beryle Flock.      Relevant Medications   hydrOXYzine (ATARAX) 25 MG tablet   Numbness and tingling of both feet    Pt's has developed worsening burning/numbess bilaterally in lower extremities, worse on R. Started one year ago per pt. Based on exam, 5/5 strength, no focal weakness, no back, hip or knee pain elicited with maneuvers. Proprioception intact, diminished sensation in bilateral feet, but able to identify areas of touch. No pallor, normal temperature, pulses intact, no erythema, no edema, no skin breakdown. Reflexes intact.   Denies incontinence or urination changes. No red flags for spinal emergency  No trauma or injury to spinal or BLE.  Will check A1C, TSH, and B12 - given the numbness and tingling in BLE. Will communicate results.  Given hx of DMII, higher risk of developing diabetic neuropathy - will trial Gabapentin 100mg  po tablet TID. Discussed drug side effects, and starting slow with first 3 days only taking 100mg  once at night, then slow incorporating to twice then three times a day.   If symptoms continue- may need medication adjustment or further work up of origin.       Relevant Orders   Vitamin B12   TSH + free T4   Other Visit Diagnoses     Sinusitis chronic, frontal    -  Primary   Relevant Medications   fluticasone (FLONASE) 50 MCG/ACT nasal spray        Meds ordered this encounter  Medications   fluticasone (FLONASE) 50 MCG/ACT nasal spray    Sig: Place 2 sprays into both nostrils daily.    Dispense:  16 g    Refill:  6   gabapentin (NEURONTIN) 100 MG capsule    Sig: Take 1 capsule (100 mg total) by mouth 3 (three) times daily.    Dispense:  90  capsule    Refill:  3   hydrOXYzine (ATARAX) 25 MG tablet    Sig: Take 1 tablet (25 mg total) by mouth 3 (three) times daily.    Dispense:  30 tablet    Refill:  0    Return if symptoms worsen or fail to improve.  Will follow up with lab results.  I, Sallee Provencal, FNP, have reviewed all documentation for this visit. The documentation on 09/22/23 for the exam, diagnosis, procedures, and orders are all accurate and complete.   Sallee Provencal, FNP

## 2023-09-23 ENCOUNTER — Encounter: Payer: Self-pay | Admitting: Family Medicine

## 2023-09-24 LAB — COMPREHENSIVE METABOLIC PANEL
ALT: 23 [IU]/L (ref 0–44)
AST: 23 [IU]/L (ref 0–40)
Albumin: 4.2 g/dL (ref 3.8–4.8)
Alkaline Phosphatase: 78 [IU]/L (ref 44–121)
BUN/Creatinine Ratio: 14 (ref 10–24)
BUN: 17 mg/dL (ref 8–27)
Bilirubin Total: 0.3 mg/dL (ref 0.0–1.2)
CO2: 24 mmol/L (ref 20–29)
Calcium: 9.4 mg/dL (ref 8.6–10.2)
Chloride: 100 mmol/L (ref 96–106)
Creatinine, Ser: 1.22 mg/dL (ref 0.76–1.27)
Globulin, Total: 3.3 g/dL (ref 1.5–4.5)
Glucose: 138 mg/dL — ABNORMAL HIGH (ref 70–99)
Potassium: 4.3 mmol/L (ref 3.5–5.2)
Sodium: 139 mmol/L (ref 134–144)
Total Protein: 7.5 g/dL (ref 6.0–8.5)
eGFR: 62 mL/min/{1.73_m2} (ref >59)

## 2023-09-24 LAB — TSH+FREE T4
Free T4: 1.06 ng/dL (ref 0.82–1.77)
TSH: 1.69 u[IU]/mL (ref 0.450–4.500)

## 2023-09-24 LAB — MICROALBUMIN / CREATININE URINE RATIO
Creatinine, Urine: 33 mg/dL
Microalb/Creat Ratio: 1177 mg/g{creat} — ABNORMAL HIGH (ref 0–29)
Microalbumin, Urine: 388.5 ug/mL

## 2023-09-24 LAB — HEMOGLOBIN A1C
Est. average glucose Bld gHb Est-mCnc: 140 mg/dL
Hgb A1c MFr Bld: 6.5 % — ABNORMAL HIGH (ref 4.8–5.6)

## 2023-09-24 LAB — VITAMIN B12: Vitamin B-12: >2000 pg/mL — ABNORMAL HIGH (ref 232–1245)

## 2023-09-24 NOTE — Progress Notes (Signed)
A1C - 6.5 - which is at goal for diabetes of less than 7% Vitamin B12 elevated - pt takes daily supplements - please decrease weekly intake. CMP - glucose elevated, but all other values normal range. Urine ACR - Creatinine ratio elevated.- monitor BP, blood glucose, increase water intake, and monitor daily protein intake in moderation to help reduce this number.  TSH - normal values

## 2023-09-24 NOTE — Telephone Encounter (Signed)
Question regarding your labs from his visit.  For what it is worth, I usually decrease their dose of B1 supplement (maybe every other day) when it is elevated.

## 2023-09-28 ENCOUNTER — Ambulatory Visit: Payer: Medicare PPO | Admitting: Family Medicine

## 2023-09-30 ENCOUNTER — Other Ambulatory Visit: Payer: Self-pay | Admitting: Family Medicine

## 2023-09-30 DIAGNOSIS — E1159 Type 2 diabetes mellitus with other circulatory complications: Secondary | ICD-10-CM

## 2023-09-30 NOTE — Telephone Encounter (Signed)
Requested Prescriptions  Pending Prescriptions Disp Refills   ramipril (ALTACE) 5 MG capsule [Pharmacy Med Name: RAMIPRIL 5MG  CAPSULES] 270 capsule 1    Sig: TAKE 2 CAPSULES BY MOUTH EVERY MORNING AND 1 CAPSULE EVERY NIGHT AT BEDTIME     Cardiovascular:  ACE Inhibitors Failed - 09/30/2023  2:04 PM      Failed - Last BP in normal range    BP Readings from Last 1 Encounters:  09/22/23 (!) 144/87         Passed - Cr in normal range and within 180 days    Creatinine  Date Value Ref Range Status  03/29/2014 2.20 (H) 0.60 - 1.30 mg/dL Final   Creatinine, Ser  Date Value Ref Range Status  09/22/2023 1.22 0.76 - 1.27 mg/dL Final   Creatinine, Urine  Date Value Ref Range Status  10/22/2022 88.4  Final         Passed - K in normal range and within 180 days    Potassium  Date Value Ref Range Status  09/22/2023 4.3 3.5 - 5.2 mmol/L Final  03/29/2014 3.5 3.5 - 5.1 mmol/L Final         Passed - Patient is not pregnant      Passed - Valid encounter within last 6 months    Recent Outpatient Visits           1 week ago Sinusitis chronic, frontal   [place] [facility] [place], Caryl Asp A, FNP   4 weeks ago COPD with acute exacerbation [facility])   [place] [facility] [place], Monico Blitz, DO   1 month ago Encounter for annual wellness visit (AWV) in Medicare patient   [place] [facility] [place], Marzella Schlein, MD   2 months ago Anxiety   [place] [facility] [place], [place], PA-C   7 months ago Diabetes mellitus with proteinuria [facility])   [place] [facility] [place], Marzella Schlein, MD       Future Appointments             In 4 months Bacigalupo, Marzella Schlein, MD [facility], [facility]            Refused Prescriptions Disp Refills   ramipril (ALTACE) 10 MG capsule [Pharmacy Med Name: RAMIPRIL 10MG  CAPSULES] 90 capsule 1    Sig: TAKE 1 CAPSULE(10 MG) BY MOUTH DAILY      Cardiovascular:  ACE Inhibitors Failed - 09/30/2023  2:04 PM      Failed - Last BP in normal range    BP Readings from Last 1 Encounters:  09/22/23 (!) 144/87         Passed - Cr in normal range and within 180 days    Creatinine  Date Value Ref Range Status  03/29/2014 2.20 (H) 0.60 - 1.30 mg/dL Final   Creatinine, Ser  Date Value Ref Range Status  09/22/2023 1.22 0.76 - 1.27 mg/dL Final   Creatinine, Urine  Date Value Ref Range Status  10/22/2022 88.4  Final         Passed - K in normal range and within 180 days    Potassium  Date Value Ref Range Status  09/22/2023 4.3 3.5 - 5.2 mmol/L Final  03/29/2014 3.5 3.5 - 5.1 mmol/L Final         Passed - Patient is not pregnant      Passed - Valid encounter within last 6 months    Recent Outpatient Visits  1 week ago Sinusitis chronic, frontal   [place] [facility] [place], [place] A, FNP   4 weeks ago COPD with acute exacerbation [facility])   [place] [facility] [place], Monico Blitz, DO   1 month ago Encounter for annual wellness visit (AWV) in Medicare patient   [facility] [place], Marzella Schlein, MD   2 months ago Anxiety   [place] [facility] [place], [place], PA-C   7 months ago Diabetes mellitus with proteinuria [facility])   [place] [facility] Bac[place], Marzella Schlein, MD       Future Appointments             In 4 months Bacigalupo, Marzella Schlein, MD [facility], [facility]

## 2023-10-06 ENCOUNTER — Other Ambulatory Visit: Payer: Self-pay | Admitting: Family Medicine

## 2023-10-06 MED ORDER — OHTUVAYRE 3 MG/2.5ML IN SUSP: 2.5000 mL | Freq: Two times a day (BID) | RESPIRATORY_TRACT | 1 refills | Status: DC

## 2023-10-06 NOTE — Telephone Encounter (Signed)
Per [facility], Ohtuvayre shipped to patient on 10/05/2023.  Rx sent to [facility].  Chesley Mires, PharmD, MPH, BCPS, CPP Clinical Pharmacist (Rheumatology and Pulmonology)

## 2023-10-06 NOTE — Addendum Note (Signed)
Addended by: Murrell Redden on: 10/06/2023 02:00 PM   Modules accepted: Orders

## 2023-10-07 NOTE — Telephone Encounter (Signed)
Requested medications are due for refill today.  A little too soon  Requested medications are on the active medications list.  yes  Last refill. 04/16/2023 #90 1 rf  Future visit scheduled.   yes  Notes to clinic.  Refill not delegated.    Requested Prescriptions  Pending Prescriptions Disp Refills   zolpidem (AMBIEN) 10 MG tablet [Pharmacy Med Name: ZOLPIDEM 10MG  TABLETS] 90 tablet     Sig: TAKE 1 TABLET(10 MG) BY MOUTH AT BEDTIME     Not Delegated - Psychiatry:  Anxiolytics/Hypnotics Failed - 10/07/2023  7:54 AM      Failed - This refill cannot be delegated      Failed - Urine Drug Screen completed in last 360 days      Passed - Valid encounter within last 6 months    Recent Outpatient Visits           2 weeks ago Sinusitis chronic, frontal   [place] [facility] [place], Caryl Asp A, FNP   1 month ago COPD with acute exacerbation [facility])   [place] [facility] [place], Monico Blitz, DO   1 month ago Encounter for annual wellness visit (AWV) in Medicare patient   [facility] [place], Marzella Schlein, MD   2 months ago Anxiety   [place] [facility] [place], [place], PA-C   7 months ago Diabetes mellitus with proteinuria [facility])   [place] [facility] Bacig[place], Marzella Schlein, MD       Future Appointments             In 4 months Bacigalupo, Marzella Schlein, MD [facility], [facility]

## 2023-10-27 ENCOUNTER — Ambulatory Visit: Payer: Medicare PPO | Admitting: Pulmonary Disease

## 2023-10-27 ENCOUNTER — Encounter: Payer: Self-pay | Admitting: Pulmonary Disease

## 2023-10-27 VITALS — BP 120/72 | HR 74 | Temp 96.6°F | Ht 65.0 in | Wt 170.6 lb

## 2023-10-27 DIAGNOSIS — Z87891 Personal history of nicotine dependence: Secondary | ICD-10-CM | POA: Diagnosis not present

## 2023-10-27 DIAGNOSIS — J9611 Chronic respiratory failure with hypoxia: Secondary | ICD-10-CM

## 2023-10-27 DIAGNOSIS — J449 Chronic obstructive pulmonary disease, unspecified: Secondary | ICD-10-CM | POA: Diagnosis not present

## 2023-10-27 DIAGNOSIS — R0602 Shortness of breath: Secondary | ICD-10-CM

## 2023-10-27 NOTE — Patient Instructions (Signed)
 VISIT SUMMARY:  Jene Oravec visited for a follow-up regarding his COPD and chronic hypoxic respiratory failure. He has noticed an improvement in his breathing since starting Otovel, although he has experienced increased coughing and mucus production. His oxygen  saturation levels have improved, and he continues to use his prescribed medications and oxygen  therapy.  YOUR PLAN:  -CHRONIC OBSTRUCTIVE PULMONARY DISEASE (COPD) WITH CHRONIC HYPOXIC RESPIRATORY FAILURE: COPD is a chronic lung condition that makes it hard to breathe, and chronic hypoxic respiratory failure means the lungs are not getting enough oxygen  into the blood. You have reported better breathing since starting Otovel, but with increased coughing and mucus production. Continue taking Otovel, Breztri , and Ocufen as prescribed. Use albuterol  via nebulizer as needed to help clear mucus. Keep using oxygen  at night and during the day when necessary. Your treatment aims to improve lung function and increase your ability to perform daily activities. This treatment will be lifelong.   INSTRUCTIONS:  Please follow up in three months for your next appointment.  Call sooner should any new problems arise.

## 2023-10-27 NOTE — Progress Notes (Signed)
 Subjective:    Patient ID: Billy Wise, male    DOB: 1946/09/10, 78 y.o.   MRN: [id number]  Patient Care Team: Myrla Jon HERO, MD as PCP - General (Family Medicine) Rumalda Nancyann Bosworth, MD as Referring Physician (Endocrinology) Lucienne Tinnie KATHEE DELENA, MD as Referring Physician (Dermatology) Mittie Gaskin, MD as Referring Physician (Ophthalmology) Sandria Selma DELENA, [facility] (Inactive) as Pharmacist (Pharmacist)  Chief Complaint  Patient presents with   Follow-up    Better since starting Ohtuvayre . DOE. No wheezing. Increased cough with white sputum.    BACKGROUND/INTERVAL:Mr. Dunaj is a 78 year old former smoker who follows here for the issue of COPD with chronic hypoxic respiratory failure due to the same.  He was last seen on a follow-up visit on 19 August 2023.  He was started on Ohtuvayre  at that visit.  He has participated in pulmonary rehab previously.   HPI Discussed the use of AI scribe software for clinical note transcription with the patient, who gave verbal consent to proceed.  History of Present Illness   [name], a patient with a history of COPD and chronic hypoxic respiratory failure, presents for a follow-up visit after starting Ohtuvayre . He reports a noticeable improvement in his breathing since starting the new medication. However, he also notes an increase in coughing and mucus production, which is predominantly clear and white, but occasionally colored green.  This mostly occurs after the nebulization treatment.  Occasionally he may have sporadic coughing episodes that occur in spells during the day, often associated with efforts to expel mucus.  Once he expels the mucus coughing is relieved.  He has been adhering to his regimen of Breztri  and Ohtuvayre , with the latter treatment followed by a 15-minute wait after taking Breztri . He has not been using albuterol  nebulizer.  Regarding his oxygen  use, he consistently uses two liters at night and  during periods of exertion, such as walking or driving. He notes that his oxygen  saturation levels typically range from 90-92% at rest, but has observed an increase to 97-98% since starting the new treatment. He attributes this improvement to the new medication's effect on his lung function.      DATA: CBC 10/28/17>>eosinophils equals 0. Sleep Study 11/27/17>> no sleep apnea.  Nocturnal desaturations noted. Desat walk 11/15/17>> baseline sat on RA at rest was 91% and HR 89. Walked 360 feet at a very brisk pace, was conversational. Sat was 88% and HR 112.  Moderate dyspnea. CT chest personally reviewed>> CT hi-res  02/01/18; chest x-ray 10/28/17; there is mild basilar fibrosis, bronchiectatic changes with bronchial thickening, severe apical emphysema. Findings not likely due to UIP.  PFT tracings personally reviewed from 02/01/18>> FVC is 86% predicted, FEV1 is 67% predicted, there is no improvement with bronchodilator.  Ratio 61%. DLCO is reduced at 52%.Flow volume loop is obstructive. Ambulatory oximetry 07/19/2020: Desaturations with exercise, initiated oxygen  at 2 L/min 2D echocardiogram 07/26/2020: LVEF 60 to 65%, grade 1 DD, moderate elevation on pulmonary artery systolic pressure. PFTs 07/31/2020: FEV1 1.57 L or 57% predicted, FVC 2.79 L or 74% predicted.  FEV1/FVC 56%.  Postbronchodilator there is a 10% change in FEV1 lung volumes showed mild air trapping no hyperinflation, diffusion capacity moderately reduced.  Consistent with COPD/emphysema.  Flow volume loop is delayed consistent with obstructive lung disease. Alpha 1 antitrypsin 12/15/2022: MM phenotype, level 128 mg/dL PFTs 93/81/7975: FEV1 8.07 L or 51% predicted, FVC 2.55 L or 73% predicted, FEV1/FVC 51%, no significant bronchodilator response.  Lung volumes show air trapping.  Flow volume loop is consistent with obstruction.  Diffusion capacity is severely impaired.  This is consistent with COPD on the basis of emphysema.  As the prior study of  31 July 2020 there has been minimal decline in function. CT chest low-dose 05/04/2023: Lung RADS 2 benign appearance and behavior.  Bilateral lower lobe cylindrical bronchiectasis.  Moderate emphysema.  Prior pulmonary nodules resolve indicating inflammatory/infectious cause. Chest x-ray PA and lateral 07/13/2023: No active cardiopulmonary disease.  Review of Systems A 10 point review of systems was performed and it is as noted above otherwise negative.   Patient Active Problem List   Diagnosis Date Noted   Numbness and tingling of both feet 09/22/2023   Chronic rhinosinusitis 09/22/2023   Type 2 diabetes mellitus with diabetic neuropathy (HCC) 09/22/2023   Anxiety 07/31/2023   Accelerating angina (HCC) 12/29/2022   Coronary artery disease of native artery of native heart with stable angina pectoris (HCC) 11/05/2022   Aortic atherosclerosis (HCC) 11/05/2022   Senile purpura (HCC) 08/14/2022   Diabetes mellitus with proteinuria (HCC) 08/14/2022   Chronic hypoxic respiratory failure, on home oxygen  therapy (HCC) 08/07/2021   Venous stasis 08/07/2021   Other secondary pulmonary hypertension (HCC) 07/26/2020   Diastolic dysfunction 07/26/2020   Obesity 10/25/2019   History of anemia 04/20/2019   Gastroesophageal reflux disease without esophagitis 09/09/2018   Hyperlipidemia associated with type 2 diabetes mellitus (HCC) 09/09/2018   COPD (chronic obstructive pulmonary disease) (HCC) 10/28/2017   Hypertension associated with diabetes (HCC) 10/28/2017   Insomnia 09/03/2016   BPH with obstruction/lower urinary tract symptoms 12/16/2015    Social History   Tobacco Use   Smoking status: Former    Current packs/day: 0.00    Average packs/day: 1.5 packs/day for 50.0 years (75.0 ttl pk-yrs)    Types: Cigarettes    Start date: 09/29/1958    Quit date: 09/29/2008    Years since quitting: 15.0   Smokeless tobacco: Never  Substance Use Topics   Alcohol use: Not Currently    Comment: 2  drinks a month / cocktail    No Known Allergies  Current Meds  Medication Sig   Accu-Chek Softclix Lancets lancets Use to check blood sugar twice a day. DX E11.9   albuterol  (VENTOLIN  HFA) 108 (90 Base) MCG/ACT inhaler Inhale 2 puffs into the lungs every 6 (six) hours as needed for wheezing or shortness of breath.   amLODipine  (NORVASC ) 5 MG tablet TAKE 1 TABLET(5 MG) BY MOUTH DAILY   ascorbic acid (VITAMIN C) 1000 MG tablet Take 1 tablet by mouth daily.   aspirin  EC 81 MG tablet Take 1 tablet (81 mg total) by mouth daily. Swallow whole.   benzonatate  (TESSALON ) 100 MG capsule Take 1 capsule (100 mg total) by mouth 2 (two) times daily as needed for cough.   Blood Glucose Monitoring Suppl (ACCU-CHEK AVIVA PLUS) w/Device KIT Use to check blood sugar twice a day.  DX E11.9   Budeson-Glycopyrrol-Formoterol  (BREZTRI  AEROSPHERE) 160-9-4.8 MCG/ACT AERO INHALE 2 PUFFS INTO THE LUNGS IN THE MORNING AND AT BEDTIME   dapagliflozin  propanediol (FARXIGA ) 10 MG TABS tablet Take 1 tablet (10 mg total) by mouth daily.   Ensifentrine  (OHTUVAYRE ) 3 MG/2.5ML SUSP Inhale 2.5 mLs into the lungs in the morning and at bedtime. **[facility]**   finasteride  (PROSCAR ) 5 MG tablet TAKE 1 TABLET(5 MG) BY MOUTH DAILY   fluticasone  (FLONASE ) 50 MCG/ACT nasal spray Place 2 sprays into both nostrils daily.   gabapentin  (NEURONTIN )  100 MG capsule Take 1 capsule (100 mg total) by mouth 3 (three) times daily.   gentamicin ointment (GARAMYCIN) 0.1 % Apply 1 Application topically 3 (three) times daily.   glucose blood (ACCU-CHEK GUIDE) test strip Use as instructed   hydrOXYzine  (ATARAX ) 25 MG tablet Take 1 tablet (25 mg total) by mouth 3 (three) times daily.   insulin  glargine (LANTUS  SOLOSTAR) 100 UNIT/ML Solostar Pen Inject 50 Units into the skin at bedtime.   insulin  lispro (HUMALOG) 100 UNIT/ML KwikPen Junior 12 units at breakfast, 10 units at lunch and 12 units at supper time    Insulin  Pen Needle (PEN NEEDLES) 31G X 5 MM MISC 1 each by Does not apply route daily as needed.   isosorbide  mononitrate (IMDUR ) 30 MG 24 hr tablet Take 0.5 tablets (15 mg total) by mouth daily.   Lactobacillus (PROBIOTIC ACIDOPHILUS PO) Take 1 capsule by mouth daily.    metFORMIN  (GLUCOPHAGE ) 1000 MG tablet TAKE 1 TABLET(1000 MG) BY MOUTH TWICE DAILY   nitroGLYCERIN  (NITROSTAT ) 0.4 MG SL tablet Place 1 tablet (0.4 mg total) under the tongue every 5 (five) minutes as needed for chest pain.   omeprazole  (PRILOSEC) 20 MG capsule Take 1 capsule (20 mg total) by mouth daily.   oxybutynin  (DITROPAN -XL) 10 MG 24 hr tablet TAKE 1 TABLET(10 MG) BY MOUTH DAILY   ramipril  (ALTACE ) 5 MG capsule TAKE 2 CAPSULES BY MOUTH EVERY MORNING AND 1 CAPSULE EVERY NIGHT AT BEDTIME   rosuvastatin  (CRESTOR ) 10 MG tablet Take 1 tablet (10 mg total) by mouth daily.   Spacer/Aero-Holding Chambers (AEROCHAMBER PLS FLOVU MTHPIECE) DEVI Use as directed   tamsulosin  (FLOMAX ) 0.4 MG CAPS capsule TAKE 2 CAPSULES(0.8 MG) BY MOUTH DAILY   zolpidem  (AMBIEN ) 10 MG tablet TAKE 1 TABLET(10 MG) BY MOUTH AT BEDTIME    Immunization History  Administered Date(s) Administered   Fluad Quad(high Dose 65+) 08/05/2020, 08/02/2022, 08/02/2022   Influenza, High Dose Seasonal PF 07/24/2015, 07/21/2016, 08/02/2017, 08/06/2017, 07/27/2018   Influenza,inj,Quad PF,6+ Mos 06/27/2019   Influenza-Unspecified 09/14/2012, 07/19/2021, 08/08/2023   Moderna SARS-COV2 Booster Vaccination 07/06/2021   PFIZER(Purple Top)SARS-COV-2 Vaccination 11/10/2019, 12/01/2019, 07/14/2020   Pfizer Covid-19 Vaccine Bivalent Booster 63yrs & up 01/26/2021   Pfizer(Comirnaty)Fall Seasonal Vaccine 12 years and older 08/02/2022   Pneumococcal Conjugate-13 08/25/2014   Pneumococcal Polysaccharide-23 01/26/2016   Pneumococcal-Unspecified 12/17/2005   Rsv, Bivalent, Protein Subunit Rsvpref,pf [place]) 09/05/2022   Tdap 12/17/2005, 02/25/2016   Zoster Recombinant(Shingrix)  08/23/2021, 06/05/2022   Zoster, Live 11/24/2013        Objective:     BP 120/72 (BP Location: Right Arm, Cuff Size: Normal)   Pulse 74   Temp (!) 96.6 F (35.9 C)   Ht 5' 5 (1.651 m)   Wt 170 lb 9.6 oz (77.4 kg)   SpO2 94%   BMI 28.39 kg/m   SpO2: 94 % O2 Device: None (Room air)  GENERAL: Awake, alert, no respiratory distress.  Well-appearing, fully ambulatory. HEAD: Normocephalic, atraumatic.  EYES: Pupils equal, round, reactive to light.  No scleral icterus.  MOUTH: Oral mucosa moist.  Dentition intact. NECK: Supple. No thyromegaly. Trachea midline. No JVD.  No adenopathy. PULMONARY: Good air entry bilaterally.  Faint dry crackles at bases (chronic), no other adventitious sounds. CARDIOVASCULAR: S1 and S2.  No rubs, murmurs or gallops heard. ABDOMEN: Protuberant, truncal obesity, otherwise benign. MUSCULOSKELETAL: No joint deformity, no clubbing, trace edema.  NEUROLOGIC: No deficits, no gait disturbance, speech is fluent. SKIN: Intact,warm,dry.  Bilateral stasis changes lower extremities. PSYCH: Mood  and behavior normal.   Assessment & Plan:     ICD-10-CM   1. COPD, moderate (HCC) Moderate/Severe  J44.9     2. Chronic respiratory failure with hypoxia (HCC)  J96.11     3. SOB (shortness of breath)  R06.02     4. Former smoker  Z87.891      Discussion:    Chronic Obstructive Pulmonary Disease (COPD) with Chronic Hypoxic Respiratory Failure Patient reports improved breathing since starting Otovel, though experiencing increased coughing and clear/white mucus production, likely due to medication clearing inflammation. Uses Breztri , Ocufen, and albuterol  via nebulizer as needed for mucus clearance. Uses oxygen  at night and occasionally during the day, with improved oxygen  saturation levels. Treatment aims to improve lung function and increase functional capacity. Informed that treatment will be lifelong. - Continue Ohtuvayre  - Continue Breztri  - Use albuterol  via  nebulizer as needed for mucus clearance - Continue using oxygen  at night and as needed during the day - Follow up in three months.       Advised if symptoms do not improve or worsen, to please contact office for sooner follow up or seek emergency care.    I spent 30 minutes of dedicated to the care of this patient on the date of this encounter to include pre-visit review of records, face-to-face time with the patient discussing conditions above, post visit ordering of testing, clinical documentation with the electronic health record, making appropriate referrals as documented, and communicating necessary findings to members of the patients care team.     C. Leita Sanders, MD Advanced Bronchoscopy PCCM [facility]    *This note was generated using voice recognition software/Dragon and/or AI transcription program.  Despite best efforts to proofread, errors can occur which can change the meaning. Any transcriptional errors that result from this process are unintentional and may not be fully corrected at the time of dictation.

## 2023-11-11 ENCOUNTER — Other Ambulatory Visit: Payer: Self-pay | Admitting: Family Medicine

## 2023-11-11 NOTE — Telephone Encounter (Signed)
Requested Prescriptions  Pending Prescriptions Disp Refills   omeprazole (PRILOSEC) 20 MG capsule [Pharmacy Med Name: OMEPRAZOLE 20MG  CAPSULES] 90 capsule 1    Sig: TAKE 1 CAPSULE(20 MG) BY MOUTH DAILY     Gastroenterology: Proton Pump Inhibitors Passed - 11/11/2023 11:17 AM      Passed - Valid encounter within last 12 months    Recent Outpatient Visits           1 month ago Sinusitis chronic, frontal   [place] [facility] [place], Caryl Asp A, FNP   2 months ago COPD with acute exacerbation [facility])   [place] [facility] [place], Monico Blitz, DO   2 months ago Encounter for annual wellness visit (AWV) in Medicare patient   [place] [facility] [place], Marzella Schlein, MD   3 months ago Anxiety   [place] [facility] [place], Ler[place], PA-C   8 months ago Diabetes mellitus with proteinuria [facility])   [place] [facility] [place], Marzella Schlein, MD       Future Appointments             In 2 months Salena Saner, MD [place] [facility] [place]   In 3 months [place], Marzella Schlein, MD [facility], [facility]             oxybutynin (DITROPAN-XL) 10 MG 24 hr tablet [Pharmacy Med Name: OXYBUTYNIN ER 10MG  TABLETS] 90 tablet 0    Sig: TAKE 1 TABLET(10 MG) BY MOUTH DAILY     Urology:  Bladder Agents Passed - 11/11/2023 11:17 AM      Passed - Valid encounter within last 12 months    Recent Outpatient Visits           1 month ago Sinusitis chronic, frontal   [place] [facility] [place], Caryl Asp A, FNP   2 months ago COPD with acute exacerbation [facility])   [place] [facility] [place], Monico Blitz, DO   2 months ago Encounter for annual wellness visit (AWV) in Medicare patient   [facility] [place], Marzella Schlein, MD   3 months ago Anxiety   [place] [facility] [place], [place], PA-C   8 months ago  Diabetes mellitus with proteinuria [facility])    [facility] [place], Marzella Schlein, MD       Future Appointments             In 2 months Salena Saner, MD [facility] Care at [place]   In 3 months [place], Marzella Schlein, MD [facility], [facility]

## 2023-11-15 ENCOUNTER — Other Ambulatory Visit: Payer: Self-pay | Admitting: Family Medicine

## 2023-11-15 DIAGNOSIS — I152 Hypertension secondary to endocrine disorders: Secondary | ICD-10-CM

## 2023-11-17 NOTE — Telephone Encounter (Signed)
Requested Prescriptions  Pending Prescriptions Disp Refills   finasteride (PROSCAR) 5 MG tablet [Pharmacy Med Name: FINASTERIDE 5MG  TABLETS] 90 tablet 0    Sig: TAKE 1 TABLET(5 MG) BY MOUTH DAILY     Urology: 5-alpha Reductase Inhibitors Failed - 11/17/2023 11:17 AM      Failed - PSA in normal range and within 360 days    PSA  Date Value Ref Range Status  04/06/2018 0.6  Final   Prostate Specific Ag, Serum  Date Value Ref Range Status  08/14/2022 0.3 0.0 - 4.0 ng/mL Final    Comment:    Roche ECLIA methodology. According to the American Urological Association, Serum PSA should decrease and remain at undetectable levels after radical prostatectomy. The AUA defines biochemical recurrence as an initial PSA value 0.2 ng/mL or greater followed by a subsequent confirmatory PSA value 0.2 ng/mL or greater. Values obtained with different assay methods or kits cannot be used interchangeably. Results cannot be interpreted as absolute evidence of the presence or absence of malignant disease.          Passed - Valid encounter within last 12 months    Recent Outpatient Visits           1 month ago Sinusitis chronic, frontal   [place] [facility], [place] A, FNP   2 months ago COPD with acute exacerbation [facility])   [place] [facility] [place], Monico Blitz, DO   3 months ago Encounter for annual wellness visit (AWV) in Medicare patient   [place] [facility] [place], Marzella Schlein, MD   3 months ago Anxiety   [place] [facility] [place], [place], PA-C   9 months ago Diabetes mellitus with proteinuria [place])   [place] [facility] [place], Marzella Schlein, MD       Future Appointments             In 2 months Salena Saner, MD [facility] [place] [facility] at [place]   In 3 months [place], Marzella Schlein, MD [facility], [facility]             metFORMIN (GLUCOPHAGE)  1000 MG tablet [Pharmacy Med Name: METFORMIN 1000MG  TABLETS] 180 tablet 0    Sig: TAKE 1 TABLET(1000 MG) BY MOUTH TWICE DAILY     Endocrinology:  Diabetes - Biguanides Failed - 11/17/2023 11:17 AM      Failed - B12 Level in normal range and within 720 days    Vitamin B-12  Date Value Ref Range Status  09/22/2023 >2000 (H) 232 - 1245 pg/mL Final         Failed - CBC within normal limits and completed in the last 12 months    WBC  Date Value Ref Range Status  12/22/2022 7.1 4.0 - 10.5 K/uL Final   RBC  Date Value Ref Range Status  12/22/2022 5.45 4.22 - 5.81 MIL/uL Final   Hemoglobin  Date Value Ref Range Status  12/29/2022 13.9 13.0 - 17.0 g/dL Final  40/98/1191 47.8 13.0 - 17.7 g/dL Final   HCT  Date Value Ref Range Status  12/29/2022 41.0 39.0 - 52.0 % Final   Hematocrit  Date Value Ref Range Status  08/14/2022 43.2 37.5 - 51.0 % Final   MCHC  Date Value Ref Range Status  12/22/2022 32.4 30.0 - 36.0 g/dL Final   [facility]  Date Value Ref Range Status  12/22/2022 26.8 26.0 - 34.0 pg Final   MCV  Date Value Ref Range Status  12/22/2022 82.6 80.0 - 100.0 fL Final  08/14/2022 82 79 - 97 fL Final  03/29/2014 84 80 - 100 fL Final   No results found for: "PLTCOUNTKUC", "LABPLAT", "POCPLA" RDW  Date Value Ref Range Status  12/22/2022 14.9 11.5 - 15.5 % Final  08/14/2022 14.3 11.6 - 15.4 % Final  03/29/2014 14.2 11.5 - 14.5 % Final         Passed - Cr in normal range and within 360 days    Creatinine  Date Value Ref Range Status  03/29/2014 2.20 (H) 0.60 - 1.30 mg/dL Final   Creatinine, Ser  Date Value Ref Range Status  09/22/2023 1.22 0.76 - 1.27 mg/dL Final   Creatinine, Urine  Date Value Ref Range Status  10/22/2022 88.4  Final         Passed - HBA1C is between 0 and 7.9 and within 180 days    Hemoglobin A1C  Date Value Ref Range Status  06/24/2023 7.0  Final   Hgb A1c MFr Bld  Date Value Ref Range Status  09/22/2023 6.5 (H) 4.8 - 5.6 % Final     Comment:             Prediabetes: 5.7 - 6.4          Diabetes: >6.4          Glycemic control for adults with diabetes: <7.0          Passed - eGFR in normal range and within 360 days    EGFR (African American)  Date Value Ref Range Status  03/29/2014 35 (L)  Final   GFR calc Af Amer  Date Value Ref Range Status  08/05/2020 93 >59 mL/min/1.73 Final    Comment:    **In accordance with recommendations from the NKF-ASN Task force,**   [facility] is in the process of updating its eGFR calculation to the   2021 CKD-EPI creatinine equation that estimates kidney function   without a race variable.    EGFR (Non-African Amer.)  Date Value Ref Range Status  03/29/2014 30 (L)  Final    Comment:    eGFR values <66mL/min/1.73 m2 may be an indication of chronic kidney disease (CKD). Calculated eGFR is useful in patients with stable renal function. The eGFR calculation will not be reliable in acutely ill patients when serum creatinine is changing rapidly. It is not useful in  patients on dialysis. The eGFR calculation may not be applicable to patients at the low and high extremes of body sizes, pregnant women, and vegetarians.    GFR, Estimated  Date Value Ref Range Status  12/22/2022 >60 >60 mL/min Final    Comment:    (NOTE) Calculated using the CKD-EPI Creatinine Equation (2021)    eGFR  Date Value Ref Range Status  09/22/2023 62 >59 mL/min/1.73 Final         Passed - Valid encounter within last 6 months    Recent Outpatient Visits           1 month ago Sinusitis chronic, frontal   [place] [facility] [place], Caryl Asp A, FNP   2 months ago COPD with acute exacerbation [facility])    [facility] [place], Monico Blitz, DO   3 months ago Encounter for annual wellness visit (AWV) in Medicare patient   [facility] [place], Marzella Schlein, MD   3 months ago Anxiety   [facility] [place], [place],  PA-C   9 months ago Diabetes mellitus with proteinuria [facility])   [place] [facility] [place], Marzella Schlein, MD       Future Appointments             In 2 months Salena Saner, MD [facility] [place] [facility] at [place]   In 3 months [place], Marzella Schlein, MD [facility], [facility]             amLODipine (NORVASC) 5 MG tablet [Pharmacy Med Name: AMLODIPINE BESYLATE 5MG  TABLETS] 90 tablet 0    Sig: TAKE 1 TABLET(5 MG) BY MOUTH DAILY     Cardiovascular: Calcium Channel Blockers 2 Passed - 11/17/2023 11:17 AM      Passed - Last BP in normal range    BP Readings from Last 1 Encounters:  10/27/23 120/72         Passed - Last Heart Rate in normal range    Pulse Readings from Last 1 Encounters:  10/27/23 74         Passed - Valid encounter within last 6 months    Recent Outpatient Visits           1 month ago Sinusitis chronic, frontal   [place] [facility] [place], Caryl Asp A, FNP   2 months ago COPD with acute exacerbation [facility])   [place] [facility] [place], Monico Blitz, DO   3 months ago Encounter for annual wellness visit (AWV) in Medicare patient   [facility] [place], Marzella Schlein, MD   3 months ago Anxiety   [place] [facility] [place], [place], PA-C   9 months ago Diabetes mellitus with proteinuria [facility])   [place] [facility] [place], Marzella Schlein, MD       Future Appointments             In 2 months Salena Saner, MD [facility] at [place]   In 3 months [place], Marzella Schlein, MD [facility], [facility]

## 2023-11-23 DIAGNOSIS — Z794 Long term (current) use of insulin: Secondary | ICD-10-CM | POA: Diagnosis not present

## 2023-11-23 DIAGNOSIS — Z1330 Encounter for screening examination for mental health and behavioral disorders, unspecified: Secondary | ICD-10-CM | POA: Diagnosis not present

## 2023-11-23 DIAGNOSIS — E785 Hyperlipidemia, unspecified: Secondary | ICD-10-CM | POA: Diagnosis not present

## 2023-11-23 DIAGNOSIS — Z8249 Family history of ischemic heart disease and other diseases of the circulatory system: Secondary | ICD-10-CM | POA: Diagnosis not present

## 2023-11-23 DIAGNOSIS — I10 Essential (primary) hypertension: Secondary | ICD-10-CM | POA: Diagnosis not present

## 2023-11-23 DIAGNOSIS — J9611 Chronic respiratory failure with hypoxia: Secondary | ICD-10-CM | POA: Diagnosis not present

## 2023-11-23 DIAGNOSIS — E1121 Type 2 diabetes mellitus with diabetic nephropathy: Secondary | ICD-10-CM | POA: Diagnosis not present

## 2023-11-23 DIAGNOSIS — I2510 Atherosclerotic heart disease of native coronary artery without angina pectoris: Secondary | ICD-10-CM | POA: Diagnosis not present

## 2023-11-23 DIAGNOSIS — J449 Chronic obstructive pulmonary disease, unspecified: Secondary | ICD-10-CM | POA: Diagnosis not present

## 2023-11-28 ENCOUNTER — Other Ambulatory Visit: Payer: Self-pay | Admitting: Pulmonary Disease

## 2023-11-28 DIAGNOSIS — J418 Mixed simple and mucopurulent chronic bronchitis: Secondary | ICD-10-CM

## 2023-11-29 ENCOUNTER — Other Ambulatory Visit: Payer: Self-pay | Admitting: Internal Medicine

## 2023-11-30 ENCOUNTER — Other Ambulatory Visit: Payer: Self-pay | Admitting: Pulmonary Disease

## 2023-12-01 DIAGNOSIS — L570 Actinic keratosis: Secondary | ICD-10-CM | POA: Diagnosis not present

## 2023-12-01 DIAGNOSIS — I10 Essential (primary) hypertension: Secondary | ICD-10-CM | POA: Diagnosis not present

## 2023-12-01 DIAGNOSIS — L814 Other melanin hyperpigmentation: Secondary | ICD-10-CM | POA: Diagnosis not present

## 2023-12-01 DIAGNOSIS — L821 Other seborrheic keratosis: Secondary | ICD-10-CM | POA: Diagnosis not present

## 2023-12-01 DIAGNOSIS — Z872 Personal history of diseases of the skin and subcutaneous tissue: Secondary | ICD-10-CM | POA: Diagnosis not present

## 2023-12-01 DIAGNOSIS — D229 Melanocytic nevi, unspecified: Secondary | ICD-10-CM | POA: Diagnosis not present

## 2023-12-01 DIAGNOSIS — Z85828 Personal history of other malignant neoplasm of skin: Secondary | ICD-10-CM | POA: Diagnosis not present

## 2023-12-08 DIAGNOSIS — E1121 Type 2 diabetes mellitus with diabetic nephropathy: Secondary | ICD-10-CM | POA: Diagnosis not present

## 2024-01-04 ENCOUNTER — Other Ambulatory Visit: Payer: Self-pay | Admitting: Internal Medicine

## 2024-01-04 ENCOUNTER — Other Ambulatory Visit: Payer: Self-pay | Admitting: Family Medicine

## 2024-01-04 DIAGNOSIS — E1140 Type 2 diabetes mellitus with diabetic neuropathy, unspecified: Secondary | ICD-10-CM

## 2024-01-26 ENCOUNTER — Ambulatory Visit: Payer: Medicare PPO | Admitting: Pulmonary Disease

## 2024-01-26 ENCOUNTER — Encounter: Payer: Self-pay | Admitting: Pulmonary Disease

## 2024-01-26 VITALS — BP 146/80 | HR 98 | Temp 97.7°F | Ht 65.0 in | Wt 183.8 lb

## 2024-01-26 DIAGNOSIS — J9611 Chronic respiratory failure with hypoxia: Secondary | ICD-10-CM

## 2024-01-26 DIAGNOSIS — Z87891 Personal history of nicotine dependence: Secondary | ICD-10-CM | POA: Diagnosis not present

## 2024-01-26 DIAGNOSIS — J449 Chronic obstructive pulmonary disease, unspecified: Secondary | ICD-10-CM

## 2024-01-26 DIAGNOSIS — R0602 Shortness of breath: Secondary | ICD-10-CM

## 2024-01-26 NOTE — Progress Notes (Signed)
 Subjective:    Patient ID: Billy Wise, male    DOB: July 05, 1946, 78 y.o.   MRN: [id number]  Patient Care Team: Erasmo Downer, MD as PCP - General (Family Medicine) Delorise Royals, MD as Referring Physician (Endocrinology) Francee Nodal, MD as Referring Physician (Dermatology) Lockie Mola, MD as Referring Physician (Ophthalmology) Salena Saner, MD as Consulting Physician (Pulmonary Disease)  Chief Complaint  Patient presents with   Follow-up    Cough with white phlegm. DOE. No wheezing.     BACKGROUND/INTERVAL: Billy Wise is a 79 year old former smoker who follows here for the issue of COPD with chronic hypoxic respiratory failure due to the same.  He was last seen on a follow-up visit on 27 October 2023.  He was started on [place] on 19 August 2023.  He has participated in pulmonary rehab previously.   HPI Discussed the use of AI scribe software for clinical note transcription with the patient, who gave verbal consent to proceed.  History of Present Illness   Billy Wise "Billy Wise" is a 78 year old male with COPD who presents for follow-up regarding his respiratory symptoms.  He has experienced an increase in mucus production since starting [facility], which he believes is aiding in clearing mucus that was previously difficult to expel.  We discussed that this mucus may have been obstructing his smaller airways, affecting his breathing due to his COPD, which includes both emphysema and chronic bronchitis. No recent fevers or chills have been noted.  Patient states that overall, since starting [facility] he feels markedly better.  His breathing is easier.  He uses Ohtuvayre twice daily, in the morning and at night, and feels it is beneficial for his condition. He also continues to use albuterol, taking it as the last thing at night.  Allergy season has been particularly troublesome for him, and to manage this, he uses a NeilMed sinus rinse  three to four times a day and applies a paste solution provided by an ENT specialist, though he cannot recall its name.   He is on oxygen via POC which he uses when ambulating.  Still has a concentrator for home use during the night.  He is able to be off of the oxygen when sitting quietly.     DATA: CBC 10/28/17>>eosinophils equals 0. Sleep Study 11/27/17>> no sleep apnea.  Nocturnal desaturations noted. Desat walk 11/15/17>> baseline sat on RA at rest was 91% and HR 89. Walked 360 feet at a very brisk pace, was conversational. Sat was 88% and HR 112.  Moderate dyspnea. CT chest personally reviewed>> CT hi-res  02/01/18; chest x-ray 10/28/17; there is mild basilar fibrosis, bronchiectatic changes with bronchial thickening, severe apical emphysema. Findings not likely due to UIP.  PFT tracings personally reviewed from 02/01/18>> FVC is 86% predicted, FEV1 is 67% predicted, there is no improvement with bronchodilator.  Ratio 61%. DLCO is reduced at 52%.Flow volume loop is obstructive. Ambulatory oximetry 07/19/2020: Desaturations with exercise, initiated oxygen at 2 L/min 2D echocardiogram 07/26/2020: LVEF 60 to 65%, grade 1 DD, moderate elevation on pulmonary artery systolic pressure. PFTs 07/31/2020: FEV1 1.57 L or 57% predicted, FVC 2.79 L or 74% predicted.  FEV1/FVC 56%.  Postbronchodilator there is a 10% change in FEV1 lung volumes showed mild air trapping no hyperinflation, diffusion capacity moderately reduced.  Consistent with COPD/emphysema.  Flow volume loop is delayed consistent with obstructive lung disease. Alpha 1 antitrypsin 12/15/2022: MM phenotype, level 128 mg/dL PFTs 40/98/1191: FEV1 4.78 L  or 51% predicted, FVC 2.55 L or 73% predicted, FEV1/FVC 51%, no significant bronchodilator response.  Lung volumes show air trapping.  Flow volume loop is consistent with obstruction.  Diffusion capacity is severely impaired.  This is consistent with COPD on the basis of emphysema.  As the prior study of  31 July 2020 there has been minimal decline in function. CT chest low-dose 05/04/2023: Lung RADS 2 benign appearance and behavior.  Bilateral lower lobe cylindrical bronchiectasis.  Moderate emphysema.  Prior pulmonary nodules resolve indicating inflammatory/infectious cause. Chest x-ray PA and lateral 07/13/2023: No active cardiopulmonary disease.  Coarsened interstitial markings.  Review of Systems A 10 point review of systems was performed and it is as noted above otherwise negative.   Patient Active Problem List   Diagnosis Date Noted   Numbness and tingling of both feet 09/22/2023   Chronic rhinosinusitis 09/22/2023   Type 2 diabetes mellitus with diabetic neuropathy (HCC) 09/22/2023   Anxiety 07/31/2023   Accelerating angina (HCC) 12/29/2022   Coronary artery disease of native artery of native heart with stable angina pectoris (HCC) 11/05/2022   Aortic atherosclerosis (HCC) 11/05/2022   Senile purpura (HCC) 08/14/2022   Diabetes mellitus with proteinuria (HCC) 08/14/2022   Chronic hypoxic respiratory failure, on home oxygen therapy (HCC) 08/07/2021   Venous stasis 08/07/2021   Other secondary pulmonary hypertension (HCC) 07/26/2020   Diastolic dysfunction 07/26/2020   Obesity 10/25/2019   History of anemia 04/20/2019   Gastroesophageal reflux disease without esophagitis 09/09/2018   Hyperlipidemia associated with type 2 diabetes mellitus (HCC) 09/09/2018   COPD (chronic obstructive pulmonary disease) (HCC) 10/28/2017   Hypertension associated with diabetes (HCC) 10/28/2017   Insomnia 09/03/2016   BPH with obstruction/lower urinary tract symptoms 12/16/2015    Social History   Tobacco Use   Smoking status: Former    Current packs/day: 0.00    Average packs/day: 1.5 packs/day for 50.0 years (75.0 ttl pk-yrs)    Types: Cigarettes    Start date: 09/29/1958    Quit date: 09/29/2008    Years since quitting: 15.3   Smokeless tobacco: Never  Substance Use Topics   Alcohol  use: Not Currently    Comment: 2 drinks a month / cocktail    No Known Allergies  Current Meds  Medication Sig   Accu-Chek Softclix Lancets lancets Use to check blood sugar twice a day. DX E11.9   amLODipine (NORVASC) 5 MG tablet TAKE 1 TABLET(5 MG) BY MOUTH DAILY   benzonatate (TESSALON) 100 MG capsule Take 1 capsule (100 mg total) by mouth 2 (two) times daily as needed for cough.   Blood Glucose Monitoring Suppl (ACCU-CHEK AVIVA PLUS) w/Device KIT Use to check blood sugar twice a day.  DX E11.9   BREZTRI AEROSPHERE 160-9-4.8 MCG/ACT AERO INHALE 2 PUFFS INTO THE LUNGS IN THE MORNING AND AT BEDTIME   dapagliflozin propanediol (FARXIGA) 10 MG TABS tablet Take 1 tablet (10 mg total) by mouth daily.   Ensifentrine (OHTUVAYRE) 3 MG/2.5ML SUSP Inhale 2.5 mLs into the lungs in the morning and at bedtime. **refills to be sent to [facility]**   finasteride (PROSCAR) 5 MG tablet TAKE 1 TABLET(5 MG) BY MOUTH DAILY   fluticasone (FLONASE) 50 MCG/ACT nasal spray Place 2 sprays into both nostrils daily.   gabapentin (NEURONTIN) 100 MG capsule TAKE 1 CAPSULE(100 MG) BY MOUTH THREE TIMES DAILY   gentamicin ointment (GARAMYCIN) 0.1 % Apply 1 Application topically 3 (three) times daily.   glucose blood (ACCU-CHEK GUIDE) test strip Use  as instructed   hydrOXYzine (ATARAX) 25 MG tablet Take 1 tablet (25 mg total) by mouth 3 (three) times daily.   insulin glargine (LANTUS SOLOSTAR) 100 UNIT/ML Solostar Pen Inject 50 Units into the skin at bedtime.   metFORMIN (GLUCOPHAGE) 1000 MG tablet TAKE 1 TABLET(1000 MG) BY MOUTH TWICE DAILY   omeprazole (PRILOSEC) 20 MG capsule TAKE 1 CAPSULE(20 MG) BY MOUTH DAILY   ramipril (ALTACE) 5 MG capsule TAKE 2 CAPSULES BY MOUTH EVERY MORNING AND 1 CAPSULE EVERY NIGHT AT BEDTIME   rosuvastatin (CRESTOR) 10 MG tablet TAKE 1 TABLET(10 MG) BY MOUTH DAILY   Spacer/Aero-Holding Chambers (AEROCHAMBER PLS FLOVU MTHPIECE) DEVI Use as directed   tamsulosin (FLOMAX)  0.4 MG CAPS capsule TAKE 2 CAPSULES(0.8 MG) BY MOUTH DAILY   TRULICITY 0.75 MG/0.5ML SOAJ Inject 0.75 mg into the skin once a week.   zolpidem (AMBIEN) 10 MG tablet TAKE 1 TABLET(10 MG) BY MOUTH AT BEDTIME    Immunization History  Administered Date(s) Administered   Fluad Quad(high Dose 65+) 08/05/2020, 08/02/2022, 08/02/2022   Influenza, High Dose Seasonal PF 07/24/2015, 07/21/2016, 08/02/2017, 08/06/2017, 07/27/2018   Influenza,inj,Quad PF,6+ Mos 06/27/2019   Influenza-Unspecified 09/14/2012, 07/19/2021, 08/08/2023   Moderna SARS-COV2 Booster Vaccination 07/06/2021   PFIZER(Purple Top)SARS-COV-2 Vaccination 11/10/2019, 12/01/2019, 07/14/2020   Pfizer Covid-19 Vaccine Bivalent Booster 32yrs & up 01/26/2021   Pfizer(Comirnaty)Fall Seasonal Vaccine 12 years and older 08/02/2022   Pneumococcal Conjugate-13 08/25/2014   Pneumococcal Polysaccharide-23 01/26/2016   Pneumococcal-Unspecified 12/17/2005   Rsv, Bivalent, Protein Subunit Rsvpref,pf Verdis Frederickson) 09/05/2022   Tdap 12/17/2005, 02/25/2016   Zoster Recombinant(Shingrix) 08/23/2021, 06/05/2022   Zoster, Live 11/24/2013        Objective:     BP (!) 146/80 (BP Location: Left Arm, Patient Position: Sitting, Cuff Size: Normal)   Pulse 98   Temp 97.7 F (36.5 C) (Temporal)   Ht 5\' 5"  (1.651 m)   Wt 183 lb 12.8 oz (83.4 kg)   SpO2 93%   BMI 30.59 kg/m   SpO2: 93 %  GENERAL: Awake, alert, no respiratory distress.  Well-appearing, fully ambulatory.  Uses POC with activity. HEAD: Normocephalic, atraumatic.  EYES: Pupils equal, round, reactive to light.  No scleral icterus.  MOUTH: Oral mucosa moist.  Dentition intact. NECK: Supple. No thyromegaly. Trachea midline. No JVD.  No adenopathy. PULMONARY: Good air entry bilaterally.  Faint dry crackles at bases (chronic), no other adventitious sounds. CARDIOVASCULAR: S1 and S2.  No rubs, murmurs or gallops heard. ABDOMEN: Protuberant, truncal obesity, otherwise benign. MUSCULOSKELETAL:  No joint deformity, no clubbing, trace edema.  NEUROLOGIC: No deficits, no gait disturbance, speech is fluent. SKIN: Intact,warm,dry.  Bilateral stasis changes lower extremities. PSYCH: Mood and behavior normal.    Assessment & Plan:     ICD-10-CM   1. COPD, moderate (HCC) Moderate/Severe  J44.9 Pulmonary function test    2. Chronic respiratory failure with hypoxia (HCC)  J96.11 Pulmonary function test    3. SOB (shortness of breath)  R06.02 Pulmonary function test    4. Former smoker  Z87.891       Orders Placed This Encounter  Procedures   Pulmonary function test    Standing Status:   Future    Expected Date:   04/03/2024    Expiration Date:   01/25/2025    Where should this test be performed?:   Outpatient Pulmonary    What type of PFT is being ordered?:   Full PFT    MIP/MEP:   No   Discussion:  Chronic Obstructive Pulmonary Disease (COPD) with emphysema and chronic bronchitis COPD with emphysema and chronic bronchitis. Reports improved breathing effort with Ohtuvayre, likely addressing chronic bronchitis by clearing mucus. Increased mucus production likely due to clearing of previously trapped mucus. Continues albuterol, advised to use before Ohtuvayre to enhance airway opening. No recent fevers or chills, indicating no acute exacerbation. - Continue Ohtuvayre twice daily - Continue albuterol as needed, preferably before Ohtuvayre - Schedule pulmonary function tests (PFTs) in June for comparison with previous results  Allergic Rhinitis Significant symptoms due to pollen exposure. Manages symptoms with no med sinus rinse and a paste solution from ENT, which appears effective. - Continue NeilMed sinus rinse three to four times daily - Continue using the paste solution as directed by ENT     Advised if symptoms do not improve or worsen, to please contact office for sooner follow up or seek emergency care.    I spent 31 minutes of dedicated to the care of this patient on  the date of this encounter to include pre-visit review of records, face-to-face time with the patient discussing conditions above, post visit ordering of testing, clinical documentation with the electronic health record, making appropriate referrals as documented, and communicating necessary findings to members of the patients care team.     C. Danice Goltz, MD Advanced Bronchoscopy PCCM [facility]    *This note was generated using voice recognition software/Dragon and/or AI transcription program.  Despite best efforts to proofread, errors can occur which can change the meaning. Any transcriptional errors that result from this process are unintentional and may not be fully corrected at the time of dictation.

## 2024-01-26 NOTE — Patient Instructions (Addendum)
 VISIT SUMMARY:  You came in today for a follow-up regarding your respiratory symptoms related to COPD. You reported an increase in mucus production since starting Ohtuvayre, which you believe is helping clear mucus that was previously difficult to expel. You also mentioned that pollen has been particularly troublesome for you, and you are managing it with a Neomed sinus rinse and a paste solution from your ENT specialist.  YOUR PLAN:  -CHRONIC OBSTRUCTIVE PULMONARY DISEASE (COPD) WITH EMPHYSEMA AND CHRONIC BRONCHITIS: COPD is a chronic lung condition that includes emphysema and chronic bronchitis, making it hard to breathe. You should continue using Ohtuvayre twice daily and albuterol as needed, preferably before Ohtuvayre to help open your airways. We will schedule pulmonary function tests (PFTs) in June to compare with your previous results.  -ALLERGIC RHINITIS: Allergic rhinitis is an allergic reaction that causes sneezing, congestion, and a runny nose. You should continue using the NeilMed sinus rinse three to four times daily and the paste solution as directed by your ENT specialist.  INSTRUCTIONS:  Please continue using Ohtuvayre twice daily and albuterol as needed, preferably before Ohtuvayre. Schedule your pulmonary function tests (PFTs) in June. Continue using the NeilMed sinus rinse three to four times daily and the paste solution as directed by your ENT specialist.

## 2024-02-08 ENCOUNTER — Other Ambulatory Visit: Payer: Self-pay | Admitting: Family Medicine

## 2024-02-09 NOTE — Telephone Encounter (Signed)
 Requested Prescriptions  Pending Prescriptions Disp Refills   oxybutynin  (DITROPAN -XL) 10 MG 24 hr tablet [Pharmacy Med Name: OXYBUTYNIN  ER 10MG  TABLETS] 90 tablet 0    Sig: TAKE 1 TABLET(10 MG) BY MOUTH DAILY     Urology:  Bladder Agents Failed - 02/09/2024  8:20 AM      Failed - Valid encounter within last 12 months    Recent Outpatient Visits   None     Future Appointments             In 6 days Bacigalupo, Stan Eans, MD [facility], [facility]

## 2024-02-12 ENCOUNTER — Other Ambulatory Visit: Payer: Self-pay | Admitting: Family Medicine

## 2024-02-12 DIAGNOSIS — I152 Hypertension secondary to endocrine disorders: Secondary | ICD-10-CM

## 2024-02-14 NOTE — Telephone Encounter (Signed)
 Appt 02/15/24- will refill Requested Prescriptions  Pending Prescriptions Disp Refills   finasteride  (PROSCAR ) 5 MG tablet [Pharmacy Med Name: FINASTERIDE  5MG  TABLETS] 90 tablet 0    Sig: TAKE 1 TABLET(5 MG) BY MOUTH DAILY     Urology: 5-alpha Reductase Inhibitors Failed - 02/14/2024 11:18 AM      Failed - PSA in normal range and within 360 days    PSA  Date Value Ref Range Status  04/06/2018 0.6  Final   Prostate Specific Ag, Serum  Date Value Ref Range Status  08/14/2022 0.3 0.0 - 4.0 ng/mL Final    Comment:    Roche ECLIA methodology. According to the American Urological Association, Serum PSA should decrease and remain at undetectable levels after radical prostatectomy. The AUA defines biochemical recurrence as an initial PSA value 0.2 ng/mL or greater followed by a subsequent confirmatory PSA value 0.2 ng/mL or greater. Values obtained with different assay methods or kits cannot be used interchangeably. Results cannot be interpreted as absolute evidence of the presence or absence of malignant disease.          Failed - Valid encounter within last 12 months    Recent Outpatient Visits   None     Future Appointments             Tomorrow Bacigalupo, Stan Eans, MD [facility], [facility]             metFORMIN  (GLUCOPHAGE ) 1000 MG tablet [Pharmacy Med Name: METFORMIN  1000MG  TABLETS] 180 tablet 0    Sig: TAKE 1 TABLET(1000 MG) BY MOUTH TWICE DAILY     Endocrinology:  Diabetes - Biguanides Failed - 02/14/2024 11:18 AM      Failed - B12 Level in normal range and within 720 days    Vitamin B-12  Date Value Ref Range Status  09/22/2023 >2000 (H) 232 - 1245 pg/mL Final         Failed - Valid encounter within last 6 months    Recent Outpatient Visits   None     Future Appointments             Tomorrow Bacigalupo, Stan Eans, MD [facility], [facility]            Failed - CBC within normal limits and completed in the last  12 months    WBC  Date Value Ref Range Status  12/22/2022 7.1 4.0 - 10.5 K/uL Final   RBC  Date Value Ref Range Status  12/22/2022 5.45 4.22 - 5.81 MIL/uL Final   Hemoglobin  Date Value Ref Range Status  12/29/2022 13.9 13.0 - 17.0 g/dL Final  96/01/5408 81.1 13.0 - 17.7 g/dL Final   HCT  Date Value Ref Range Status  12/29/2022 41.0 39.0 - 52.0 % Final   Hematocrit  Date Value Ref Range Status  08/14/2022 43.2 37.5 - 51.0 % Final   MCHC  Date Value Ref Range Status  12/22/2022 32.4 30.0 - 36.0 g/dL Final   [facility]  Date Value Ref Range Status  12/22/2022 26.8 26.0 - 34.0 pg Final   MCV  Date Value Ref Range Status  12/22/2022 82.6 80.0 - 100.0 fL Final  08/14/2022 82 79 - 97 fL Final  03/29/2014 84 80 - 100 fL Final   No results found for: "PLTCOUNTKUC", "LABPLAT", "POCPLA" RDW  Date Value Ref Range Status  12/22/2022 14.9 11.5 - 15.5 % Final  08/14/2022 14.3 11.6 - 15.4 % Final  03/29/2014 14.2 11.5 -  14.5 % Final         Passed - Cr in normal range and within 360 days    Creatinine  Date Value Ref Range Status  03/29/2014 2.20 (H) 0.60 - 1.30 mg/dL Final   Creatinine, Ser  Date Value Ref Range Status  09/22/2023 1.22 0.76 - 1.27 mg/dL Final   Creatinine, Urine  Date Value Ref Range Status  10/22/2022 88.4  Final         Passed - HBA1C is between 0 and 7.9 and within 180 days    Hemoglobin A1C  Date Value Ref Range Status  06/24/2023 7.0  Final   Hgb A1c MFr Bld  Date Value Ref Range Status  09/22/2023 6.5 (H) 4.8 - 5.6 % Final    Comment:             Prediabetes: 5.7 - 6.4          Diabetes: >6.4          Glycemic control for adults with diabetes: <7.0          Passed - eGFR in normal range and within 360 days    EGFR (African American)  Date Value Ref Range Status  03/29/2014 35 (L)  Final   GFR calc Af Amer  Date Value Ref Range Status  08/05/2020 93 >59 mL/min/1.73 Final    Comment:    **In accordance with recommendations from the  NKF-ASN Task force,**   [facility] is in the process of updating its eGFR calculation to the   2021 CKD-EPI creatinine equation that estimates kidney function   without a race variable.    EGFR (Non-African Amer.)  Date Value Ref Range Status  03/29/2014 30 (L)  Final    Comment:    eGFR values <48mL/min/1.73 m2 may be an indication of chronic kidney disease (CKD). Calculated eGFR is useful in patients with stable renal function. The eGFR calculation will not be reliable in acutely ill patients when serum creatinine is changing rapidly. It is not useful in  patients on dialysis. The eGFR calculation may not be applicable to patients at the low and high extremes of body sizes, pregnant women, and vegetarians.    GFR, Estimated  Date Value Ref Range Status  12/22/2022 >60 >60 mL/min Final    Comment:    (NOTE) Calculated using the CKD-EPI Creatinine Equation (2021)    eGFR  Date Value Ref Range Status  09/22/2023 62 >59 mL/min/1.73 Final          amLODipine  (NORVASC ) 5 MG tablet [Pharmacy Med Name: AMLODIPINE  BESYLATE 5MG  TABLETS] 90 tablet 0    Sig: TAKE 1 TABLET(5 MG) BY MOUTH DAILY     Cardiovascular: Calcium  Channel Blockers 2 Failed - 02/14/2024 11:18 AM      Failed - Last BP in normal range    BP Readings from Last 1 Encounters:  01/26/24 (!) 146/80         Failed - Valid encounter within last 6 months    Recent Outpatient Visits   None     Future Appointments             Tomorrow Bacigalupo, Stan Eans, MD [facility], [facility]            Passed - Last Heart Rate in normal range    Pulse Readings from Last 1 Encounters:  01/26/24 98

## 2024-02-15 ENCOUNTER — Encounter: Payer: Self-pay | Admitting: Family Medicine

## 2024-02-15 ENCOUNTER — Ambulatory Visit: Payer: Self-pay | Admitting: Family Medicine

## 2024-02-15 VITALS — BP 141/69 | HR 77 | Ht 64.5 in | Wt 184.6 lb

## 2024-02-15 DIAGNOSIS — E1140 Type 2 diabetes mellitus with diabetic neuropathy, unspecified: Secondary | ICD-10-CM

## 2024-02-15 DIAGNOSIS — E1169 Type 2 diabetes mellitus with other specified complication: Secondary | ICD-10-CM | POA: Diagnosis not present

## 2024-02-15 DIAGNOSIS — E1159 Type 2 diabetes mellitus with other circulatory complications: Secondary | ICD-10-CM | POA: Diagnosis not present

## 2024-02-15 DIAGNOSIS — E1129 Type 2 diabetes mellitus with other diabetic kidney complication: Secondary | ICD-10-CM | POA: Diagnosis not present

## 2024-02-15 DIAGNOSIS — E785 Hyperlipidemia, unspecified: Secondary | ICD-10-CM

## 2024-02-15 DIAGNOSIS — Z6831 Body mass index (BMI) 31.0-31.9, adult: Secondary | ICD-10-CM

## 2024-02-15 DIAGNOSIS — D692 Other nonthrombocytopenic purpura: Secondary | ICD-10-CM

## 2024-02-15 DIAGNOSIS — N401 Enlarged prostate with lower urinary tract symptoms: Secondary | ICD-10-CM

## 2024-02-15 DIAGNOSIS — R200 Anesthesia of skin: Secondary | ICD-10-CM | POA: Diagnosis not present

## 2024-02-15 DIAGNOSIS — I152 Hypertension secondary to endocrine disorders: Secondary | ICD-10-CM

## 2024-02-15 DIAGNOSIS — E66811 Obesity, class 1: Secondary | ICD-10-CM

## 2024-02-15 DIAGNOSIS — G4700 Insomnia, unspecified: Secondary | ICD-10-CM

## 2024-02-15 DIAGNOSIS — Z794 Long term (current) use of insulin: Secondary | ICD-10-CM

## 2024-02-15 DIAGNOSIS — N138 Other obstructive and reflux uropathy: Secondary | ICD-10-CM

## 2024-02-15 LAB — HM DIABETES EYE EXAM

## 2024-02-15 MED ORDER — LANTUS SOLOSTAR 100 UNIT/ML ~~LOC~~ SOPN
32.0000 [IU] | PEN_INJECTOR | Freq: Every day | SUBCUTANEOUS | Status: AC
Start: 2024-02-15 — End: ?

## 2024-02-15 NOTE — Assessment & Plan Note (Signed)
 BPH managed with Flomax . Experiences nocturia and dry mouth with increased Flomax  dosage. Current regimen adjusted to one pill at night and sometimes one during the day to manage symptoms. Discussed balance between managing nocturia and avoiding dehydration. - Continue current Flomax  regimen: one pill at night, adjust as needed during the day

## 2024-02-15 NOTE — Assessment & Plan Note (Signed)
 Chronic insomnia managed with Ambien . Reports inadequate sleep and has been splitting Ambien  doses. Discussed risks of long-term Ambien  use, including dementia and falls. Recommended adding melatonin and magnesium (glycinate or threonine) to improve sleep quality and enhance Ambien  effects. - Recommend melatonin 5 mg nightly - Recommend magnesium glycinate or threonine, one pill nightly - Continue Ambien  10 mg QHS

## 2024-02-15 NOTE — Progress Notes (Signed)
 Established patient visit   Patient: Billy Wise   DOB: [birth year]   78 y.o. Male  MRN: [id number] Visit Date: 02/15/2024  Today's healthcare provider: Aden Agreste, MD   Chief Complaint  Patient presents with   Medical Management of Chronic Issues   Hyperlipidemia   Hypertension    Pt checks on occasions with no symptoms to report   Diabetes    Patient reports avg glucose is 126 according to CGM Symptoms:visual changes, appetite changes Eye Exam: today   Subjective    Hyperlipidemia  Hypertension  Diabetes   HPI     Hypertension    Additional comments: Pt checks on occasions with no symptoms to report        Diabetes    Additional comments: Patient reports avg glucose is 126 according to CGM Symptoms:visual changes, appetite changes Eye Exam: today      Last edited by Pasty Bongo, CMA on 02/15/2024  8:41 AM.       Discussed the use of AI scribe software for clinical note transcription with the patient, who gave verbal consent to proceed.  History of Present Illness   Billy Wise "[name]" is a 78 year old male with type two diabetes, hypertension, hyperlipidemia, COPD, chronic hypoxic respiratory failure on home oxygen , and obesity who presents for chronic follow-up.  He experiences chronic insomnia with difficulty maintaining sleep, despite taking Ambien  10 mg at bedtime. He frequently awakens every hour after initially sleeping for two hours, often due to nocturia. He sometimes takes one and a half pills of Ambien  to improve sleep. He attempted to double his Flomax  dose to manage nocturia, but this resulted in dry mouth, prompting a return to the original dose. He consumes about three Yeti cups of water daily, drinking fluids throughout the day and night.  His type two diabetes is managed with Lantus  32 units at night, Trulicity  0.75 mg weekly, and Lispro 12 units in the morning, 10 units at lunch, and 12 units at supper. He recently  switched to using a Libre 3 plus sensor for glucose monitoring, which has been alarming for low blood sugars at night, though fingerstick checks do not confirm these lows. He suspects the sensor readings are inaccurate due to his sleeping position. Previously, he was on 50 units of Lantus , but this was reduced due to the sensor readings. He has not experienced true hypoglycemia since discontinuing Ozempic.  He takes gabapentin  100 mg three times daily for diabetic neuropathy, which has significantly improved his symptoms, particularly in his legs. Gabapentin  helps him fall asleep faster, although it does not help him stay asleep. He has a history of elevated B12 levels after starting supplementation for previously low levels and now takes B12 every other day. He also reports a history of numbness and tingling, which has mostly resolved with gabapentin  use.  He is due for a foot exam and has an eye appointment scheduled for later today. He has been informed of developing cataracts, which he feels have affected his vision, making it more cloudy.         Medications: Outpatient Medications Prior to Visit  Medication Sig   Accu-Chek Softclix Lancets lancets Use to check blood sugar twice a day. DX E11.9   albuterol  (PROVENTIL ) (2.5 MG/3ML) 0.083% nebulizer solution Take 3 mLs (2.5 mg total) by nebulization every 6 (six) hours as needed for wheezing or shortness of breath.   albuterol  (VENTOLIN  HFA) 108 (90 Base) MCG/ACT inhaler INHALE  2 PUFFS BY MOUTH EVERY 6 HOURS AS NEEDED FOR WHEEZING AND SHORTNESS OF BREATH   amLODipine  (NORVASC ) 5 MG tablet TAKE 1 TABLET(5 MG) BY MOUTH DAILY   ascorbic acid (VITAMIN C) 1000 MG tablet Take 1 tablet by mouth daily.   benzonatate  (TESSALON ) 100 MG capsule Take 1 capsule (100 mg total) by mouth 2 (two) times daily as needed for cough.   Blood Glucose Monitoring Suppl (ACCU-CHEK AVIVA PLUS) w/Device KIT Use to check blood sugar twice a day.  DX E11.9   BREZTRI   AEROSPHERE 160-9-4.8 MCG/ACT AERO INHALE 2 PUFFS INTO THE LUNGS IN THE MORNING AND AT BEDTIME   Continuous Glucose Sensor (FREESTYLE LIBRE 3 PLUS SENSOR) MISC by Does not apply route. Change sensor every 15 days.   dapagliflozin  propanediol (FARXIGA ) 10 MG TABS tablet Take 1 tablet (10 mg total) by mouth daily.   Ensifentrine  (OHTUVAYRE ) 3 MG/2.5ML SUSP Inhale 2.5 mLs into the lungs in the morning and at bedtime. **refills to be sent to [facility]**   finasteride  (PROSCAR ) 5 MG tablet TAKE 1 TABLET(5 MG) BY MOUTH DAILY   fluticasone  (FLONASE ) 50 MCG/ACT nasal spray Place 2 sprays into both nostrils daily.   gabapentin  (NEURONTIN ) 100 MG capsule TAKE 1 CAPSULE(100 MG) BY MOUTH THREE TIMES DAILY   gentamicin ointment (GARAMYCIN) 0.1 % Apply 1 Application topically 3 (three) times daily.   glucose blood (ACCU-CHEK GUIDE) test strip Use as instructed   hydrOXYzine  (ATARAX ) 25 MG tablet Take 1 tablet (25 mg total) by mouth 3 (three) times daily.   insulin  lispro (HUMALOG) 100 UNIT/ML KwikPen Junior 12 units at breakfast, 10 units at lunch and 12 units at supper time   Insulin  Pen Needle (PEN NEEDLES) 31G X 5 MM MISC 1 each by Does not apply route daily as needed.   isosorbide  mononitrate (IMDUR ) 30 MG 24 hr tablet TAKE 1/2 TABLET(15 MG) BY MOUTH DAILY   Lactobacillus (PROBIOTIC ACIDOPHILUS PO) Take 1 capsule by mouth daily.    metFORMIN  (GLUCOPHAGE ) 1000 MG tablet TAKE 1 TABLET(1000 MG) BY MOUTH TWICE DAILY   omeprazole  (PRILOSEC) 20 MG capsule TAKE 1 CAPSULE(20 MG) BY MOUTH DAILY   oxybutynin  (DITROPAN -XL) 10 MG 24 hr tablet TAKE 1 TABLET(10 MG) BY MOUTH DAILY   ramipril  (ALTACE ) 5 MG capsule TAKE 2 CAPSULES BY MOUTH EVERY MORNING AND 1 CAPSULE EVERY NIGHT AT BEDTIME   rosuvastatin  (CRESTOR ) 10 MG tablet TAKE 1 TABLET(10 MG) BY MOUTH DAILY   Spacer/Aero-Holding Chambers (AEROCHAMBER PLS FLOVU MTHPIECE) DEVI Use as directed   tamsulosin  (FLOMAX ) 0.4 MG CAPS capsule TAKE 2  CAPSULES(0.8 MG) BY MOUTH DAILY   TRULICITY  0.75 MG/0.5ML SOAJ Inject 0.75 mg into the skin once a week.   zolpidem  (AMBIEN ) 10 MG tablet TAKE 1 TABLET(10 MG) BY MOUTH AT BEDTIME   [DISCONTINUED] insulin  glargine (LANTUS  SOLOSTAR) 100 UNIT/ML Solostar Pen Inject 50 Units into the skin at bedtime.   nitroGLYCERIN  (NITROSTAT ) 0.4 MG SL tablet Place 1 tablet (0.4 mg total) under the tongue every 5 (five) minutes as needed for chest pain.   Facility-Administered Medications Prior to Visit  Medication Dose Route Frequency Provider   sodium chloride  flush (NS) 0.9 % injection 3 mL  3 mL Intravenous Q12H Gollan, Timothy J, MD    Review of Systems     Objective    BP (!) 141/69 (BP Location: Left Arm, Patient Position: Sitting, Cuff Size: Normal)   Pulse 77   Ht 5' 4.5" (1.638 m)   Wt 184 lb 9.6  oz (83.7 kg)   SpO2 95%   BMI 31.20 kg/m    Physical Exam Vitals reviewed.  Constitutional:      General: He is not in acute distress.    Appearance: Normal appearance. He is not diaphoretic.  HENT:     Head: Normocephalic and atraumatic.  Eyes:     General: No scleral icterus.    Conjunctiva/sclera: Conjunctivae normal.  Cardiovascular:     Rate and Rhythm: Normal rate and regular rhythm.     Heart sounds: Normal heart sounds. No murmur heard. Pulmonary:     Effort: Pulmonary effort is normal. No respiratory distress.     Breath sounds: Normal breath sounds. No wheezing or rhonchi.  Musculoskeletal:     Cervical back: Neck supple.     Right lower leg: No edema.     Left lower leg: No edema.  Lymphadenopathy:     Cervical: No cervical adenopathy.  Skin:    General: Skin is warm and dry.     Findings: No rash.  Neurological:     Mental Status: He is alert and oriented to person, place, and time. Mental status is at baseline.  Psychiatric:        Mood and Affect: Mood normal.        Behavior: Behavior normal.      No results found for any visits on 02/15/24.  Assessment & Plan      Problem List Items Addressed This Visit       Cardiovascular and Mediastinum   Hypertension associated with diabetes (HCC)   Relevant Medications   insulin  glargine (LANTUS  SOLOSTAR) 100 UNIT/ML Solostar Pen   Other Relevant Orders   Comprehensive metabolic panel with GFR   Senile purpura (HCC)     Endocrine   Hyperlipidemia associated with type 2 diabetes mellitus (HCC)   Relevant Medications   insulin  glargine (LANTUS  SOLOSTAR) 100 UNIT/ML Solostar Pen   Other Relevant Orders   Lipid Panel With LDL/HDL Ratio   Diabetes mellitus with proteinuria (HCC) - Primary   Relevant Medications   insulin  glargine (LANTUS  SOLOSTAR) 100 UNIT/ML Solostar Pen   Other Relevant Orders   Hemoglobin A1c   Type 2 diabetes mellitus with diabetic neuropathy (HCC)   Type 2 diabetes managed with Lantus , Lispro, Trulicity , and metformin . Lantus  adjusted from 50 to 32 units due to inaccurate low blood sugar readings from [place] sensor; no true hypoglycemia upon fingerstick verification. Diabetic neuropathy managed with gabapentin , improving symptoms. B12 supplementation adjusted to every other day due to previous elevation. - Check A1c, kidney and liver function, and B12 levels - Continue Lantus  32 units QHS, Lispro 12 units at breakfast, 10 units at lunch, 12 units at supper - Continue Trulicity  0.75 mg weekly - Continue metformin  1000 mg BID - Continue gabapentin  100 mg TID - Adjust B12 supplementation to every other day      Relevant Medications   insulin  glargine (LANTUS  SOLOSTAR) 100 UNIT/ML Solostar Pen     Genitourinary   BPH with obstruction/lower urinary tract symptoms   BPH managed with Flomax . Experiences nocturia and dry mouth with increased Flomax  dosage. Current regimen adjusted to one pill at night and sometimes one during the day to manage symptoms. Discussed balance between managing nocturia and avoiding dehydration. - Continue current Flomax  regimen: one pill at night, adjust as  needed during the day        Other   Insomnia   Chronic insomnia managed with Ambien . Reports inadequate sleep and has been  splitting Ambien  doses. Discussed risks of long-term Ambien  use, including dementia and falls. Recommended adding melatonin and magnesium (glycinate or threonine) to improve sleep quality and enhance Ambien  effects. - Recommend melatonin 5 mg nightly - Recommend magnesium glycinate or threonine, one pill nightly - Continue Ambien  10 mg QHS       Obesity   Relevant Medications   insulin  glargine (LANTUS  SOLOSTAR) 100 UNIT/ML Solostar Pen   Numbness and tingling of both feet   Relevant Orders   Vitamin B12      Routine follow-up for chronic conditions: type 2 diabetes, hypertension, hyperlipidemia, COPD, chronic hypoxic respiratory failure, and obesity. Current medications and management strategies discussed. Blood pressure slightly elevated during visit, but not typically high according to him. - Perform foot exam - Remind to see ophthalmologist and request report - Check A1c, kidney and liver function, cholesterol, and B12 levels - Recheck blood pressure during visit      Return in about 6 months (around 08/16/2024) for CPE.       Aden Agreste, MD  [facility] [phone] (phone) [phone] (fax)  [facility]

## 2024-02-15 NOTE — Assessment & Plan Note (Signed)
 Type 2 diabetes managed with Lantus , Lispro, Trulicity , and metformin . Lantus  adjusted from 50 to 32 units due to inaccurate low blood sugar readings from [place] sensor; no true hypoglycemia upon fingerstick verification. Diabetic neuropathy managed with gabapentin , improving symptoms. B12 supplementation adjusted to every other day due to previous elevation. - Check A1c, kidney and liver function, and B12 levels - Continue Lantus  32 units QHS, Lispro 12 units at breakfast, 10 units at lunch, 12 units at supper - Continue Trulicity  0.75 mg weekly - Continue metformin  1000 mg BID - Continue gabapentin  100 mg TID - Adjust B12 supplementation to every other day

## 2024-02-16 ENCOUNTER — Other Ambulatory Visit: Payer: Self-pay | Admitting: Internal Medicine

## 2024-02-16 ENCOUNTER — Encounter: Payer: Self-pay | Admitting: Family Medicine

## 2024-02-16 LAB — LIPID PANEL WITH LDL/HDL RATIO
Cholesterol, Total: 112 mg/dL (ref 100–199)
HDL: 30 mg/dL — ABNORMAL LOW (ref >39)
LDL Chol Calc (NIH): 37 mg/dL (ref 0–99)
LDL/HDL Ratio: 1.2 ratio (ref 0.0–3.6)
Triglycerides: 299 mg/dL — ABNORMAL HIGH (ref 0–149)
VLDL Cholesterol Cal: 45 mg/dL — ABNORMAL HIGH (ref 5–40)

## 2024-02-16 LAB — VITAMIN B12: Vitamin B-12: >2000 pg/mL — ABNORMAL HIGH (ref 232–1245)

## 2024-02-16 LAB — HEMOGLOBIN A1C
Est. average glucose Bld gHb Est-mCnc: 154 mg/dL
Hgb A1c MFr Bld: 7 % — ABNORMAL HIGH (ref 4.8–5.6)

## 2024-02-16 LAB — COMPREHENSIVE METABOLIC PANEL WITH GFR
ALT: 15 IU/L (ref 0–44)
AST: 19 IU/L (ref 0–40)
Albumin: 4.3 g/dL (ref 3.8–4.8)
Alkaline Phosphatase: 90 IU/L (ref 44–121)
BUN/Creatinine Ratio: 15 (ref 10–24)
BUN: 14 mg/dL (ref 8–27)
Bilirubin Total: 0.3 mg/dL (ref 0.0–1.2)
CO2: 22 mmol/L (ref 20–29)
Calcium: 9.4 mg/dL (ref 8.6–10.2)
Chloride: 100 mmol/L (ref 96–106)
Creatinine, Ser: 0.95 mg/dL (ref 0.76–1.27)
Globulin, Total: 3.1 g/dL (ref 1.5–4.5)
Glucose: 166 mg/dL — ABNORMAL HIGH (ref 70–99)
Potassium: 5 mmol/L (ref 3.5–5.2)
Sodium: 139 mmol/L (ref 134–144)
Total Protein: 7.4 g/dL (ref 6.0–8.5)
eGFR: 83 mL/min/{1.73_m2} (ref >59)

## 2024-03-07 ENCOUNTER — Encounter: Payer: Self-pay | Admitting: Ophthalmology

## 2024-03-07 NOTE — Anesthesia Preprocedure Evaluation (Addendum)
 Anesthesia Evaluation  Patient identified by MRN, date of birth, ID band Patient awake    Reviewed: Allergy & Precautions, H&P , NPO status , Patient's Chart, lab work & pertinent test results  History of Anesthesia Complications (+) Family history of anesthesia reaction  Airway Mallampati: III  TM Distance: <3 FB Neck ROM: Full    Dental no notable dental hx. (+) Partial Lower   Pulmonary COPD, former smoker   Pulmonary exam normal breath sounds clear to auscultation       Cardiovascular hypertension, + angina  + CAD and + Peripheral Vascular Disease  Normal cardiovascular exam Rhythm:Regular Rate:Normal     Neuro/Psych   Anxiety     negative neurological ROS  negative psych ROS   GI/Hepatic negative GI ROS, Neg liver ROS,GERD  ,,  Endo/Other  diabetes    Renal/GU Renal diseasenegative Renal ROS  negative genitourinary   Musculoskeletal negative musculoskeletal ROS (+)    Abdominal   Peds negative pediatric ROS (+)  Hematology negative hematology ROS (+) Blood dyscrasia, anemia   Anesthesia Other Findings Patient can lie flat, uses oxygen  at night for "shallow breathing" COPD  (chronic obstructive pulmonary disease), moderate to severe Chronic respiratory failure with hypoxia Diabetes mellitus without complication (HCC) Actinic keratoses  Mixed hyperlipidemia Hypertension  deficiency anemia GERD (gastroesophageal reflux disease) BPH (benign prostatic hyperplasia) Insomnia  Oxygen  deficiency Family history of adverse reaction to anesthesia father died during 76 procedure "fluid up backed into lungs"     Reproductive/Obstetrics negative OB ROS                              Anesthesia Physical Anesthesia Plan  ASA: 3  Anesthesia Plan: MAC   Post-op Pain Management:    Induction: Intravenous  PONV Risk Score and Plan:   Airway Management Planned: Natural Airway and  Nasal Cannula  Additional Equipment:   Intra-op Plan:   Post-operative Plan:   Informed Consent: I have reviewed the patients History and Physical, chart, labs and discussed the procedure including the risks, benefits and alternatives for the proposed anesthesia with the patient or authorized representative who has indicated his/her understanding and acceptance.     Dental Advisory Given  Plan Discussed with: Anesthesiologist, CRNA and Surgeon  Anesthesia Plan Comments: (Patient consented for risks of anesthesia including but not limited to:  - adverse reactions to medications - damage to eyes, teeth, lips or other oral mucosa - nerve damage due to positioning  - sore throat or hoarseness - Damage to heart, brain, nerves, lungs, other parts of body or loss of life  Patient voiced understanding and assent.)         Anesthesia Quick Evaluation

## 2024-03-14 NOTE — Discharge Instructions (Signed)

## 2024-03-15 ENCOUNTER — Ambulatory Visit
Admission: RE | Admit: 2024-03-15 | Discharge: 2024-03-15 | Disposition: A | Discharge status: Home / Self Care | Attending: Ophthalmology | Admitting: Ophthalmology

## 2024-03-15 ENCOUNTER — Encounter: Payer: Self-pay | Admitting: Ophthalmology

## 2024-03-15 ENCOUNTER — Ambulatory Visit: Payer: Self-pay | Admitting: Anesthesiology

## 2024-03-15 ENCOUNTER — Encounter
Admission: RE | Disposition: A | Discharge status: Home / Self Care | Payer: Self-pay | Source: Home / Self Care | Attending: Ophthalmology

## 2024-03-15 ENCOUNTER — Other Ambulatory Visit: Payer: Self-pay

## 2024-03-15 DIAGNOSIS — Z7985 Long-term (current) use of injectable non-insulin antidiabetic drugs: Secondary | ICD-10-CM | POA: Insufficient documentation

## 2024-03-15 DIAGNOSIS — E1136 Type 2 diabetes mellitus with diabetic cataract: Secondary | ICD-10-CM | POA: Diagnosis present

## 2024-03-15 DIAGNOSIS — H5703 Miosis: Secondary | ICD-10-CM | POA: Insufficient documentation

## 2024-03-15 DIAGNOSIS — I25119 Atherosclerotic heart disease of native coronary artery with unspecified angina pectoris: Secondary | ICD-10-CM | POA: Diagnosis not present

## 2024-03-15 DIAGNOSIS — J449 Chronic obstructive pulmonary disease, unspecified: Secondary | ICD-10-CM | POA: Insufficient documentation

## 2024-03-15 DIAGNOSIS — Z87891 Personal history of nicotine dependence: Secondary | ICD-10-CM | POA: Insufficient documentation

## 2024-03-15 DIAGNOSIS — I10 Essential (primary) hypertension: Secondary | ICD-10-CM | POA: Diagnosis not present

## 2024-03-15 DIAGNOSIS — E1121 Type 2 diabetes mellitus with diabetic nephropathy: Secondary | ICD-10-CM | POA: Diagnosis not present

## 2024-03-15 DIAGNOSIS — Z794 Long term (current) use of insulin: Secondary | ICD-10-CM | POA: Diagnosis not present

## 2024-03-15 DIAGNOSIS — H2511 Age-related nuclear cataract, right eye: Secondary | ICD-10-CM | POA: Insufficient documentation

## 2024-03-15 DIAGNOSIS — Z7984 Long term (current) use of oral hypoglycemic drugs: Secondary | ICD-10-CM | POA: Diagnosis not present

## 2024-03-15 DIAGNOSIS — K219 Gastro-esophageal reflux disease without esophagitis: Secondary | ICD-10-CM | POA: Diagnosis not present

## 2024-03-15 DIAGNOSIS — E1151 Type 2 diabetes mellitus with diabetic peripheral angiopathy without gangrene: Secondary | ICD-10-CM | POA: Insufficient documentation

## 2024-03-15 HISTORY — DX: Type 2 diabetes mellitus with diabetic nephropathy: E11.21

## 2024-03-15 HISTORY — DX: Chronic respiratory failure with hypoxia: J96.11

## 2024-03-15 HISTORY — DX: Unspecified chronic bronchitis: J42

## 2024-03-15 HISTORY — DX: Family history of other specified conditions: Z84.89

## 2024-03-15 HISTORY — DX: Anxiety disorder, unspecified: F41.9

## 2024-03-15 HISTORY — DX: Emphysema, unspecified: J43.9

## 2024-03-15 HISTORY — PX: CATARACT EXTRACTION W/PHACO: SHX586

## 2024-03-15 LAB — GLUCOSE, CAPILLARY: Glucose-Capillary: 145 mg/dL — ABNORMAL HIGH (ref 70–99)

## 2024-03-15 SURGERY — PHACOEMULSIFICATION, CATARACT, WITH IOL INSERTION
Anesthesia: Monitor Anesthesia Care | Site: Eye | Laterality: Right

## 2024-03-15 MED ORDER — BRIMONIDINE TARTRATE-TIMOLOL 0.2-0.5 % OP SOLN
OPHTHALMIC | Status: DC | PRN
Start: 2024-03-15 — End: 2024-03-15
  Administered 2024-03-15: 1 [drp] via OPHTHALMIC

## 2024-03-15 MED ORDER — LIDOCAINE HCL (PF) 2 % IJ SOLN
INTRAOCULAR | Status: DC | PRN
  Administered 2024-03-15: 1 mL

## 2024-03-15 MED ORDER — ARMC OPHTHALMIC DILATING DROPS
1.0000 | OPHTHALMIC | Status: DC | PRN
  Administered 2024-03-15 (×3): 1 via OPHTHALMIC

## 2024-03-15 MED ORDER — ARMC OPHTHALMIC DILATING DROPS
OPHTHALMIC | Status: AC
  Filled 2024-03-15: qty 0.5

## 2024-03-15 MED ORDER — TETRACAINE HCL 0.5 % OP SOLN
1.0000 [drp] | OPHTHALMIC | Status: DC | PRN
  Administered 2024-03-15 (×3): 1 [drp] via OPHTHALMIC

## 2024-03-15 MED ORDER — MIDAZOLAM HCL 2 MG/2ML IJ SOLN
INTRAMUSCULAR | Status: DC | PRN
  Administered 2024-03-15 (×2): 1 mg via INTRAVENOUS

## 2024-03-15 MED ORDER — DEXMEDETOMIDINE HCL IN NACL 200 MCG/50ML IV SOLN
INTRAVENOUS | Status: DC | PRN
  Administered 2024-03-15: 8 ug via INTRAVENOUS

## 2024-03-15 MED ORDER — FENTANYL CITRATE (PF) 100 MCG/2ML IJ SOLN
INTRAMUSCULAR | Status: DC | PRN
  Administered 2024-03-15: 50 ug via INTRAVENOUS
  Administered 2024-03-15 (×2): 25 ug via INTRAVENOUS

## 2024-03-15 MED ORDER — SIGHTPATH DOSE#1 BSS IO SOLN
INTRAOCULAR | Status: DC | PRN
  Administered 2024-03-15: 15 mL via INTRAOCULAR

## 2024-03-15 MED ORDER — MIDAZOLAM HCL 2 MG/2ML IJ SOLN
INTRAMUSCULAR | Status: AC
  Filled 2024-03-15: qty 2

## 2024-03-15 MED ORDER — FENTANYL CITRATE (PF) 100 MCG/2ML IJ SOLN
INTRAMUSCULAR | Status: AC
Start: 2024-03-15 — End: ?
  Filled 2024-03-15: qty 2

## 2024-03-15 MED ORDER — TETRACAINE HCL 0.5 % OP SOLN
OPHTHALMIC | Status: AC
  Filled 2024-03-15: qty 4

## 2024-03-15 MED ORDER — SIGHTPATH DOSE#1 BSS IO SOLN
INTRAOCULAR | Status: DC | PRN
  Administered 2024-03-15: 46 mL via OPHTHALMIC

## 2024-03-15 MED ORDER — SIGHTPATH DOSE#1 NA HYALUR & NA CHOND-NA HYALUR IO KIT
PACK | INTRAOCULAR | Status: DC | PRN
  Administered 2024-03-15: 1 via OPHTHALMIC

## 2024-03-15 MED ORDER — SODIUM CHLORIDE 0.9% FLUSH
INTRAVENOUS | Status: DC | PRN
  Administered 2024-03-15 (×3): 10 mL via INTRAVENOUS

## 2024-03-15 MED ORDER — CEFUROXIME OPHTHALMIC INJECTION 1 MG/0.1 ML
INJECTION | OPHTHALMIC | Status: DC | PRN
  Administered 2024-03-15: .1 mL via INTRACAMERAL

## 2024-03-15 SURGICAL SUPPLY — 12 items
CATARACT SUITE SIGHTPATH (MISCELLANEOUS) ×1 IMPLANT
FEE CATARACT SUITE SIGHTPATH (MISCELLANEOUS) ×1 IMPLANT
GLOVE BIOGEL PI IND STRL 8 (GLOVE) ×1 IMPLANT
GLOVE SURG LX STRL 7.5 STRW (GLOVE) ×1 IMPLANT
GLOVE SURG PROTEXIS BL SZ6.5 (GLOVE) ×1 IMPLANT
GLOVE SURG SYN 6.5 PF PI BL (GLOVE) ×1 IMPLANT
LENS IOL TECNIS EYHANCE 20.0 (Intraocular Lens) IMPLANT
NDL FILTER BLUNT 18X1 1/2 (NEEDLE) ×1 IMPLANT
NDL RETROBULBAR .5 NSTRL (NEEDLE) IMPLANT
NEEDLE FILTER BLUNT 18X1 1/2 (NEEDLE) ×1 IMPLANT
RING MALYGIN 7.0 (MISCELLANEOUS) IMPLANT
SYR 3ML LL SCALE MARK (SYRINGE) ×1 IMPLANT

## 2024-03-15 NOTE — Transfer of Care (Signed)
 Immediate Anesthesia Transfer of Care Note  Patient: Billy Wise  Procedure(s) Performed: PHACOEMULSIFICATION, CATARACT, WITH IOL INSERTION 4.85, 00.27.6 (Right: Eye)  Patient Location: PACU  Anesthesia Type:MAC  Level of Consciousness: awake and alert   Airway & Oxygen  Therapy: Patient Spontanous Breathing  Post-op Assessment: Report given to RN and Post -op Vital signs reviewed and stable  Post vital signs: Reviewed and stable  Last Vitals:  Vitals Value Taken Time  BP 104/71 03/15/24 0921  Temp 36.1 C 03/15/24 0921  Pulse 92 03/15/24 0922  Resp 16 03/15/24 0922  SpO2 89 % 03/15/24 0922  Vitals shown include unfiled device data.  Last Pain:  Vitals:   03/15/24 0921  TempSrc:   PainSc: 0-No pain         Complications: No notable events documented.

## 2024-03-15 NOTE — Anesthesia Postprocedure Evaluation (Signed)
 Anesthesia Post Note  Patient: JOSEDANIEL HAYE  Procedure(s) Performed: PHACOEMULSIFICATION, CATARACT, WITH IOL INSERTION 4.85, 00.27.6 (Right: Eye)  Patient location during evaluation: PACU Anesthesia Type: MAC Level of consciousness: awake and alert Pain management: pain level controlled Vital Signs Assessment: post-procedure vital signs reviewed and stable Respiratory status: spontaneous breathing, nonlabored ventilation, respiratory function stable and patient connected to nasal cannula oxygen  Cardiovascular status: stable and blood pressure returned to baseline Postop Assessment: no apparent nausea or vomiting Anesthetic complications: no   No notable events documented.   Last Vitals:  Vitals:   03/15/24 0921 03/15/24 0927  BP: 104/71 102/69  Pulse: 95 91  Resp: 14 14  Temp: (!) 36.1 C   SpO2: 90% 91%    Last Pain:  Vitals:   03/15/24 0927  TempSrc:   PainSc: 0-No pain                 Emilie Harden

## 2024-03-15 NOTE — Op Note (Signed)
 OPERATIVE NOTE  MAICOL BOWLAND [id number] 03/15/2024   PREOPERATIVE DIAGNOSIS:    Nuclear Sclerotic Cataract Right eye with miotic pupil.        H25.11  POSTOPERATIVE DIAGNOSIS: Nuclear Sclerotic Cataract Right eye with miotic pupil.          PROCEDURE:  Phacoemusification with posterior chamber intraocular lens placement of the right eye which required pupil stretching with the Malyugin pupil expansion device. Ultrasound time: Procedure(s): PHACOEMULSIFICATION, CATARACT, WITH IOL INSERTION 4.85, 00.27.6 (Right)  LENS:   Implant Name Type Inv. Item Serial No. Manufacturer Lot No. LRB No. Used Action  LENS IOL TECNIS EYHANCE 20.0 - [id number] Intraocular Lens LENS IOL TECNIS EYHANCE 20.0 [id number] [employer]  Right 1 Implanted     SURGEON:  Berline Brenner, MD   ANESTHESIA:  Topical with tetracaine drops and 2% Xylocaine jelly, augmented with 1% preservative-free intracameral lidocaine.   COMPLICATIONS:  None.   DESCRIPTION OF PROCEDURE:  The patient was identified in the holding room and transported to the operating room and placed in the supine position under the operating microscope. The right eye was identified as the operative eye and it was prepped and draped in the usual sterile ophthalmic fashion.   A 1 millimeter clear-corneal paracentesis was made at the 12:00 position.  0.5 ml of preservative-free 1% lidocaine was injected into the anterior chamber. The anterior chamber was filled with Viscoat viscoelastic.  A 2.4 millimeter keratome was used to make a near-clear corneal incision at the 9:00 position. A Malyugin pupil expander was then placed through the main incision and into the anterior chamber of the eye.  The edge of the iris was secured on the lip of the pupil expander and it was released, thereby expanding the pupil to approximately 7 millimeters for completion of the cataract surgery.  Additional Viscoat was placed in the anterior chamber.  A cystotome and  capsulorrhexis forceps were used to make a curvilinear capsulorrhexis.   Balanced salt solution was used to hydrodissect and hydrodelineate the lens nucleus.   Phacoemulsification was used in stop and chop fashion to remove the lens, nucleus and epinucleus.  The remaining cortex was aspirated using the irrigation aspiration handpiece.  Additional Provisc was placed into the eye to distend the capsular bag for lens placement.  A lens was then injected into the capsular bag.  The pupil expanding ring was removed using a Kuglen hook and insertion device. The remaining viscoelastic was aspirated from the capsular bag and the anterior chamber.  The anterior chamber was filled with balanced salt solution to inflate to a physiologic pressure.  Wounds were hydrated with balanced salt solution.  The anterior chamber was inflated to a physiologic pressure with balanced salt solution.  No wound leaks were noted.Cefuroxime 0.1 ml of a 10mg /ml solution was injected into the anterior chamber for a dose of 1 mg of intracameral antibiotic at the completion of the case. Timolol and Brimonidine drops were applied to the eye.  The patient was taken to the recovery room in stable condition without complications of anesthesia or surgery.  Meleny Tregoning 03/15/2024, 9:18 AM

## 2024-03-15 NOTE — H&P (Signed)
 [facility]   Primary Care Physician:  Mazie Speed, MD Ophthalmologist: Dr. Annell Kidney  Pre-Procedure History & Physical: HPI:  Billy Wise is a 78 y.o. male here for ophthalmic surgery.   Past Medical History:  Diagnosis Date   Actinic keratoses    Anxiety    BPH (benign prostatic hyperplasia)    Chronic bronchitis (HCC)    Chronic respiratory failure with hypoxia (HCC)    COPD (chronic obstructive pulmonary disease) (HCC)    Emphysema lung (HCC)    Family history of adverse reaction to anesthesia    Father died during procedure April 12, 1989).  "Fluid backed up in his lungs"   GERD (gastroesophageal reflux disease)    Hypertension    Insomnia    Iron deficiency anemia    Mixed hyperlipidemia    Oxygen  deficiency    Type 2 diabetes mellitus with diabetic nephropathy (HCC)     Past Surgical History:  Procedure Laterality Date   CARDIAC CATHETERIZATION     CHOLECYSTECTOMY     COLONOSCOPY WITH PROPOFOL  N/A 10/18/2018   Procedure: COLONOSCOPY WITH PROPOFOL ;  Surgeon: Marnee Sink, MD;  Location: [facility] ENDOSCOPY;  Service: Endoscopy;  Laterality: N/A;   CORONARY PRESSURE/FFR STUDY N/A 12/29/2022   Procedure: INTRAVASCULAR PRESSURE WIRE/FFR STUDY;  Surgeon: Sammy Crisp, MD;  Location: [facility] INVASIVE CV LAB;  Service: Cardiovascular;  Laterality: N/A;   HERNIA REPAIR     RIGHT/LEFT HEART CATH AND CORONARY ANGIOGRAPHY Bilateral 12/29/2022   Procedure: RIGHT/LEFT HEART CATH AND CORONARY ANGIOGRAPHY;  Surgeon: Sammy Crisp, MD;  Location: [facility] INVASIVE CV LAB;  Service: Cardiovascular;  Laterality: Bilateral;    Prior to Admission medications   Medication Sig Start Date End Date Taking? Authorizing Provider  albuterol  (PROVENTIL ) (2.5 MG/3ML) 0.083% nebulizer solution Take 3 mLs (2.5 mg total) by nebulization every 6 (six) hours as needed for wheezing or shortness of breath. 07/15/23  Yes Marc Senior, MD  albuterol  (VENTOLIN  HFA) 108 (90 Base)  MCG/ACT inhaler INHALE 2 PUFFS BY MOUTH EVERY 6 HOURS AS NEEDED FOR WHEEZING AND SHORTNESS OF BREATH 11/30/23  Yes Marc Senior, MD  amLODipine  (NORVASC ) 5 MG tablet TAKE 1 TABLET(5 MG) BY MOUTH DAILY 02/14/24  Yes Bacigalupo, Angela M, MD  ascorbic acid (VITAMIN C) 1000 MG tablet Take 1 tablet by mouth daily.   Yes [provider]  benzonatate  (TESSALON ) 100 MG capsule Take 1 capsule (100 mg total) by mouth 2 (two) times daily as needed for cough. 09/01/23  Yes Pardue, Asencion Blacksmith, DO  Blood Glucose Monitoring Suppl (ACCU-CHEK AVIVA PLUS) w/Device KIT Use to check blood sugar twice a day.  DX E11.9 02/07/20  Yes Bacigalupo, Stan Eans, MD  BREZTRI  AEROSPHERE 160-9-4.8 MCG/ACT AERO INHALE 2 PUFFS INTO THE LUNGS IN THE MORNING AND AT BEDTIME 11/29/23  Yes Marc Senior, MD  dapagliflozin  propanediol (FARXIGA ) 10 MG TABS tablet Take 1 tablet (10 mg total) by mouth daily. 11/16/22  Yes Bacigalupo, Stan Eans, MD  Ensifentrine  (OHTUVAYRE ) 3 MG/2.5ML SUSP Inhale 2.5 mLs into the lungs in the morning and at bedtime. **refills to be sent to [facility]** 10/06/23  Yes Marc Senior, MD  finasteride  (PROSCAR ) 5 MG tablet TAKE 1 TABLET(5 MG) BY MOUTH DAILY 02/14/24  Yes Bacigalupo, Stan Eans, MD  fluticasone  (FLONASE ) 50 MCG/ACT nasal spray Place 2 sprays into both nostrils daily. 09/22/23  Yes Rufus Council A, FNP  gabapentin  (NEURONTIN ) 100 MG capsule TAKE 1 CAPSULE(100 MG) BY MOUTH THREE TIMES DAILY 01/04/24  Yes Clifton, Kellie A, FNP  gentamicin ointment (GARAMYCIN) 0.1 % Apply 1 Application topically 3 (three) times daily. 10/07/23  Yes [provider]  hydrOXYzine  (ATARAX ) 25 MG tablet Take 1 tablet (25 mg total) by mouth 3 (three) times daily. 09/22/23  Yes Tasia Farr, FNP  insulin  glargine (LANTUS  SOLOSTAR) 100 UNIT/ML Solostar Pen Inject 32 Units into the skin at bedtime. 02/15/24  Yes Bacigalupo, Stan Eans, MD  insulin  lispro (HUMALOG) 100 UNIT/ML KwikPen  Junior 12 units at breakfast, 10 units at lunch and 12 units at supper time 04/16/21  Yes [provider]  isosorbide  mononitrate (IMDUR ) 30 MG 24 hr tablet TAKE 1/2 TABLET(15 MG) BY MOUTH DAILY 02/17/24  Yes End, Veryl Gottron, MD  Lactobacillus (PROBIOTIC ACIDOPHILUS PO) Take 1 capsule by mouth daily.    Yes [provider]  metFORMIN  (GLUCOPHAGE ) 1000 MG tablet TAKE 1 TABLET(1000 MG) BY MOUTH TWICE DAILY 02/14/24  Yes Bacigalupo, Stan Eans, MD  nitroGLYCERIN  (NITROSTAT ) 0.4 MG SL tablet Place 1 tablet (0.4 mg total) under the tongue every 5 (five) minutes as needed for chest pain. 12/29/22 03/07/24 Yes End, Veryl Gottron, MD  omeprazole  (PRILOSEC) 20 MG capsule TAKE 1 CAPSULE(20 MG) BY MOUTH DAILY 11/11/23  Yes Bacigalupo, Angela M, MD  oxybutynin  (DITROPAN -XL) 10 MG 24 hr tablet TAKE 1 TABLET(10 MG) BY MOUTH DAILY 02/09/24  Yes Bacigalupo, Stan Eans, MD  ramipril  (ALTACE ) 5 MG capsule TAKE 2 CAPSULES BY MOUTH EVERY MORNING AND 1 CAPSULE EVERY NIGHT AT BEDTIME 09/30/23  Yes Bacigalupo, Stan Eans, MD  rosuvastatin  (CRESTOR ) 10 MG tablet TAKE 1 TABLET(10 MG) BY MOUTH DAILY 01/04/24  Yes End, Veryl Gottron, MD  tamsulosin  (FLOMAX ) 0.4 MG CAPS capsule TAKE 2 CAPSULES(0.8 MG) BY MOUTH DAILY 08/19/23  Yes Bacigalupo, Stan Eans, MD  TRULICITY  0.75 MG/0.5ML SOAJ Inject 0.75 mg into the skin once a week.   Yes [provider]  zolpidem  (AMBIEN ) 10 MG tablet TAKE 1 TABLET(10 MG) BY MOUTH AT BEDTIME 10/07/23  Yes Bacigalupo, Angela M, MD  Accu-Chek Softclix Lancets lancets Use to check blood sugar twice a day. DX E11.9 11/12/20   Mazie Speed, MD  Continuous Glucose Sensor (FREESTYLE LIBRE 3 PLUS SENSOR) MISC by Does not apply route. Change sensor every 15 days.    [provider]  glucose blood (ACCU-CHEK GUIDE) test strip Use as instructed 12/16/20   Bacigalupo, Angela M, MD  Insulin  Pen Needle (PEN NEEDLES) 31G X 5 MM MISC 1 each by Does not apply route daily as needed. 04/19/19    Mazie Speed, MD  Spacer/Aero-Holding Chambers (AEROCHAMBER PLS FLOVU MTHPIECE) DEVI Use as directed 09/10/23   Marc Senior, MD    Allergies as of 02/18/2024   (No Known Allergies)    Family History  Problem Relation Age of Onset   Heart attack Sister    Hypertension Sister    Gout Father     Social History   Socioeconomic History   Marital status: Married    Spouse name: Not on file   Number of children: 2   Years of education: Not on file   Highest education level: 12th grade  Occupational History   Occupation: Economist of [employer]    Comment: full time  Tobacco Use   Smoking status: Former    Current packs/day: 0.00    Average packs/day: 1.5 packs/day for 50.0 years (75.0 ttl pk-yrs)    Types: Cigarettes    Start date: 09/29/1958    Quit date: 09/29/2008  Years since quitting: 15.4   Smokeless tobacco: Never  Vaping Use   Vaping status: Never Used  Substance and Sexual Activity   Alcohol use: Not Currently    Comment: 2 drinks a month / cocktail   Drug use: No   Sexual activity: Not Currently    Partners: Female    Birth control/protection: None  Other Topics Concern   Not on file  Social History Narrative   Not on file   Social Drivers of Health   Financial Resource Strain: Low Risk  (02/16/2024)   Received from [facility]   Overall Financial Resource Strain (CARDIA)    Difficulty of Paying Living Expenses: Not hard at all  Food Insecurity: No Food Insecurity (02/16/2024)   Received from [facility]   Hunger Vital Sign    Worried About Running Out of Food in the Last Year: Never true    Ran Out of Food in the Last Year: Never true  Transportation Needs: No Transportation Needs (02/16/2024)   Received from [facility] - Transportation    Lack of Transportation (Medical): No    Lack of Transportation (Non-Medical): No  Physical Activity: Insufficiently Active (02/16/2024)   Received from [facility]   Exercise Vital  Sign    Days of Exercise per Week: 2 days    Minutes of Exercise per Session: 10 min  Stress: No Stress Concern Present (02/16/2024)   Received from [facility] - Occupational Stress Questionnaire    Feeling of Stress : Not at all  Social Connections: Socially Integrated (02/16/2024)   Received from [facility]   Social Network    How would you rate your social network (family, work, friends)?: Good participation with social networks  Intimate Partner Violence: Not At Risk (02/16/2024)   Received from [facility]   HITS    Over the last 12 months how often did your partner physically hurt you?: Never    Over the last 12 months how often did your partner insult you or talk down to you?: Never    Over the last 12 months how often did your partner threaten you with physical harm?: Never    Over the last 12 months how often did your partner scream or curse at you?: Never    Review of Systems: See HPI, otherwise negative ROS  Physical Exam: Ht 5\' 4"  (1.626 m)   Wt 80.7 kg   BMI 30.55 kg/m  General:   Alert,  pleasant and cooperative in NAD Head:  Normocephalic and atraumatic. Lungs:  Clear to auscultation.    Heart:  Regular rate and rhythm.   Impression/Plan: Billy Wise is here for ophthalmic surgery.  Risks, benefits, limitations, and alternatives regarding ophthalmic surgery have been reviewed with the patient.  Questions have been answered.  All parties agreeable.   Annell Kidney, MD  03/15/2024, 8:06 AM

## 2024-03-16 ENCOUNTER — Encounter: Payer: Self-pay | Admitting: Ophthalmology

## 2024-03-17 ENCOUNTER — Other Ambulatory Visit: Payer: Self-pay | Admitting: Family Medicine

## 2024-03-17 DIAGNOSIS — E1159 Type 2 diabetes mellitus with other circulatory complications: Secondary | ICD-10-CM

## 2024-03-20 ENCOUNTER — Other Ambulatory Visit: Payer: Self-pay | Admitting: Family Medicine

## 2024-03-20 NOTE — Anesthesia Preprocedure Evaluation (Addendum)
 Anesthesia Evaluation  Patient identified by MRN, date of birth, ID band Patient awake    Reviewed: Allergy & Precautions, H&P , NPO status , Patient's Chart, lab work & pertinent test results  Airway Mallampati: III  TM Distance: <3 FB Neck ROM: Full    Dental no notable dental hx. (+) Partial Lower   Pulmonary neg pulmonary ROS, former smoker   Pulmonary exam normal breath sounds clear to auscultation       Cardiovascular hypertension, negative cardio ROS Normal cardiovascular exam Rhythm:Regular Rate:Normal     Neuro/Psych negative neurological ROS  negative psych ROS   GI/Hepatic negative GI ROS, Neg liver ROS,,,  Endo/Other  negative endocrine ROSdiabetes    Renal/GU negative Renal ROS  negative genitourinary   Musculoskeletal negative musculoskeletal ROS (+)    Abdominal   Peds negative pediatric ROS (+)  Hematology negative hematology ROS (+)   Anesthesia Other Findings Previous cataract surgery 03-15-24 Dr. Aldo Amble   Patient can lie flat, uses oxygen  at night for shallow breathing COPD  (chronic obstructive pulmonary disease), moderate to severe Chronic respiratory failure with hypoxia Diabetes mellitus without complication (HCC) Actinic keratoses        Mixed hyperlipidemia Hypertension             deficiency anemia GERD (gastroesophageal reflux disease) BPH (benign prostatic hyperplasia) Insomnia             Oxygen  deficiency Family history of adverse reaction to anesthesia father died during 42 procedure fluid up backed into lungs     Reproductive/Obstetrics negative OB ROS                             Anesthesia Physical Anesthesia Plan  ASA: 3  Anesthesia Plan: MAC   Post-op Pain Management:    Induction: Intravenous  PONV Risk Score and Plan:   Airway Management Planned: Natural Airway and Nasal Cannula  Additional Equipment:   Intra-op Plan:    Post-operative Plan:   Informed Consent: I have reviewed the patients History and Physical, chart, labs and discussed the procedure including the risks, benefits and alternatives for the proposed anesthesia with the patient or authorized representative who has indicated his/her understanding and acceptance.     Dental Advisory Given  Plan Discussed with: Anesthesiologist, CRNA and Surgeon  Anesthesia Plan Comments: (Patient consented for risks of anesthesia including but not limited to:  - adverse reactions to medications - damage to eyes, teeth, lips or other oral mucosa - nerve damage due to positioning  - sore throat or hoarseness - Damage to heart, brain, nerves, lungs, other parts of body or loss of life  Patient voiced understanding and assent.)        Anesthesia Quick Evaluation

## 2024-03-21 NOTE — Telephone Encounter (Signed)
 Requested medications are due for refill today.  yes  Requested medications are on the active medications list.  yes  Last refill. 10/07/2023 #90 1 rf  Future visit scheduled.   yes  Notes to clinic.  Refill not delegated.    Requested Prescriptions  Pending Prescriptions Disp Refills   zolpidem  (AMBIEN ) 10 MG tablet [Pharmacy Med Name: ZOLPIDEM  10MG  TABLETS] 90 tablet     Sig: TAKE 1 TABLET(10 MG) BY MOUTH AT BEDTIME     Not Delegated - Psychiatry:  Anxiolytics/Hypnotics Failed - 03/21/2024  4:18 PM      Failed - This refill cannot be delegated      Failed - Urine Drug Screen completed in last 360 days      Passed - Valid encounter within last 6 months    Recent Outpatient Visits           1 month ago Diabetes mellitus with proteinuria [facility])   [place] [facility] Bacigalupo, Stan Eans, MD       Future Appointments             In 4 months Bacigalupo, Stan Eans, MD [facility]

## 2024-03-22 ENCOUNTER — Other Ambulatory Visit: Payer: Self-pay

## 2024-03-22 MED ORDER — ZOLPIDEM TARTRATE 10 MG PO TABS: 10.0000 mg | ORAL_TABLET | Freq: Every day | ORAL | 0 refills | Status: DC

## 2024-03-23 MED ORDER — COMPRESSOR/NEBULIZER MISC: 11 refills | Status: AC

## 2024-03-23 NOTE — Telephone Encounter (Signed)
 Copied from CRM [id number]. Topic: Clinical - Order For Equipment >> Mar 23, 2024 11:36 AM Ambrose Junk wrote: Reason for CRM: Phil from [employer].  [facility] is in need of nebulizer supplies. [facility] is requesting a script for these supplies.

## 2024-03-23 NOTE — Telephone Encounter (Signed)
 Script has been sent.  Nothing further needed.

## 2024-03-28 ENCOUNTER — Encounter: Payer: Self-pay | Admitting: Pulmonary Disease

## 2024-03-28 ENCOUNTER — Ambulatory Visit: Admitting: Pulmonary Disease

## 2024-03-28 VITALS — BP 118/82 | HR 91 | Temp 97.1°F | Ht 65.0 in | Wt 183.0 lb

## 2024-03-28 DIAGNOSIS — J9611 Chronic respiratory failure with hypoxia: Secondary | ICD-10-CM | POA: Diagnosis not present

## 2024-03-28 DIAGNOSIS — R0602 Shortness of breath: Secondary | ICD-10-CM

## 2024-03-28 DIAGNOSIS — J449 Chronic obstructive pulmonary disease, unspecified: Secondary | ICD-10-CM

## 2024-03-28 DIAGNOSIS — Z87891 Personal history of nicotine dependence: Secondary | ICD-10-CM

## 2024-03-28 LAB — PULMONARY FUNCTION TEST
DL/VA % pred: 55 %
DL/VA: 2.23 ml/min/mmHg/L
DLCO unc % pred: 51 %
DLCO unc: 10.95 ml/min/mmHg
FEF 25-75 Post: 0.64 L/s
FEF 25-75 Pre: 0.56 L/s
FEF2575-%Change-Post: 14 %
FEF2575-%Pred-Post: 36 %
FEF2575-%Pred-Pre: 31 %
FEV1-%Change-Post: 0 %
FEV1-%Pred-Post: 57 %
FEV1-%Pred-Pre: 58 %
FEV1-Post: 1.41 L
FEV1-Pre: 1.42 L
FEV1FVC-%Change-Post: -4 %
FEV1FVC-%Pred-Pre: 69 %
FEV6-%Change-Post: 2 %
FEV6-%Pred-Post: 87 %
FEV6-%Pred-Pre: 85 %
FEV6-Post: 2.79 L
FEV6-Pre: 2.73 L
FEV6FVC-%Change-Post: -1 %
FEV6FVC-%Pred-Post: 102 %
FEV6FVC-%Pred-Pre: 104 %
FVC-%Change-Post: 4 %
FVC-%Pred-Post: 85 %
FVC-%Pred-Pre: 82 %
FVC-Post: 2.92 L
FVC-Pre: 2.81 L
Post FEV1/FVC ratio: 48 %
Post FEV6/FVC ratio: 95 %
Pre FEV1/FVC ratio: 51 %
Pre FEV6/FVC Ratio: 97 %
RV % pred: 138 %
RV: 3.16 L
TLC % pred: 107 %
TLC: 6.47 L

## 2024-03-28 MED ORDER — OHTUVAYRE 3 MG/2.5ML IN SUSP: 2.5000 mL | Freq: Two times a day (BID) | RESPIRATORY_TRACT | Status: DC

## 2024-03-28 NOTE — Patient Instructions (Signed)
 VISIT SUMMARY:  Today, you had a follow-up appointment to check on your chronic obstructive pulmonary disease (COPD) and chronic hypoxic respiratory failure. You mentioned that you are doing okay with your nebulized medication, and your oxygen  levels were measured at 91-92% this morning. You also shared that your oxygen  levels tend to drop when you nap due to shallow breathing. You use oxygen  at night at a rate of two liters and do not use CPAP.  YOUR PLAN:  -CHRONIC OBSTRUCTIVE PULMONARY DISEASE (COPD): COPD is a chronic lung condition that makes it hard to breathe. Your spirometry test shows a 7% improvement since June 2024. You should continue using your nebulized medication as it is effective. All necessary medications will be refilled.  -CHRONIC RESPIRATORY FAILURE WITH HYPOXIA: Chronic respiratory failure with hypoxia means your body is not getting enough oxygen . This is managed with oxygen  therapy at night. Your oxygen  saturation was 92% during the visit, which is consistent with previous measurements. You should continue using nocturnal oxygen  therapy at 2 liters.  INSTRUCTIONS:  Please continue using your nebulized medication and nocturnal oxygen  therapy as discussed. Refill all necessary medications. Follow up with us  if you experience any changes in your symptoms or have any concerns.

## 2024-03-28 NOTE — Progress Notes (Signed)
 Subjective:    Patient ID: Billy Wise, male    DOB: 06-13-1946, 78 y.o.   MRN: [id number]  Patient Care Team: Mazie Speed, MD as PCP - General (Family Medicine) Hays Lipschutz, MD as Referring Physician (Endocrinology) Gordon Latus, MD as Referring Physician (Dermatology) Annell Kidney, MD as Referring Physician (Ophthalmology) Marc Senior, MD as Consulting Physician (Pulmonary Disease)  Chief Complaint  Patient presents with   Follow-up    No SOB, wheezing or cough.    BACKGROUND/INTERVAL:Billy Wise is a 78 year old former smoker who follows here for the issue of COPD with chronic hypoxic respiratory failure due to the same.  He was last seen on a follow-up visit on 26 January 2024.  He was started on Ohtuvayre  on 19 August 2023.  He has participated in pulmonary rehab previously.   HPI Discussed the use of AI scribe software for clinical note transcription with the patient, who gave verbal consent to proceed.  History of Present Illness   Billy Wise "Billy Wise" is a 78 year old male with COPD and chronic hypoxic respiratory failure who presents for follow-up.  He follows up for his chronic obstructive pulmonary disease (COPD) and chronic hypoxic respiratory failure. He uses Ohtuvayre  twice a day and states he is doing well with it.  He also is compliant with Breztri  twice a day.  His oxygen  saturation was measured at 91-92% this morning, but it tends to drop when he naps due to shallow breathing. He does not use CPAP but uses oxygen  at night at a rate of two liters.  He recalls a sleep test conducted many years ago, where it was determined that he did not need CPAP but required oxygen  due to shallow breathing. He started using oxygen  in 2019. He expresses difficulty with breathing, stating, 'I don't know how to breathe. I practice trying to breathe, but I don't breathe good.'  Overall though he feels that the Breztri  and Ohtuvayre  have  improved his dyspnea significantly.     DATA: CBC 10/28/17>>eosinophils equals 0. Sleep Study 11/27/17>> no sleep apnea.  Nocturnal desaturations noted. Desat walk 11/15/17>> baseline sat on RA at rest was 91% and HR 89. Walked 360 feet at a very brisk pace, was conversational. Sat was 88% and HR 112.  Moderate dyspnea. CT chest personally reviewed>> CT hi-res  02/01/18; chest x-ray 10/28/17; there is mild basilar fibrosis, bronchiectatic changes with bronchial thickening, severe apical emphysema. Findings not likely due to UIP.  PFT tracings personally reviewed from 02/01/18>> FVC is 86% predicted, FEV1 is 67% predicted, there is no improvement with bronchodilator.  Ratio 61%. DLCO is reduced at 52%.Flow volume loop is obstructive. Ambulatory oximetry 07/19/2020: Desaturations with exercise, initiated oxygen  at 2 L/min 2D echocardiogram 07/26/2020: LVEF 60 to 65%, grade 1 DD, moderate elevation on pulmonary artery systolic pressure. PFTs 07/31/2020: FEV1 1.57 L or 57% predicted, FVC 2.79 L or 74% predicted.  FEV1/FVC 56%.  Postbronchodilator there is a 10% change in FEV1 lung volumes showed mild air trapping no hyperinflation, diffusion capacity moderately reduced.  Consistent with COPD/emphysema.  Flow volume loop is delayed consistent with obstructive lung disease. Alpha 1 antitrypsin 12/15/2022: MM phenotype, level 128 mg/dL PFTs 40/98/1191: FEV1 4.78 L or 51% predicted, FVC 2.55 L or 73% predicted, FEV1/FVC 51%, no significant bronchodilator response.  Lung volumes show air trapping.  Flow volume loop is consistent with obstruction.  Diffusion capacity is severely impaired.  This is consistent with COPD on the basis of  emphysema.  As the prior study of 31 July 2020 there has been minimal decline in function. CT chest low-dose 05/04/2023: Lung RADS 2 benign appearance and behavior.  Bilateral lower lobe cylindrical bronchiectasis.  Moderate emphysema.  Prior pulmonary nodules resolve indicating  inflammatory/infectious cause. Chest x-ray PA and lateral 07/13/2023: No active cardiopulmonary disease.  Coarsened interstitial markings. PFTs 03/28/2024: FEV1 1.42 L or 58% predicted, FVC 2.81 L or 82% predicted, FEV1/FVC 51% no bronchodilator response, lung volumes show mild hyperinflation and air trapping.  Diffusion capacity moderately reduced.  Overall modest improvement from 6/24 PFTs.  Consistent with COPD on the basis of emphysema.  Review of Systems A 10 point review of systems was performed and it is as noted above otherwise negative.   Patient Active Problem List   Diagnosis Date Noted   Numbness and tingling of both feet 09/22/2023   Chronic rhinosinusitis 09/22/2023   Type 2 diabetes mellitus with diabetic neuropathy (HCC) 09/22/2023   Anxiety 07/31/2023   Accelerating angina (HCC) 12/29/2022   Coronary artery disease of native artery of native heart with stable angina pectoris (HCC) 11/05/2022   Aortic atherosclerosis (HCC) 11/05/2022   Senile purpura (HCC) 08/14/2022   Diabetes mellitus with proteinuria (HCC) 08/14/2022   Chronic hypoxic respiratory failure, on home oxygen  therapy (HCC) 08/07/2021   Venous stasis 08/07/2021   Other secondary pulmonary hypertension (HCC) 07/26/2020   Diastolic dysfunction 07/26/2020   Obesity 10/25/2019   History of anemia 04/20/2019   Gastroesophageal reflux disease without esophagitis 09/09/2018   Hyperlipidemia associated with type 2 diabetes mellitus (HCC) 09/09/2018   COPD (chronic obstructive pulmonary disease) (HCC) 10/28/2017   Hypertension associated with diabetes (HCC) 10/28/2017   Insomnia 09/03/2016   BPH with obstruction/lower urinary tract symptoms 12/16/2015    Social History   Tobacco Use   Smoking status: Former    Current packs/day: 0.00    Average packs/day: 1.5 packs/day for 50.0 years (75.0 ttl pk-yrs)    Types: Cigarettes    Start date: 09/29/1958    Quit date: 09/29/2008    Years since quitting: 15.5    Smokeless tobacco: Never  Substance Use Topics   Alcohol use: Not Currently    Comment: 2 drinks a month / cocktail    No Known Allergies  Current Meds  Medication Sig   Accu-Chek Softclix Lancets lancets Use to check blood sugar twice a day. DX E11.9   albuterol  (PROVENTIL ) (2.5 MG/3ML) 0.083% nebulizer solution Take 3 mLs (2.5 mg total) by nebulization every 6 (six) hours as needed for wheezing or shortness of breath.   albuterol  (VENTOLIN  HFA) 108 (90 Base) MCG/ACT inhaler INHALE 2 PUFFS BY MOUTH EVERY 6 HOURS AS NEEDED FOR WHEEZING AND SHORTNESS OF BREATH   amLODipine  (NORVASC ) 5 MG tablet TAKE 1 TABLET(5 MG) BY MOUTH DAILY   ascorbic acid (VITAMIN C) 1000 MG tablet Take 1 tablet by mouth daily.   benzonatate  (TESSALON ) 100 MG capsule Take 1 capsule (100 mg total) by mouth 2 (two) times daily as needed for cough.   Blood Glucose Monitoring Suppl (ACCU-CHEK AVIVA PLUS) w/Device KIT Use to check blood sugar twice a day.  DX E11.9   BREZTRI  AEROSPHERE 160-9-4.8 MCG/ACT AERO INHALE 2 PUFFS INTO THE LUNGS IN THE MORNING AND AT BEDTIME   Continuous Glucose Sensor (FREESTYLE LIBRE 3 PLUS SENSOR) MISC by Does not apply route. Change sensor every 15 days.   dapagliflozin  propanediol (FARXIGA ) 10 MG TABS tablet Take 1 tablet (10 mg total) by mouth daily.  finasteride  (PROSCAR ) 5 MG tablet TAKE 1 TABLET(5 MG) BY MOUTH DAILY   fluticasone  (FLONASE ) 50 MCG/ACT nasal spray Place 2 sprays into both nostrils daily.   gabapentin  (NEURONTIN ) 100 MG capsule TAKE 1 CAPSULE(100 MG) BY MOUTH THREE TIMES DAILY   gentamicin ointment (GARAMYCIN) 0.1 % Apply 1 Application topically 3 (three) times daily.   glucose blood (ACCU-CHEK GUIDE) test strip Use as instructed   hydrOXYzine  (ATARAX ) 25 MG tablet Take 1 tablet (25 mg total) by mouth 3 (three) times daily.   insulin  glargine (LANTUS  SOLOSTAR) 100 UNIT/ML Solostar Pen Inject 32 Units into the skin at bedtime.   insulin  lispro (HUMALOG) 100 UNIT/ML KwikPen  Junior 12 units at breakfast, 10 units at lunch and 12 units at supper time   Insulin  Pen Needle (PEN NEEDLES) 31G X 5 MM MISC 1 each by Does not apply route daily as needed.   isosorbide  mononitrate (IMDUR ) 30 MG 24 hr tablet TAKE 1/2 TABLET(15 MG) BY MOUTH DAILY   Lactobacillus (PROBIOTIC ACIDOPHILUS PO) Take 1 capsule by mouth daily.    metFORMIN  (GLUCOPHAGE ) 1000 MG tablet TAKE 1 TABLET(1000 MG) BY MOUTH TWICE DAILY   Nebulizers (COMPRESSOR/NEBULIZER) MISC Nebulizer Supplies   omeprazole  (PRILOSEC) 20 MG capsule TAKE 1 CAPSULE(20 MG) BY MOUTH DAILY   oxybutynin  (DITROPAN -XL) 10 MG 24 hr tablet TAKE 1 TABLET(10 MG) BY MOUTH DAILY   ramipril  (ALTACE ) 5 MG capsule TAKE 2 CAPSULES BY MOUTH EVERY MORNING AND 1 CAPSULE EVERY NIGHT AT BEDTIME   rosuvastatin  (CRESTOR ) 10 MG tablet TAKE 1 TABLET(10 MG) BY MOUTH DAILY   Spacer/Aero-Holding Chambers (AEROCHAMBER PLS FLOVU MTHPIECE) DEVI Use as directed   tamsulosin  (FLOMAX ) 0.4 MG CAPS capsule TAKE 2 CAPSULES(0.8 MG) BY MOUTH DAILY   TRULICITY  3 MG/0.5ML SOAJ 3 Milligram(s) SUB-Q Once a Week   zolpidem  (AMBIEN ) 10 MG tablet Take 1 tablet (10 mg total) by mouth at bedtime.   [DISCONTINUED] Ensifentrine  (OHTUVAYRE ) 3 MG/2.5ML SUSP Inhale 2.5 mLs into the lungs in the morning and at bedtime. **refills to be sent to [facility]**    Immunization History  Administered Date(s) Administered   Fluad Quad(high Dose 65+) 08/05/2020, 08/02/2022, 08/02/2022   Influenza, High Dose Seasonal PF 07/24/2015, 07/21/2016, 08/02/2017, 08/06/2017, 07/27/2018   Influenza,inj,Quad PF,6+ Mos 06/27/2019   Influenza-Unspecified 09/14/2012, 07/19/2021, 08/08/2023   Moderna SARS-COV2 Booster Vaccination 07/06/2021   PFIZER(Purple Top)SARS-COV-2 Vaccination 11/10/2019, 12/01/2019, 07/14/2020   Pfizer Covid-19 Vaccine Bivalent Booster 16yrs & up 01/26/2021   Pfizer(Comirnaty)Fall Seasonal Vaccine 12 years and older 08/02/2022   Pneumococcal Conjugate-13  08/25/2014   Pneumococcal Polysaccharide-23 01/26/2016   Pneumococcal-Unspecified 12/17/2005   Rsv, Bivalent, Protein Subunit Rsvpref,pf Pattricia Bores) 09/05/2022   Tdap 12/17/2005, 02/25/2016   Zoster Recombinant(Shingrix) 08/23/2021, 06/05/2022   Zoster, Live 11/24/2013        Objective:     BP 118/82 (BP Location: Right Arm, Cuff Size: Normal)   Pulse 91   Temp (!) 97.1 F (36.2 C)   Ht 5\' 5"  (1.651 m)   Wt 183 lb (83 kg)   SpO2 92%   BMI 30.45 kg/m   SpO2: 92 % O2 Device: None (Room air)  Repeat O2 sat determination was 92%.  GENERAL: Awake, alert, no respiratory distress.  Well-appearing, fully ambulatory.  Uses POC with activity. HEAD: Normocephalic, atraumatic.  EYES: Pupils equal, round, reactive to light.  No scleral icterus.  MOUTH: Oral mucosa moist.  Dentition intact. NECK: Supple. No thyromegaly. Trachea midline. No JVD.  No adenopathy. PULMONARY: Good air entry bilaterally.  Faint dry crackles at bases (chronic), no other adventitious sounds. CARDIOVASCULAR: S1 and S2.  No rubs, murmurs or gallops heard. ABDOMEN: Protuberant, truncal obesity, otherwise benign. MUSCULOSKELETAL: No joint deformity, no clubbing, trace edema.  NEUROLOGIC: No deficits, no gait disturbance, speech is fluent. SKIN: Intact,warm,dry.  Bilateral stasis changes lower extremities. PSYCH: Mood and behavior normal.  PFTs reviewed with patient.  Modest improvement from prior.      Assessment & Plan:     ICD-10-CM   1. COPD, moderate (HCC) Moderate/Severe  J44.9     2. Chronic respiratory failure with hypoxia (HCC)  J96.11     3. Former smoker  Z87.891      Meds ordered this encounter  Medications   Ensifentrine  (OHTUVAYRE ) 3 MG/2.5ML SUSP    Sig: Inhale 2.5 mLs into the lungs in the morning and at bedtime. **refills to be sent to [facility]**   Discussion:    Chronic Obstructive Pulmonary Disease (COPD) COPD with shallow breathing and improved oxygen   carrying capacity. Spirometry shows a 7% improvement compared to June 2024. Continues effective use of nebulized medication.  Has nocturnal hypoxemia related to COPD, but uses 2 liters of oxygen  at night.  Compliant with the therapy. - Continue nebulized medication. - Refill all necessary medications.  Chronic Respiratory Failure with Hypoxia Chronic respiratory failure with hypoxia, managed with nocturnal oxygen  therapy. Oxygen  saturation was 92% during the visit, consistent with previous measurements. Shallow breathing likely related to COPD, truncal obesity. - Continue nocturnal oxygen  therapy at 2 liters.     He is a former smoker with no relapse.  Advised if symptoms do not improve or worsen, to please contact office for sooner follow up or seek emergency care.    I spent 32 minutes of dedicated to the care of this patient on the date of this encounter to include pre-visit review of records, face-to-face time with the patient discussing conditions above, post visit ordering of testing, clinical documentation with the electronic health record, making appropriate referrals as documented, and communicating necessary findings to members of the patients care team.     C. Chloe Counter, MD Advanced Bronchoscopy PCCM [facility]    *This note was generated using voice recognition software/Dragon and/or AI transcription program.  Despite best efforts to proofread, errors can occur which can change the meaning. Any transcriptional errors that result from this process are unintentional and may not be fully corrected at the time of dictation.

## 2024-03-28 NOTE — Patient Instructions (Signed)
 Full PFT performed today.

## 2024-03-28 NOTE — Discharge Instructions (Signed)

## 2024-03-28 NOTE — Progress Notes (Signed)
 Full PFT performed today.

## 2024-03-29 ENCOUNTER — Ambulatory Visit: Payer: Self-pay | Admitting: Anesthesiology

## 2024-03-29 ENCOUNTER — Other Ambulatory Visit: Payer: Self-pay

## 2024-03-29 ENCOUNTER — Ambulatory Visit
Admission: RE | Admit: 2024-03-29 | Discharge: 2024-03-29 | Disposition: A | Discharge status: Home / Self Care | Attending: Ophthalmology | Admitting: Ophthalmology

## 2024-03-29 ENCOUNTER — Encounter: Payer: Self-pay | Admitting: Ophthalmology

## 2024-03-29 ENCOUNTER — Encounter
Admission: RE | Disposition: A | Discharge status: Home / Self Care | Payer: Self-pay | Source: Home / Self Care | Attending: Ophthalmology

## 2024-03-29 DIAGNOSIS — Z7985 Long-term (current) use of injectable non-insulin antidiabetic drugs: Secondary | ICD-10-CM | POA: Insufficient documentation

## 2024-03-29 DIAGNOSIS — Z9841 Cataract extraction status, right eye: Secondary | ICD-10-CM | POA: Diagnosis not present

## 2024-03-29 DIAGNOSIS — E1121 Type 2 diabetes mellitus with diabetic nephropathy: Secondary | ICD-10-CM | POA: Diagnosis not present

## 2024-03-29 DIAGNOSIS — Z961 Presence of intraocular lens: Secondary | ICD-10-CM | POA: Insufficient documentation

## 2024-03-29 DIAGNOSIS — J449 Chronic obstructive pulmonary disease, unspecified: Secondary | ICD-10-CM | POA: Insufficient documentation

## 2024-03-29 DIAGNOSIS — I10 Essential (primary) hypertension: Secondary | ICD-10-CM | POA: Diagnosis not present

## 2024-03-29 DIAGNOSIS — H5703 Miosis: Secondary | ICD-10-CM | POA: Insufficient documentation

## 2024-03-29 DIAGNOSIS — Z87891 Personal history of nicotine dependence: Secondary | ICD-10-CM | POA: Insufficient documentation

## 2024-03-29 DIAGNOSIS — E1136 Type 2 diabetes mellitus with diabetic cataract: Secondary | ICD-10-CM | POA: Insufficient documentation

## 2024-03-29 DIAGNOSIS — H2512 Age-related nuclear cataract, left eye: Secondary | ICD-10-CM | POA: Diagnosis present

## 2024-03-29 DIAGNOSIS — Z794 Long term (current) use of insulin: Secondary | ICD-10-CM | POA: Diagnosis not present

## 2024-03-29 DIAGNOSIS — Z7984 Long term (current) use of oral hypoglycemic drugs: Secondary | ICD-10-CM | POA: Insufficient documentation

## 2024-03-29 HISTORY — PX: CATARACT EXTRACTION W/PHACO: SHX586

## 2024-03-29 LAB — GLUCOSE, CAPILLARY: Glucose-Capillary: 121 mg/dL — ABNORMAL HIGH (ref 70–99)

## 2024-03-29 SURGERY — PHACOEMULSIFICATION, CATARACT, WITH IOL INSERTION
Anesthesia: Monitor Anesthesia Care | Laterality: Left

## 2024-03-29 MED ORDER — FENTANYL CITRATE (PF) 100 MCG/2ML IJ SOLN
INTRAMUSCULAR | Status: AC
  Filled 2024-03-29: qty 2

## 2024-03-29 MED ORDER — CEFUROXIME OPHTHALMIC INJECTION 1 MG/0.1 ML
INJECTION | OPHTHALMIC | Status: DC | PRN
  Administered 2024-03-29: .1 mL via INTRACAMERAL

## 2024-03-29 MED ORDER — TETRACAINE HCL 0.5 % OP SOLN
1.0000 [drp] | OPHTHALMIC | Status: DC | PRN
Start: 2024-03-29 — End: 2024-03-29
  Administered 2024-03-29 (×3): 1 [drp] via OPHTHALMIC

## 2024-03-29 MED ORDER — ARMC OPHTHALMIC DILATING DROPS
1.0000 | OPHTHALMIC | Status: DC | PRN
Start: 2024-03-29 — End: 2024-03-29
  Administered 2024-03-29 (×3): 1 via OPHTHALMIC

## 2024-03-29 MED ORDER — FENTANYL CITRATE (PF) 100 MCG/2ML IJ SOLN
INTRAMUSCULAR | Status: DC | PRN
  Administered 2024-03-29 (×2): 50 ug via INTRAVENOUS

## 2024-03-29 MED ORDER — ARMC OPHTHALMIC DILATING DROPS
OPHTHALMIC | Status: AC
  Filled 2024-03-29: qty 0.5

## 2024-03-29 MED ORDER — SIGHTPATH DOSE#1 BSS IO SOLN
INTRAOCULAR | Status: DC | PRN
  Administered 2024-03-29: 15 mL via INTRAOCULAR

## 2024-03-29 MED ORDER — TETRACAINE HCL 0.5 % OP SOLN
OPHTHALMIC | Status: AC
  Filled 2024-03-29: qty 4

## 2024-03-29 MED ORDER — LIDOCAINE HCL (PF) 2 % IJ SOLN
INTRAOCULAR | Status: DC | PRN
  Administered 2024-03-29: 1 mL

## 2024-03-29 MED ORDER — MIDAZOLAM HCL 2 MG/2ML IJ SOLN
INTRAMUSCULAR | Status: DC | PRN
  Administered 2024-03-29 (×2): 1 mg via INTRAVENOUS

## 2024-03-29 MED ORDER — SIGHTPATH DOSE#1 BSS IO SOLN
INTRAOCULAR | Status: DC | PRN
  Administered 2024-03-29: 37 mL via OPHTHALMIC

## 2024-03-29 MED ORDER — SIGHTPATH DOSE#1 NA HYALUR & NA CHOND-NA HYALUR IO KIT
PACK | INTRAOCULAR | Status: DC | PRN
  Administered 2024-03-29: 1 via OPHTHALMIC

## 2024-03-29 MED ORDER — LACTATED RINGERS IV SOLN: INTRAVENOUS | Status: DC

## 2024-03-29 MED ORDER — DEXMEDETOMIDINE HCL 200 MCG/2ML IV SOLN
INTRAVENOUS | Status: AC
  Filled 2024-03-29: qty 2

## 2024-03-29 MED ORDER — MIDAZOLAM HCL 2 MG/2ML IJ SOLN
INTRAMUSCULAR | Status: AC
  Filled 2024-03-29: qty 2

## 2024-03-29 MED ORDER — BRIMONIDINE TARTRATE-TIMOLOL 0.2-0.5 % OP SOLN
OPHTHALMIC | Status: DC | PRN
  Administered 2024-03-29: 1 [drp] via OPHTHALMIC

## 2024-03-29 SURGICAL SUPPLY — 12 items
CATARACT SUITE SIGHTPATH (MISCELLANEOUS) ×1 IMPLANT
FEE CATARACT SUITE SIGHTPATH (MISCELLANEOUS) ×1 IMPLANT
GLOVE BIOGEL PI IND STRL 8 (GLOVE) ×1 IMPLANT
GLOVE SURG LX STRL 7.5 STRW (GLOVE) ×1 IMPLANT
GLOVE SURG PROTEXIS BL SZ6.5 (GLOVE) ×1 IMPLANT
GLOVE SURG SYN 6.5 PF PI BL (GLOVE) ×1 IMPLANT
LENS IOL TECNIS EYHANCE 21.5 (Intraocular Lens) IMPLANT
NDL FILTER BLUNT 18X1 1/2 (NEEDLE) ×1 IMPLANT
NEEDLE FILTER BLUNT 18X1 1/2 (NEEDLE) ×1 IMPLANT
RING MALYGIN 7.0 (MISCELLANEOUS) IMPLANT
STRAP BODY AND KNEE 60X3 (MISCELLANEOUS) IMPLANT
SYR 3ML LL SCALE MARK (SYRINGE) ×1 IMPLANT

## 2024-03-29 NOTE — Transfer of Care (Signed)
 Immediate Anesthesia Transfer of Care Note  Patient: Billy Wise  Procedure(s) Performed: PHACOEMULSIFICATION, CATARACT, WITH IOL INSERTION, 6.15, 00:30.4 (Left)  Patient Location: PACU  Anesthesia Type: MAC  Level of Consciousness: awake, alert  and patient cooperative  Airway and Oxygen  Therapy: Patient Spontanous Breathing and Patient connected to supplemental oxygen   Post-op Assessment: Post-op Vital signs reviewed, Patient's Cardiovascular Status Stable, Respiratory Function Stable, Patent Airway and No signs of Nausea or vomiting  Post-op Vital Signs: Reviewed and stable  Complications: No notable events documented.

## 2024-03-29 NOTE — Op Note (Signed)
 OPERATIVE NOTE  CARROLL LINGELBACH [id number] 03/29/2024  PREOPERATIVE DIAGNOSIS:   Nuclear sclerotic cataract left eye with miotic pupil      H25.12   POSTOPERATIVE DIAGNOSIS:   Nuclear sclerotic cataract left eye with miotic pupil.     PROCEDURE:  Phacoemulsification with posterior chamber intraocular lens implantation of the left eye which required pupil stretching with the Malyugin pupil expansion device  Ultrasound time: Procedure(s): PHACOEMULSIFICATION, CATARACT, WITH IOL INSERTION, 6.15, 00:30.4 (Left)  LENS:   Implant Name Type Inv. Item Serial No. Manufacturer Lot No. LRB No. Used Action  LENS IOL TECNIS EYHANCE 21.5 - [id number] Intraocular Lens LENS IOL TECNIS EYHANCE 21.5 [id number] [employer]  Left 1 Implanted         SURGEON:  Berline Brenner, MD   ANESTHESIA: Topical with tetracaine  drops and 2% Xylocaine  jelly, augmented with 1% preservative-free intracameral lidocaine .   COMPLICATIONS:  None.   DESCRIPTION OF PROCEDURE:  The patient was identified in the holding room and transported to the operating room and placed in the supine position under the operating microscope.  The left eye was identified as the operative eye and it was prepped and draped in the usual sterile ophthalmic fashion.   A 1 millimeter clear-corneal paracentesis was made at the 1:30 position.  The anterior chamber was filled with Viscoat viscoelastic.  0.5 ml of preservative-free 1% lidocaine  was injected into the anterior chamber.  A 2.4 millimeter keratome was used to make a near-clear corneal incision at the 10:30 position.  A Malyugin pupil expander was then placed through the main incision and into the anterior chamber of the eye.  The edge of the iris was secured on the lip of the pupil expander and it was released, thereby expanding the pupil to approximately 7 millimeters for completion of the cataract surgery.  Additional Viscoat was placed in the anterior chamber.  A cystotome and  capsulorrhexis forceps were used to make a curvilinear capsulorrhexis.   Balanced salt  solution was used to hydrodissect and hydrodelineate the lens nucleus.   Phacoemulsification was used in stop and chop fashion to remove the lens, nucleus and epinucleus.  The remaining cortex was aspirated using the irrigation aspiration handpiece.  Additional Provisc was placed into the eye to distend the capsular bag for lens placement.  A lens was then injected into the capsular bag.  The pupil expanding ring was removed using a Kuglen hook and insertion device. The remaining viscoelastic was aspirated from the capsular bag and the anterior chamber.  The anterior chamber was filled with balanced salt  solution to inflate to a physiologic pressure.   Wounds were hydrated with balanced salt  solution.  The anterior chamber was inflated to a physiologic pressure with balanced salt  solution.  No wound leaks were noted. Cefuroxime  0.1 ml of a 10mg /ml solution was injected into the anterior chamber for a dose of 1 mg of intracameral antibiotic at the completion of the case.   Timolol  and Brimonidine  drops were applied to the eye.  The patient was taken to the recovery room in stable condition without complications of anesthesia or surgery.  Megon Kalina 03/29/2024, 7:57 AM

## 2024-03-29 NOTE — H&P (Signed)
 [facility]   Primary Care Physician:  Mazie Speed, MD Ophthalmologist: Dr. Annell Kidney  Pre-Procedure History & Physical: HPI:  Billy Wise is a 78 y.o. male here for ophthalmic surgery.   Past Medical History:  Diagnosis Date   Actinic keratoses    Anxiety    BPH (benign prostatic hyperplasia)    Chronic bronchitis (HCC)    Chronic respiratory failure with hypoxia (HCC)    COPD (chronic obstructive pulmonary disease) (HCC)    Emphysema lung (HCC)    Family history of adverse reaction to anesthesia    Father died during procedure 04-19-1989).  Fluid backed up in his lungs   GERD (gastroesophageal reflux disease)    Hypertension    Insomnia    Iron deficiency anemia    Mixed hyperlipidemia    Oxygen  deficiency    Type 2 diabetes mellitus with diabetic nephropathy [facility])     Past Surgical History:  Procedure Laterality Date   CARDIAC CATHETERIZATION     CATARACT EXTRACTION W/PHACO Right 03/15/2024   Procedure: PHACOEMULSIFICATION, CATARACT, WITH IOL INSERTION 4.85, 00.27.6;  Surgeon: Annell Kidney, MD;  Location: [facility];  Service: Ophthalmology;  Laterality: Right;   CHOLECYSTECTOMY     COLONOSCOPY WITH PROPOFOL  N/A 10/18/2018   Procedure: COLONOSCOPY WITH PROPOFOL ;  Surgeon: Marnee Sink, MD;  Location: [facility];  Service: Endoscopy;  Laterality: N/A;   CORONARY PRESSURE/FFR STUDY N/A 12/29/2022   Procedure: INTRAVASCULAR PRESSURE WIRE/FFR STUDY;  Surgeon: Sammy Crisp, MD;  Location: [facility];  Service: Cardiovascular;  Laterality: N/A;   HERNIA REPAIR     RIGHT/LEFT HEART CATH AND CORONARY ANGIOGRAPHY Bilateral 12/29/2022   Procedure: RIGHT/LEFT HEART CATH AND CORONARY ANGIOGRAPHY;  Surgeon: Sammy Crisp, MD;  Location: [facility];  Service: Cardiovascular;  Laterality: Bilateral;    Prior to Admission medications   Medication Sig Start Date End Date Taking? Authorizing Provider  amLODipine   (NORVASC ) 5 MG tablet TAKE 1 TABLET(5 MG) BY MOUTH DAILY 02/14/24  Yes Bacigalupo, Angela M, MD  ascorbic acid (VITAMIN C) 1000 MG tablet Take 1 tablet by mouth daily.   Yes [provider]  benzonatate  (TESSALON ) 100 MG capsule Take 1 capsule (100 mg total) by mouth 2 (two) times daily as needed for cough. 09/01/23  Yes Pardue, Asencion Blacksmith, DO  BREZTRI  AEROSPHERE 160-9-4.8 MCG/ACT AERO INHALE 2 PUFFS INTO THE LUNGS IN THE MORNING AND AT BEDTIME 11/29/23  Yes Marc Senior, MD  dapagliflozin  propanediol (FARXIGA ) 10 MG TABS tablet Take 1 tablet (10 mg total) by mouth daily. 11/16/22  Yes Bacigalupo, Stan Eans, MD  Ensifentrine  (OHTUVAYRE ) 3 MG/2.5ML SUSP Inhale 2.5 mLs into the lungs in the morning and at bedtime. **refills to be sent to [facility]** 03/28/24  Yes Marc Senior, MD  finasteride  (PROSCAR ) 5 MG tablet TAKE 1 TABLET(5 MG) BY MOUTH DAILY 02/14/24  Yes Bacigalupo, Stan Eans, MD  fluticasone  (FLONASE ) 50 MCG/ACT nasal spray Place 2 sprays into both nostrils daily. 09/22/23  Yes Rufus Council A, FNP  gabapentin  (NEURONTIN ) 100 MG capsule TAKE 1 CAPSULE(100 MG) BY MOUTH THREE TIMES DAILY 01/04/24  Yes Clifton, Kellie A, FNP  gentamicin ointment (GARAMYCIN) 0.1 % Apply 1 Application topically 3 (three) times daily. 10/07/23  Yes [provider]  hydrOXYzine  (ATARAX ) 25 MG tablet Take 1 tablet (25 mg total) by mouth 3 (three) times daily. 09/22/23  Yes Tasia Farr, FNP  insulin  glargine (LANTUS  SOLOSTAR) 100 UNIT/ML Solostar Pen Inject 32  Units into the skin at bedtime. 02/15/24  Yes Bacigalupo, Stan Eans, MD  insulin  lispro (HUMALOG) 100 UNIT/ML KwikPen Junior 12 units at breakfast, 10 units at lunch and 12 units at supper time 04/16/21  Yes [provider]  isosorbide  mononitrate (IMDUR ) 30 MG 24 hr tablet TAKE 1/2 TABLET(15 MG) BY MOUTH DAILY 02/17/24  Yes End, Veryl Gottron, MD  Lactobacillus (PROBIOTIC ACIDOPHILUS PO) Take 1 capsule by mouth daily.     Yes [provider]  metFORMIN  (GLUCOPHAGE ) 1000 MG tablet TAKE 1 TABLET(1000 MG) BY MOUTH TWICE DAILY 02/14/24  Yes Bacigalupo, Angela M, MD  omeprazole  (PRILOSEC) 20 MG capsule TAKE 1 CAPSULE(20 MG) BY MOUTH DAILY 11/11/23  Yes Bacigalupo, Angela M, MD  oxybutynin  (DITROPAN -XL) 10 MG 24 hr tablet TAKE 1 TABLET(10 MG) BY MOUTH DAILY 02/09/24  Yes Bacigalupo, Angela M, MD  ramipril  (ALTACE ) 5 MG capsule TAKE 2 CAPSULES BY MOUTH EVERY MORNING AND 1 CAPSULE EVERY NIGHT AT BEDTIME 03/17/24  Yes Bacigalupo, Stan Eans, MD  rosuvastatin  (CRESTOR ) 10 MG tablet TAKE 1 TABLET(10 MG) BY MOUTH DAILY 01/04/24  Yes End, Veryl Gottron, MD  tamsulosin  (FLOMAX ) 0.4 MG CAPS capsule TAKE 2 CAPSULES(0.8 MG) BY MOUTH DAILY 08/19/23  Yes Bacigalupo, Stan Eans, MD  TRULICITY  3 MG/0.5ML SOAJ 3 Milligram(s) SUB-Q Once a Week   Yes [provider]  zolpidem  (AMBIEN ) 10 MG tablet Take 1 tablet (10 mg total) by mouth at bedtime. 03/22/24  Yes Simmons-Robinson, Makiera, MD  Accu-Chek Softclix Lancets lancets Use to check blood sugar twice a day. DX E11.9 11/12/20   Mazie Speed, MD  albuterol  (PROVENTIL ) (2.5 MG/3ML) 0.083% nebulizer solution Take 3 mLs (2.5 mg total) by nebulization every 6 (six) hours as needed for wheezing or shortness of breath. 07/15/23   Marc Senior, MD  albuterol  (VENTOLIN  HFA) [phone] Base) MCG/ACT inhaler INHALE 2 PUFFS BY MOUTH EVERY 6 HOURS AS NEEDED FOR WHEEZING AND SHORTNESS OF BREATH 11/30/23   Marc Senior, MD  Blood Glucose Monitoring Suppl (ACCU-CHEK AVIVA PLUS) w/Device KIT Use to check blood sugar twice a day.  DX E11.9 02/07/20   Mazie Speed, MD  Continuous Glucose Sensor (FREESTYLE LIBRE 3 PLUS SENSOR) MISC by Does not apply route. Change sensor every 15 days.    [provider]  glucose blood (ACCU-CHEK GUIDE) test strip Use as instructed 12/16/20   Bacigalupo, Angela M, MD  Insulin  Pen Needle (PEN NEEDLES) 31G X 5 MM MISC 1 each by Does not apply  route daily as needed. 04/19/19   Bacigalupo, Angela M, MD  Nebulizers (COMPRESSOR/NEBULIZER) MISC Nebulizer Supplies 03/23/24   Marc Senior, MD  nitroGLYCERIN  (NITROSTAT ) 0.4 MG SL tablet Place 1 tablet (0.4 mg total) under the tongue every 5 (five) minutes as needed for chest pain. 12/29/22 03/07/24  End, Veryl Gottron, MD  Spacer/Aero-Holding Chambers (AEROCHAMBER PLS FLOVU MTHPIECE) DEVI Use as directed 09/10/23   Marc Senior, MD    Allergies as of 02/18/2024   (No Known Allergies)    Family History  Problem Relation Age of Onset   Heart attack Sister    Hypertension Sister    Gout Father     Social History   Socioeconomic History   Marital status: Married    Spouse name: Not on file   Number of children: 2   Years of education: Not on file   Highest education level: 12th grade  Occupational History   Occupation: Economist of [employer]    Comment: full time  Tobacco Use   Smoking status: Former    Current packs/day: 0.00    Average packs/day: 1.5 packs/day for 50.0 years (75.0 ttl pk-yrs)    Types: Cigarettes    Start date: 09/29/1958    Quit date: 09/29/2008    Years since quitting: 15.5   Smokeless tobacco: Never  Vaping Use   Vaping status: Never Used  Substance and Sexual Activity   Alcohol use: Not Currently    Comment: 2 drinks a month / cocktail   Drug use: No   Sexual activity: Not Currently    Partners: Female    Birth control/protection: None  Other Topics Concern   Not on file  Social History Narrative   Not on file   Social Drivers of Health   Financial Resource Strain: Low Risk  (02/16/2024)   Received from [facility]   Overall Financial Resource Strain (CARDIA)    Difficulty of Paying Living Expenses: Not hard at all  Food Insecurity: No Food Insecurity (02/16/2024)   Received from [facility]   Hunger Vital Sign    Worried About Running Out of Food in the Last Year: Never true    Ran Out of Food in the Last Year: Never true   Transportation Needs: No Transportation Needs (02/16/2024)   Received from [facility] - Transportation    Lack of Transportation (Medical): No    Lack of Transportation (Non-Medical): No  Physical Activity: Insufficiently Active (02/16/2024)   Received from [facility]   Exercise Vital Sign    Days of Exercise per Week: 2 days    Minutes of Exercise per Session: 10 min  Stress: No Stress Concern Present (02/16/2024)   Received from [facility] - Occupational Stress Questionnaire    Feeling of Stress : Not at all  Social Connections: Socially Integrated (02/16/2024)   Received from [facility]   Social Network    How would you rate your social network (family, work, friends)?: Good participation with social networks  Intimate Partner Violence: Not At Risk (02/16/2024)   Received from [facility]   HITS    Over the last 12 months how often did your partner physically hurt you?: Never    Over the last 12 months how often did your partner insult you or talk down to you?: Never    Over the last 12 months how often did your partner threaten you with physical harm?: Never    Over the last 12 months how often did your partner scream or curse at you?: Never    Review of Systems: See HPI, otherwise negative ROS  Physical Exam: BP 135/80   Pulse 95   Temp 98.1 F (36.7 C) (Temporal)   Resp 19   Ht 5' 5 (1.651 m)   Wt 83 kg   SpO2 93%   BMI 30.45 kg/m  General:   Alert,  pleasant and cooperative in NAD Head:  Normocephalic and atraumatic. Lungs:  Clear to auscultation.    Heart:  Regular rate and rhythm.   Impression/Plan: Billy Wise is here for ophthalmic surgery.  Risks, benefits, limitations, and alternatives regarding ophthalmic surgery have been reviewed with the patient.  Questions have been answered.  All parties agreeable.   Annell Kidney, MD  03/29/2024, 7:33 AM

## 2024-03-29 NOTE — Anesthesia Postprocedure Evaluation (Signed)
 Anesthesia Post Note  Patient: RIPKEN REKOWSKI  Procedure(s) Performed: PHACOEMULSIFICATION, CATARACT, WITH IOL INSERTION, 6.15, 00:30.4 (Left)  Patient location during evaluation: PACU Anesthesia Type: MAC Level of consciousness: awake and alert Pain management: pain level controlled Vital Signs Assessment: post-procedure vital signs reviewed and stable Respiratory status: spontaneous breathing, nonlabored ventilation, respiratory function stable and patient connected to nasal cannula oxygen  Cardiovascular status: stable and blood pressure returned to baseline Postop Assessment: no apparent nausea or vomiting Anesthetic complications: no   No notable events documented.   Last Vitals:  Vitals:   03/29/24 0755 03/29/24 0800  BP: 103/66 100/66  Pulse: 79 81  Resp: 20 20  Temp: (!) 36.3 C (!) 36.3 C  SpO2: 96% 92%    Last Pain:  Vitals:   03/29/24 0755  TempSrc:   PainSc: 0-No pain                 Emilie Harden

## 2024-03-30 ENCOUNTER — Encounter: Payer: Self-pay | Admitting: Ophthalmology

## 2024-04-04 ENCOUNTER — Other Ambulatory Visit: Payer: Self-pay | Admitting: Internal Medicine

## 2024-04-05 ENCOUNTER — Ambulatory Visit: Payer: Self-pay

## 2024-04-05 NOTE — Telephone Encounter (Signed)
 FYI Only or Action Required?: FYI only for provider  Patient was last seen in primary care on 02/15/2024 by Mazie Speed, MD. Called Nurse Triage reporting Spasms. Symptoms began several months ago. Interventions attempted: OTC medications: cramp medication. Symptoms are: gradually worsening.  Triage Disposition: No disposition on file.  Patient/caregiver understands and will follow disposition?:      Communication  Reason for Triage: Patient has cramps and restless leg syndrome and would like muscle relaxer medication called in to preferred pharmacy.     Callback #: [id number]     Preferred Pharmacy:   [id number] - [place], [place] - [address] AT [facility] [address]   [address] [place] [address]   Phone: [phone] Fax: [phone]   Hours: Not open 24 hours  Reason for Disposition . Muscle aches are a chronic symptom (recurrent or ongoing AND present > 4 weeks)  Answer Assessment - Initial Assessment Questions 1. ONSET: When did the muscle aches or body pains start?      Off and on for years 2. LOCATION: What part of your body is hurting? (e.g., entire body, arms, legs)      Both legs 3. SEVERITY: How bad is the pain? (Scale 1-10; or mild, moderate, severe)   - MILD (1-3): doesn't interfere with normal activities    - MODERATE (4-7): interferes with normal activities or awakens from sleep    - SEVERE (8-10):  excruciating pain, unable to do any normal activities      mod 4. CAUSE: What do you think is causing the pains?     Restless legs  5. FEVER: Have you been having fever?     no 6. OTHER SYMPTOMS: Do you have any other symptoms? (e.g., chest pain, weakness, rash, cold or flu symptoms, weight loss)     Leg cramps and restless leg    Pt is wanting a muscle relaxer called in to his pharmacy.  Protocols used: Muscle Aches and Body Pain-A-AH

## 2024-04-06 NOTE — Telephone Encounter (Signed)
 Cannot take muscle relaxer with ambien  as they are both sedating.  Can come in for re-evaluation if he'd like to discuss alternatives

## 2024-04-06 NOTE — Telephone Encounter (Signed)
 Appt scheuled for July 1st

## 2024-04-07 ENCOUNTER — Telehealth: Payer: Self-pay

## 2024-04-07 NOTE — Telephone Encounter (Signed)
 Copied from CRM [id number]. Topic: Appointments - Scheduling Inquiry for Clinic >> Apr 06, 2024 12:10 PM Bearl Botts E wrote: Reason for CRM: Pt called to schedule an appt per PCP's request, nothing available until 1 week from today. Pt declined for now

## 2024-04-07 NOTE — Telephone Encounter (Signed)
 Noted

## 2024-04-07 NOTE — Telephone Encounter (Signed)
 FYI

## 2024-04-18 ENCOUNTER — Ambulatory Visit: Admitting: Family Medicine

## 2024-04-18 ENCOUNTER — Encounter: Payer: Self-pay | Admitting: Family Medicine

## 2024-04-18 VITALS — BP 131/83 | HR 93 | Ht 65.0 in | Wt 181.0 lb

## 2024-04-18 DIAGNOSIS — Z794 Long term (current) use of insulin: Secondary | ICD-10-CM

## 2024-04-18 DIAGNOSIS — G4700 Insomnia, unspecified: Secondary | ICD-10-CM

## 2024-04-18 DIAGNOSIS — E1140 Type 2 diabetes mellitus with diabetic neuropathy, unspecified: Secondary | ICD-10-CM

## 2024-04-18 DIAGNOSIS — Z862 Personal history of diseases of the blood and blood-forming organs and certain disorders involving the immune mechanism: Secondary | ICD-10-CM | POA: Diagnosis not present

## 2024-04-18 DIAGNOSIS — G2581 Restless legs syndrome: Secondary | ICD-10-CM | POA: Insufficient documentation

## 2024-04-18 MED ORDER — ROPINIROLE HCL 0.25 MG PO TABS: ORAL_TABLET | ORAL | 0 refills | Status: DC

## 2024-04-18 MED ORDER — ZOLPIDEM TARTRATE ER 12.5 MG PO TBCR: 12.5000 mg | EXTENDED_RELEASE_TABLET | Freq: Every evening | ORAL | 0 refills | Status: DC | PRN

## 2024-04-18 NOTE — Assessment & Plan Note (Signed)
 Peripheral neuropathy with numbness and tingling from hip to knee, improved with gabapentin . Current dose of gabapentin  is 100 mg, effective for neuropathy but causes drowsiness. - Continue gabapentin  100 mg, taken once in the morning, at supper, and at bedtime.

## 2024-04-18 NOTE — Assessment & Plan Note (Signed)
 Chronic insomnia with difficulty falling and staying asleep. Long-term use of Ambien  (zolpidem ) for 30 years. Current use of fast-acting Ambien  10 mg helps with falling asleep but not staying asleep. Risks of long-term Ambien  use include memory loss and falls. - Switch to extended-release Ambien  12.5 mg at bedtime. - Ensure he is in bed when taking Ambien  to avoid drowsiness-related risks. - Avoid using short-acting Ambien  with long-acting Ambien .

## 2024-04-18 NOTE — Assessment & Plan Note (Signed)
 Restless legs syndrome with leg restlessness and urge to move, especially in the evenings. Previous sleep study confirmed diagnosis. Current use of gabapentin  100 mg helps with neuropathy but not significantly with restless legs. Discussed ferritin levels' role in restless legs syndrome and potential benefit of Requip (ropinirole) for dopamine regulation. - Order ferritin level to assess for iron deficiency. - Start Requip (ropinirole) 0.25 mg in the evening, titrate to 0.5 mg after one week, and maintain for three weeks. - Adjust timing of Requip intake based on symptom onset in the evening.

## 2024-04-18 NOTE — Progress Notes (Signed)
 Established patient visit   Patient: Billy Wise   DOB: 1946-01-17   78 y.o. Male  MRN: [id number] Visit Date: 04/18/2024  Today's healthcare provider: Jon Eva, MD   Chief Complaint  Patient presents with   Insomnia    Patient reports he has been having trouble sleeping. He reports he will go to sleep but wakes up every hour. He is taking ambien  at night    Leg Cramps    Patient reports bilateral leg cramps all the way down to feet at times but never at the same time. Reports he tries to just relax or crawl to the shower to put some heat on em.    Subjective    Insomnia   HPI     Insomnia    Additional comments: Patient reports he has been having trouble sleeping. He reports he will go to sleep but wakes up every hour. He is taking ambien  at night         Leg Cramps    Additional comments: Patient reports bilateral leg cramps all the way down to feet at times but never at the same time. Reports he tries to just relax or crawl to the shower to put some heat on em.       Last edited by Lilian Fitzpatrick, CMA on 04/18/2024  8:11 AM.       Discussed the use of AI scribe software for clinical note transcription with the patient, who gave verbal consent to proceed.  History of Present Illness   Billy Wise is a 78 year old male with restless leg syndrome and insomnia who presents with sleep disturbances and leg cramps.  He experiences sleep disturbances, getting about five hours of sleep per night. He takes 10 mg of fast-acting Ambien , which helps him fall asleep but only for 1.5 to 2 hours. He recalls better sleep with a continuous release version of Ambien . His sleep improves on nights without leg cramps.  Leg cramps have worsened recently, occurring in the calves, hamstrings, and feet, often upon waking as early as 4 AM. He uses heat for relief. He also experiences chest cramps when lifting heavy objects, which he attributes to muscle  strain.  He has restless leg syndrome, confirmed by a sleep study. He feels an urge to move his legs, especially in the evenings. Gabapentin  100 mg helps with numbness and tingling in his right leg and feet but causes drowsiness and does not fully alleviate restlessness. He takes it three times daily.  He has a history of iron deficiency and took iron pills in the past, with the last iron level check in 2020.         Medications: Outpatient Medications Prior to Visit  Medication Sig   Accu-Chek Softclix Lancets lancets Use to check blood sugar twice a day. DX E11.9   albuterol  (PROVENTIL ) (2.5 MG/3ML) 0.083% nebulizer solution Take 3 mLs (2.5 mg total) by nebulization every 6 (six) hours as needed for wheezing or shortness of breath.   albuterol  (VENTOLIN  HFA) 108 (90 Base) MCG/ACT inhaler INHALE 2 PUFFS BY MOUTH EVERY 6 HOURS AS NEEDED FOR WHEEZING AND SHORTNESS OF BREATH   amLODipine  (NORVASC ) 5 MG tablet TAKE 1 TABLET(5 MG) BY MOUTH DAILY   ascorbic acid (VITAMIN C) 1000 MG tablet Take 1 tablet by mouth daily.   benzonatate  (TESSALON ) 100 MG capsule Take 1 capsule (100 mg total) by mouth 2 (two) times daily as needed for cough.  Blood Glucose Monitoring Suppl (ACCU-CHEK AVIVA PLUS) w/Device KIT Use to check blood sugar twice a day.  DX E11.9   BREZTRI  AEROSPHERE 160-9-4.8 MCG/ACT AERO INHALE 2 PUFFS INTO THE LUNGS IN THE MORNING AND AT BEDTIME   Continuous Glucose Sensor (FREESTYLE LIBRE 3 PLUS SENSOR) MISC by Does not apply route. Change sensor every 15 days.   dapagliflozin  propanediol (FARXIGA ) 10 MG TABS tablet Take 1 tablet (10 mg total) by mouth daily.   Ensifentrine  (OHTUVAYRE ) 3 MG/2.5ML SUSP Inhale 2.5 mLs into the lungs in the morning and at bedtime. **refills to be sent to [facility]**   finasteride  (PROSCAR ) 5 MG tablet TAKE 1 TABLET(5 MG) BY MOUTH DAILY   fluticasone  (FLONASE ) 50 MCG/ACT nasal spray Place 2 sprays into both nostrils daily.   gabapentin   (NEURONTIN ) 100 MG capsule TAKE 1 CAPSULE(100 MG) BY MOUTH THREE TIMES DAILY   gentamicin ointment (GARAMYCIN) 0.1 % Apply 1 Application topically 3 (three) times daily.   glucose blood (ACCU-CHEK GUIDE) test strip Use as instructed   hydrOXYzine  (ATARAX ) 25 MG tablet Take 1 tablet (25 mg total) by mouth 3 (three) times daily.   insulin  glargine (LANTUS  SOLOSTAR) 100 UNIT/ML Solostar Pen Inject 32 Units into the skin at bedtime.   insulin  lispro (HUMALOG) 100 UNIT/ML KwikPen Junior 12 units at breakfast, 10 units at lunch and 12 units at supper time   Insulin  Pen Needle (PEN NEEDLES) 31G X 5 MM MISC 1 each by Does not apply route daily as needed.   isosorbide  mononitrate (IMDUR ) 30 MG 24 hr tablet TAKE 1/2 TABLET(15 MG) BY MOUTH DAILY   Lactobacillus (PROBIOTIC ACIDOPHILUS PO) Take 1 capsule by mouth daily.    metFORMIN  (GLUCOPHAGE ) 1000 MG tablet TAKE 1 TABLET(1000 MG) BY MOUTH TWICE DAILY   Nebulizers (COMPRESSOR/NEBULIZER) MISC Nebulizer Supplies   nitroGLYCERIN  (NITROSTAT ) 0.4 MG SL tablet Place 1 tablet (0.4 mg total) under the tongue every 5 (five) minutes as needed for chest pain.   omeprazole  (PRILOSEC) 20 MG capsule TAKE 1 CAPSULE(20 MG) BY MOUTH DAILY   oxybutynin  (DITROPAN -XL) 10 MG 24 hr tablet TAKE 1 TABLET(10 MG) BY MOUTH DAILY   ramipril  (ALTACE ) 5 MG capsule TAKE 2 CAPSULES BY MOUTH EVERY MORNING AND 1 CAPSULE EVERY NIGHT AT BEDTIME   rosuvastatin  (CRESTOR ) 10 MG tablet TAKE 1 TABLET(10 MG) BY MOUTH DAILY   Spacer/Aero-Holding Chambers (AEROCHAMBER PLS FLOVU MTHPIECE) DEVI Use as directed   tamsulosin  (FLOMAX ) 0.4 MG CAPS capsule TAKE 2 CAPSULES(0.8 MG) BY MOUTH DAILY   TRULICITY  3 MG/0.5ML SOAJ 3 Milligram(s) SUB-Q Once a Week   [DISCONTINUED] zolpidem  (AMBIEN ) 10 MG tablet Take 1 tablet (10 mg total) by mouth at bedtime.   Facility-Administered Medications Prior to Visit  Medication Dose Route Frequency Provider   sodium chloride  flush (NS) 0.9 % injection 3 mL  3 mL  Intravenous Q12H Gollan, Timothy J, MD    Review of Systems  Psychiatric/Behavioral:  The patient has insomnia.        Objective    BP 131/83 (BP Location: Left Arm, Patient Position: Sitting, Cuff Size: Normal)   Pulse 93   Ht 5' 5 (1.651 m)   Wt 181 lb (82.1 kg)   SpO2 90%   BMI 30.12 kg/m    Physical Exam Vitals reviewed.  Constitutional:      General: He is not in acute distress.    Appearance: Normal appearance. He is not diaphoretic.  HENT:     Head: Normocephalic and atraumatic.   Eyes:  General: No scleral icterus.    Conjunctiva/sclera: Conjunctivae normal.    Cardiovascular:     Rate and Rhythm: Normal rate and regular rhythm.     Pulses: Normal pulses.     Heart sounds: Normal heart sounds. No murmur heard. Pulmonary:     Effort: Pulmonary effort is normal. No respiratory distress.     Breath sounds: Normal breath sounds. No wheezing or rhonchi.   Musculoskeletal:     Cervical back: Neck supple.     Right lower leg: No edema.     Left lower leg: No edema.  Lymphadenopathy:     Cervical: No cervical adenopathy.   Skin:    General: Skin is warm and dry.     Findings: No rash.   Neurological:     Mental Status: He is alert and oriented to person, place, and time. Mental status is at baseline.     Motor: No weakness.     Gait: Gait normal.   Psychiatric:        Mood and Affect: Mood normal.        Behavior: Behavior normal.      No results found for any visits on 04/18/24.  Assessment & Plan     Problem List Items Addressed This Visit       Endocrine   Type 2 diabetes mellitus with diabetic neuropathy (HCC)   Peripheral neuropathy with numbness and tingling from hip to knee, improved with gabapentin . Current dose of gabapentin  is 100 mg, effective for neuropathy but causes drowsiness. - Continue gabapentin  100 mg, taken once in the morning, at supper, and at bedtime.        Other   Insomnia - Primary   Chronic insomnia with  difficulty falling and staying asleep. Long-term use of Ambien  (zolpidem ) for 30 years. Current use of fast-acting Ambien  10 mg helps with falling asleep but not staying asleep. Risks of long-term Ambien  use include memory loss and falls. - Switch to extended-release Ambien  12.5 mg at bedtime. - Ensure he is in bed when taking Ambien  to avoid drowsiness-related risks. - Avoid using short-acting Ambien  with long-acting Ambien .      History of anemia   Relevant Orders   Iron, TIBC and Ferritin Panel   Restless leg syndrome   Restless legs syndrome with leg restlessness and urge to move, especially in the evenings. Previous sleep study confirmed diagnosis. Current use of gabapentin  100 mg helps with neuropathy but not significantly with restless legs. Discussed ferritin levels' role in restless legs syndrome and potential benefit of Requip (ropinirole) for dopamine regulation. - Order ferritin level to assess for iron deficiency. - Start Requip (ropinirole) 0.25 mg in the evening, titrate to 0.5 mg after one week, and maintain for three weeks. - Adjust timing of Requip intake based on symptom onset in the evening.      Relevant Orders   Iron, TIBC and Ferritin Panel       Muscle cramps Intermittent muscle cramps in various locations, including calves, hamstrings, feet, and chest, often upon waking and after physical exertion. Good peripheral pulses suggest adequate blood flow. - Encourage core strengthening exercises to address cramps in the chest area.  - consider magnesium glycinate at bedtime in the future      Return in about 4 weeks (around 05/16/2024) for legs and insomnia f/u.       Jon Eva, MD  [facility] [phone] (phone) [phone] (fax)  [facility]

## 2024-04-19 LAB — IRON,TIBC AND FERRITIN PANEL
Ferritin: 27 ng/mL — ABNORMAL LOW (ref 30–400)
Iron Saturation: 13 % — ABNORMAL LOW (ref 15–55)
Iron: 47 ug/dL (ref 38–169)
Total Iron Binding Capacity: 360 ug/dL (ref 250–450)
UIBC: 313 ug/dL (ref 111–343)

## 2024-04-20 ENCOUNTER — Ambulatory Visit: Payer: Self-pay | Admitting: Family Medicine

## 2024-04-20 DIAGNOSIS — R790 Abnormal level of blood mineral: Secondary | ICD-10-CM

## 2024-04-25 ENCOUNTER — Other Ambulatory Visit: Payer: Self-pay | Admitting: Family Medicine

## 2024-04-25 DIAGNOSIS — E1140 Type 2 diabetes mellitus with diabetic neuropathy, unspecified: Secondary | ICD-10-CM

## 2024-05-04 ENCOUNTER — Ambulatory Visit

## 2024-05-05 ENCOUNTER — Ambulatory Visit
Admission: RE | Admit: 2024-05-05 | Discharge: 2024-05-05 | Disposition: A | Discharge status: Home / Self Care | Source: Ambulatory Visit | Attending: Acute Care | Admitting: Acute Care

## 2024-05-05 DIAGNOSIS — Z87891 Personal history of nicotine dependence: Secondary | ICD-10-CM | POA: Insufficient documentation

## 2024-05-05 DIAGNOSIS — Z122 Encounter for screening for malignant neoplasm of respiratory organs: Secondary | ICD-10-CM | POA: Insufficient documentation

## 2024-05-10 ENCOUNTER — Other Ambulatory Visit: Payer: Self-pay | Admitting: Family Medicine

## 2024-05-10 DIAGNOSIS — E1159 Type 2 diabetes mellitus with other circulatory complications: Secondary | ICD-10-CM

## 2024-05-12 ENCOUNTER — Other Ambulatory Visit: Payer: Self-pay | Admitting: Family Medicine

## 2024-05-13 ENCOUNTER — Other Ambulatory Visit: Payer: Self-pay | Admitting: Family Medicine

## 2024-05-14 ENCOUNTER — Other Ambulatory Visit: Payer: Self-pay | Admitting: Family Medicine

## 2024-05-14 DIAGNOSIS — J321 Chronic frontal sinusitis: Secondary | ICD-10-CM

## 2024-05-15 NOTE — Telephone Encounter (Signed)
 Requested medications are due for refill today.  yes  Requested medications are on the active medications list.  yes  Last refill. Both refilled 02/14/2024 with a 90 day supply.   Future visit scheduled.   yes  Notes to clinic.  Labs are expired.    Requested Prescriptions  Pending Prescriptions Disp Refills   finasteride  (PROSCAR ) 5 MG tablet [Pharmacy Med Name: FINASTERIDE  5MG  TABLETS] 90 tablet 0    Sig: TAKE 1 TABLET(5 MG) BY MOUTH DAILY     Urology: 5-alpha Reductase Inhibitors Failed - 05/15/2024  2:11 PM      Failed - PSA in normal range and within 360 days    PSA  Date Value Ref Range Status  04/06/2018 0.6  Final   Prostate Specific Ag, Serum  Date Value Ref Range Status  08/14/2022 0.3 0.0 - 4.0 ng/mL Final    Comment:    Roche ECLIA methodology. According to the American Urological Association, Serum PSA should decrease and remain at undetectable levels after radical prostatectomy. The AUA defines biochemical recurrence as an initial PSA value 0.2 ng/mL or greater followed by a subsequent confirmatory PSA value 0.2 ng/mL or greater. Values obtained with different assay methods or kits cannot be used interchangeably. Results cannot be interpreted as absolute evidence of the presence or absence of malignant disease.          Passed - Valid encounter within last 12 months    Recent Outpatient Visits           3 weeks ago Insomnia, unspecified type   [place] [facility] [place], Jon HERO, MD   3 months ago Diabetes mellitus with proteinuria [facility])   [place] [facility] [place], Jon HERO, MD       Future Appointments             In 3 months Bacigalupo, Jon HERO, MD [facility]             metFORMIN  (GLUCOPHAGE ) 1000 MG tablet [Pharmacy Med Name: METFORMIN  1000MG  TABLETS] 180 tablet 0    Sig: TAKE 1 TABLET(1000 MG) BY MOUTH TWICE DAILY     Endocrinology:  Diabetes - Biguanides  Failed - 05/15/2024  2:11 PM      Failed - B12 Level in normal range and within 720 days    Vitamin B-12  Date Value Ref Range Status  02/15/2024 >2000 (H) 232 - 1245 pg/mL Final         Failed - CBC within normal limits and completed in the last 12 months    WBC  Date Value Ref Range Status  12/22/2022 7.1 4.0 - 10.5 K/uL Final   RBC  Date Value Ref Range Status  12/22/2022 5.45 4.22 - 5.81 MIL/uL Final   Hemoglobin  Date Value Ref Range Status  12/29/2022 13.9 13.0 - 17.0 g/dL Final  89/72/7976 85.6 13.0 - 17.7 g/dL Final   HCT  Date Value Ref Range Status  12/29/2022 41.0 39.0 - 52.0 % Final   Hematocrit  Date Value Ref Range Status  08/14/2022 43.2 37.5 - 51.0 % Final   MCHC  Date Value Ref Range Status  12/22/2022 32.4 30.0 - 36.0 g/dL Final   [place]  Date Value Ref Range Status  12/22/2022 26.8 26.0 - 34.0 pg Final   MCV  Date Value Ref Range Status  12/22/2022 82.6 80.0 - 100.0 fL Final  08/14/2022 82 79 - 97 fL Final  03/29/2014 84 80 -  100 fL Final   No results found for: PLTCOUNTKUC, LABPLAT, POCPLA RDW  Date Value Ref Range Status  12/22/2022 14.9 11.5 - 15.5 % Final  08/14/2022 14.3 11.6 - 15.4 % Final  03/29/2014 14.2 11.5 - 14.5 % Final         Passed - Cr in normal range and within 360 days    Creatinine  Date Value Ref Range Status  03/29/2014 2.20 (H) 0.60 - 1.30 mg/dL Final   Creatinine, Ser  Date Value Ref Range Status  02/15/2024 0.95 0.76 - 1.27 mg/dL Final   Creatinine, Urine  Date Value Ref Range Status  10/22/2022 88.4  Final         Passed - HBA1C is between 0 and 7.9 and within 180 days    Hemoglobin A1C  Date Value Ref Range Status  06/24/2023 7.0  Final   Hgb A1c MFr Bld  Date Value Ref Range Status  02/15/2024 7.0 (H) 4.8 - 5.6 % Final    Comment:             Prediabetes: 5.7 - 6.4          Diabetes: >6.4          Glycemic control for adults with diabetes: <7.0          Passed - eGFR in normal range and  within 360 days    EGFR (African American)  Date Value Ref Range Status  03/29/2014 35 (L)  Final   GFR calc Af Amer  Date Value Ref Range Status  08/05/2020 93 >59 mL/min/1.73 Final    Comment:    **In accordance with recommendations from the NKF-ASN Task force,**   [facility] is in the process of updating its eGFR calculation to the   2021 CKD-EPI creatinine equation that estimates kidney function   without a race variable.    EGFR (Non-African Amer.)  Date Value Ref Range Status  03/29/2014 30 (L)  Final    Comment:    eGFR values <67mL/min/1.73 m2 may be an indication of chronic kidney disease (CKD). Calculated eGFR is useful in patients with stable renal function. The eGFR calculation will not be reliable in acutely ill patients when serum creatinine is changing rapidly. It is not useful in  patients on dialysis. The eGFR calculation may not be applicable to patients at the low and high extremes of body sizes, pregnant women, and vegetarians.    GFR, Estimated  Date Value Ref Range Status  12/22/2022 >60 >60 mL/min Final    Comment:    (NOTE) Calculated using the CKD-EPI Creatinine Equation (2021)    eGFR  Date Value Ref Range Status  02/15/2024 83 >59 mL/min/1.73 Final         Passed - Valid encounter within last 6 months    Recent Outpatient Visits           3 weeks ago Insomnia, unspecified type   [place] [facility] [place], Jon HERO, MD   3 months ago Diabetes mellitus with proteinuria [facility])   [place] [facility] Bacigalupo, Jon HERO, MD       Future Appointments             In 3 months Bacigalupo, Jon HERO, MD [facility]

## 2024-05-23 ENCOUNTER — Ambulatory Visit (INDEPENDENT_AMBULATORY_CARE_PROVIDER_SITE_OTHER): Admitting: Sleep Medicine

## 2024-05-23 ENCOUNTER — Encounter: Payer: Self-pay | Admitting: Family Medicine

## 2024-05-23 ENCOUNTER — Ambulatory Visit: Admitting: Family Medicine

## 2024-05-23 ENCOUNTER — Encounter: Payer: Self-pay | Admitting: Sleep Medicine

## 2024-05-23 VITALS — BP 119/80 | HR 85 | Resp 16 | Ht 65.0 in | Wt 181.0 lb

## 2024-05-23 VITALS — BP 110/60 | HR 103 | Temp 99.1°F | Ht 65.0 in | Wt 181.6 lb

## 2024-05-23 DIAGNOSIS — R790 Abnormal level of blood mineral: Secondary | ICD-10-CM

## 2024-05-23 DIAGNOSIS — J42 Unspecified chronic bronchitis: Secondary | ICD-10-CM | POA: Diagnosis not present

## 2024-05-23 DIAGNOSIS — I2729 Other secondary pulmonary hypertension: Secondary | ICD-10-CM | POA: Diagnosis not present

## 2024-05-23 DIAGNOSIS — G4733 Obstructive sleep apnea (adult) (pediatric): Secondary | ICD-10-CM | POA: Diagnosis not present

## 2024-05-23 DIAGNOSIS — G2581 Restless legs syndrome: Secondary | ICD-10-CM | POA: Diagnosis not present

## 2024-05-23 DIAGNOSIS — I152 Hypertension secondary to endocrine disorders: Secondary | ICD-10-CM | POA: Diagnosis not present

## 2024-05-23 DIAGNOSIS — E1159 Type 2 diabetes mellitus with other circulatory complications: Secondary | ICD-10-CM

## 2024-05-23 DIAGNOSIS — Z794 Long term (current) use of insulin: Secondary | ICD-10-CM

## 2024-05-23 DIAGNOSIS — G4700 Insomnia, unspecified: Secondary | ICD-10-CM | POA: Diagnosis not present

## 2024-05-23 DIAGNOSIS — E1140 Type 2 diabetes mellitus with diabetic neuropathy, unspecified: Secondary | ICD-10-CM

## 2024-05-23 MED ORDER — ZOLPIDEM TARTRATE 10 MG PO TABS: 10.0000 mg | ORAL_TABLET | Freq: Every day | ORAL | 5 refills | Status: DC

## 2024-05-23 MED ORDER — ZOLPIDEM TARTRATE 10 MG PO TABS: 10.0000 mg | ORAL_TABLET | Freq: Every day | ORAL | 1 refills | Status: AC

## 2024-05-23 MED ORDER — ROPINIROLE HCL 0.5 MG PO TABS: 0.5000 mg | ORAL_TABLET | Freq: Every day | ORAL | 1 refills | Status: DC

## 2024-05-23 NOTE — Assessment & Plan Note (Signed)
F/b Pulm No changes today

## 2024-05-23 NOTE — Patient Instructions (Signed)
 SABRA

## 2024-05-23 NOTE — Progress Notes (Unsigned)
 Name:Quintyn KEKOA FYOCK MRN: [id number] DOB: Jul 29, 1946   CHIEF COMPLAINT:  EXCESSIVE DAYTIME SLEEPINESS   HISTORY OF PRESENT ILLNESS:  Mr. Shipper is a 78 y.o. w/ a h/o HTN, hyperlipidemia, RLS, COPD on O2 lpm, obesity and DMII who presents for c/o excessive daytime sleepiness which has been present for several years. Reports nocturnal awakenings due to unclear reasons, however does not have difficulty falling back to sleep. Denies any significant weight changes. Admits to dry mouth. Denies morning headaches, RLS symptoms, dream enactment, cataplexy, hypnagogic or hypnapompic hallucinations. Reports a family history of sleep apnea. Denies drowsy driving. Drinks 1 cup of coffee daily, occasional alcohol use, former smoker, denies illicit drug use.   Bedtime 11 pm Sleep onset 20 mins Rise time 6:30-7:30 am   EPWORTH SLEEP SCORE 11    05/23/2024    2:00 PM 11/15/2017   12:00 PM  Results of the Epworth flowsheet  Sitting and reading 3 3  Watching TV 3 3  Sitting, inactive in a public place (e.g. a theatre or a meeting) 0 3  As a passenger in a car for an hour without a break 3 3  Lying down to rest in the afternoon when circumstances permit 0 3  Sitting and talking to someone 2 0  Sitting quietly after a lunch without alcohol 0 3  In a car, while stopped for a few minutes in traffic 0 0  Total score 11 18    PAST MEDICAL HISTORY :   has a past medical history of Actinic keratoses, Anxiety, BPH (benign prostatic hyperplasia), Chronic bronchitis (HCC), Chronic respiratory failure with hypoxia (HCC), COPD (chronic obstructive pulmonary disease) (HCC), Emphysema lung (HCC), Family history of adverse reaction to anesthesia, GERD (gastroesophageal reflux disease), Hypertension, Insomnia, Iron deficiency anemia, Mixed hyperlipidemia, Oxygen  deficiency, and Type 2 diabetes mellitus with diabetic nephropathy (HCC).  has a past surgical history that includes Cardiac catheterization;  Hernia repair; Cholecystectomy; Colonoscopy with propofol  (N/A, 10/18/2018); RIGHT/LEFT HEART CATH AND CORONARY ANGIOGRAPHY (Bilateral, 12/29/2022); CORONARY PRESSURE/FFR STUDY (N/A, 12/29/2022); Cataract extraction w/PHACO (Right, 03/15/2024); and Cataract extraction w/PHACO (Left, 03/29/2024). Prior to Admission medications   Medication Sig Start Date End Date Taking? Authorizing Provider  Accu-Chek Softclix Lancets lancets Use to check blood sugar twice a day. DX E11.9 11/12/20  Yes Bacigalupo, Jon HERO, MD  albuterol  (PROVENTIL ) (2.5 MG/3ML) 0.083% nebulizer solution Take 3 mLs (2.5 mg total) by nebulization every 6 (six) hours as needed for wheezing or shortness of breath. 07/15/23  Yes Tamea Dedra CROME, MD  albuterol  (VENTOLIN  HFA) 108 (90 Base) MCG/ACT inhaler INHALE 2 PUFFS BY MOUTH EVERY 6 HOURS AS NEEDED FOR WHEEZING AND SHORTNESS OF BREATH 11/30/23  Yes Tamea Dedra CROME, MD  amLODipine  (NORVASC ) 5 MG tablet TAKE 1 TABLET(5 MG) BY MOUTH DAILY 05/11/24  Yes Bacigalupo, Angela M, MD  ascorbic acid (VITAMIN C) 1000 MG tablet Take 1 tablet by mouth daily.   Yes [provider]  aspirin  81 MG chewable tablet Chew 81 mg by mouth daily.   Yes [provider]  benzonatate  (TESSALON ) 100 MG capsule Take 1 capsule (100 mg total) by mouth 2 (two) times daily as needed for cough. 09/01/23  Yes Pardue, Lauraine SAILOR, DO  Blood Glucose Monitoring Suppl (ACCU-CHEK AVIVA PLUS) w/Device KIT Use to check blood sugar twice a day.  DX E11.9 02/07/20  Yes Bacigalupo, Jon HERO, MD  BREZTRI  AEROSPHERE 160-9-4.8 MCG/ACT AERO INHALE 2 PUFFS INTO THE LUNGS IN THE MORNING AND AT  BEDTIME 11/29/23  Yes Tamea Dedra CROME, MD  Continuous Glucose Sensor (FREESTYLE LIBRE 3 PLUS SENSOR) MISC by Does not apply route. Change sensor every 15 days.   Yes [provider]  dapagliflozin  propanediol (FARXIGA ) 10 MG TABS tablet Take 1 tablet (10 mg total) by mouth daily. 11/16/22  Yes Bacigalupo, Angela M, MD  DROPLET  PEN NEEDLES 32G X 4 MM MISC 4 (four) times daily. 03/29/24  Yes [provider]  Ensifentrine  (OHTUVAYRE ) 3 MG/2.5ML SUSP Inhale 2.5 mLs into the lungs in the morning and at bedtime. **refills to be sent to [facility]** 03/28/24  Yes Tamea Dedra CROME, MD  finasteride  (PROSCAR ) 5 MG tablet TAKE 1 TABLET(5 MG) BY MOUTH DAILY 05/15/24  Yes Bacigalupo, Jon HERO, MD  fluticasone  (FLONASE ) 50 MCG/ACT nasal spray SHAKE LIQUID AND USE 2 SPRAYS IN EACH NOSTRIL DAILY 05/15/24  Yes Wellington Beams A, FNP  gabapentin  (NEURONTIN ) 100 MG capsule TAKE 1 CAPSULE(100 MG) BY MOUTH THREE TIMES DAILY 04/25/24  Yes Clifton, Kellie A, FNP  gentamicin ointment (GARAMYCIN) 0.1 % Apply 1 Application topically 3 (three) times daily. 10/07/23  Yes [provider]  glucose blood (ACCU-CHEK GUIDE) test strip Use as instructed 12/16/20  Yes Bacigalupo, Angela M, MD  hydrOXYzine  (ATARAX ) 25 MG tablet Take 1 tablet (25 mg total) by mouth 3 (three) times daily. 09/22/23  Yes Wellington Beams LABOR, FNP  insulin  glargine (LANTUS  SOLOSTAR) 100 UNIT/ML Solostar Pen Inject 32 Units into the skin at bedtime. 02/15/24  Yes Bacigalupo, Jon HERO, MD  insulin  lispro (HUMALOG) 100 UNIT/ML KwikPen Junior 12 units at breakfast, 10 units at lunch and 12 units at supper time 04/16/21  Yes [provider]  isosorbide  mononitrate (IMDUR ) 30 MG 24 hr tablet TAKE 1/2 TABLET(15 MG) BY MOUTH DAILY 02/17/24  Yes End, Lonni, MD  Lactobacillus (PROBIOTIC ACIDOPHILUS PO) Take 1 capsule by mouth daily.    Yes [provider]  metFORMIN  (GLUCOPHAGE ) 1000 MG tablet TAKE 1 TABLET(1000 MG) BY MOUTH TWICE DAILY 05/15/24  Yes Bacigalupo, Angela M, MD  Nebulizers (COMPRESSOR/NEBULIZER) MISC Nebulizer Supplies 03/23/24  Yes Tamea Dedra CROME, MD  nitroGLYCERIN  (NITROSTAT ) 0.4 MG SL tablet Place 1 tablet (0.4 mg total) under the tongue every 5 (five) minutes as needed for chest pain. 12/29/22 05/23/24 Yes End, Lonni, MD   omeprazole  (PRILOSEC) 20 MG capsule TAKE 1 CAPSULE(20 MG) BY MOUTH DAILY 11/11/23  Yes Bacigalupo, Angela M, MD  oxybutynin  (DITROPAN -XL) 10 MG 24 hr tablet TAKE 1 TABLET(10 MG) BY MOUTH DAILY 05/10/24  Yes Bacigalupo, Angela M, MD  ramipril  (ALTACE ) 5 MG capsule TAKE 2 CAPSULES BY MOUTH EVERY MORNING AND 1 CAPSULE EVERY NIGHT AT BEDTIME 03/17/24  Yes Bacigalupo, Jon HERO, MD  rOPINIRole  (REQUIP ) 0.5 MG tablet Take 1 tablet (0.5 mg total) by mouth at bedtime. 05/23/24  Yes Bacigalupo, Jon HERO, MD  rosuvastatin  (CRESTOR ) 10 MG tablet TAKE 1 TABLET(10 MG) BY MOUTH DAILY 04/04/24  Yes Bacigalupo, Angela M, MD  Spacer/Aero-Holding Chambers (AEROCHAMBER PLS FLOVU MTHPIECE) DEVI Use as directed 09/10/23  Yes Tamea Dedra CROME, MD  tamsulosin  (FLOMAX ) 0.4 MG CAPS capsule TAKE 2 CAPSULES(0.8 MG) BY MOUTH DAILY 08/19/23  Yes Bacigalupo, Jon HERO, MD  TRULICITY  3 MG/0.5ML SOAJ 3 Milligram(s) SUB-Q Once a Week   Yes [provider]  zolpidem  (AMBIEN ) 10 MG tablet Take 1 tablet (10 mg total) by mouth at bedtime. 05/23/24  Yes Bacigalupo, Jon HERO, MD   No Known Allergies  FAMILY HISTORY:  family history includes  Gout in his father; Heart attack in his sister; Hypertension in his sister. SOCIAL HISTORY:  reports that he quit smoking about 15 years ago. His smoking use included cigarettes. He started smoking about 65 years ago. He has a 75 pack-year smoking history. He has never used smokeless tobacco. He reports that he does not currently use alcohol. He reports that he does not use drugs.   Review of Systems:  Gen:  Denies  fever, sweats, chills weight loss  HEENT: Denies blurred vision, double vision, ear pain, eye pain, hearing loss, nose bleeds, sore throat Cardiac:  No dizziness, chest pain or heaviness, chest tightness,edema, No JVD Resp:   No cough, -sputum production, -shortness of breath,-wheezing, -hemoptysis,  Gi: Denies swallowing difficulty, stomach pain, nausea or vomiting, diarrhea,  constipation, bowel incontinence Gu:  Denies bladder incontinence, burning urine Ext:   Denies Joint pain, stiffness or swelling Skin: Denies  skin rash, easy bruising or bleeding or hives Endoc:  Denies polyuria, polydipsia , polyphagia or weight change Psych:   Denies depression, insomnia or hallucinations  Other:  All other systems negative  VITAL SIGNS: BP 110/60 (BP Location: Right Arm, Patient Position: Sitting, Cuff Size: Normal)   Pulse (!) 103   Temp 99.1 F (37.3 C) (Oral)   Ht 5' 5 (1.651 m)   Wt 181 lb 9.6 oz (82.4 kg)   SpO2 90%   BMI 30.22 kg/m    Physical Examination:   General Appearance: No distress  EYES PERRLA, EOM intact.   NECK Supple, No JVD Pulmonary: normal breath sounds, No wheezing.  CardiovascularNormal S1,S2.  No m/r/g.   Abdomen: Benign, Soft, non-tender. Skin:   warm, no rashes, no ecchymosis  Extremities: normal, no cyanosis, clubbing. Neuro:without focal findings,  speech normal  PSYCHIATRIC: Mood, affect within normal limits.   ASSESSMENT AND PLAN  OSA I suspect that OSA is likely present due to clinical presentation. Discussed the consequences of untreated sleep apnea. Advised not to drive drowsy for safety of patient and others. Will complete further evaluation with in lab split night study and follow up to review results.    HTN Stable, on current management. Following with PCP.   COPD Will complete in lab sleep study and will titrate O2 to keep sat's between 88-92%.    MEDICATION ADJUSTMENTS/LABS AND TESTS ORDERED: Recommend Sleep Study   Patient  satisfied with Plan of action and management. All questions answered  Follow up to review in lab split night study results and treatment plan.   I spent a total of 47 minutes reviewing chart data, face-to-face evaluation with the patient, counseling and coordination of care as detailed above.    Demarion Pondexter, M.D.  Sleep Medicine [facility]

## 2024-05-23 NOTE — Progress Notes (Signed)
 Established patient visit   Patient: Billy Wise   DOB: 11-08-1945   78 y.o. Male  MRN: [id number] Visit Date: 05/23/2024  Today's healthcare provider: Jon Eva, MD   Chief Complaint  Patient presents with   Follow-up    4 wk f/u. No other concerns   Subjective    HPI HPI     Follow-up    Additional comments: 4 wk f/u. No other concerns      Last edited by Marylen Odella CROME, CMA on 05/23/2024  8:19 AM.       Discussed the use of AI scribe software for clinical note transcription with the patient, who gave verbal consent to proceed.  History of Present Illness   Billy Wise is a 78 year old male with restless legs syndrome and insomnia who presents for follow-up on medication adjustments.  Requip  and an iron supplement have significantly improved his restless legs syndrome, reducing cramps and limb movements. He initially took one Requip  pill per night for seven days, then increased to two pills per night. He continues with the iron supplement.  Insomnia persists despite the improvement in restless legs syndrome. There is no significant difference between regular and extended-release Ambien , as he continues to wake up every hour. He no longer attributes waking to bathroom needs. He describes himself as very active and unable to sit still, a lifelong trait.  Current medications include Ambien , Requip , and an iron supplement. For diabetes, he takes Farxiga , Lantus , Humalog, Metformin , and Trulicity . He uses a Libre device for blood sugar monitoring, which sometimes gives false low readings when he lays on it.         Medications: Outpatient Medications Prior to Visit  Medication Sig   Accu-Chek Softclix Lancets lancets Use to check blood sugar twice a day. DX E11.9   albuterol  (PROVENTIL ) (2.5 MG/3ML) 0.083% nebulizer solution Take 3 mLs (2.5 mg total) by nebulization every 6 (six) hours as needed for wheezing or shortness of breath.    albuterol  (VENTOLIN  HFA) 108 (90 Base) MCG/ACT inhaler INHALE 2 PUFFS BY MOUTH EVERY 6 HOURS AS NEEDED FOR WHEEZING AND SHORTNESS OF BREATH   amLODipine  (NORVASC ) 5 MG tablet TAKE 1 TABLET(5 MG) BY MOUTH DAILY   ascorbic acid (VITAMIN C) 1000 MG tablet Take 1 tablet by mouth daily.   benzonatate  (TESSALON ) 100 MG capsule Take 1 capsule (100 mg total) by mouth 2 (two) times daily as needed for cough.   Blood Glucose Monitoring Suppl (ACCU-CHEK AVIVA PLUS) w/Device KIT Use to check blood sugar twice a day.  DX E11.9   BREZTRI  AEROSPHERE 160-9-4.8 MCG/ACT AERO INHALE 2 PUFFS INTO THE LUNGS IN THE MORNING AND AT BEDTIME   Continuous Glucose Sensor (FREESTYLE LIBRE 3 PLUS SENSOR) MISC by Does not apply route. Change sensor every 15 days.   dapagliflozin  propanediol (FARXIGA ) 10 MG TABS tablet Take 1 tablet (10 mg total) by mouth daily.   Ensifentrine  (OHTUVAYRE ) 3 MG/2.5ML SUSP Inhale 2.5 mLs into the lungs in the morning and at bedtime. **refills to be sent to [facility]**   finasteride  (PROSCAR ) 5 MG tablet TAKE 1 TABLET(5 MG) BY MOUTH DAILY   fluticasone  (FLONASE ) 50 MCG/ACT nasal spray SHAKE LIQUID AND USE 2 SPRAYS IN EACH NOSTRIL DAILY   gabapentin  (NEURONTIN ) 100 MG capsule TAKE 1 CAPSULE(100 MG) BY MOUTH THREE TIMES DAILY   gentamicin ointment (GARAMYCIN) 0.1 % Apply 1 Application topically 3 (three) times daily.   glucose blood (ACCU-CHEK  GUIDE) test strip Use as instructed   hydrOXYzine  (ATARAX ) 25 MG tablet Take 1 tablet (25 mg total) by mouth 3 (three) times daily.   insulin  glargine (LANTUS  SOLOSTAR) 100 UNIT/ML Solostar Pen Inject 32 Units into the skin at bedtime.   insulin  lispro (HUMALOG) 100 UNIT/ML KwikPen Junior 12 units at breakfast, 10 units at lunch and 12 units at supper time   isosorbide  mononitrate (IMDUR ) 30 MG 24 hr tablet TAKE 1/2 TABLET(15 MG) BY MOUTH DAILY   Lactobacillus (PROBIOTIC ACIDOPHILUS PO) Take 1 capsule by mouth daily.    metFORMIN   (GLUCOPHAGE ) 1000 MG tablet TAKE 1 TABLET(1000 MG) BY MOUTH TWICE DAILY   Nebulizers (COMPRESSOR/NEBULIZER) MISC Nebulizer Supplies   nitroGLYCERIN  (NITROSTAT ) 0.4 MG SL tablet Place 1 tablet (0.4 mg total) under the tongue every 5 (five) minutes as needed for chest pain.   omeprazole  (PRILOSEC) 20 MG capsule TAKE 1 CAPSULE(20 MG) BY MOUTH DAILY   oxybutynin  (DITROPAN -XL) 10 MG 24 hr tablet TAKE 1 TABLET(10 MG) BY MOUTH DAILY   ramipril  (ALTACE ) 5 MG capsule TAKE 2 CAPSULES BY MOUTH EVERY MORNING AND 1 CAPSULE EVERY NIGHT AT BEDTIME   rosuvastatin  (CRESTOR ) 10 MG tablet TAKE 1 TABLET(10 MG) BY MOUTH DAILY   Spacer/Aero-Holding Chambers (AEROCHAMBER PLS FLOVU MTHPIECE) DEVI Use as directed   tamsulosin  (FLOMAX ) 0.4 MG CAPS capsule TAKE 2 CAPSULES(0.8 MG) BY MOUTH DAILY   TRULICITY  3 MG/0.5ML SOAJ 3 Milligram(s) SUB-Q Once a Week   [DISCONTINUED] Insulin  Pen Needle (PEN NEEDLES) 31G X 5 MM MISC 1 each by Does not apply route daily as needed.   [DISCONTINUED] rOPINIRole  (REQUIP ) 0.25 MG tablet Take 2 tablets (0.5 mg total) by mouth at bedtime.   [DISCONTINUED] zolpidem  (AMBIEN  CR) 12.5 MG CR tablet Take 1 tablet (12.5 mg total) by mouth at bedtime as needed for sleep.   Facility-Administered Medications Prior to Visit  Medication Dose Route Frequency Provider   sodium chloride  flush (NS) 0.9 % injection 3 mL  3 mL Intravenous Q12H Gollan, Timothy J, MD    Review of Systems     Objective    BP 119/80 (BP Location: Right Arm, Patient Position: Sitting, Cuff Size: Normal)   Pulse 85   Resp 16   Ht 5' 5 (1.651 m)   Wt 181 lb (82.1 kg)   SpO2 95%   BMI 30.12 kg/m    Physical Exam Vitals reviewed.  Constitutional:      General: He is not in acute distress.    Appearance: Normal appearance. He is not diaphoretic.  HENT:     Head: Normocephalic and atraumatic.  Eyes:     General: No scleral icterus.    Conjunctiva/sclera: Conjunctivae normal.  Cardiovascular:     Rate and Rhythm:  Normal rate and regular rhythm.     Heart sounds: Normal heart sounds. No murmur heard. Pulmonary:     Effort: Pulmonary effort is normal. No respiratory distress.     Breath sounds: Normal breath sounds. No wheezing or rhonchi.  Musculoskeletal:     Cervical back: Neck supple.     Right lower leg: No edema.     Left lower leg: No edema.  Lymphadenopathy:     Cervical: No cervical adenopathy.  Skin:    General: Skin is warm and dry.     Findings: No rash.  Neurological:     Mental Status: He is alert and oriented to person, place, and time. Mental status is at baseline.  Psychiatric:  Mood and Affect: Mood normal.        Behavior: Behavior normal.      No results found for any visits on 05/23/24.  Assessment & Plan     Problem List Items Addressed This Visit       Cardiovascular and Mediastinum   Other secondary pulmonary hypertension (HCC)   F/b Pulm No changes today        Endocrine   Type 2 diabetes mellitus with diabetic neuropathy (HCC)   Type 2 diabetes is managed with Farxiga , Lantus , Humalog, Metformin , and Trulicity . Blood glucose levels are monitored with a Libre device, which occasionally gives false low readings. Reports no episodes of hypoglycemia and is generally satisfied with the current management. A1c was last checked three months ago, and endocrinology predicted it to be around 6.7%. - Check A1c at the next visit in October - Continue current diabetes medications: Farxiga , Lantus , Humalog, Metformin , and Trulicity          Other   Insomnia   Insomnia persists despite trial of extended-release Ambien . Reports no improvement with the time-release formulation and prefers the original Ambien . Long-term use of Ambien  may have led to reduced effectiveness. Consideration of referral to a sleep specialist for further evaluation and management of insomnia and periodic limb movement disorder. - Switch back to original Ambien  formulation - Refer to sleep  specialist in Dr. Evalyn office for further evaluation and management      Relevant Orders   Ambulatory referral to Pulmonology   Restless leg syndrome   Restless legs syndrome has improved significantly with Requip  and iron supplementation. No longer experiencing cramps. Iron deficiency is being addressed with supplements, but levels have not been rechecked yet. - Continue Requip  at 0.5 mg nightly - Continue iron supplementation - Plan to recheck iron levels in the future      Other Visit Diagnoses       Low ferritin    -  Primary        Return in about 3 months (around 08/23/2024) for CPE, as scheduled.       Jon Eva, MD  [facility] [phone] (phone) [phone] (fax)  [facility]

## 2024-05-23 NOTE — Assessment & Plan Note (Signed)
 Type 2 diabetes is managed with Farxiga , Lantus , Humalog, Metformin , and Trulicity . Blood glucose levels are monitored with a Libre device, which occasionally gives false low readings. Reports no episodes of hypoglycemia and is generally satisfied with the current management. A1c was last checked three months ago, and endocrinology predicted it to be around 6.7%. - Check A1c at the next visit in October - Continue current diabetes medications: Farxiga , Lantus , Humalog, Metformin , and Trulicity 

## 2024-05-23 NOTE — Assessment & Plan Note (Signed)
 Restless legs syndrome has improved significantly with Requip  and iron supplementation. No longer experiencing cramps. Iron deficiency is being addressed with supplements, but levels have not been rechecked yet. - Continue Requip  at 0.5 mg nightly - Continue iron supplementation - Plan to recheck iron levels in the future

## 2024-05-23 NOTE — Assessment & Plan Note (Signed)
 Insomnia persists despite trial of extended-release Ambien . Reports no improvement with the time-release formulation and prefers the original Ambien . Long-term use of Ambien  may have led to reduced effectiveness. Consideration of referral to a sleep specialist for further evaluation and management of insomnia and periodic limb movement disorder. - Switch back to original Ambien  formulation - Refer to sleep specialist in Dr. Evalyn office for further evaluation and management

## 2024-06-05 DIAGNOSIS — E1121 Type 2 diabetes mellitus with diabetic nephropathy: Secondary | ICD-10-CM | POA: Diagnosis not present

## 2024-06-10 ENCOUNTER — Other Ambulatory Visit: Payer: Self-pay | Admitting: Family Medicine

## 2024-06-12 NOTE — Telephone Encounter (Signed)
 Change in dosing and directions of this medication Requested Prescriptions  Pending Prescriptions Disp Refills   rOPINIRole  (REQUIP ) 0.25 MG tablet [Pharmacy Med Name: ROPINIROLE  0.25MG  TABLETS] 60 tablet 0    Sig: TAKE 2 TABLETS(0.5 MG) BY MOUTH AT BEDTIME     Neurology:  Parkinsonian Agents Passed - 06/12/2024  3:50 PM      Passed - Last BP in normal range    BP Readings from Last 1 Encounters:  05/23/24 110/60         Passed - Last Heart Rate in normal range    Pulse Readings from Last 1 Encounters:  05/23/24 (!) 103         Passed - Valid encounter within last 12 months    Recent Outpatient Visits           2 weeks ago Low ferritin   [facility] [place], Jon HERO, MD   1 month ago Insomnia, unspecified type   [facility] [place], Jon HERO, MD   3 months ago Diabetes mellitus with proteinuria [facility])   [place] [facility] Bacigalupo, Jon HERO, MD       Future Appointments             In 2 months Bacigalupo, Jon HERO, MD [facility]

## 2024-06-14 DIAGNOSIS — L570 Actinic keratosis: Secondary | ICD-10-CM | POA: Diagnosis not present

## 2024-06-14 DIAGNOSIS — L821 Other seborrheic keratosis: Secondary | ICD-10-CM | POA: Diagnosis not present

## 2024-06-14 DIAGNOSIS — Z872 Personal history of diseases of the skin and subcutaneous tissue: Secondary | ICD-10-CM | POA: Diagnosis not present

## 2024-06-14 DIAGNOSIS — I10 Essential (primary) hypertension: Secondary | ICD-10-CM | POA: Diagnosis not present

## 2024-06-14 DIAGNOSIS — D229 Melanocytic nevi, unspecified: Secondary | ICD-10-CM | POA: Diagnosis not present

## 2024-06-14 DIAGNOSIS — Z85828 Personal history of other malignant neoplasm of skin: Secondary | ICD-10-CM | POA: Diagnosis not present

## 2024-06-14 DIAGNOSIS — L814 Other melanin hyperpigmentation: Secondary | ICD-10-CM | POA: Diagnosis not present

## 2024-06-15 DIAGNOSIS — E1121 Type 2 diabetes mellitus with diabetic nephropathy: Secondary | ICD-10-CM | POA: Diagnosis not present

## 2024-06-15 DIAGNOSIS — Z794 Long term (current) use of insulin: Secondary | ICD-10-CM | POA: Diagnosis not present

## 2024-06-15 DIAGNOSIS — R809 Proteinuria, unspecified: Secondary | ICD-10-CM | POA: Diagnosis not present

## 2024-06-15 DIAGNOSIS — I10 Essential (primary) hypertension: Secondary | ICD-10-CM | POA: Diagnosis not present

## 2024-06-15 DIAGNOSIS — E1129 Type 2 diabetes mellitus with other diabetic kidney complication: Secondary | ICD-10-CM | POA: Diagnosis not present

## 2024-06-15 DIAGNOSIS — N181 Chronic kidney disease, stage 1: Secondary | ICD-10-CM | POA: Diagnosis not present

## 2024-07-11 ENCOUNTER — Other Ambulatory Visit: Payer: Self-pay | Admitting: Family Medicine

## 2024-07-14 ENCOUNTER — Ambulatory Visit: Attending: Sleep Medicine

## 2024-07-14 DIAGNOSIS — G4736 Sleep related hypoventilation in conditions classified elsewhere: Secondary | ICD-10-CM | POA: Diagnosis not present

## 2024-07-14 DIAGNOSIS — G4733 Obstructive sleep apnea (adult) (pediatric): Secondary | ICD-10-CM | POA: Diagnosis not present

## 2024-07-15 DIAGNOSIS — R0902 Hypoxemia: Secondary | ICD-10-CM

## 2024-07-24 DIAGNOSIS — E1121 Type 2 diabetes mellitus with diabetic nephropathy: Secondary | ICD-10-CM | POA: Diagnosis not present

## 2024-07-24 DIAGNOSIS — Z794 Long term (current) use of insulin: Secondary | ICD-10-CM | POA: Diagnosis not present

## 2024-07-25 ENCOUNTER — Encounter: Payer: Self-pay | Admitting: Family Medicine

## 2024-07-27 ENCOUNTER — Ambulatory Visit: Admitting: Family Medicine

## 2024-07-27 ENCOUNTER — Ambulatory Visit (INDEPENDENT_AMBULATORY_CARE_PROVIDER_SITE_OTHER): Payer: Self-pay | Admitting: Sleep Medicine

## 2024-07-27 ENCOUNTER — Encounter: Payer: Self-pay | Admitting: Family Medicine

## 2024-07-27 VITALS — BP 125/72 | HR 102 | Ht 64.0 in | Wt 181.5 lb

## 2024-07-27 DIAGNOSIS — R140 Abdominal distension (gaseous): Secondary | ICD-10-CM | POA: Diagnosis not present

## 2024-07-27 DIAGNOSIS — K5903 Drug induced constipation: Secondary | ICD-10-CM | POA: Diagnosis not present

## 2024-07-27 DIAGNOSIS — K219 Gastro-esophageal reflux disease without esophagitis: Secondary | ICD-10-CM

## 2024-07-27 DIAGNOSIS — G2581 Restless legs syndrome: Secondary | ICD-10-CM | POA: Diagnosis not present

## 2024-07-27 MED ORDER — ROPINIROLE HCL 1 MG PO TABS: 1.0000 mg | ORAL_TABLET | Freq: Every day | ORAL | 1 refills | Status: AC

## 2024-07-27 NOTE — Progress Notes (Signed)
 Acute visit   Patient: Billy Wise   DOB: [birth year]   78 y.o. Male  MRN: [id number] PCP: Myrla Jon HERO, MD   Chief Complaint  Patient presents with   Acute Visit   GI Problem    Pt is present due to concerns with a lot of gas with all meals, bloated, tightness center of chest.stools start hard and then gets liquid X 3 weeks. drinking mylanta for treatment that is helping for a little while. He reports symptoms are consistent daily.    Subjective    Discussed the use of AI scribe software for clinical note transcription with the patient, who gave verbal consent to proceed.  History of Present Illness   Billy Wise is a 78 year old male who presents with gastrointestinal symptoms including gas, bloating, and alternating constipation and diarrhea.  He experiences excessive gas with meals, bloating, tightness in the center of the chest, and alternating constipation and diarrhea. He follows a cycle of taking Mylanta, leading to constipation, and then using Imodium, resulting in diarrhea. These symptoms have worsened over the past month.  His endocrinologist recently increased his Trulicity  dose from 1.5 mg to 3.5 mg within the last month. He also takes metformin  for diabetes management. He manages his symptoms with fiber pills and probiotics, taking one fiber pill daily.  He has no blood in the stool or fevers. His abdomen feels bloated, and symptoms have been particularly bothersome over the past month.  He has sleep issues with a recent study showing 197 leg movements, 46 of which were waking leg movements. He uses oxygen  at night and takes Requip  for restless legs, which has provided some improvement. He scratches holes in his sheets due to leg movements.  His current medications include Trulicity , metformin , fiber pills, probiotics, and Requip . He also uses oxygen  at night.        Review of Systems  Objective    BP 125/72 (BP Location: Left Arm,  Patient Position: Sitting, Cuff Size: Normal)   Pulse (!) 102   Ht 5' 4 (1.626 m)   Wt 181 lb 8 oz (82.3 kg)   SpO2 95%   BMI 31.15 kg/m  Physical Exam Vitals reviewed.  Constitutional:      General: He is not in acute distress.    Appearance: Normal appearance. He is not diaphoretic.  HENT:     Head: Normocephalic and atraumatic.  Eyes:     General: No scleral icterus.    Conjunctiva/sclera: Conjunctivae normal.  Cardiovascular:     Rate and Rhythm: Normal rate and regular rhythm.     Heart sounds: Normal heart sounds. No murmur heard. Pulmonary:     Effort: Pulmonary effort is normal. No respiratory distress.     Breath sounds: Normal breath sounds. No wheezing or rhonchi.  Abdominal:     General: Bowel sounds are normal. There is distension.     Palpations: There is no mass.     Tenderness: There is no abdominal tenderness. There is no guarding or rebound.  Musculoskeletal:     Cervical back: Neck supple.     Right lower leg: No edema.     Left lower leg: No edema.  Lymphadenopathy:     Cervical: No cervical adenopathy.  Skin:    General: Skin is warm and dry.     Findings: No rash.  Neurological:     Mental Status: He is alert and oriented to person, place, and time.  Mental status is at baseline.  Psychiatric:        Mood and Affect: Mood normal.        Behavior: Behavior normal.       No results found for any visits on 07/27/24.  Assessment & Plan     Problem List Items Addressed This Visit       Digestive   Gastroesophageal reflux disease without esophagitis     Other   Restless leg syndrome   Other Visit Diagnoses       Abdominal bloating    -  Primary     Drug-induced constipation               Gastrointestinal symptoms due to Trulicity  (dulaglutide ) Gastrointestinal symptoms including gas, bloating, constipation, and diarrhea likely due to increased dose of Trulicity . Symptoms include belching, reflux, and chest pressure, starting  approximately one month ago after dose increase. Trulicity  is known to cause constipation and subsequent diarrhea due to overflow. Symptoms are persistent and bothersome, correlating with the recent increase in Trulicity  dosage. - Increase fiber supplement to two pills daily. - Start Pepcid (famotidine) 20 mg twice daily to manage belching, reflux, and chest pressure. - Advise smaller meals and portion control to mitigate symptoms. - Avoid using Imodium unless necessary due to risk of worsening constipation. - Consider contacting endocrinologist to discuss Trulicity  dose and potential alternatives if symptoms persist. - Consider Mounjaro as an alternative if unable to tolerate current Trulicity  dose.  Restless legs syndrome Restless legs syndrome with significant leg movements during sleep, as evidenced by sleep study showing 197 leg movements with 46 waking leg movements. Current treatment with Requip  (ropinirole ) has provided some improvement but not complete resolution of symptoms. - Increase Requip  (ropinirole ) to 1 mg nightly. If current supply of 0.5 mg tablets is available, take two tablets nightly until depleted. - Prescribe 90-day supply of 1 mg Requip  to [facility] at [facility]. - Monitor for improvement in symptoms and adjust dose as needed, with potential to increase up to 4 mg nightly.        Meds ordered this encounter  Medications   rOPINIRole  (REQUIP ) 1 MG tablet    Sig: Take 1 tablet (1 mg total) by mouth at bedtime.    Dispense:  90 tablet    Refill:  1     Return for as scheduled.      Jon Eva, MD  [facility] [phone] (phone) [phone] (fax)  [facility]

## 2024-07-27 NOTE — Telephone Encounter (Signed)
 Noted. Nothing further needed.

## 2024-07-28 ENCOUNTER — Encounter: Payer: Self-pay | Admitting: Pulmonary Disease

## 2024-07-28 ENCOUNTER — Ambulatory Visit: Admitting: Pulmonary Disease

## 2024-07-28 VITALS — BP 100/62 | HR 72 | Temp 97.7°F | Ht 64.0 in | Wt 180.8 lb

## 2024-07-28 DIAGNOSIS — J479 Bronchiectasis, uncomplicated: Secondary | ICD-10-CM | POA: Diagnosis not present

## 2024-07-28 DIAGNOSIS — J9611 Chronic respiratory failure with hypoxia: Secondary | ICD-10-CM | POA: Diagnosis not present

## 2024-07-28 DIAGNOSIS — J449 Chronic obstructive pulmonary disease, unspecified: Secondary | ICD-10-CM | POA: Diagnosis not present

## 2024-07-28 DIAGNOSIS — Z87891 Personal history of nicotine dependence: Secondary | ICD-10-CM

## 2024-07-28 DIAGNOSIS — J418 Mixed simple and mucopurulent chronic bronchitis: Secondary | ICD-10-CM

## 2024-07-28 NOTE — Patient Instructions (Signed)
 VISIT SUMMARY:  Today, you came in for a routine follow-up for your COPD. We discussed your current medications and recent vaccinations.  YOUR PLAN:  -CHRONIC OBSTRUCTIVE PULMONARY DISEASE (COPD): COPD is a chronic lung condition that makes it hard to breathe. You are currently managing your symptoms well with Breztri , Ohtuvayre , and Albuterol . Continue taking Breztri  twice daily, Ohtuvayre  twice daily, and Albuterol  before bed. Your vaccinations for flu, COVID, and pneumonia are up to date, which is important for preventing infections.  INSTRUCTIONS:  Please continue with your current medication regimen and ensure your vaccinations remain up to date. If you experience any changes in your symptoms or have concerns, schedule a sooner follow-up appointment.  Otherwise, we will see you  in 6 months.

## 2024-07-28 NOTE — Progress Notes (Signed)
 Subjective:    Patient ID: Billy Wise, male    DOB: 04-21-1946, 78 y.o.   MRN: [id number]  Patient Care Team: Myrla Jon HERO, MD as PCP - General (Family Medicine) Rumalda Nancyann Bosworth, MD as Referring Physician (Endocrinology) Lucienne Tinnie KATHEE DELENA, MD as Referring Physician (Dermatology) Mittie Gaskin, MD as Referring Physician (Ophthalmology) Tamea Dedra CROME, MD as Consulting Physician (Pulmonary Disease)  Chief Complaint  Patient presents with   COPD    Shortness of breath on exertion.     BACKGROUND/INTERVAL:Mr. Mcmanaman is a 78 year old former smoker who follows here for the issue of COPD with chronic hypoxic respiratory failure due to the same.  He was last seen on a follow-up visit on 28 March 2024.  He was started on Ohtuvayre  on 19 August 2023.  He has participated in pulmonary rehab previously.   HPI Discussed the use of AI scribe software for clinical note transcription with the patient, who gave verbal consent to proceed.  History of Present Illness   Billy Wise is a 78 year old male with COPD who presents for routine follow-up.  He is currently using Breztri  twice daily, 2 puffs in the morning and 2 puffs supper time. He also uses Ohtuvayre  every morning, waiting about fifteen minutes between the two medications. Additionally, he uses Albuterol  before going to bed.  He received his flu shot, COVID vaccine, and pneumonia shots last Sunday. He is concerned about the upcoming winter season, indicating that he is not looking forward to it.  He is enrolled in lung cancer screening CT.  We reviewed the images from his most recent CT in July, no lung nodules of concern.     DATA: CBC 10/28/17>>eosinophils equals 0. Sleep Study 11/27/17>> no sleep apnea.  Nocturnal desaturations noted. Desat walk 11/15/17>> baseline sat on RA at rest was 91% and HR 89. Walked 360 feet at a very brisk pace, was conversational. Sat was 88% and HR 112.  Moderate  dyspnea. CT chest personally reviewed>> CT hi-res  02/01/18; chest x-ray 10/28/17; there is mild basilar fibrosis, bronchiectatic changes with bronchial thickening, severe apical emphysema. Findings not likely due to UIP.  PFT tracings personally reviewed from 02/01/18>> FVC is 86% predicted, FEV1 is 67% predicted, there is no improvement with bronchodilator.  Ratio 61%. DLCO is reduced at 52%.Flow volume loop is obstructive. Ambulatory oximetry 07/19/2020: Desaturations with exercise, initiated oxygen  at 2 L/min 2D echocardiogram 07/26/2020: LVEF 60 to 65%, grade 1 DD, moderate elevation on pulmonary artery systolic pressure. PFTs 07/31/2020: FEV1 1.57 L or 57% predicted, FVC 2.79 L or 74% predicted.  FEV1/FVC 56%.  Postbronchodilator there is a 10% change in FEV1 lung volumes showed mild air trapping no hyperinflation, diffusion capacity moderately reduced.  Consistent with COPD/emphysema.  Flow volume loop is delayed consistent with obstructive lung disease. Alpha 1 antitrypsin 12/15/2022: MM phenotype, level 128 mg/dL PFTs 93/81/7975: FEV1 8.07 L or 51% predicted, FVC 2.55 L or 73% predicted, FEV1/FVC 51%, no significant bronchodilator response.  Lung volumes show air trapping.  Flow volume loop is consistent with obstruction.  Diffusion capacity is severely impaired.  This is consistent with COPD on the basis of emphysema.  As the prior study of 31 July 2020 there has been minimal decline in function. CT chest low-dose 05/04/2023: Lung RADS 2 benign appearance and behavior.  Bilateral lower lobe cylindrical bronchiectasis.  Moderate emphysema.  Prior pulmonary nodules resolve indicating inflammatory/infectious cause. Chest x-ray PA and lateral 07/13/2023: No active cardiopulmonary disease.  Coarsened interstitial markings. PFTs 03/28/2024: FEV1 1.42 L or 58% predicted, FVC 2.81 L or 82% predicted, FEV1/FVC 51% no bronchodilator response, lung volumes show mild hyperinflation and air trapping.   Diffusion capacity moderately reduced.  Overall modest improvement from 6/24 PFTs.  Consistent with COPD on the basis of emphysema. 05/05/2024 LDCT chest: Moderate paraseptal and centrilobular emphysema, diffuse bronchial wall thickening.  Moderate bilateral lower lobe bronchiectasis left greater than right.  Lung nodules unchanged.  Lung RADS 2.  Coronary atherosclerosis as prior.  Review of Systems A 10 point review of systems was performed and it is as noted above otherwise negative.   Patient Active Problem List   Diagnosis Date Noted   Restless leg syndrome 04/18/2024   Numbness and tingling of both feet 09/22/2023   Chronic rhinosinusitis 09/22/2023   Type 2 diabetes mellitus with diabetic neuropathy (HCC) 09/22/2023   Anxiety 07/31/2023   Accelerating angina (HCC) 12/29/2022   Coronary artery disease of native artery of native heart with stable angina pectoris 11/05/2022   Aortic atherosclerosis 11/05/2022   Senile purpura 08/14/2022   Diabetes mellitus with proteinuria (HCC) 08/14/2022   Chronic hypoxic respiratory failure, on home oxygen  therapy (HCC) 08/07/2021   Venous stasis 08/07/2021   Other secondary pulmonary hypertension (HCC) 07/26/2020   Diastolic dysfunction 07/26/2020   Obesity 10/25/2019   History of anemia 04/20/2019   Gastroesophageal reflux disease without esophagitis 09/09/2018   Hyperlipidemia associated with type 2 diabetes mellitus (HCC) 09/09/2018   COPD (chronic obstructive pulmonary disease) (HCC) 10/28/2017   Hypertension associated with diabetes (HCC) 10/28/2017   Insomnia 09/03/2016   BPH with obstruction/lower urinary tract symptoms 12/16/2015    Social History   Tobacco Use   Smoking status: Former    Current packs/day: 0.00    Average packs/day: 1.5 packs/day for 50.0 years (75.0 ttl pk-yrs)    Types: Cigarettes    Start date: 09/29/1958    Quit date: 09/29/2008    Years since quitting: 15.8   Smokeless tobacco: Never  Substance Use  Topics   Alcohol use: Not Currently    Comment: 2 drinks a month / cocktail    No Known Allergies  Current Meds  Medication Sig   Accu-Chek Softclix Lancets lancets Use to check blood sugar twice a day. DX E11.9   albuterol  (PROVENTIL ) (2.5 MG/3ML) 0.083% nebulizer solution Take 3 mLs (2.5 mg total) by nebulization every 6 (six) hours as needed for wheezing or shortness of breath.   albuterol  (VENTOLIN  HFA) 108 (90 Base) MCG/ACT inhaler INHALE 2 PUFFS BY MOUTH EVERY 6 HOURS AS NEEDED FOR WHEEZING AND SHORTNESS OF BREATH   amLODipine  (NORVASC ) 5 MG tablet TAKE 1 TABLET(5 MG) BY MOUTH DAILY   ascorbic acid (VITAMIN C) 1000 MG tablet Take 1 tablet by mouth daily.   aspirin  81 MG chewable tablet Chew 81 mg by mouth daily.   benzonatate  (TESSALON ) 100 MG capsule Take 1 capsule (100 mg total) by mouth 2 (two) times daily as needed for cough.   Blood Glucose Monitoring Suppl (ACCU-CHEK AVIVA PLUS) w/Device KIT Use to check blood sugar twice a day.  DX E11.9   BREZTRI  AEROSPHERE 160-9-4.8 MCG/ACT AERO INHALE 2 PUFFS INTO THE LUNGS IN THE MORNING AND AT BEDTIME   Continuous Glucose Sensor (FREESTYLE LIBRE 3 PLUS SENSOR) MISC by Does not apply route. Change sensor every 15 days.   dapagliflozin  propanediol (FARXIGA ) 10 MG TABS tablet Take 1 tablet (10 mg total) by mouth daily.   DROPLET PEN NEEDLES 32G X  4 MM MISC 4 (four) times daily.   Ensifentrine  (OHTUVAYRE ) 3 MG/2.5ML SUSP Inhale 2.5 mLs into the lungs in the morning and at bedtime. **refills to be sent to [facility]**   finasteride  (PROSCAR ) 5 MG tablet TAKE 1 TABLET(5 MG) BY MOUTH DAILY   fluticasone  (FLONASE ) 50 MCG/ACT nasal spray SHAKE LIQUID AND USE 2 SPRAYS IN EACH NOSTRIL DAILY   gabapentin  (NEURONTIN ) 100 MG capsule TAKE 1 CAPSULE(100 MG) BY MOUTH THREE TIMES DAILY   gentamicin ointment (GARAMYCIN) 0.1 % Apply 1 Application topically 3 (three) times daily.   glucose blood (ACCU-CHEK GUIDE) test strip Use as instructed    hydrOXYzine  (ATARAX ) 25 MG tablet Take 1 tablet (25 mg total) by mouth 3 (three) times daily.   insulin  glargine (LANTUS  SOLOSTAR) 100 UNIT/ML Solostar Pen Inject 32 Units into the skin at bedtime.   insulin  lispro (HUMALOG) 100 UNIT/ML KwikPen Junior 12 units at breakfast, 10 units at lunch and 12 units at supper time   isosorbide  mononitrate (IMDUR ) 30 MG 24 hr tablet TAKE 1/2 TABLET(15 MG) BY MOUTH DAILY   Lactobacillus (PROBIOTIC ACIDOPHILUS PO) Take 1 capsule by mouth daily.    metFORMIN  (GLUCOPHAGE ) 1000 MG tablet TAKE 1 TABLET(1000 MG) BY MOUTH TWICE DAILY   Nebulizers (COMPRESSOR/NEBULIZER) MISC Nebulizer Supplies   nitroGLYCERIN  (NITROSTAT ) 0.4 MG SL tablet Place 1 tablet (0.4 mg total) under the tongue every 5 (five) minutes as needed for chest pain.   omeprazole  (PRILOSEC) 20 MG capsule TAKE 1 CAPSULE(20 MG) BY MOUTH DAILY   oxybutynin  (DITROPAN -XL) 10 MG 24 hr tablet TAKE 1 TABLET(10 MG) BY MOUTH DAILY   ramipril  (ALTACE ) 5 MG capsule TAKE 2 CAPSULES BY MOUTH EVERY MORNING AND 1 CAPSULE EVERY NIGHT AT BEDTIME   rOPINIRole  (REQUIP ) 1 MG tablet Take 1 tablet (1 mg total) by mouth at bedtime.   rosuvastatin  (CRESTOR ) 10 MG tablet TAKE 1 TABLET(10 MG) BY MOUTH DAILY   Spacer/Aero-Holding Chambers (AEROCHAMBER PLS FLOVU MTHPIECE) DEVI Use as directed   tamsulosin  (FLOMAX ) 0.4 MG CAPS capsule TAKE 2 CAPSULES(0.8 MG) BY MOUTH DAILY   zolpidem  (AMBIEN ) 10 MG tablet Take 1 tablet (10 mg total) by mouth at bedtime.   [DISCONTINUED] TRULICITY  3 MG/0.5ML SOAJ 3 Milligram(s) SUB-Q Once a Week    Immunization History  Administered Date(s) Administered    sv, Bivalent, Protein Subunit Rsvpref,pf (Abrysvo) 09/05/2022   Fluad Quad(high Dose 65+) 08/05/2020, 08/02/2022, 08/02/2022   INFLUENZA, HIGH DOSE SEASONAL PF 07/24/2015, 07/21/2016, 08/02/2017, 08/06/2017, 07/27/2018   Influenza,inj,Quad PF,6+ Mos 06/27/2019   Influenza-Unspecified 09/14/2012, 07/19/2021, 08/08/2023   Moderna SARS-COV2  Booster Vaccination 07/06/2021   PFIZER(Purple Top)SARS-COV-2 Vaccination 11/10/2019, 12/01/2019, 07/14/2020   PNEUMOCOCCAL CONJUGATE-20 01/12/2022   Pfizer Covid-19 Vaccine Bivalent Booster 76yrs & up 01/26/2021   Pfizer(Comirnaty)Fall Seasonal Vaccine 12 years and older 08/02/2022   Pneumococcal Conjugate-13 08/25/2014   Pneumococcal Polysaccharide-23 01/26/2016   Pneumococcal-Unspecified 12/17/2005   Tdap 12/17/2005, 02/25/2016   Zoster Recombinant(Shingrix) 08/23/2021, 06/05/2022   Zoster, Live 11/24/2013        Objective:     BP 100/62   Pulse 72   Temp 97.7 F (36.5 C) (Temporal)   Ht 5' 4 (1.626 m)   Wt 180 lb 12.8 oz (82 kg)   SpO2 93% Comment: on room air  BMI 31.03 kg/m   SpO2: 93 % (on room air)  GENERAL: Awake, alert, no respiratory distress.  Well-appearing, fully ambulatory.  Uses POC with activity. HEAD: Normocephalic, atraumatic.  EYES: Pupils equal, round, reactive to light.  No scleral icterus.  MOUTH: Oral mucosa moist.  Dentition intact. NECK: Supple. No thyromegaly. Trachea midline. No JVD.  No adenopathy. PULMONARY: Good air entry bilaterally.  Faint dry crackles at bases (chronic), no rhonchi or wheezes. CARDIOVASCULAR: S1 and S2.  No rubs, murmurs or gallops heard. ABDOMEN: Protuberant, truncal obesity, otherwise benign. MUSCULOSKELETAL: No joint deformity, no clubbing, trace edema.  NEUROLOGIC: No deficits, no gait disturbance, speech is fluent. SKIN: Intact,warm,dry.  Bilateral stasis changes lower extremities. PSYCH: Mood and behavior normal.    Assessment & Plan:     ICD-10-CM   1. COPD, moderate (HCC) Moderate/Severe  J44.9     2. Mixed simple and mucopurulent chronic bronchitis (HCC)  J41.8     3. Chronic respiratory failure with hypoxia (HCC)  J96.11     4. Bronchiectasis without complication (HCC)  J47.9      Discussion:    Chronic obstructive pulmonary disease (COPD) He reports effective symptom control with Breztri ,  Ohtuvayre , and Albuterol . Vaccinations for flu, COVID, and pneumonia are up to date, providing essential preventive care. - Continue Breztri  twice daily - Continue Ohtuvayre  every morning and evening - Continue Albuterol  before bed - Ensure vaccinations remain up to date, including flu, COVID, and pneumonia     Bronchiectasis without complication Continue management as above.  Pulmonary hygiene.  Follow-up in 6 months time call sooner should any new problems arise.  Advised if symptoms do not improve or worsen, to please contact office for sooner follow up or seek emergency care.    I spent 24 minutes of dedicated to the care of this patient on the date of this encounter to include pre-visit review of records, face-to-face time with the patient discussing conditions above, post visit ordering of testing, clinical documentation with the electronic health record, making appropriate referrals as documented, and communicating necessary findings to members of the patients care team.     C. Leita Sanders, MD Advanced Bronchoscopy PCCM [facility]    *This note was generated using voice recognition software/Dragon and/or AI transcription program.  Despite best efforts to proofread, errors can occur which can change the meaning. Any transcriptional errors that result from this process are unintentional and may not be fully corrected at the time of dictation.

## 2024-08-02 NOTE — Telephone Encounter (Signed)
 I spoke with the patient. He said his PCP is already checking those labs.  Dr. Jess this is just an FYI.  Nothing further needed.

## 2024-08-03 ENCOUNTER — Other Ambulatory Visit: Payer: Self-pay | Admitting: Pulmonary Disease

## 2024-08-03 DIAGNOSIS — J449 Chronic obstructive pulmonary disease, unspecified: Secondary | ICD-10-CM

## 2024-08-03 NOTE — Telephone Encounter (Signed)
 Copied from CRM [id number]. Topic: Clinical - Medication Refill >> Aug 03, 2024  4:00 PM Abigail D wrote: Medication: Ensifentrine  (OHTUVAYRE ) 3 MG/2.5ML SUSP  Has the patient contacted their pharmacy? Yes, pharmacy request (Agent: If no, request that the patient contact the pharmacy for the refill. If patient does not wish to contact the pharmacy document the reason why and proceed with request.) (Agent: If yes, when and what did the pharmacy advise?)  This is the patient's preferred pharmacy:   [facility] - [place], [place] - [address] [place] [address] Phone: [phone] Fax: [phone]  Is this the correct pharmacy for this prescription? Yes If no, delete pharmacy and type the correct one.   Has the prescription been filled recently? No  Is the patient out of the medication? Yes  Has the patient been seen for an appointment in the last year OR does the patient have an upcoming appointment? Yes  Can we respond through MyChart? No  Agent: Please be advised that Rx refills may take up to 3 business days. We ask that you follow-up with your pharmacy.

## 2024-08-04 NOTE — Telephone Encounter (Signed)
 Refill sent for OHTUVAYRE  to [facility]: [phone]  Dose: 3mg /2.57mL (Inhale 2.68mLs into the lungs in the morning and at bedtime)  Last OV: 07/28/24 Provider: Dr. Tamea  Next OV: due Apr 2026, not yet scheduled  Aleck Puls, PharmD, BCPS Clinical Pharmacist  [facility]

## 2024-08-09 ENCOUNTER — Other Ambulatory Visit: Payer: Self-pay | Admitting: Internal Medicine

## 2024-08-09 ENCOUNTER — Other Ambulatory Visit: Payer: Self-pay | Admitting: Family Medicine

## 2024-08-09 DIAGNOSIS — E1159 Type 2 diabetes mellitus with other circulatory complications: Secondary | ICD-10-CM

## 2024-08-10 ENCOUNTER — Other Ambulatory Visit: Payer: Self-pay | Admitting: Pulmonary Disease

## 2024-08-10 ENCOUNTER — Telehealth: Payer: Self-pay | Admitting: Family Medicine

## 2024-08-10 NOTE — Telephone Encounter (Signed)
[facility] faxed refill request for the following medications:  omeprazole (PRILOSEC) 20 MG capsule     Please advise.  

## 2024-08-11 NOTE — Telephone Encounter (Signed)
 Requested by interface surescripts. Future visit in 6 days.  Requested Prescriptions  Pending Prescriptions Disp Refills   amLODipine  (NORVASC ) 5 MG tablet [Pharmacy Med Name: AMLODIPINE  BESYLATE 5MG  TABLETS] 90 tablet 0    Sig: TAKE 1 TABLET(5 MG) BY MOUTH DAILY     Cardiovascular: Calcium  Channel Blockers 2 Passed - 08/11/2024 10:21 AM      Passed - Last BP in normal range    BP Readings from Last 1 Encounters:  07/28/24 100/62         Passed - Last Heart Rate in normal range    Pulse Readings from Last 1 Encounters:  07/28/24 72         Passed - Valid encounter within last 6 months    Recent Outpatient Visits           2 weeks ago Abdominal bloating   [place] [facility] [place], Jon HERO, MD   2 months ago Low ferritin   [place] [facility] [place], Jon HERO, MD   3 months ago Insomnia, unspecified type   [place] [facility] [place], Jon HERO, MD   5 months ago Diabetes mellitus with proteinuria [facility])   [place] [facility] [place], Jon HERO, MD       Future Appointments             In 6 days Bacigalupo, Jon HERO, MD [facility] [place], [place]             oxybutynin  (DITROPAN -XL) 10 MG 24 hr tablet [Pharmacy Med Name: OXYBUTYNIN  ER 10MG  TABLETS] 90 tablet 0    Sig: TAKE 1 TABLET(10 MG) BY MOUTH DAILY     Urology:  Bladder Agents Passed - 08/11/2024 10:21 AM      Passed - Valid encounter within last 12 months    Recent Outpatient Visits           2 weeks ago Abdominal bloating   [place] [facility] [place], Jon HERO, MD   2 months ago Low ferritin   [facility] [place], Jon HERO, MD   3 months ago Insomnia, unspecified type   [place] [facility], Jon HERO, MD   5 months ago Diabetes mellitus with proteinuria [facility])   [place] [facility]  Bacigalupo, Jon HERO, MD       Future Appointments             In 6 days Bacigalupo, Jon HERO, MD [facility], [place]             finasteride  (PROSCAR ) 5 MG tablet [Pharmacy Med Name: FINASTERIDE  5MG  TABLETS] 90 tablet 0    Sig: TAKE 1 TABLET(5 MG) BY MOUTH DAILY     Urology: 5-alpha Reductase Inhibitors Failed - 08/11/2024 10:21 AM      Failed - PSA in normal range and within 360 days    PSA  Date Value Ref Range Status  04/06/2018 0.6  Final   Prostate Specific Ag, Serum  Date Value Ref Range Status  08/14/2022 0.3 0.0 - 4.0 ng/mL Final    Comment:    Roche ECLIA methodology. According to the American Urological Association, Serum PSA should decrease and remain at undetectable levels after radical prostatectomy. The AUA defines biochemical recurrence as an initial PSA value 0.2 ng/mL or greater followed by a subsequent confirmatory PSA value 0.2 ng/mL or greater. Values obtained with different assay methods or  kits cannot be used interchangeably. Results cannot be interpreted as absolute evidence of the presence or absence of malignant disease.          Passed - Valid encounter within last 12 months    Recent Outpatient Visits           2 weeks ago Abdominal bloating   [place] [facility] [place], Jon HERO, MD   2 months ago Low ferritin   [place] [facility], Jon HERO, MD   3 months ago Insomnia, unspecified type   [place] [facility] [place], Jon HERO, MD   5 months ago Diabetes mellitus with proteinuria [facility])   [place] [facility] Bacigalupo, Jon HERO, MD       Future Appointments             In 6 days Bacigalupo, Jon HERO, MD [facility], [place]

## 2024-08-13 ENCOUNTER — Other Ambulatory Visit: Payer: Self-pay | Admitting: Family Medicine

## 2024-08-13 DIAGNOSIS — E1140 Type 2 diabetes mellitus with diabetic neuropathy, unspecified: Secondary | ICD-10-CM

## 2024-08-15 ENCOUNTER — Other Ambulatory Visit: Payer: Self-pay

## 2024-08-15 MED ORDER — OMEPRAZOLE 20 MG PO CPDR
20.0000 mg | DELAYED_RELEASE_CAPSULE | Freq: Every day | ORAL | 2 refills | Status: AC
Start: 2024-08-15 — End: ?

## 2024-08-15 NOTE — Telephone Encounter (Signed)
 Medication has been sent over to pharmacy.

## 2024-08-17 ENCOUNTER — Encounter: Admitting: Family Medicine

## 2024-08-22 ENCOUNTER — Ambulatory Visit: Admitting: Family Medicine

## 2024-08-22 ENCOUNTER — Encounter: Payer: Self-pay | Admitting: Family Medicine

## 2024-08-22 VITALS — BP 110/59 | HR 84 | Resp 16 | Ht 64.0 in | Wt 183.0 lb

## 2024-08-22 DIAGNOSIS — G4700 Insomnia, unspecified: Secondary | ICD-10-CM | POA: Diagnosis not present

## 2024-08-22 DIAGNOSIS — G2581 Restless legs syndrome: Secondary | ICD-10-CM

## 2024-08-22 DIAGNOSIS — E785 Hyperlipidemia, unspecified: Secondary | ICD-10-CM

## 2024-08-22 DIAGNOSIS — Z0001 Encounter for general adult medical examination with abnormal findings: Secondary | ICD-10-CM

## 2024-08-22 DIAGNOSIS — Z794 Long term (current) use of insulin: Secondary | ICD-10-CM

## 2024-08-22 DIAGNOSIS — E1140 Type 2 diabetes mellitus with diabetic neuropathy, unspecified: Secondary | ICD-10-CM

## 2024-08-22 DIAGNOSIS — E1159 Type 2 diabetes mellitus with other circulatory complications: Secondary | ICD-10-CM

## 2024-08-22 DIAGNOSIS — E1169 Type 2 diabetes mellitus with other specified complication: Secondary | ICD-10-CM

## 2024-08-22 DIAGNOSIS — I152 Hypertension secondary to endocrine disorders: Secondary | ICD-10-CM

## 2024-08-22 DIAGNOSIS — R790 Abnormal level of blood mineral: Secondary | ICD-10-CM

## 2024-08-22 DIAGNOSIS — Z0000 Encounter for general adult medical examination without abnormal findings: Secondary | ICD-10-CM

## 2024-08-22 NOTE — Assessment & Plan Note (Signed)
 Managed with Ambien , which is on the list of medications to avoid in adults over 65 due to potential side effects like drowsiness and increased fall risk. He reports no adverse effects and finds it effective. - Continue Ambien  with caution, monitor for side effects

## 2024-08-22 NOTE — Assessment & Plan Note (Signed)
 Managed with Farxiga , insulin , metformin , and Trulicity . Blood sugar levels are slightly elevated, with an estimated A1c over 7. He is considering transitioning diabetes management to this provider due to potential relocation and reduced need for specialist visits. - Ordered A1c test - Continue current diabetes medications - Will discuss potential transition of diabetes management to this provider if relocation occurs

## 2024-08-22 NOTE — Assessment & Plan Note (Signed)
 Improved with current treatment, including iron supplementation and medication. He reports fewer awakenings during the night, indicating better symptom control. - Continue current treatment for restless legs syndrome - Ordered iron panel to assess current iron levels

## 2024-08-22 NOTE — Assessment & Plan Note (Signed)
 Well controlled Continue current medications Recheck metabolic panel

## 2024-08-22 NOTE — Progress Notes (Signed)
 Annual Wellness Visit     Patient: Billy Wise, Male    DOB: 1946/04/22, 78 y.o.   MRN: [id number] Visit Date: 08/22/2024  Today's Provider: Jon Eva, MD   Chief Complaint  Patient presents with   Annual Exam    CPE/ Pt wants to discuss meds.   Subjective    Billy Wise is a 78 y.o. male who presents today for his Annual Wellness Visit.  Discussed the use of AI scribe software for clinical note transcription with the patient, who gave verbal consent to proceed.  History of Present Illness   Billy Wise is a 78 year old male who presents for a wellness physical visit and medication review.  He takes oxybutynin  for overactive bladder, Ambien  for sleep, and medication with iron supplements for restless leg syndrome, all with significant symptom improvement and no adverse effects. He uses fiber pills for digestive health and an over-the-counter Prilosec equivalent for acid reflux, both beneficial.  Diabetes is managed with Farxiga , insulin , metformin , and Trulicity , though dietary restrictions are challenging. He uses Kerendia and Farxiga  for kidney protection due to high protein levels.  He has no difficulties with hearing, vision, concentration, mobility, self-care, or performing errands alone. No falls in the past year. He received flu, COVID, and MMR booster vaccines on October 2nd without issues and is up to date with shingles and pneumonia vaccines. His last colonoscopy in 2019 was normal.  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             Medications: Outpatient Medications Prior to Visit  Medication Sig   Accu-Chek Softclix Lancets lancets Use to check blood sugar twice a day. DX E11.9   albuterol  (PROVENTIL ) (2.5 MG/3ML) 0.083% nebulizer solution USE 1 VIAL VIA NEBULIZER EVERY 6 HOURS AS NEEDED FOR WHEEZING OR SHORTNESS OF BREATH   albuterol  (VENTOLIN  HFA) 108 (90  Base) MCG/ACT inhaler INHALE 2 PUFFS BY MOUTH EVERY 6 HOURS AS NEEDED FOR WHEEZING AND SHORTNESS OF BREATH   amLODipine  (NORVASC ) 5 MG tablet TAKE 1 TABLET(5 MG) BY MOUTH DAILY   ascorbic acid (VITAMIN C) 1000 MG tablet Take 1 tablet by mouth daily.   aspirin  81 MG chewable tablet Chew 81 mg by mouth daily.   benzonatate  (TESSALON ) 100 MG capsule Take 1 capsule (100 mg total) by mouth 2 (two) times daily as needed for cough.   Blood Glucose Monitoring Suppl (ACCU-CHEK AVIVA PLUS) w/Device KIT Use to check blood sugar twice a day.  DX E11.9   BREZTRI  AEROSPHERE 160-9-4.8 MCG/ACT AERO INHALE 2 PUFFS INTO THE LUNGS IN THE MORNING AND AT BEDTIME   Continuous Glucose Sensor (FREESTYLE LIBRE 3 PLUS SENSOR) MISC by Does not apply route. Change sensor every 15 days.   dapagliflozin  propanediol (FARXIGA ) 10 MG TABS tablet Take 1 tablet (10 mg total) by mouth daily.   DROPLET PEN NEEDLES 32G X 4 MM MISC 4 (four) times daily.   Ensifentrine  (OHTUVAYRE ) 3 MG/2.5ML SUSP Inhale 2.5 mLs into the lungs in the morning and at bedtime.   finasteride  (PROSCAR ) 5 MG tablet TAKE 1 TABLET(5 MG) BY MOUTH DAILY   fluticasone  (FLONASE ) 50 MCG/ACT nasal spray SHAKE LIQUID AND USE 2 SPRAYS IN EACH NOSTRIL DAILY   gabapentin  (NEURONTIN ) 100 MG capsule TAKE 1 CAPSULE(100 MG) BY MOUTH THREE TIMES DAILY   gentamicin ointment (GARAMYCIN) 0.1 % Apply 1 Application topically 3 (three) times daily.  glucose blood (ACCU-CHEK GUIDE) test strip Use as instructed   hydrOXYzine  (ATARAX ) 25 MG tablet Take 1 tablet (25 mg total) by mouth 3 (three) times daily.   insulin  glargine (LANTUS  SOLOSTAR) 100 UNIT/ML Solostar Pen Inject 32 Units into the skin at bedtime.   insulin  lispro (HUMALOG) 100 UNIT/ML KwikPen Junior 12 units at breakfast, 10 units at lunch and 12 units at supper time   isosorbide  mononitrate (IMDUR ) 30 MG 24 hr tablet TAKE 1/2 TABLET(15 MG) BY MOUTH DAILY   KERENDIA 10 MG TABS Take by mouth.   Lactobacillus (PROBIOTIC  ACIDOPHILUS PO) Take 1 capsule by mouth daily.    metFORMIN  (GLUCOPHAGE ) 1000 MG tablet TAKE 1 TABLET(1000 MG) BY MOUTH TWICE DAILY   Nebulizers (COMPRESSOR/NEBULIZER) MISC Nebulizer Supplies   nitroGLYCERIN  (NITROSTAT ) 0.4 MG SL tablet Place 1 tablet (0.4 mg total) under the tongue every 5 (five) minutes as needed for chest pain.   omeprazole  (PRILOSEC) 20 MG capsule Take 1 capsule (20 mg total) by mouth daily.   oxybutynin  (DITROPAN -XL) 10 MG 24 hr tablet TAKE 1 TABLET(10 MG) BY MOUTH DAILY   ramipril  (ALTACE ) 5 MG capsule TAKE 2 CAPSULES BY MOUTH EVERY MORNING AND 1 CAPSULE EVERY NIGHT AT BEDTIME   rOPINIRole  (REQUIP ) 1 MG tablet Take 1 tablet (1 mg total) by mouth at bedtime.   rosuvastatin  (CRESTOR ) 10 MG tablet TAKE 1 TABLET(10 MG) BY MOUTH DAILY   Spacer/Aero-Holding Chambers (AEROCHAMBER PLS FLOVU MTHPIECE) DEVI Use as directed   tamsulosin  (FLOMAX ) 0.4 MG CAPS capsule TAKE 2 CAPSULES(0.8 MG) BY MOUTH DAILY   TRULICITY  4.5 MG/0.5ML SOAJ SMARTSIG:4.5 Milligram(s) SUB-Q Once a Week   zolpidem  (AMBIEN ) 10 MG tablet Take 1 tablet (10 mg total) by mouth at bedtime.   Facility-Administered Medications Prior to Visit  Medication Dose Route Frequency Provider   sodium chloride  flush (NS) 0.9 % injection 3 mL  3 mL Intravenous Q12H Gollan, Evalene PARAS, MD    No Known Allergies  Patient Care Team: Myrla Jon HERO, MD as PCP - General (Family Medicine) Rumalda Nancyann Bosworth, MD as Referring Physician (Endocrinology) Lucienne Tinnie KATHEE DELENA, MD as Referring Physician (Dermatology) Mittie Gaskin, MD as Referring Physician (Ophthalmology) Tamea Dedra CROME, MD as Consulting Physician (Pulmonary Disease)  Review of Systems       Objective    Vitals: BP (!) 110/59 (BP Location: Left Arm, Patient Position: Sitting)   Pulse 84   Resp 16   Ht 5' 4 (1.626 m)   Wt 183 lb (83 kg)   SpO2 91%   BMI 31.41 kg/m      Physical Exam Vitals reviewed.  Constitutional:      General:  He is not in acute distress.    Appearance: Normal appearance. He is well-developed. He is not diaphoretic.  HENT:     Head: Normocephalic and atraumatic.     Right Ear: Tympanic membrane, ear canal and external ear normal.     Left Ear: Tympanic membrane, ear canal and external ear normal.     Nose: Nose normal.     Mouth/Throat:     Mouth: Mucous membranes are moist.     Pharynx: Oropharynx is clear. No oropharyngeal exudate.  Eyes:     General: No scleral icterus.    Conjunctiva/sclera: Conjunctivae normal.     Pupils: Pupils are equal, round, and reactive to light.  Neck:     Thyroid : No thyromegaly.  Cardiovascular:     Rate and Rhythm: Normal rate and regular rhythm.  Heart sounds: Normal heart sounds. No murmur heard. Pulmonary:     Effort: Pulmonary effort is normal. No respiratory distress.     Breath sounds: Normal breath sounds. No wheezing or rales.  Abdominal:     General: There is no distension.     Palpations: Abdomen is soft.     Tenderness: There is no abdominal tenderness.  Musculoskeletal:        General: No deformity.     Cervical back: Neck supple.     Right lower leg: No edema.     Left lower leg: No edema.  Lymphadenopathy:     Cervical: No cervical adenopathy.  Skin:    General: Skin is warm and dry.     Findings: No rash.  Neurological:     Mental Status: He is alert and oriented to person, place, and time. Mental status is at baseline.     Gait: Gait normal.  Psychiatric:        Mood and Affect: Mood normal.        Behavior: Behavior normal.        Thought Content: Thought content normal.     Most recent functional status assessment:    08/22/2024    8:59 AM  In your present state of health, do you have any difficulty performing the following activities:  Hearing? 0  Vision? 0  Difficulty concentrating or making decisions? 0  Walking or climbing stairs? 0  Dressing or bathing? 0  Doing errands, shopping? 0   Most recent fall risk  assessment:    08/22/2024    8:59 AM  Fall Risk   Falls in the past year? 0  Number falls in past yr: 0  Injury with Fall? 0    Most recent depression screenings:    08/22/2024    8:59 AM 05/23/2024    8:22 AM  PHQ 2/9 Scores  PHQ - 2 Score 0 0  PHQ- 9 Score 0 4   Most recent cognitive screening:    08/22/2024    8:18 AM  6CIT Screen  What Year? 0 points  What month? 0 points  What time? 0 points  Count back from 20 0 points  Months in reverse 0 points  Repeat phrase 0 points  Total Score 0 points   Most recent Audit-C alcohol use screening    08/13/2024   10:09 AM  Alcohol Use Disorder Test (AUDIT)  1. How often do you have a drink containing alcohol? 1  2. How many drinks containing alcohol do you have on a typical day when you are drinking? 0  3. How often do you have six or more drinks on one occasion? 0  AUDIT-C Score 1      Patient-reported   A score of 3 or more in women, and 4 or more in men indicates increased risk for alcohol abuse, EXCEPT if all of the points are from question 1   No results found.  No results found for any visits on 08/22/24.  Assessment & Plan     Annual wellness visit done today including the all of the following: Reviewed patient's Family Medical History Reviewed and updated list of patient's medical providers Assessment of cognitive impairment was done Assessed patient's functional ability Established a written schedule for health screening services Health Risk Assessent Completed and Reviewed  Exercise Activities and Dietary recommendations  Goals      DIET - REDUCE PORTION SIZE     Recommend to decrease portion sizes  by eating 3 small healthy meals and at least 2 healthy snacks per day.        Immunization History  Administered Date(s) Administered    sv, Bivalent, Protein Subunit Rsvpref,pf (Abrysvo) 09/05/2022   Fluad Quad(high Dose 65+) 08/05/2020, 08/02/2022, 08/02/2022   INFLUENZA, HIGH DOSE SEASONAL PF  07/24/2015, 07/21/2016, 08/02/2017, 08/06/2017, 07/27/2018   Influenza,inj,Quad PF,6+ Mos 06/27/2019   Influenza-Unspecified 09/14/2012, 07/19/2021, 08/08/2023, 07/20/2024   Moderna SARS-COV2 Booster Vaccination 07/06/2021   PFIZER(Purple Top)SARS-COV-2 Vaccination 11/10/2019, 12/01/2019, 07/14/2020   PNEUMOCOCCAL CONJUGATE-20 01/12/2022   Pfizer Covid-19 Vaccine Bivalent Booster 68yrs & up 01/26/2021   Pfizer(Comirnaty)Fall Seasonal Vaccine 12 years and older 08/02/2022   Pneumococcal Conjugate-13 08/25/2014   Pneumococcal Polysaccharide-23 01/26/2016   Pneumococcal-Unspecified 12/17/2005   Tdap 12/17/2005, 02/25/2016   Zoster Recombinant(Shingrix) 08/23/2021, 06/05/2022   Zoster, Live 11/24/2013    Health Maintenance  Topic Date Due   COVID-19 Vaccine (6 - 2025-26 season) 06/19/2024   HEMOGLOBIN A1C  08/16/2024   Diabetic kidney evaluation - Urine ACR  09/21/2024   Diabetic kidney evaluation - eGFR measurement  02/14/2025   FOOT EXAM  02/14/2025   OPHTHALMOLOGY EXAM  02/14/2025   Medicare Annual Wellness (AWV)  08/22/2025   DTaP/Tdap/Td (3 - Td or Tdap) 02/24/2026   Colonoscopy  10/18/2028   Pneumococcal Vaccine: 50+ Years  Completed   Influenza Vaccine  Completed   Hepatitis C Screening  Completed   Zoster Vaccines- Shingrix  Completed   Meningococcal B Vaccine  Aged Out   Lung Cancer Screening  Discontinued     Discussed health benefits of physical activity, and encouraged him to engage in regular exercise appropriate for his age and condition.    Problem List Items Addressed This Visit       Cardiovascular and Mediastinum   Hypertension associated with diabetes (HCC)   Well controlled Continue current medications Recheck metabolic panel      Relevant Orders   Comprehensive metabolic panel with GFR     Endocrine   Hyperlipidemia associated with type 2 diabetes mellitus (HCC)   Relevant Orders   Comprehensive metabolic panel with GFR   Lipid panel   Type 2  diabetes mellitus with diabetic neuropathy (HCC)   Managed with Farxiga , insulin , metformin , and Trulicity . Blood sugar levels are slightly elevated, with an estimated A1c over 7. He is considering transitioning diabetes management to this provider due to potential relocation and reduced need for specialist visits. - Ordered A1c test - Continue current diabetes medications - Will discuss potential transition of diabetes management to this provider if relocation occurs      Relevant Orders   Hemoglobin A1c   Microalbumin / creatinine urine ratio     Other   Insomnia   Managed with Ambien , which is on the list of medications to avoid in adults over 65 due to potential side effects like drowsiness and increased fall risk. He reports no adverse effects and finds it effective. - Continue Ambien  with caution, monitor for side effects      Restless leg syndrome   Improved with current treatment, including iron supplementation and medication. He reports fewer awakenings during the night, indicating better symptom control. - Continue current treatment for restless legs syndrome - Ordered iron panel to assess current iron levels      Relevant Orders   CBC w/Diff/Platelet   Iron, TIBC and Ferritin Panel   Other Visit Diagnoses       Encounter for annual wellness visit (AWV) in Medicare patient    -  Primary     Encounter for annual physical exam         Low ferritin       Relevant Orders   CBC w/Diff/Platelet   Iron, TIBC and Ferritin Panel           Adult Wellness Visit Routine wellness visit with no issues in functional status, hearing, vision, memory, or mobility. No falls reported in the last year. Vaccinations are up to date, including COVID, flu, MMR, shingles, pneumonia, and RSV. Colonoscopy in 2019 was normal, and no further screening is needed due to age. - Continue annual wellness visits - Ensure vaccinations are up to date - No further colonoscopy needed due to  age  Overactive bladder Managed with oxybutynin , which is effective but on the list of medications to avoid in adults over 65 due to potential side effects. He reports no adverse effects and finds it effective. Alternative medications like Myrbetriq were discussed but may have insurance coverage issues. - Continue oxybutynin  with caution, monitor for side effects  Gastroesophageal reflux disease Managed with over-the-counter Prilosec, which he reports is effective in controlling symptoms. - Continue Prilosec as needed       Return in about 6 months (around 02/19/2025) for chronic disease f/u.     Jon Eva, MD  [facility] [phone] (phone) [phone] (fax)  [facility]

## 2024-08-23 ENCOUNTER — Ambulatory Visit: Payer: Self-pay | Admitting: Family Medicine

## 2024-08-23 DIAGNOSIS — E1121 Type 2 diabetes mellitus with diabetic nephropathy: Secondary | ICD-10-CM | POA: Diagnosis not present

## 2024-08-23 DIAGNOSIS — Z794 Long term (current) use of insulin: Secondary | ICD-10-CM | POA: Diagnosis not present

## 2024-08-24 LAB — CBC WITH DIFFERENTIAL/PLATELET
Basophils Absolute: 0 x10E3/uL (ref 0.0–0.2)
Basos: 1 %
EOS (ABSOLUTE): 0.2 x10E3/uL (ref 0.0–0.4)
Eos: 4 %
Hematocrit: 48.8 % (ref 37.5–51.0)
Hemoglobin: 16 g/dL (ref 13.0–17.7)
Immature Grans (Abs): 0 x10E3/uL (ref 0.0–0.1)
Immature Granulocytes: 0 %
Lymphocytes Absolute: 1.3 x10E3/uL (ref 0.7–3.1)
Lymphs: 20 %
MCH: 27.8 pg (ref 26.6–33.0)
MCHC: 32.8 g/dL (ref 31.5–35.7)
MCV: 85 fL (ref 79–97)
Monocytes Absolute: 0.6 x10E3/uL (ref 0.1–0.9)
Monocytes: 8 %
Neutrophils Absolute: 4.4 x10E3/uL (ref 1.4–7.0)
Neutrophils: 66 %
Platelets: 176 x10E3/uL (ref 150–450)
RBC: 5.75 x10E6/uL (ref 4.14–5.80)
RDW: 15.4 % (ref 11.6–15.4)
WBC: 6.6 x10E3/uL (ref 3.4–10.8)

## 2024-08-24 LAB — COMPREHENSIVE METABOLIC PANEL WITH GFR
ALT: 18 IU/L (ref 0–44)
AST: 21 IU/L (ref 0–40)
Albumin: 4.3 g/dL (ref 3.8–4.8)
Alkaline Phosphatase: 67 IU/L (ref 47–123)
BUN/Creatinine Ratio: 17 (ref 10–24)
BUN: 18 mg/dL (ref 8–27)
Bilirubin Total: 0.4 mg/dL (ref 0.0–1.2)
CO2: 24 mmol/L (ref 20–29)
Calcium: 8.8 mg/dL (ref 8.6–10.2)
Chloride: 98 mmol/L (ref 96–106)
Creatinine, Ser: 1.04 mg/dL (ref 0.76–1.27)
Globulin, Total: 3.1 g/dL (ref 1.5–4.5)
Glucose: 139 mg/dL — ABNORMAL HIGH (ref 70–99)
Potassium: 4.7 mmol/L (ref 3.5–5.2)
Sodium: 138 mmol/L (ref 134–144)
Total Protein: 7.4 g/dL (ref 6.0–8.5)
eGFR: 75 mL/min/1.73 (ref >59)

## 2024-08-24 LAB — LIPID PANEL
Chol/HDL Ratio: 4.2 ratio (ref 0.0–5.0)
Cholesterol, Total: 105 mg/dL (ref 100–199)
HDL: 25 mg/dL — ABNORMAL LOW (ref >39)
LDL Chol Calc (NIH): 39 mg/dL (ref 0–99)
Triglycerides: 269 mg/dL — ABNORMAL HIGH (ref 0–149)
VLDL Cholesterol Cal: 41 mg/dL — ABNORMAL HIGH (ref 5–40)

## 2024-08-24 LAB — IRON,TIBC AND FERRITIN PANEL
Ferritin: 60 ng/mL (ref 30–400)
Iron Saturation: 17 % (ref 15–55)
Iron: 55 ug/dL (ref 38–169)
Total Iron Binding Capacity: 319 ug/dL (ref 250–450)
UIBC: 264 ug/dL (ref 111–343)

## 2024-08-24 LAB — HEMOGLOBIN A1C
Est. average glucose Bld gHb Est-mCnc: 160 mg/dL
Hgb A1c MFr Bld: 7.2 % — ABNORMAL HIGH (ref 4.8–5.6)

## 2024-08-24 LAB — MICROALBUMIN / CREATININE URINE RATIO
Creatinine, Urine: 65.6 mg/dL
Microalb/Creat Ratio: 684 mg/g{creat} — AB (ref 0–29)
Microalbumin, Urine: 448.7 ug/mL

## 2024-09-01 ENCOUNTER — Other Ambulatory Visit: Payer: Self-pay | Admitting: Family Medicine

## 2024-09-01 NOTE — Telephone Encounter (Signed)
 [facility] faxed refill request for the following medications:   isosorbide  mononitrate (IMDUR ) 30 MG 24 hr tablet     Please advise.

## 2024-09-03 DIAGNOSIS — E1121 Type 2 diabetes mellitus with diabetic nephropathy: Secondary | ICD-10-CM | POA: Diagnosis not present

## 2024-09-04 NOTE — Telephone Encounter (Signed)
 Hey Dr End. Not sure who to send this to at your office, but we got a refill request for one of your meds on this mutual patient. Thanks!

## 2024-09-06 ENCOUNTER — Telehealth: Payer: Self-pay | Admitting: Pulmonary Disease

## 2024-09-06 NOTE — Telephone Encounter (Signed)
 Copied from CRM [id number]. Topic: Clinical - Order For Equipment >> Sep 06, 2024 10:35 AM Lavanda D wrote: Reason for CRM: Diane from [employer] is calling, she would like to follow-up on a faxed request for oxygen  equipment. Verified correct fax # with her. She stated that she will refax today.

## 2024-09-07 ENCOUNTER — Other Ambulatory Visit: Payer: Self-pay | Admitting: Family Medicine

## 2024-09-07 NOTE — Telephone Encounter (Signed)
 Form was signed and faxed on 09/05/2024. NFN.

## 2024-09-07 NOTE — Telephone Encounter (Signed)
 Received standard written order Oxygen . In Dr. Evalyn folder.

## 2024-09-07 NOTE — Telephone Encounter (Signed)
 Copied from CRM #[id number]. Topic: Clinical - Medication Refill >> Sep 07, 2024  9:49 AM Amy B wrote: Medication:  isosorbide  mononitrate (IMDUR ) 30 MG 24 hr tablet  Has the patient contacted their pharmacy? Yes (Agent: If no, request that the patient contact the pharmacy for the refill. If patient does not wish to contact the pharmacy document the reason why and proceed with request.) (Agent: If yes, when and what did the pharmacy advise?)  This is the patient's preferred pharmacy:  [id number] - [place], [place] - [address] AT [facility] OF [address] [facility] [address] [place] [address] Phone: [phone] Fax: [phone]  Is this the correct pharmacy for this prescription? Yes If no, delete pharmacy and type the correct one.   Has the prescription been filled recently? No  Is the patient out of the medication? Yes  Has the patient been seen for an appointment in the last year OR does the patient have an upcoming appointment? Yes  Can we respond through MyChart? Yes  Agent: Please be advised that Rx refills may take up to 3 business days. We ask that you follow-up with your pharmacy.

## 2024-09-08 MED ORDER — ISOSORBIDE MONONITRATE ER 30 MG PO TB24: 15.0000 mg | ORAL_TABLET | Freq: Every day | ORAL | 0 refills | Status: AC

## 2024-09-08 NOTE — Telephone Encounter (Signed)
 Requested Prescriptions  Refused Prescriptions Disp Refills   isosorbide  mononitrate (IMDUR ) 30 MG 24 hr tablet 45 tablet 1     Cardiovascular:  Nitrates Passed - 09/08/2024  5:44 PM      Passed - Last BP in normal range    BP Readings from Last 1 Encounters:  08/22/24 (!) 110/59         Passed - Last Heart Rate in normal range    Pulse Readings from Last 1 Encounters:  08/22/24 84         Passed - Valid encounter within last 12 months    Recent Outpatient Visits           2 weeks ago Encounter for annual wellness visit (AWV) in Medicare patient   [place] [facility], Jon HERO, MD   1 month ago Abdominal bloating   [place] [facility], Jon HERO, MD   3 months ago Low ferritin   [place] [facility] [place], Jon HERO, MD   4 months ago Insomnia, unspecified type   [facility] [place], Jon HERO, MD   6 months ago Diabetes mellitus with proteinuria [facility])   [place] [facility], Jon HERO, MD

## 2024-09-15 ENCOUNTER — Other Ambulatory Visit: Payer: Self-pay | Admitting: Family Medicine

## 2024-09-15 DIAGNOSIS — I152 Hypertension secondary to endocrine disorders: Secondary | ICD-10-CM

## 2024-09-19 ENCOUNTER — Other Ambulatory Visit: Payer: Self-pay

## 2024-09-19 DIAGNOSIS — E1159 Type 2 diabetes mellitus with other circulatory complications: Secondary | ICD-10-CM

## 2024-09-19 MED ORDER — RAMIPRIL 5 MG PO CAPS: 5.0000 mg | ORAL_CAPSULE | Freq: Every day | ORAL | 0 refills | Status: DC

## 2024-09-21 ENCOUNTER — Telehealth: Payer: Self-pay | Admitting: Family Medicine

## 2024-09-21 NOTE — Telephone Encounter (Signed)
 [facility] needs specific directions on how the patient is to take Altace  5 mg.    The directions they have are unclear.

## 2024-09-23 ENCOUNTER — Encounter: Payer: Self-pay | Admitting: Pulmonary Disease

## 2024-09-25 ENCOUNTER — Other Ambulatory Visit: Payer: Self-pay | Admitting: Family Medicine

## 2024-09-25 ENCOUNTER — Other Ambulatory Visit: Payer: Self-pay

## 2024-09-25 DIAGNOSIS — I152 Hypertension secondary to endocrine disorders: Secondary | ICD-10-CM

## 2024-09-25 MED ORDER — RAMIPRIL 5 MG PO CAPS: 5.0000 mg | ORAL_CAPSULE | Freq: Every day | ORAL | 1 refills | Status: DC

## 2024-09-25 NOTE — Telephone Encounter (Signed)
 I think that pharmacy note is an error. Please resend Ramipril  Rx with 1 tab daily #90 r1 and let pharmacy know that 1 tab daily is the correct dose

## 2024-09-25 NOTE — Telephone Encounter (Signed)
 Resent RX to pharmacy stating take 1 capsule daily.

## 2024-09-25 NOTE — Telephone Encounter (Signed)
 Previously sent to wrong pharmacy, resend to  [id number] - [place], [place] - [address] AT [facility] [address]

## 2024-09-25 NOTE — Telephone Encounter (Signed)
 Copied from CRM #[id number]. Topic: Clinical - Medication Refill >> Sep 25, 2024 10:04 AM Delon HERO wrote: Medication: ramipril  (ALTACE ) 5 MG capsule [[id number]]  Medication was sent to the wrong pharmacy. Please send to the pharmacy below.  Has the patient contacted their pharmacy? Yes (Agent: If no, request that the patient contact the pharmacy for the refill. If patient does not wish to contact the pharmacy document the reason why and proceed with request.) (Agent: If yes, when and what did the pharmacy advise?)  This is the patient's preferred pharmacy:  [id number] - [place], [place] - [address] AT [facility] [address] [place] [address] Phone: [phone] Fax: [phone]   Is this the correct pharmacy for this prescription? Yes If no, delete pharmacy and type the correct one.   Has the prescription been filled recently? Yes  Is the patient out of the medication? Yes  Has the patient been seen for an appointment in the last year OR does the patient have an upcoming appointment? Yes  Can we respond through MyChart? Yes  Agent: Please be advised that Rx refills may take up to 3 business days. We ask that you follow-up with your pharmacy.

## 2024-09-26 DIAGNOSIS — Z794 Long term (current) use of insulin: Secondary | ICD-10-CM | POA: Diagnosis not present

## 2024-09-26 DIAGNOSIS — E1121 Type 2 diabetes mellitus with diabetic nephropathy: Secondary | ICD-10-CM | POA: Diagnosis not present

## 2024-09-27 ENCOUNTER — Other Ambulatory Visit: Payer: Self-pay | Admitting: Family Medicine

## 2024-09-27 DIAGNOSIS — I152 Hypertension secondary to endocrine disorders: Secondary | ICD-10-CM

## 2024-09-27 MED ORDER — RAMIPRIL 5 MG PO CAPS: 5.0000 mg | ORAL_CAPSULE | Freq: Every day | ORAL | 1 refills | Status: DC

## 2024-09-27 NOTE — Progress Notes (Signed)
 Cardiology Office Note   Date:  09/28/2024  ID:  Billy Wise, Billy Wise 12/23/1945, MRN [id number] PCP: Myrla Jon HERO, MD  [facility] Providers Cardiologist:  None     History of Present Illness Billy Wise is a 78 y.o. male with a past medical history of moderate COPD, former smoker, nonobstructive coronary artery disease, type 2 diabetes, hypertension, hyperlipidemia, who is here today for follow-up.   He was previously been followed by Dr. Gollan and had chest pain for over 6 months when he was seen in clinic in March 2024.  With continued symptoms concerning for unstable angina and history of three-vessel disease on CT scan he was scheduled for right and left heart catheterization with Dr. Mady on 12/29/2022.  Cath revealed moderate multivessel disease involving the mid LAD and he was started on Imdur  rosuvastatin  was increased for secondary prevention.   He was seen in clinic in April 2024 where he had overall been doing well from a cardiac perspective.  He denied any further chest pain.  He continues to have shortness of breath and cough related to his COPD.  There were no medication changes that were made or further testing that was ordered.  He was last seen in clinic 04/30/2023 by Dr. Gollan.  At that time he was doing well.  No chest pain and chronic mild shortness of breath.  He was continued on his current medication regimen without any further testing that was ordered.    He returns to clinic today stating that he has been doing well from a cardiac perspective.  He denies any chest pain and continues to have chronic mild shortness of breath on exertion.  He states that is unchanged.  States that he is breathing has improved since he has been on a different pulmonary medication.  States that he just recently had PFTs completed and his lung function had improved by 7%.  He also noted that he had protein noted in his urine and was placed on a different medication and  has follow-up regarding his kidneys.  States that he has been compliant with his current medication regimen without any undue side effects.  Denies any hospitalizations or visits to the emergency department.  ROS: 10 point review of system has been reviewed and considered negative with exception was been listed in the HPI  Studies Reviewed EKG Interpretation Date/Time:  Thursday September 28 2024 08:12:04 EST Ventricular Rate:  96 PR Interval:  132 QRS Duration:  92 QT Interval:  356 QTC Calculation: 449 R Axis:   -59  Text Interpretation: Sinus rhythm with occasional Premature ventricular complexes Left anterior fascicular block When compared with ECG of 29-Dec-2022 11:07, PREVIOUS ECG IS PRESENT Confirmed by Gerard Frederick ([id number]) on 09/28/2024 8:14:16 AM    R/LHC 12/29/22 Conclusions: Moderate multivessel coronary artery disease involving mid LAD and dominant LCx.  Most significant disease of up to 50-60% involving the mid LAD is borderline significant by RFR (0.90).  OM3 disease is not hemodynamically significant (RFR = 0.99). Upper normal to mildly elevated left heart filling pressures (PCWP 19 mmHg, LVEDP 15 mmHg). Mildly to moderately elevated right heart and pulmonary artery pressures (mean PA 12 mmHg, RVEDP 10 mmHg, mean PAP 31 mmHg). Normal Fick cardiac output/index.   Recommendations: Add isosorbide  mononitrate for antianginal therapy.  If angina persists despite optimization of antianginal therapy, PCI to the mid LAD could be considered. Increase rosuvastatin  to 10 mg daily for more aggressive secondary prevention.  Lipid panel and  ALT should be repeated in ~3 months to assess response. Continue management of COPD, which is most likely cause of pulmonary hypertension.     LHC from outside facility Coronary Angiography March 2018 1. Left Main - Normal 2. Left anterior descending artery - 10% luminal irregularities in the proximal and mid vessel. 3. Diagonals - Normal 4.  Left Circumflex - large, dominant vessel that terminates in the PDA, 10% luminal irregularities. 5. Obtuse Marginals - Normal 6. Right Coronary Artery - nondominant, normal. 7. Posterior Descending Artery - from the circumflex, 10% luminal irregularities.    Risk Assessment/Calculations           Physical Exam VS:  BP 98/60 (BP Location: Left Arm, Patient Position: Sitting, Cuff Size: Normal)   Pulse 96 Comment: 97 oximeter  Ht 5' 4 (1.626 m)   Wt 182 lb 3.2 oz (82.6 kg)   SpO2 90%   BMI 31.27 kg/m        Wt Readings from Last 3 Encounters:  09/28/24 182 lb 3.2 oz (82.6 kg)  08/22/24 183 lb (83 kg)  07/28/24 180 lb 12.8 oz (82 kg)    GEN: Well nourished, well developed in no acute distress NECK: No JVD; No carotid bruits CARDIAC: RRR, no murmurs, rubs, gallops RESPIRATORY:  Clear to auscultation without rales, wheezing or rhonchi  ABDOMEN: Soft, non-tender, non-distended EXTREMITIES:  No edema; No deformity   ASSESSMENT AND PLAN Coronary artery disease native coronary arteries with stable angina.  Prior right and left heart catheterization revealed moderate multivessel coronary artery disease involving the mid LAD and dominant left circumflex.  He denies any recurrent chest discomfort.  He has been continued on Imdur  15 mg daily, aspirin  81 mg daily, and rosuvastatin  10 mg daily.  EKG today reveals sinus rhythm with a rate of 96 with occasional PVC with a left anterior fascicular block.  No acute ischemic changes noted.  No further ischemic evaluation needed at this time.  Primary hypertension associated with type 2 diabetes with a blood pressure today of 98/60.  He has continued on ramipril  15 mg daily, Imdur  50 mg daily and amlodipine  5 mg daily.  Consider adding beta-blocker therapy but after further discussion is deferred at this time we will revisit on follow-up.  He has been encouraged to continue to monitor his pressure 1 to 2 hours postmedication administration at home as  well.  Mixed hyperlipidemia associated with type 2 diabetes with an LDL of 39.  He is continued on rosuvastatin  10 mg daily.  LDL remains at goal of less than 55.  COPD with chronic oxygen  at night he reports symptoms continue to be mild but have improved since new nebulizer medication.  PFT is also improved.  Ongoing management per pulmonary.  Type 2 diabetes with a hemoglobin A1c of 7.2.  He has been continued on Farxiga , Trulicity , Lantus , Humalog, metformin .  Ongoing management per his PCP.       Dispo: Patient to return to clinic to see MD/APP in 6 months or sooner if needed for further evaluation  Signed, Nadja Lina, NP

## 2024-09-27 NOTE — Telephone Encounter (Signed)
 Requested Prescriptions  Pending Prescriptions Disp Refills   ramipril  (ALTACE ) 5 MG capsule 90 capsule 1    Sig: Take 1 capsule (5 mg total) by mouth daily.     Cardiovascular:  ACE Inhibitors Passed - 09/27/2024  9:05 AM      Passed - Cr in normal range and within 180 days    Creatinine  Date Value Ref Range Status  03/29/2014 2.20 (H) 0.60 - 1.30 mg/dL Final   Creatinine, Ser  Date Value Ref Range Status  08/22/2024 1.04 0.76 - 1.27 mg/dL Final   Creatinine, Urine  Date Value Ref Range Status  10/22/2022 88.4  Final         Passed - K in normal range and within 180 days    Potassium  Date Value Ref Range Status  08/22/2024 4.7 3.5 - 5.2 mmol/L Final  03/29/2014 3.5 3.5 - 5.1 mmol/L Final         Passed - Patient is not pregnant      Passed - Last BP in normal range    BP Readings from Last 1 Encounters:  08/22/24 (!) 110/59         Passed - Valid encounter within last 6 months    Recent Outpatient Visits           1 month ago Encounter for annual wellness visit (AWV) in Medicare patient   [place] [facility] [place], Jon HERO, MD   2 months ago Abdominal bloating   [place] [facility] [place], Jon HERO, MD   4 months ago Low ferritin   [place] [facility] [place], Jon HERO, MD   5 months ago Insomnia, unspecified type   [place] [facility] [place], Jon HERO, MD   7 months ago Diabetes mellitus with proteinuria [facility])   [place] [facility] [place], Jon HERO, MD       Future Appointments             Tomorrow Gerard Frederick, NP  [facility] at [facility]

## 2024-09-28 ENCOUNTER — Ambulatory Visit: Attending: Cardiology | Admitting: Cardiology

## 2024-09-28 ENCOUNTER — Encounter: Payer: Self-pay | Admitting: Cardiology

## 2024-09-28 VITALS — BP 98/60 | HR 96 | Ht 64.0 in | Wt 182.2 lb

## 2024-09-28 DIAGNOSIS — J441 Chronic obstructive pulmonary disease with (acute) exacerbation: Secondary | ICD-10-CM | POA: Diagnosis not present

## 2024-09-28 DIAGNOSIS — I152 Hypertension secondary to endocrine disorders: Secondary | ICD-10-CM | POA: Diagnosis not present

## 2024-09-28 DIAGNOSIS — E1169 Type 2 diabetes mellitus with other specified complication: Secondary | ICD-10-CM

## 2024-09-28 DIAGNOSIS — E1159 Type 2 diabetes mellitus with other circulatory complications: Secondary | ICD-10-CM | POA: Diagnosis not present

## 2024-09-28 DIAGNOSIS — I25118 Atherosclerotic heart disease of native coronary artery with other forms of angina pectoris: Secondary | ICD-10-CM | POA: Diagnosis not present

## 2024-09-28 DIAGNOSIS — E785 Hyperlipidemia, unspecified: Secondary | ICD-10-CM | POA: Diagnosis not present

## 2024-09-28 NOTE — Patient Instructions (Signed)
 Medication Instructions:  Your physician recommends that you continue on your current medications as directed. Please refer to the Current Medication list given to you today.   *If you need a refill on your cardiac medications before your next appointment, please call your pharmacy*  Lab Work: No labs ordered today  If you have labs (blood work) drawn today and your tests are completely normal, you will receive your results only by: MyChart Message (if you have MyChart) OR A paper copy in the mail If you have any lab test that is abnormal or we need to change your treatment, we will call you to review the results.  Testing/Procedures: No test ordered today   Follow-Up: At [facility], you and your health needs are our priority.  As part of our continuing mission to provide you with exceptional heart care, our providers are all part of one team.  This team includes your primary Cardiologist (physician) and Advanced Practice Providers or APPs (Physician Assistants and Nurse Practitioners) who all work together to provide you with the care you need, when you need it.  Your next appointment:   6 month(s)  Provider:   Ronald Cockayne, NP

## 2024-10-09 ENCOUNTER — Ambulatory Visit (INDEPENDENT_AMBULATORY_CARE_PROVIDER_SITE_OTHER)

## 2024-10-09 VITALS — Ht 64.0 in | Wt 182.0 lb

## 2024-10-09 DIAGNOSIS — Z0000 Encounter for general adult medical examination without abnormal findings: Secondary | ICD-10-CM

## 2024-10-09 NOTE — Progress Notes (Signed)
 "   Chief Complaint  Patient presents with   Medicare Wellness                                                                                                                                               Subjective:   Billy Wise is a 78 y.o. male who presents for a Medicare Annual Wellness Visit.  Visit info / Clinical Intake: Medicare Wellness Visit Type:: Subsequent Annual Wellness Visit Persons participating in visit and providing information:: patient Medicare Wellness Visit Mode:: Telephone If telephone:: video declined Since this visit was completed virtually, some vitals may be partially provided or unavailable. Missing vitals are due to the limitations of the virtual format.: Unable to obtain vitals - no equipment If Telephone or Video please confirm:: I connected with patient using audio/video enable telemedicine. I verified patient identity with two identifiers, discussed telehealth limitations, and patient agreed to proceed. Patient Location:: Work Dispensing Optician:: Home Interpreter Needed?: No Pre-visit prep was completed: yes AWV questionnaire completed by patient prior to visit?: yes Date:: 10/06/24 Living arrangements:: (Patient-Rptd) lives with spouse/significant other Patient's Overall Health Status Rating: (Patient-Rptd) good Typical amount of pain: (Patient-Rptd) none Does pain affect daily life?: (Patient-Rptd) no Are you currently prescribed opioids?: no  Dietary Habits and Nutritional Risks How many meals a day?: (Patient-Rptd) 3 Eats fruit and vegetables daily?: (Patient-Rptd) yes Most meals are obtained by: (Patient-Rptd) eating out In the last 2 weeks, have you had any of the following?: (!) nausea, vomiting, diarrhea; none Diabetic:: (!) yes Any non-healing wounds?: no How often do you check your BS?: continuous glucose monitor Would you like to be referred to a Nutritionist or for Diabetic Management? : no  Functional Status Activities of  Daily Living (to include ambulation/medication): (Patient-Rptd) Independent Ambulation: Independent with device- listed below Home Assistive Devices/Equipment: Eyeglasses Medication Administration: (Patient-Rptd) Independent Home Management (perform basic housework or laundry): (Patient-Rptd) Independent Manage your own finances?: (Patient-Rptd) yes Primary transportation is: (Patient-Rptd) driving Concerns about vision?: no *vision screening is required for WTM* Concerns about hearing?: no  Fall Screening Falls in the past year?: (Patient-Rptd) 0 Number of falls in past year: 0 Was there an injury with Fall?: 0 Fall Risk Category Calculator: 0 Patient Fall Risk Level: Low Fall Risk  Fall Risk Patient at Risk for Falls Due to: No Fall Risks Fall risk Follow up: Falls evaluation completed; Falls prevention discussed  Home and Transportation Safety: All rugs have non-skid backing?: (Patient-Rptd) N/A, no rugs All stairs or steps have railings?: (Patient-Rptd) yes Grab bars in the bathtub or shower?: (Patient-Rptd) yes Have non-skid surface in bathtub or shower?: (Patient-Rptd) yes Good home lighting?: (Patient-Rptd) yes Regular seat belt use?: (Patient-Rptd) yes Hospital stays in the last year:: (Patient-Rptd) no  Cognitive Assessment Difficulty concentrating, remembering, or making decisions? : (Patient-Rptd) no Will 6CIT or Mini Cog be Completed: no  Was the patient able to repeat memory words in 3 tries?: yes Which version was used?: Version 3 : village, kitchen, baby How many words correct?: 3 Which version was used?: Version 3: village kitchen baby What year is it?: 0 points What month is it?: 0 points About what time is it?: 0 points Count backwards from 20 to 1: 0 points Say the months of the year in reverse: 0 points Repeat the address phrase from earlier: 0 points 6 CIT Score: 0 points  Advance Directives (For Healthcare) Does Patient Have a Medical Advance  Directive?: Yes Does patient want to make changes to medical advance directive?: No - Patient declined Type of Advance Directive: Healthcare Power of [place]; Living will; Out of facility DNR (pink MOST or yellow form) Copy of Healthcare Power of Attorney in Chart?: No - copy requested Healthcare Power of Attorney Requested and Now in Chart: Copy in chart Copy of Living Will in Chart?: No - copy requested Living Will Requested and Now in Chart: Copy in chart Out of facility DNR (pink MOST or yellow form) in Chart? (Ambulatory ONLY): No - copy requested  Reviewed/Updated  Reviewed/Updated: Reviewed All (Medical, Surgical, Family, Medications, Allergies, Care Teams, Patient Goals)    Allergies (verified) Patient has no known allergies.   Current Medications (verified) Outpatient Encounter Medications as of 10/09/2024  Medication Sig   Accu-Chek Softclix Lancets lancets Use to check blood sugar twice a day. DX E11.9   albuterol  (PROVENTIL ) (2.5 MG/3ML) 0.083% nebulizer solution USE 1 VIAL VIA NEBULIZER EVERY 6 HOURS AS NEEDED FOR WHEEZING OR SHORTNESS OF BREATH   albuterol  (VENTOLIN  HFA) 108 (90 Base) MCG/ACT inhaler INHALE 2 PUFFS BY MOUTH EVERY 6 HOURS AS NEEDED FOR WHEEZING AND SHORTNESS OF BREATH   amLODipine  (NORVASC ) 5 MG tablet TAKE 1 TABLET(5 MG) BY MOUTH DAILY   ascorbic acid (VITAMIN C) 1000 MG tablet Take 1 tablet by mouth daily.   aspirin  81 MG chewable tablet Chew 81 mg by mouth daily.   benzonatate  (TESSALON ) 100 MG capsule Take 1 capsule (100 mg total) by mouth 2 (two) times daily as needed for cough.   BREZTRI  AEROSPHERE 160-9-4.8 MCG/ACT AERO INHALE 2 PUFFS INTO THE LUNGS IN THE MORNING AND AT BEDTIME   Continuous Glucose Sensor (FREESTYLE LIBRE 3 PLUS SENSOR) MISC by Does not apply route. Change sensor every 15 days.   dapagliflozin  propanediol (FARXIGA ) 10 MG TABS tablet Take 1 tablet (10 mg total) by mouth daily.   DROPLET PEN NEEDLES 32G X 4 MM MISC 4 (four) times  daily.   finasteride  (PROSCAR ) 5 MG tablet TAKE 1 TABLET(5 MG) BY MOUTH DAILY   fluticasone  (FLONASE ) 50 MCG/ACT nasal spray SHAKE LIQUID AND USE 2 SPRAYS IN EACH NOSTRIL DAILY   gabapentin  (NEURONTIN ) 100 MG capsule TAKE 1 CAPSULE(100 MG) BY MOUTH THREE TIMES DAILY   gentamicin ointment (GARAMYCIN) 0.1 % Apply 1 Application topically 3 (three) times daily.   glucose blood (ACCU-CHEK GUIDE) test strip Use as instructed   hydrOXYzine  (ATARAX ) 25 MG tablet Take 1 tablet (25 mg total) by mouth 3 (three) times daily.   insulin  glargine (LANTUS  SOLOSTAR) 100 UNIT/ML Solostar Pen Inject 32 Units into the skin at bedtime. (Patient taking differently: Inject 24 Units into the skin at bedtime.)   insulin  lispro (HUMALOG) 100 UNIT/ML KwikPen Junior 12 units at breakfast, 10 units at lunch and 12 units at supper time (Patient taking differently: 14 units at breakfast, 10 units at lunch and 13 units at supper time)  isosorbide  mononitrate (IMDUR ) 30 MG 24 hr tablet Take 0.5 tablets (15 mg total) by mouth daily. Must schedule appointment with Dr. Gollan for future refills. First Attempt.   KERENDIA 10 MG TABS Take by mouth.   Lactobacillus (PROBIOTIC ACIDOPHILUS PO) Take 1 capsule by mouth daily.    metFORMIN  (GLUCOPHAGE ) 1000 MG tablet TAKE 1 TABLET(1000 MG) BY MOUTH TWICE DAILY   Nebulizers (COMPRESSOR/NEBULIZER) MISC Nebulizer Supplies   nitroGLYCERIN  (NITROSTAT ) 0.4 MG SL tablet Place 1 tablet (0.4 mg total) under the tongue every 5 (five) minutes as needed for chest pain.   omeprazole  (PRILOSEC) 20 MG capsule Take 1 capsule (20 mg total) by mouth daily.   oxybutynin  (DITROPAN -XL) 10 MG 24 hr tablet TAKE 1 TABLET(10 MG) BY MOUTH DAILY   ramipril  (ALTACE ) 5 MG capsule Take 1 capsule (5 mg total) by mouth daily.   rOPINIRole  (REQUIP ) 1 MG tablet Take 1 tablet (1 mg total) by mouth at bedtime.   rosuvastatin  (CRESTOR ) 10 MG tablet TAKE 1 TABLET(10 MG) BY MOUTH DAILY   Spacer/Aero-Holding Chambers  (AEROCHAMBER PLS FLOVU MTHPIECE) DEVI Use as directed   tamsulosin  (FLOMAX ) 0.4 MG CAPS capsule TAKE 2 CAPSULES(0.8 MG) BY MOUTH DAILY   TRULICITY  4.5 MG/0.5ML SOAJ SMARTSIG:4.5 Milligram(s) SUB-Q Once a Week   zolpidem  (AMBIEN ) 10 MG tablet Take 1 tablet (10 mg total) by mouth at bedtime.   Blood Glucose Monitoring Suppl (ACCU-CHEK AVIVA PLUS) w/Device KIT Use to check blood sugar twice a day.  DX E11.9 (Patient not taking: Reported on 10/09/2024)   Ensifentrine  (OHTUVAYRE ) 3 MG/2.5ML SUSP Inhale 2.5 mLs into the lungs in the morning and at bedtime. (Patient not taking: Reported on 10/09/2024)   Facility-Administered Encounter Medications as of 10/09/2024  Medication   sodium chloride  flush (NS) 0.9 % injection 3 mL    History: Past Medical History:  Diagnosis Date   Actinic keratoses    Anxiety    BPH (benign prostatic hyperplasia)    Chronic bronchitis (HCC)    Chronic respiratory failure with hypoxia (HCC)    COPD (chronic obstructive pulmonary disease) (HCC)    Emphysema lung (HCC)    Family history of adverse reaction to anesthesia    Father died during procedure 1989-11-12).  Fluid backed up in his lungs   GERD (gastroesophageal reflux disease)    Hypertension    Insomnia    Iron deficiency anemia    Mixed hyperlipidemia    Oxygen  deficiency    Type 2 diabetes mellitus with diabetic nephropathy [facility])    Past Surgical History:  Procedure Laterality Date   CARDIAC CATHETERIZATION     CATARACT EXTRACTION W/PHACO Right 03/15/2024   Procedure: PHACOEMULSIFICATION, CATARACT, WITH IOL INSERTION 4.85, 00.27.6;  Surgeon: Mittie Gaskin, MD;  Location: [facility];  Service: Ophthalmology;  Laterality: Right;   CATARACT EXTRACTION W/PHACO Left 03/29/2024   Procedure: PHACOEMULSIFICATION, CATARACT, WITH IOL INSERTION, 6.15, 00:30.4;  Surgeon: Mittie Gaskin, MD;  Location: [facility];  Service: Ophthalmology;  Laterality: Left;   CHOLECYSTECTOMY      COLONOSCOPY WITH PROPOFOL  N/A 10/18/2018   Procedure: COLONOSCOPY WITH PROPOFOL ;  Surgeon: Jinny Carmine, MD;  Location: [facility] END[facility];  Service: Endoscopy;  Laterality: N/A;   CORONARY PRESSURE/FFR STUDY N/A 12/29/2022   Procedure: INTRAVASCULAR PRESSURE WIRE/FFR STUDY;  Surgeon: Mady Bruckner, MD;  Location: [facility] IN[facility];  Service: Cardiovascular;  Laterality: N/A;   HERNIA REPAIR     RIGHT/LEFT HEART CATH AND CORONARY ANGIOGRAPHY Bilateral 12/29/2022   Procedure: RIGHT/LEFT HEART CATH AND CORONARY  ANGIOGRAPHY;  Surgeon: Mady Bruckner, MD;  Location: [facility] INVAS[facility] CV LAB;  Service: Cardiovascular;  Laterality: Bilateral;   Family History  Problem Relation Age of Onset   Heart attack Sister    Hypertension Sister    Gout Father    Social History   Occupational History   Occupation: Economist of [employer]    Comment: full time  Tobacco Use   Smoking status: Former    Current packs/day: 0.00    Average packs/day: 1.5 packs/day for 50.0 years (75.0 ttl pk-yrs)    Types: Cigarettes    Start date: 09/29/1958    Quit date: 09/29/2008    Years since quitting: 16.0   Smokeless tobacco: Never  Vaping Use   Vaping status: Never Used  Substance and Sexual Activity   Alcohol use: Not Currently    Comment: 2 drinks a month / cocktail   Drug use: No   Sexual activity: Not Currently    Partners: Female    Birth control/protection: None   Tobacco Counseling Counseling given: Not Answered  SDOH Screenings   Food Insecurity: No Food Insecurity (10/09/2024)  Housing: Unknown (10/09/2024)  Transportation Needs: No Transportation Needs (10/09/2024)  Utilities: Not At Risk (10/09/2024)  Alcohol Screen: Low Risk (08/13/2024)  Depression (PHQ2-9): Low Risk (10/09/2024)  Financial Resource Strain: Low Risk  (10/08/2024)   Received from [facility]  Physical Activity: Inactive (10/09/2024)  Social Connections: Moderately Isolated (10/09/2024)  Stress: No  Stress Concern Present (10/09/2024)  Tobacco Use: Medium Risk (10/09/2024)  Health Literacy: Adequate Health Literacy (10/09/2024)   See flowsheets for full screening details  Depression Screen PHQ 2 & 9 Depression Scale- Over the past 2 weeks, how often have you been bothered by any of the following problems? Little interest or pleasure in doing things: 0 Feeling down, depressed, or hopeless (PHQ Adolescent also includes...irritable): 0 PHQ-2 Total Score: 0 Trouble falling or staying asleep, or sleeping too much: 0 Feeling tired or having little energy: 0 Poor appetite or overeating (PHQ Adolescent also includes...weight loss): 0 Feeling bad about yourself - or that you are a failure or have let yourself or your family down: 0 Trouble concentrating on things, such as reading the newspaper or watching television (PHQ Adolescent also includes...like school work): 0 Moving or speaking so slowly that other people could have noticed. Or the opposite - being so fidgety or restless that you have been moving around a lot more than usual: 0 Thoughts that you would be better off dead, or of hurting yourself in some way: 0 PHQ-9 Total Score: 0 If you checked off any problems, how difficult have these problems made it for you to do your work, take care of things at home, or get along with other people?: Not difficult at all  Depression Treatment Depression Interventions/Treatment : EYV7-0 Score <4 Follow-up Not Indicated     Goals Addressed             This Visit's Progress    Patient Stated       Would like to lose 5-10 lbs and do more exercising/2025             Objective:    Today's Vitals   10/09/24 0928  Weight: 182 lb (82.6 kg)  Height: 5' 4 (1.626 m)   Body mass index is 31.24 kg/m.  Hearing/Vision screen Hearing Screening - Comments:: Denies hearing difficulties   Vision Screening - Comments:: Wears eyeglasses/[facility]/UTD Immunizations and Health  Maintenance Health  Maintenance  Topic Date Due   COVID-19 Vaccine (6 - 2025-26 season) 06/19/2024   FOOT EXAM  02/14/2025   OPHTHALMOLOGY EXAM  02/14/2025   HEMOGLOBIN A1C  02/19/2025   Diabetic kidney evaluation - eGFR measurement  08/22/2025   Diabetic kidney evaluation - Urine ACR  08/22/2025   Medicare Annual Wellness (AWV)  10/09/2025   DTaP/Tdap/Td (3 - Td or Tdap) 02/24/2026   Colonoscopy  10/18/2028   Pneumococcal Vaccine: 50+ Years  Completed   Influenza Vaccine  Completed   Hepatitis C Screening  Completed   Zoster Vaccines- Shingrix  Completed   Meningococcal B Vaccine  Aged Out   Lung Cancer Screening  Discontinued        Assessment/Plan:  This is a routine wellness examination for Landyn.  Patient Care Team: Myrla Jon HERO, MD as PCP - General (Family Medicine) Rumalda Nancyann Bosworth, MD as Referring Physician (Endocrinology) Lucienne Tinnie KATHEE DELENA, MD as Referring Physician (Dermatology) Mittie Gaskin, MD as Referring Physician (Ophthalmology) Tamea Dedra CROME, MD as Consulting Physician (Pulmonary Disease)  I have personally reviewed and noted the following in the patients chart:   Medical and social history Use of alcohol, tobacco or illicit drugs  Current medications and supplements including opioid prescriptions. Functional ability and status Nutritional status Physical activity Advanced directives List of other physicians Hospitalizations, surgeries, and ER visits in previous 12 months Vitals Screenings to include cognitive, depression, and falls Referrals and appointments  No orders of the defined types were placed in this encounter.  In addition, I have reviewed and discussed with patient certain preventive protocols, quality metrics, and best practice recommendations. A written personalized care plan for preventive services as well as general preventive health recommendations were provided to patient.   Brenda Cowher L Gwenyth Dingee,  CMA   10/09/2024   Return in 1 year (on 10/09/2025).  After Visit Summary: (MyChart) Due to this being a telephonic visit, the after visit summary with patients personalized plan was offered to patient via MyChart   Nurse Notes: No voiced or noted concerns at this time "

## 2024-10-09 NOTE — Patient Instructions (Addendum)
 [name],  Thank you for taking the time for your Medicare Wellness Visit. I appreciate your continued commitment to your health goals. Please review the care plan we discussed, and feel free to reach out if I can assist you further.  Please note that Annual Wellness Visits do not include a physical exam. Some assessments may be limited, especially if the visit was conducted virtually. If needed, we may recommend an in-person follow-up with your provider.  Ongoing Care Seeing your primary care provider every 3 to 6 months helps us  monitor your health and provide consistent, personalized care. Next office visit on 02/20/2025.  Aim for 30 minutes of exercise or brisk walking, 6-8 glasses of water, and 5 servings of fruits and vegetables each day.   Referrals If a referral was made during today's visit and you haven't received any updates within two weeks, please contact the referred provider directly to check on the status.  Recommended Screenings:  Health Maintenance  Topic Date Due   COVID-19 Vaccine (6 - 2025-26 season) 06/19/2024   Complete foot exam   02/14/2025   Eye exam for diabetics  02/14/2025   Hemoglobin A1C  02/19/2025   Yearly kidney function blood test for diabetes  08/22/2025   Yearly kidney health urinalysis for diabetes  08/22/2025   Medicare Annual Wellness Visit  10/09/2025   DTaP/Tdap/Td vaccine (3 - Td or Tdap) 02/24/2026   Colon Cancer Screening  10/18/2028   Pneumococcal Vaccine for age over 24  Completed   Flu Shot  Completed   Hepatitis C Screening  Completed   Zoster (Shingles) Vaccine  Completed   Meningitis B Vaccine  Aged Out   Screening for Lung Cancer  Discontinued       10/06/2024    1:07 PM  Advanced Directives  Does Patient Have a Medical Advance Directive? Yes  Type of Estate Agent of [place];Living will;Out of facility DNR (pink MOST or yellow form)  Copy of Healthcare Power of Attorney in Chart? No - copy requested     Vision: Annual vision screenings are recommended for early detection of glaucoma, cataracts, and diabetic retinopathy. These exams can also reveal signs of chronic conditions such as diabetes and high blood pressure.  Dental: Annual dental screenings help detect early signs of oral cancer, gum disease, and other conditions linked to overall health, including heart disease and diabetes.  Please see the attached documents for additional preventive care recommendations.

## 2024-10-16 ENCOUNTER — Telehealth: Payer: Self-pay | Admitting: Family Medicine

## 2024-10-16 NOTE — Telephone Encounter (Signed)
 [facility] faxed refill request for the following medications:  gentamicin ointment (GARAMYCIN) 0.1 %     Please advise.

## 2024-10-17 MED ORDER — GENTAMICIN SULFATE 0.1 % EX OINT: 1.0000 | TOPICAL_OINTMENT | Freq: Three times a day (TID) | CUTANEOUS | 5 refills | Status: AC

## 2024-11-03 ENCOUNTER — Telehealth: Payer: Self-pay

## 2024-11-03 ENCOUNTER — Other Ambulatory Visit: Payer: Self-pay | Admitting: Family Medicine

## 2024-11-03 DIAGNOSIS — E1159 Type 2 diabetes mellitus with other circulatory complications: Secondary | ICD-10-CM

## 2024-11-03 DIAGNOSIS — I152 Hypertension secondary to endocrine disorders: Secondary | ICD-10-CM

## 2024-11-03 NOTE — Telephone Encounter (Signed)
 Copied from CRM [id number]. Topic: Clinical - Prescription Issue >> Nov 03, 2024 12:15 PM Gustabo D wrote: ramipril  (ALTACE ) 5 MG capsule- pt says his dosage is wrong and says the pharmacy sent a request. He is needing a 90 day supply He says the pharmacy is saying he's only to take one a day - Ramipril  5 MG TAKE 2 CAPSULES BY MOUTH EVERY MORNING AND 1 CAPSULE EVERY NIGHT AT BEDTIME >> Nov 03, 2024 12:17 PM Gustabo D wrote: Pt is needing it filled today he is almost out

## 2024-11-04 ENCOUNTER — Encounter: Payer: Self-pay | Admitting: Family Medicine

## 2024-11-06 MED ORDER — RAMIPRIL 5 MG PO CAPS: ORAL_CAPSULE | ORAL | 3 refills | Status: AC

## 2024-11-07 ENCOUNTER — Other Ambulatory Visit: Payer: Self-pay | Admitting: Family Medicine

## 2024-11-07 ENCOUNTER — Telehealth: Payer: Self-pay | Admitting: Family Medicine

## 2024-11-07 DIAGNOSIS — N138 Other obstructive and reflux uropathy: Secondary | ICD-10-CM

## 2024-11-07 DIAGNOSIS — E1159 Type 2 diabetes mellitus with other circulatory complications: Secondary | ICD-10-CM

## 2024-11-07 MED ORDER — TAMSULOSIN HCL 0.4 MG PO CAPS: 0.8000 mg | ORAL_CAPSULE | Freq: Every day | ORAL | 3 refills | Status: AC

## 2024-11-07 NOTE — Telephone Encounter (Signed)
[facility] faxed refill request for the following medications:  tamsulosin (FLOMAX) 0.4 MG CAPS capsule   Please advise.

## 2024-11-07 NOTE — Telephone Encounter (Signed)
 LOV 08/22/24 NOV 02/20/25 LRF 08/11/24 qty90 r0 finasteride  LRF 08/11/24 qty90 r0 oxybutynin  LABS 08/22/24

## 2024-11-15 ENCOUNTER — Other Ambulatory Visit: Payer: Self-pay | Admitting: Pulmonary Disease

## 2024-11-15 DIAGNOSIS — J418 Mixed simple and mucopurulent chronic bronchitis: Secondary | ICD-10-CM

## 2025-01-25 ENCOUNTER — Ambulatory Visit: Admitting: Pulmonary Disease

## 2025-02-20 ENCOUNTER — Ambulatory Visit: Admitting: Family Medicine

## 2025-03-27 ENCOUNTER — Ambulatory Visit: Admitting: Cardiology
